# Patient Record
Sex: Female | Born: 1935 | Race: White | Hispanic: No | Marital: Married | Smoking: Former smoker
Health system: Southern US, Community
[De-identification: ages and names within clinical notes are randomized; demographics above are authoritative.]

## PROBLEM LIST (undated history)

## (undated) DIAGNOSIS — I639 Cerebral infarction, unspecified: Secondary | ICD-10-CM

## (undated) DIAGNOSIS — M25512 Pain in left shoulder: Secondary | ICD-10-CM

## (undated) DIAGNOSIS — I779 Disorder of arteries and arterioles, unspecified: Secondary | ICD-10-CM

## (undated) DIAGNOSIS — I10 Essential (primary) hypertension: Secondary | ICD-10-CM

## (undated) DIAGNOSIS — R7301 Impaired fasting glucose: Secondary | ICD-10-CM

## (undated) DIAGNOSIS — Z7901 Long term (current) use of anticoagulants: Secondary | ICD-10-CM

## (undated) DIAGNOSIS — R0600 Dyspnea, unspecified: Secondary | ICD-10-CM

## (undated) DIAGNOSIS — R296 Repeated falls: Secondary | ICD-10-CM

## (undated) DIAGNOSIS — K449 Diaphragmatic hernia without obstruction or gangrene: Secondary | ICD-10-CM

## (undated) DIAGNOSIS — M25511 Pain in right shoulder: Secondary | ICD-10-CM

## (undated) DIAGNOSIS — I38 Endocarditis, valve unspecified: Secondary | ICD-10-CM

## (undated) DIAGNOSIS — R55 Syncope and collapse: Secondary | ICD-10-CM

## (undated) DIAGNOSIS — I700 Atherosclerosis of aorta: Secondary | ICD-10-CM

## (undated) DIAGNOSIS — E785 Hyperlipidemia, unspecified: Secondary | ICD-10-CM

## (undated) DIAGNOSIS — R0609 Other forms of dyspnea: Secondary | ICD-10-CM

## (undated) DIAGNOSIS — K5900 Constipation, unspecified: Secondary | ICD-10-CM

## (undated) DIAGNOSIS — E042 Nontoxic multinodular goiter: Secondary | ICD-10-CM

## (undated) DIAGNOSIS — I8393 Asymptomatic varicose veins of bilateral lower extremities: Secondary | ICD-10-CM

## (undated) DIAGNOSIS — I2510 Atherosclerotic heart disease of native coronary artery without angina pectoris: Secondary | ICD-10-CM

## (undated) DIAGNOSIS — K219 Gastro-esophageal reflux disease without esophagitis: Secondary | ICD-10-CM

## (undated) DIAGNOSIS — Z8719 Personal history of other diseases of the digestive system: Secondary | ICD-10-CM

## (undated) DIAGNOSIS — I4891 Unspecified atrial fibrillation: Secondary | ICD-10-CM

## (undated) DIAGNOSIS — K635 Polyp of colon: Secondary | ICD-10-CM

## (undated) DIAGNOSIS — F32A Depression, unspecified: Secondary | ICD-10-CM

## (undated) DIAGNOSIS — M1990 Unspecified osteoarthritis, unspecified site: Secondary | ICD-10-CM

## (undated) DIAGNOSIS — W19XXXA Unspecified fall, initial encounter: Secondary | ICD-10-CM

## (undated) HISTORY — PX: TONSILLECTOMY: SUR1361

## (undated) HISTORY — PX: COLONOSCOPY: SHX174

## (undated) HISTORY — PX: TUMOR REMOVAL: SHX12

---

## 2004-11-13 ENCOUNTER — Ambulatory Visit: Payer: Self-pay | Admitting: Obstetrics and Gynecology

## 2004-11-19 ENCOUNTER — Ambulatory Visit: Payer: Self-pay | Admitting: Obstetrics and Gynecology

## 2004-12-25 ENCOUNTER — Ambulatory Visit: Payer: Self-pay | Admitting: Internal Medicine

## 2005-12-05 ENCOUNTER — Ambulatory Visit: Payer: Self-pay | Admitting: Internal Medicine

## 2007-09-03 ENCOUNTER — Ambulatory Visit: Payer: Self-pay | Admitting: Internal Medicine

## 2007-09-08 ENCOUNTER — Ambulatory Visit: Payer: Self-pay | Admitting: Internal Medicine

## 2007-10-01 ENCOUNTER — Ambulatory Visit: Payer: Self-pay | Admitting: Gastroenterology

## 2007-10-20 ENCOUNTER — Ambulatory Visit: Payer: Self-pay | Admitting: Gastroenterology

## 2010-10-18 ENCOUNTER — Inpatient Hospital Stay: Payer: Self-pay | Admitting: Internal Medicine

## 2014-02-15 DIAGNOSIS — K219 Gastro-esophageal reflux disease without esophagitis: Secondary | ICD-10-CM | POA: Insufficient documentation

## 2014-05-04 ENCOUNTER — Emergency Department: Payer: Self-pay | Admitting: Emergency Medicine

## 2014-05-04 LAB — COMPREHENSIVE METABOLIC PANEL
ALT: 28 U/L
Albumin: 3.2 g/dL — ABNORMAL LOW (ref 3.4–5.0)
Alkaline Phosphatase: 97 U/L
Anion Gap: 11 (ref 7–16)
BUN: 17 mg/dL (ref 7–18)
Bilirubin,Total: 0.2 mg/dL (ref 0.2–1.0)
CALCIUM: 8.2 mg/dL — AB (ref 8.5–10.1)
CHLORIDE: 99 mmol/L (ref 98–107)
Co2: 27 mmol/L (ref 21–32)
Creatinine: 0.86 mg/dL (ref 0.60–1.30)
GLUCOSE: 118 mg/dL — AB (ref 65–99)
Osmolality: 276 (ref 275–301)
Potassium: 3.5 mmol/L (ref 3.5–5.1)
SGOT(AST): 24 U/L (ref 15–37)
Sodium: 137 mmol/L (ref 136–145)
TOTAL PROTEIN: 5.9 g/dL — AB (ref 6.4–8.2)

## 2014-05-04 LAB — CBC WITH DIFFERENTIAL/PLATELET
Basophil #: 0 10*3/uL (ref 0.0–0.1)
Basophil %: 0.7 %
Eosinophil #: 0.1 10*3/uL (ref 0.0–0.7)
Eosinophil %: 1.8 %
HCT: 31.4 % — ABNORMAL LOW (ref 35.0–47.0)
HGB: 10.7 g/dL — ABNORMAL LOW (ref 12.0–16.0)
LYMPHS PCT: 38.1 %
Lymphocyte #: 1.8 10*3/uL (ref 1.0–3.6)
MCH: 29.5 pg (ref 26.0–34.0)
MCHC: 34 g/dL (ref 32.0–36.0)
MCV: 87 fL (ref 80–100)
MONO ABS: 0.5 x10 3/mm (ref 0.2–0.9)
Monocyte %: 11.1 %
Neutrophil #: 2.2 10*3/uL (ref 1.4–6.5)
Neutrophil %: 48.3 %
Platelet: 206 10*3/uL (ref 150–440)
RBC: 3.61 10*6/uL — ABNORMAL LOW (ref 3.80–5.20)
RDW: 12.8 % (ref 11.5–14.5)
WBC: 4.7 10*3/uL (ref 3.6–11.0)

## 2014-05-04 LAB — PROTIME-INR
INR: 1
Prothrombin Time: 12.9 secs (ref 11.5–14.7)

## 2014-05-04 LAB — TROPONIN I
TROPONIN-I: 0.07 ng/mL — AB
Troponin-I: 0.07 ng/mL — ABNORMAL HIGH

## 2016-03-16 DIAGNOSIS — R55 Syncope and collapse: Secondary | ICD-10-CM

## 2016-03-16 HISTORY — DX: Syncope and collapse: R55

## 2016-03-31 ENCOUNTER — Emergency Department: Payer: Medicare Other

## 2016-03-31 ENCOUNTER — Observation Stay
Admission: EM | Admit: 2016-03-31 | Discharge: 2016-04-03 | Disposition: A | Discharge status: Home / Self Care | Payer: Medicare Other | Attending: Internal Medicine | Admitting: Internal Medicine

## 2016-03-31 ENCOUNTER — Encounter: Payer: Self-pay | Admitting: Intensive Care

## 2016-03-31 DIAGNOSIS — K449 Diaphragmatic hernia without obstruction or gangrene: Secondary | ICD-10-CM | POA: Insufficient documentation

## 2016-03-31 DIAGNOSIS — M50323 Other cervical disc degeneration at C6-C7 level: Secondary | ICD-10-CM | POA: Diagnosis not present

## 2016-03-31 DIAGNOSIS — S60221A Contusion of right hand, initial encounter: Secondary | ICD-10-CM | POA: Diagnosis not present

## 2016-03-31 DIAGNOSIS — E041 Nontoxic single thyroid nodule: Secondary | ICD-10-CM | POA: Insufficient documentation

## 2016-03-31 DIAGNOSIS — K219 Gastro-esophageal reflux disease without esophagitis: Secondary | ICD-10-CM | POA: Diagnosis not present

## 2016-03-31 DIAGNOSIS — Z880 Allergy status to penicillin: Secondary | ICD-10-CM | POA: Diagnosis not present

## 2016-03-31 DIAGNOSIS — S0990XA Unspecified injury of head, initial encounter: Secondary | ICD-10-CM

## 2016-03-31 DIAGNOSIS — R55 Syncope and collapse: Secondary | ICD-10-CM | POA: Diagnosis present

## 2016-03-31 DIAGNOSIS — R4701 Aphasia: Secondary | ICD-10-CM | POA: Diagnosis not present

## 2016-03-31 DIAGNOSIS — M8580 Other specified disorders of bone density and structure, unspecified site: Secondary | ICD-10-CM | POA: Diagnosis not present

## 2016-03-31 DIAGNOSIS — I160 Hypertensive urgency: Secondary | ICD-10-CM | POA: Diagnosis present

## 2016-03-31 DIAGNOSIS — M50321 Other cervical disc degeneration at C4-C5 level: Secondary | ICD-10-CM | POA: Insufficient documentation

## 2016-03-31 DIAGNOSIS — Z888 Allergy status to other drugs, medicaments and biological substances status: Secondary | ICD-10-CM | POA: Diagnosis not present

## 2016-03-31 DIAGNOSIS — S0181XA Laceration without foreign body of other part of head, initial encounter: Secondary | ICD-10-CM

## 2016-03-31 DIAGNOSIS — W19XXXA Unspecified fall, initial encounter: Secondary | ICD-10-CM | POA: Insufficient documentation

## 2016-03-31 DIAGNOSIS — Y939 Activity, unspecified: Secondary | ICD-10-CM | POA: Insufficient documentation

## 2016-03-31 DIAGNOSIS — I517 Cardiomegaly: Secondary | ICD-10-CM | POA: Insufficient documentation

## 2016-03-31 DIAGNOSIS — M50322 Other cervical disc degeneration at C5-C6 level: Secondary | ICD-10-CM | POA: Diagnosis not present

## 2016-03-31 DIAGNOSIS — I959 Hypotension, unspecified: Secondary | ICD-10-CM | POA: Diagnosis present

## 2016-03-31 DIAGNOSIS — J320 Chronic maxillary sinusitis: Secondary | ICD-10-CM | POA: Insufficient documentation

## 2016-03-31 DIAGNOSIS — Y9289 Other specified places as the place of occurrence of the external cause: Secondary | ICD-10-CM | POA: Insufficient documentation

## 2016-03-31 DIAGNOSIS — M19012 Primary osteoarthritis, left shoulder: Secondary | ICD-10-CM | POA: Insufficient documentation

## 2016-03-31 HISTORY — DX: Essential (primary) hypertension: I10

## 2016-03-31 HISTORY — DX: Gastro-esophageal reflux disease without esophagitis: K21.9

## 2016-03-31 LAB — BASIC METABOLIC PANEL
ANION GAP: 7 (ref 5–15)
BUN: 19 mg/dL (ref 6–20)
CALCIUM: 9.3 mg/dL (ref 8.9–10.3)
CO2: 25 mmol/L (ref 22–32)
CREATININE: 0.58 mg/dL (ref 0.44–1.00)
Chloride: 102 mmol/L (ref 101–111)
GLUCOSE: 100 mg/dL — AB (ref 65–99)
Potassium: 3.6 mmol/L (ref 3.5–5.1)
Sodium: 134 mmol/L — ABNORMAL LOW (ref 135–145)

## 2016-03-31 LAB — CBC
HCT: 35.1 % (ref 35.0–47.0)
Hemoglobin: 12.3 g/dL (ref 12.0–16.0)
MCH: 29.9 pg (ref 26.0–34.0)
MCHC: 35.1 g/dL (ref 32.0–36.0)
MCV: 85.2 fL (ref 80.0–100.0)
PLATELETS: 209 10*3/uL (ref 150–440)
RBC: 4.12 MIL/uL (ref 3.80–5.20)
RDW: 13 % (ref 11.5–14.5)
WBC: 7.1 10*3/uL (ref 3.6–11.0)

## 2016-03-31 LAB — TROPONIN I
TROPONIN I: 0.03 ng/mL — AB (ref <0.03)
Troponin I: <0.03 ng/mL (ref <0.03)

## 2016-03-31 MED ORDER — HYDRALAZINE HCL 10 MG PO TABS
10.0000 mg | ORAL_TABLET | Freq: Three times a day (TID) | ORAL | Status: DC
  Administered 2016-03-31 (×2): 10 mg via ORAL
  Filled 2016-03-31 (×3): qty 1

## 2016-03-31 MED ORDER — SODIUM CHLORIDE 0.9% FLUSH
3.0000 mL | Freq: Two times a day (BID) | INTRAVENOUS | Status: DC
  Administered 2016-03-31 – 2016-04-03 (×6): 3 mL via INTRAVENOUS

## 2016-03-31 MED ORDER — ENOXAPARIN SODIUM 40 MG/0.4ML ~~LOC~~ SOLN
40.0000 mg | SUBCUTANEOUS | Status: DC
  Administered 2016-03-31 – 2016-04-02 (×3): 40 mg via SUBCUTANEOUS
  Filled 2016-03-31 (×3): qty 0.4

## 2016-03-31 MED ORDER — LISINOPRIL 20 MG PO TABS
40.0000 mg | ORAL_TABLET | Freq: Every day | ORAL | Status: DC
  Administered 2016-04-01 – 2016-04-03 (×3): 40 mg via ORAL
  Filled 2016-03-31 (×3): qty 2

## 2016-03-31 MED ORDER — TETANUS-DIPHTH-ACELL PERTUSSIS 5-2.5-18.5 LF-MCG/0.5 IM SUSP
0.5000 mL | Freq: Once | INTRAMUSCULAR | Status: AC
  Administered 2016-03-31: 0.5 mL via INTRAMUSCULAR
  Filled 2016-03-31: qty 0.5

## 2016-03-31 MED ORDER — LIDOCAINE-EPINEPHRINE 2 %-1:100000 IJ SOLN
INTRAMUSCULAR | Status: AC
  Administered 2016-03-31: 15:00:00
  Filled 2016-03-31: qty 1.7

## 2016-03-31 MED ORDER — ACETAMINOPHEN 325 MG PO TABS
650.0000 mg | ORAL_TABLET | Freq: Four times a day (QID) | ORAL | Status: DC | PRN
  Administered 2016-03-31 – 2016-04-02 (×7): 650 mg via ORAL
  Filled 2016-03-31 (×7): qty 2

## 2016-03-31 MED ORDER — ASPIRIN 81 MG PO CHEW
324.0000 mg | CHEWABLE_TABLET | Freq: Once | ORAL | Status: AC
  Administered 2016-03-31: 324 mg via ORAL
  Filled 2016-03-31: qty 4

## 2016-03-31 MED ORDER — OXYCODONE HCL 5 MG PO TABS: 5.0000 mg | ORAL_TABLET | ORAL | Status: DC | PRN

## 2016-03-31 MED ORDER — HYDRALAZINE HCL 20 MG/ML IJ SOLN
10.0000 mg | Freq: Once | INTRAMUSCULAR | Status: AC
  Administered 2016-03-31: 10 mg via INTRAVENOUS
  Filled 2016-03-31: qty 1

## 2016-03-31 MED ORDER — ATENOLOL 25 MG PO TABS
50.0000 mg | ORAL_TABLET | Freq: Every day | ORAL | Status: DC
  Administered 2016-04-01: 50 mg via ORAL
  Filled 2016-03-31: qty 2

## 2016-03-31 MED ORDER — ONDANSETRON HCL 4 MG PO TABS: 4.0000 mg | ORAL_TABLET | Freq: Four times a day (QID) | ORAL | Status: DC | PRN

## 2016-03-31 MED ORDER — LABETALOL HCL 5 MG/ML IV SOLN: 5.0000 mg | INTRAVENOUS | Status: DC | PRN

## 2016-03-31 MED ORDER — SODIUM CHLORIDE 0.9 % IV BOLUS (SEPSIS)
1000.0000 mL | Freq: Once | INTRAVENOUS | Status: AC
  Administered 2016-03-31: 1000 mL via INTRAVENOUS

## 2016-03-31 MED ORDER — LISINOPRIL 10 MG PO TABS
20.0000 mg | ORAL_TABLET | Freq: Once | ORAL | Status: AC
  Administered 2016-03-31: 20 mg via ORAL
  Filled 2016-03-31: qty 2

## 2016-03-31 MED ORDER — ONDANSETRON HCL 4 MG/2ML IJ SOLN: 4.0000 mg | Freq: Four times a day (QID) | INTRAMUSCULAR | Status: DC | PRN

## 2016-03-31 MED ORDER — LIDOCAINE-EPINEPHRINE (PF) 1 %-1:200000 IJ SOLN
INTRAMUSCULAR | Status: AC
  Administered 2016-03-31: 15:00:00
  Filled 2016-03-31: qty 30

## 2016-03-31 MED ORDER — SODIUM CHLORIDE 0.9 % IV SOLN
INTRAVENOUS | Status: DC
  Administered 2016-03-31 – 2016-04-01 (×2): via INTRAVENOUS

## 2016-03-31 NOTE — Care Management Obs Status (Signed)
MEDICARE OBSERVATION STATUS NOTIFICATION   Patient Details  Name: Debbie BalesCarol Dahmer MRN: [id number] Date of Birth: Apr 19, 1932   Medicare Observation Status Notification Given:  Yes    Caren MacadamMichelle Ilee Randleman, RN 03/31/2016, 4:52 PM

## 2016-03-31 NOTE — ED Notes (Addendum)
Patient [place] by EMS from church. Patient was at church, slipped and fell, landing on her L arm and chin. Patient c/o L arm pain, chin pain, and R thumb pain. Patient is A& O x4. EMS vitals 243/105 b/p, 125 CBG

## 2016-03-31 NOTE — H&P (Signed)
[facility] [place] [facility]   PATIENT NAME: Debbie Hampton    MR#:  [id number]  DATE OF BIRTH:  Feb 01, 1932   DATE OF ADMISSION:  03/31/2016  PRIMARY CARE PHYSICIAN: BABAOFF, Lavada Mesi, MD   REQUESTING/REFERRING PHYSICIAN: Quale  CHIEF COMPLAINT:   Chief Complaint  Patient presents with  . Fall  . Hypertension    HISTORY OF PRESENT ILLNESS:  Debbie Hampton  is a 82 y.o. female with a known history of Essential hypertension stating Baseline blood pressure SBP 140 who is presenting after fall. She states that she was at church on suffered a mechanical fall hitting the left side of her chin causing a laceration thus presented to Hospital further workup and evaluation. On EMS arrival noted marked hypertension with systolic blood pressure in the 260 range. Her blood pressure remained elevated in the emergency department where she received 10 mg IV hydralazine in addition to 20 mg oral lisinopril (in addition to all of her home medications taken this morning at 7 AM) her blood pressure plummeted to a systolic pressure of 80. With wild blood pressure swings the patient developed transient aphasia. Now improved.   PAST MEDICAL HISTORY:   Past Medical History  Diagnosis Date  . Hypertension   . GERD (gastroesophageal reflux disease)     PAST SURGICAL HISTORY:   Past Surgical History  Procedure Laterality Date  . Tonsillectomy    . Tumor removal      ovaries    SOCIAL HISTORY:   Social History  Substance Use Topics  . Smoking status: Never Smoker   . Smokeless tobacco: Never Used  . Alcohol Use: No    FAMILY HISTORY:   Family History  Problem Relation Age of Onset  . Hypertension Other     DRUG ALLERGIES:   Allergies  Allergen Reactions  . Aloe   . Penicillins     REVIEW OF SYSTEMS:  REVIEW OF SYSTEMS:  CONSTITUTIONAL: Denies fevers, chills, fatigue, weakness.  EYES: Denies blurred vision, double vision, or eye pain.  EARS, NOSE, THROAT:  Denies tinnitus, ear pain, hearing loss.  RESPIRATORY: denies cough, shortness of breath, wheezing  CARDIOVASCULAR: Denies chest pain, palpitations, edema.  GASTROINTESTINAL: Denies nausea, vomiting, diarrhea, abdominal pain.  GENITOURINARY: Denies dysuria, hematuria.  ENDOCRINE: Denies nocturia or thyroid problems. HEMATOLOGIC AND LYMPHATIC: Denies easy bruising or bleeding.  SKIN: Denies rash or lesions.  MUSCULOSKELETAL: Denies pain in neck, back, shoulder, knees, hips, or further arthritic symptoms.  NEUROLOGIC: Denies paralysis, paresthesias.  PSYCHIATRIC: Denies anxiety or depressive symptoms. Otherwise full review of systems performed by me is negative.   MEDICATIONS AT HOME:   Prior to Admission medications   Medication Sig Start Date End Date Taking? Authorizing Provider  atenolol (TENORMIN) 50 MG tablet Take 50 mg by mouth daily. 02/22/16  Yes Historical Provider, MD  hydrALAZINE (APRESOLINE) 10 MG tablet Take 10 mg by mouth 3 (three) times daily. 03/23/16  Yes Historical Provider, MD  lisinopril (PRINIVIL,ZESTRIL) 40 MG tablet Take 40 mg by mouth daily. 02/22/16  Yes Historical Provider, MD      VITAL SIGNS:  Blood pressure 207/78, pulse 64, temperature 97.9 F (36.6 C), temperature source Oral, resp. rate 27, height  (1.575 m), weight 134 lb (60.782 kg), SpO2 100 %.  PHYSICAL EXAMINATION:  VITAL SIGNS: Filed Vitals:   03/31/16 1515 03/31/16 1530  BP: 202/72 207/78  Pulse:    Temp:    Resp: 22 27   GENERAL:83 y.o.female currently in  no acute distress.  HEAD: Normocephalic, atraumatic.  EYES: Pupils equal, round, reactive to light. Extraocular muscles intact. No scleral icterus.  MOUTH: Moist mucosal membrane. Dentition intact. No abscess noted.  EAR, NOSE, THROAT: Clear without exudates. No external lesions.  NECK: Supple. No thyromegaly. No nodules. No JVD.  PULMONARY: Clear to ascultation, without wheeze rails or rhonci. No use of accessory muscles, Good  respiratory effort. good air entry bilaterally CHEST: Nontender to palpation.  CARDIOVASCULAR: S1 and S2. Regular rate and rhythm. No murmurs, rubs, or gallops. No edema. Pedal pulses 2+ bilaterally.  GASTROINTESTINAL: Soft, nontender, nondistended. No masses. Positive bowel sounds. No hepatosplenomegaly.  MUSCULOSKELETAL: No swelling, clubbing, or edema. Range of motion full in all extremities.  NEUROLOGIC: Cranial nerves II through XII are intact. No gross focal neurological deficits. Sensation intact. Reflexes intact.  SKIN: small left chin laceration post repair, otherwise no ulceration, lesions, rashes, or cyanosis. Skin warm and dry. Turgor intact.  PSYCHIATRIC: Mood, affect within normal limits. The patient is awake, alert and oriented x 3. Insight, judgment intact.    LABORATORY PANEL:   CBC  Recent Labs Lab 03/31/16 1228  WBC 7.1  HGB 12.3  HCT 35.1  PLT 209   ------------------------------------------------------------------------------------------------------------------  Chemistries   Recent Labs Lab 03/31/16 1228  NA 134*  K 3.6  CL 102  CO2 25  GLUCOSE 100*  BUN 19  CREATININE 0.58  CALCIUM 9.3   ------------------------------------------------------------------------------------------------------------------  Cardiac Enzymes  Recent Labs Lab 03/31/16 1228  TROPONINI <0.03   ------------------------------------------------------------------------------------------------------------------  RADIOLOGY:  Dg Chest 2 View  03/31/2016  CLINICAL DATA:  Pt fell this am; now with right hand pain around 1st metacarpal and 1st digit; pt also with c/o left shoulder pain; pt unable to abduct left arm; hx/ HTN and is a nonsmoker EXAM: CHEST  2 VIEW COMPARISON:  10/17/2010 FINDINGS: The heart is mildly enlarged. The lungs are free of focal consolidations and pleural effusions. No pulmonary edema. There is a hiatal hernia. No acute vertebral injury. Chronic changes  noted in both shoulders. IMPRESSION: Cardiomegaly.  No edema. Hiatal hernia. Electronically Signed   By: Norva PavlovElizabeth  Brown M.D.   On: 03/31/2016 14:05   Ct Head Wo Contrast  03/31/2016  CLINICAL DATA:  82 year old female with acute head, face and cervical spine pain following fall today. Initial encounter. EXAM: CT HEAD WITHOUT CONTRAST CT MAXILLOFACIAL WITHOUT CONTRAST CT CERVICAL SPINE WITHOUT CONTRAST TECHNIQUE: Multidetector CT imaging of the head, cervical spine, and maxillofacial structures were performed using the standard protocol without intravenous contrast. Multiplanar CT image reconstructions of the cervical spine and maxillofacial structures were also generated. COMPARISON:  12/25/2004 chest CT FINDINGS: CT HEAD FINDINGS Chronic small-vessel white matter ischemic changes and remote right caudate infarct identified. No acute intracranial abnormalities are identified, including mass lesion or mass effect, hydrocephalus, extra-axial fluid collection, midline shift, hemorrhage, or acute infarction. The visualized bony calvarium is unremarkable. CT MAXILLOFACIAL FINDINGS There is no evidence of acute fracture, subluxation or dislocation. Soft tissue laceration/injury at the chin noted. The paranasal sinuses, mastoid air cells and middle/inner ears are clear except for left maxillary sinus mucosal thickening. Orbits and globes are unremarkable. CT CERVICAL SPINE FINDINGS There is no evidence of acute fracture, subluxation or prevertebral soft tissue swelling. Multilevel degenerative changes throughout the cervical spine noted with moderate degenerative disc disease, spondylosis and facet arthropathy at C3-4, C4-5, and C6-7. No suspicious focal bony lesions are identified. A 2 cm posterior left thyroid nodule is unchanged from 12/25/2004. IMPRESSION: No evidence  of acute intracranial abnormality. Chronic small-vessel white matter ischemic changes and remote right caudate infarct. Chin soft tissue injury  without evidence of facial fracture. No static evidence of acute injury to the cervical spine. Degenerative changes as described. Chronic left maxillary sinus disease/sinusitis. Electronically Signed   By: Harmon Pier M.D.   On: 03/31/2016 13:59   Ct Cervical Spine Wo Contrast  03/31/2016  CLINICAL DATA:  82 year old female with acute head, face and cervical spine pain following fall today. Initial encounter. EXAM: CT HEAD WITHOUT CONTRAST CT MAXILLOFACIAL WITHOUT CONTRAST CT CERVICAL SPINE WITHOUT CONTRAST TECHNIQUE: Multidetector CT imaging of the head, cervical spine, and maxillofacial structures were performed using the standard protocol without intravenous contrast. Multiplanar CT image reconstructions of the cervical spine and maxillofacial structures were also generated. COMPARISON:  12/25/2004 chest CT FINDINGS: CT HEAD FINDINGS Chronic small-vessel white matter ischemic changes and remote right caudate infarct identified. No acute intracranial abnormalities are identified, including mass lesion or mass effect, hydrocephalus, extra-axial fluid collection, midline shift, hemorrhage, or acute infarction. The visualized bony calvarium is unremarkable. CT MAXILLOFACIAL FINDINGS There is no evidence of acute fracture, subluxation or dislocation. Soft tissue laceration/injury at the chin noted. The paranasal sinuses, mastoid air cells and middle/inner ears are clear except for left maxillary sinus mucosal thickening. Orbits and globes are unremarkable. CT CERVICAL SPINE FINDINGS There is no evidence of acute fracture, subluxation or prevertebral soft tissue swelling. Multilevel degenerative changes throughout the cervical spine noted with moderate degenerative disc disease, spondylosis and facet arthropathy at C3-4, C4-5, and C6-7. No suspicious focal bony lesions are identified. A 2 cm posterior left thyroid nodule is unchanged from 12/25/2004. IMPRESSION: No evidence of acute intracranial abnormality. Chronic  small-vessel white matter ischemic changes and remote right caudate infarct. Chin soft tissue injury without evidence of facial fracture. No static evidence of acute injury to the cervical spine. Degenerative changes as described. Chronic left maxillary sinus disease/sinusitis. Electronically Signed   By: Harmon Pier M.D.   On: 03/31/2016 13:59   Dg Shoulder Left  03/31/2016  CLINICAL DATA:  Status post fall.  Left shoulder pain. EXAM: LEFT SHOULDER - 2+ VIEW COMPARISON:  None. FINDINGS: No acute fracture or dislocation. Severe osteoarthritis of the left glenohumeral joint. Loss of the normal acromiohumeral distance as can be seen with a chronic rotator cuff tear. Generalized osteopenia. Mild degenerative changes of the acromioclavicular joint. Slight irregularity of the distal clavicle which is likely projectional. IMPRESSION: 1. Slight irregularity of the distal clavicle which is likely projectional. Correlate with point tenderness. If there is tenderness over this region, recommend a dedicated x-ray of the left clavicle. 2. Otherwise no acute osseous injury of the left shoulder. 3. Severe osteoarthritis of the left glenohumeral joint. Electronically Signed   By: Elige Ko   On: 03/31/2016 14:09   Dg Hand Complete Right  03/31/2016  CLINICAL DATA:  Fall. Right hand pain around first metacarpal bone and first digit. EXAM: RIGHT HAND - COMPLETE 3+ VIEW COMPARISON:  None. FINDINGS: The bones appear diffusely osteopenic. There is no evidence of fracture or dislocation. Moderate osteoarthritis is noted involving the basilar joint. Soft tissues are unremarkable. IMPRESSION: 1. No fractures. 2. Osteopenia. Electronically Signed   By: Signa Kell M.D.   On: 03/31/2016 14:09   Ct Maxillofacial Wo Cm  03/31/2016  CLINICAL DATA:  82 year old female with acute head, face and cervical spine pain following fall today. Initial encounter. EXAM: CT HEAD WITHOUT CONTRAST CT MAXILLOFACIAL WITHOUT CONTRAST CT CERVICAL  SPINE WITHOUT CONTRAST TECHNIQUE: Multidetector CT imaging of the head, cervical spine, and maxillofacial structures were performed using the standard protocol without intravenous contrast. Multiplanar CT image reconstructions of the cervical spine and maxillofacial structures were also generated. COMPARISON:  12/25/2004 chest CT FINDINGS: CT HEAD FINDINGS Chronic small-vessel white matter ischemic changes and remote right caudate infarct identified. No acute intracranial abnormalities are identified, including mass lesion or mass effect, hydrocephalus, extra-axial fluid collection, midline shift, hemorrhage, or acute infarction. The visualized bony calvarium is unremarkable. CT MAXILLOFACIAL FINDINGS There is no evidence of acute fracture, subluxation or dislocation. Soft tissue laceration/injury at the chin noted. The paranasal sinuses, mastoid air cells and middle/inner ears are clear except for left maxillary sinus mucosal thickening. Orbits and globes are unremarkable. CT CERVICAL SPINE FINDINGS There is no evidence of acute fracture, subluxation or prevertebral soft tissue swelling. Multilevel degenerative changes throughout the cervical spine noted with moderate degenerative disc disease, spondylosis and facet arthropathy at C3-4, C4-5, and C6-7. No suspicious focal bony lesions are identified. A 2 cm posterior left thyroid nodule is unchanged from 12/25/2004. IMPRESSION: No evidence of acute intracranial abnormality. Chronic small-vessel white matter ischemic changes and remote right caudate infarct. Chin soft tissue injury without evidence of facial fracture. No static evidence of acute injury to the cervical spine. Degenerative changes as described. Chronic left maxillary sinus disease/sinusitis. Electronically Signed   By: Harmon Pier M.D.   On: 03/31/2016 13:59    EKG:   Orders placed or performed during the hospital encounter of 03/31/16  . ED EKG  . ED EKG  . EKG 12-Lead  . EKG 12-Lead     IMPRESSION AND PLAN:   82 year old Caucasian female history of essential hypertension presenting after mechanical fall and had blood pressure swing with aphasia symptoms  1. Hypertensive urgency: continue home medications, would avoid IV hydralazine , since questionable baseline blood pressure would aim for systolic around 200 2. Near syncope: Secondary to blood pressure changes Place on telemetry, follow blood pressure parameters as above 3. Venous thromboembolism prophylactic: lovenox    All the records are reviewed and case discussed with ED provider. Management plans discussed with the patient, family and they are in agreement.  CODE STATUS: full  TOTAL TIME TAKING CARE OF THIS PATIENT: 33 minutes.    Sharmila Wrobleski,  Mardi Mainland.D on 03/31/2016 at 3:54 PM  Between 7am to 6pm - Pager - [phone]  After 6pm: House Pager: - [phone]  [facility] [place] [facility]  Office  [phone]  CC: Primary care physician; BABAOFF, Lavada Mesi, MD

## 2016-03-31 NOTE — ED Notes (Signed)
Patient back from CT and DG. Patient is experiencing aphasia. MD made aware/ B/p checked once returned and it read 80/45 at 1419. Patient placed in trendelenburg and given a normal saline bolus. Blood pressure at 1424 114/50 and patient is speaking normal again. MD still at bedside

## 2016-03-31 NOTE — ED Notes (Signed)
Patient transported to X-ray 

## 2016-03-31 NOTE — ED Notes (Signed)
MD Hower at bedside. 

## 2016-03-31 NOTE — ED Provider Notes (Signed)
[facility] Emergency Department Provider Note  ____________________________________________  Time seen: Approximately 12:19 PM  I have reviewed the triage vital signs and the nursing notes.   HISTORY  Chief Complaint Fall and Hypertension    HPI Debbie Hampton is a 82 y.o. female presents for evaluation after falling at church. She reports she was walking into church, and then the next thing she knew the "floor came up and hit me". She doesn't recall exactly why, but she thinks she may have tripped up on her shoes. She fell forward and struck her chin.  Denies any chest pain or headache, she does report having some mild tenderness along left side of the neck. She also reports that her right thumb is bruised but able to use it well without any numbness or weakness. No numbness weakness or tingling in her arms or legs.  Race moderate discomfort across the front of the chin with a laceration that bled some but is stopped now.  Does not think her tetanus is up-to-date  Past Medical History  Diagnosis Date  . Hypertension   . GERD (gastroesophageal reflux disease)     Patient Active Problem List   Diagnosis Date Noted  . Syncope, near 03/31/2016    Past Surgical History  Procedure Laterality Date  . Tonsillectomy    . Tumor removal      ovaries    Current Outpatient Rx  Name  Route  Sig  Dispense  Refill  . atenolol (TENORMIN) 50 MG tablet   Oral   Take 50 mg by mouth daily.      3   . hydrALAZINE (APRESOLINE) 10 MG tablet   Oral   Take 10 mg by mouth 3 (three) times daily.      5   . lisinopril (PRINIVIL,ZESTRIL) 40 MG tablet   Oral   Take 40 mg by mouth daily.      3     Allergies Aloe and Penicillins  Family History  Problem Relation Age of Onset  . Hypertension Other     Social History Social History  Substance Use Topics  . Smoking status: Never Smoker   . Smokeless tobacco: Never Used  . Alcohol Use: No    Review  of Systems Constitutional: No fever/chills Eyes: No visual changes. ENT: No sore throat.Does report her chin is sore. No loose teeth. Cardiovascular: Denies chest pain. Respiratory: Denies shortness of breath. Gastrointestinal: No abdominal pain.  No nausea, no vomiting.  No diarrhea.  No constipation. Genitourinary: Negative for dysuria. Musculoskeletal: Negative for back pain. She reports that her right thumb is slightly swollen, but otherwise working well. The outside her left shoulder feels a little bit sore, but she is able to use it well. Skin: Negative for rash. Neurological: Negative for headaches, focal weakness or numbness.  10-point ROS otherwise negative.  ____________________________________________   PHYSICAL EXAM:  VITAL SIGNS: ED Triage Vitals  Enc Vitals Group     BP 03/31/16 1059 263/104 mmHg     Pulse Rate 03/31/16 1059 64     Resp 03/31/16 1059 20     Temp 03/31/16 1059 97.9 F (36.6 C)     Temp Source 03/31/16 1059 Oral     SpO2 03/31/16 1059 98 %     Weight 03/31/16 1059 134 lb (60.782 kg)     Height 03/31/16 1059  (1.575 m)     Head Cir --      Peak Flow --  Pain Score 03/31/16 1103 4     Pain Loc --      Pain Edu? --      Excl. in GC? --    Constitutional: Alert and oriented. Well appearing and in no acute distress.Pleasant. Eyes: Conjunctivae are normal. PERRL. EOMI. Head: Atraumatic except for a proximally 2 cm laceration involving the anterior aspect of the chin, well controlled and clean. No dental injury. Nose: No congestion/rhinnorhea. Mouth/Throat: Mucous membranes are moist.  Oropharynx non-erythematous. Neck: No stridor.  Mild tenderness along the lateral aspect of the left cervical spine, but no midline tenderness noted. No thoracic or lumbar tenderness. No injury noted to the back. Cardiovascular: Normal rate, regular rhythm. Grossly normal heart sounds.  Good peripheral circulation. Respiratory: Normal respiratory effort.  No  retractions. Lungs CTAB. Gastrointestinal: Soft and nontender. No distention. No abdominal bruits. No CVA tenderness.  Lower Extremities  No edema. Normal DP/PT pulses bilateral with good cap refill.  Normal neuro-motor function lower extremities bilateral.  RIGHT Right lower extremity demonstrates normal strength, good use of all muscles. No edema bruising or contusions of the right hip, right knee, right ankle. Full range of motion of the right lower extremity without pain. No pain on axial loading. No evidence of trauma.  LEFT Left lower extremity demonstrates normal strength, good use of all muscles. No edema bruising or contusions of the hip,  knee, ankle. Full range of motion of the left lower extremity without pain. No pain on axial loading. No evidence of trauma.  RIGHT Right upper extremity demonstrates normal strength, good use of all muscles. No edema bruising or contusions of the right shoulder/upper arm, right elbow, right forearm / hand although the first digit on the right hand has mild ecchymoses at the distal tip with no open laceration or wound noted. Full range of motion of the right right upper extremity without pain. No evidence of trauma. Strong radial pulse. Intact median/ulnar/radial neuro-muscular exam.  LEFT Left upper extremity demonstrates normal strength, good use of all muscles. No edema bruising or contusions of the left shoulder/upper arm, left elbow, left forearm / hand. Full range of motion of the left  upper extremity without pain. No evidence of trauma. Strong radial pulse. Intact median/ulnar/radial neuro-muscular exam.   Musculoskeletal: No lower extremity tenderness nor edema.  No joint effusions. Neurologic:  Normal speech and language. No gross focal neurologic deficits are appreciated. No gait instability. Skin:  Skin is warm, dry and intact. No rash noted. Psychiatric: Mood and affect are normal. Speech and behavior are  normal.  ____________________________________________   LABS (all labs ordered are listed, but only abnormal results are displayed)  Labs Reviewed  BASIC METABOLIC PANEL - Abnormal; Notable for the following:    Sodium 134 (*)    Glucose, Bld 100 (*)    All other components within normal limits  TROPONIN I  CBC   ____________________________________________  EKG  Reviewed and interpreted by me at 1500 Normal sinus rhythm, heart rate 65 Appears likely consistent with LVH, there is T-wave abnormality, but likely that of a LVH type pattern. There is no clear evidence ischemic abnormality. PR 60 QTc 450  EKG reviewed and interpreted 11:10 AM Normal sinus rhythm QRS 100 QTc 450 Normal sinus rhythm, probable left ventricular hypertrophy. No clear evidence ischemic abnormality. Of note the patient not have any chest pain or pulmonary symptoms at this time. ____________________________________________  RADIOLOGY   DG Chest 2 View (Final result) Result time: 03/31/16 14:05:56   Final  result by Rad Results In Interface (03/31/16 14:05:56)   Narrative:   CLINICAL DATA: Pt fell this am; now with right hand pain around 1st metacarpal and 1st digit; pt also with c/o left shoulder pain; pt unable to abduct left arm; hx/ HTN and is a nonsmoker  EXAM: CHEST 2 VIEW  COMPARISON: 10/17/2010  FINDINGS: The heart is mildly enlarged. The lungs are free of focal consolidations and pleural effusions. No pulmonary edema. There is a hiatal hernia. No acute vertebral injury. Chronic changes noted in both shoulders.  IMPRESSION: Cardiomegaly. No edema.  Hiatal hernia.   Electronically Signed By: Norva Pavlov M.D. On: 03/31/2016 14:05          DG Shoulder Left (Final result) Result time: 03/31/16 14:09:04   Final result by Rad Results In Interface (03/31/16 14:09:04)   Narrative:   CLINICAL DATA: Status post fall. Left shoulder pain.  EXAM: LEFT SHOULDER  - 2+ VIEW  COMPARISON: None.  FINDINGS: No acute fracture or dislocation. Severe osteoarthritis of the left glenohumeral joint. Loss of the normal acromiohumeral distance as can be seen with a chronic rotator cuff tear.  Generalized osteopenia.  Mild degenerative changes of the acromioclavicular joint. Slight irregularity of the distal clavicle which is likely projectional.  IMPRESSION: 1. Slight irregularity of the distal clavicle which is likely projectional. Correlate with point tenderness. If there is tenderness over this region, recommend a dedicated x-ray of the left clavicle. 2. Otherwise no acute osseous injury of the left shoulder. 3. Severe osteoarthritis of the left glenohumeral joint.   Electronically Signed By: Elige Ko On: 03/31/2016 14:09          DG Hand Complete Right (Final result) Result time: 03/31/16 14:09:12   Final result by Rad Results In Interface (03/31/16 14:09:12)   Narrative:   CLINICAL DATA: Fall. Right hand pain around first metacarpal bone and first digit.  EXAM: RIGHT HAND - COMPLETE 3+ VIEW  COMPARISON: None.  FINDINGS: The bones appear diffusely osteopenic. There is no evidence of fracture or dislocation. Moderate osteoarthritis is noted involving the basilar joint. Soft tissues are unremarkable.  IMPRESSION: 1. No fractures. 2. Osteopenia.   Electronically Signed By: Signa Kell M.D. On: 03/31/2016 14:09          CT Head Wo Contrast (Final result) Result time: 03/31/16 13:59:55   Final result by Rad Results In Interface (03/31/16 13:59:55)   Narrative:   CLINICAL DATA: 82 year old female with acute head, face and cervical spine pain following fall today. Initial encounter.  EXAM: CT HEAD WITHOUT CONTRAST  CT MAXILLOFACIAL WITHOUT CONTRAST  CT CERVICAL SPINE WITHOUT CONTRAST  TECHNIQUE: Multidetector CT imaging of the head, cervical spine, and maxillofacial structures were  performed using the standard protocol without intravenous contrast. Multiplanar CT image reconstructions of the cervical spine and maxillofacial structures were also generated.  COMPARISON: 12/25/2004 chest CT  FINDINGS: CT HEAD FINDINGS  Chronic small-vessel white matter ischemic changes and remote right caudate infarct identified.  No acute intracranial abnormalities are identified, including mass lesion or mass effect, hydrocephalus, extra-axial fluid collection, midline shift, hemorrhage, or acute infarction. The visualized bony calvarium is unremarkable.  CT MAXILLOFACIAL FINDINGS  There is no evidence of acute fracture, subluxation or dislocation.  Soft tissue laceration/injury at the chin noted.  The paranasal sinuses, mastoid air cells and middle/inner ears are clear except for left maxillary sinus mucosal thickening.  Orbits and globes are unremarkable.  CT CERVICAL SPINE FINDINGS  There is no evidence of acute fracture, subluxation or  prevertebral soft tissue swelling.  Multilevel degenerative changes throughout the cervical spine noted with moderate degenerative disc disease, spondylosis and facet arthropathy at C3-4, C4-5, and C6-7.  No suspicious focal bony lesions are identified.  A 2 cm posterior left thyroid nodule is unchanged from 12/25/2004.  IMPRESSION: No evidence of acute intracranial abnormality. Chronic small-vessel white matter ischemic changes and remote right caudate infarct.  Chin soft tissue injury without evidence of facial fracture.  No static evidence of acute injury to the cervical spine. Degenerative changes as described.  Chronic left maxillary sinus disease/sinusitis.   Electronically Signed By: Harmon PierJeffrey Hu M.D. On: 03/31/2016 13:59          CT Maxillofacial WO CM (Final result) Result time: 03/31/16 13:59:55   Final result by Rad Results In Interface (03/31/16 13:59:55)   Narrative:   CLINICAL DATA:  82 year old female with acute head, face and cervical spine pain following fall today. Initial encounter.  EXAM: CT HEAD WITHOUT CONTRAST  CT MAXILLOFACIAL WITHOUT CONTRAST  CT CERVICAL SPINE WITHOUT CONTRAST  TECHNIQUE: Multidetector CT imaging of the head, cervical spine, and maxillofacial structures were performed using the standard protocol without intravenous contrast. Multiplanar CT image reconstructions of the cervical spine and maxillofacial structures were also generated.  COMPARISON: 12/25/2004 chest CT  FINDINGS: CT HEAD FINDINGS  Chronic small-vessel white matter ischemic changes and remote right caudate infarct identified.  No acute intracranial abnormalities are identified, including mass lesion or mass effect, hydrocephalus, extra-axial fluid collection, midline shift, hemorrhage, or acute infarction. The visualized bony calvarium is unremarkable.  CT MAXILLOFACIAL FINDINGS  There is no evidence of acute fracture, subluxation or dislocation.  Soft tissue laceration/injury at the chin noted.  The paranasal sinuses, mastoid air cells and middle/inner ears are clear except for left maxillary sinus mucosal thickening.  Orbits and globes are unremarkable.  CT CERVICAL SPINE FINDINGS  There is no evidence of acute fracture, subluxation or prevertebral soft tissue swelling.  Multilevel degenerative changes throughout the cervical spine noted with moderate degenerative disc disease, spondylosis and facet arthropathy at C3-4, C4-5, and C6-7.  No suspicious focal bony lesions are identified.  A 2 cm posterior left thyroid nodule is unchanged from 12/25/2004.  IMPRESSION: No evidence of acute intracranial abnormality. Chronic small-vessel white matter ischemic changes and remote right caudate infarct.  Chin soft tissue injury without evidence of facial fracture.  No static evidence of acute injury to the cervical spine. Degenerative changes as  described.  Chronic left maxillary sinus disease/sinusitis.   Electronically Signed By: Harmon PierJeffrey Hu M.D. On: 03/31/2016 13:59          CT Cervical Spine Wo Contrast (Final result) Result time: 03/31/16 13:59:55   Final result by Rad Results In Interface (03/31/16 13:59:55)   Narrative:   CLINICAL DATA: 82 year old female with acute head, face and cervical spine pain following fall today. Initial encounter.  EXAM: CT HEAD WITHOUT CONTRAST  CT MAXILLOFACIAL WITHOUT CONTRAST  CT CERVICAL SPINE WITHOUT CONTRAST  TECHNIQUE: Multidetector CT imaging of the head, cervical spine, and maxillofacial structures were performed using the standard protocol without intravenous contrast. Multiplanar CT image reconstructions of the cervical spine and maxillofacial structures were also generated.  COMPARISON: 12/25/2004 chest CT  FINDINGS: CT HEAD FINDINGS  Chronic small-vessel white matter ischemic changes and remote right caudate infarct identified.  No acute intracranial abnormalities are identified, including mass lesion or mass effect, hydrocephalus, extra-axial fluid collection, midline shift, hemorrhage, or acute infarction. The visualized bony calvarium is unremarkable.  CT MAXILLOFACIAL FINDINGS  There is no evidence of acute fracture, subluxation or dislocation.  Soft tissue laceration/injury at the chin noted.  The paranasal sinuses, mastoid air cells and middle/inner ears are clear except for left maxillary sinus mucosal thickening.  Orbits and globes are unremarkable.  CT CERVICAL SPINE FINDINGS  There is no evidence of acute fracture, subluxation or prevertebral soft tissue swelling.  Multilevel degenerative changes throughout the cervical spine noted with moderate degenerative disc disease, spondylosis and facet arthropathy at C3-4, C4-5, and C6-7.  No suspicious focal bony lesions are identified.  A 2 cm posterior left thyroid nodule is  unchanged from 12/25/2004.  IMPRESSION: No evidence of acute intracranial abnormality. Chronic small-vessel white matter ischemic changes and remote right caudate infarct.  Chin soft tissue injury without evidence of facial fracture.  No static evidence of acute injury to the cervical spine. Degenerative changes as described.  Chronic left maxillary sinus disease/sinusitis.   Electronically Signed By: Harmon Pier M.D. On: 03/31/2016 13:59       ____________________________________________   PROCEDURES  Procedure(s) performed: None  Critical Care performed: No  ____________________________________________   INITIAL IMPRESSION / ASSESSMENT AND PLAN / ED COURSE  Pertinent labs & imaging results that were available during my care of the patient were reviewed by me and considered in my medical decision making (see chart for details).  Patient presents for evaluation after a potential near syncopal episode versus episodes where she tripped falling for striking her face at church. She does not lose consciousness but is having some mild left-sided neck pain but no midline cervical tenderness. She has mild bruising over the right.  Patient's blood pressure noted to be significantly elevated, and given her near syncopal episode versus fall and a blood pressure in the 260s I'm concerned about hypertensive urgency. EKG does not show acute ischemic abnormality, she is not playing any acute cardiopulmonary neurologic complaint. I will give her lisinopril, 20 milligrams which is half of her normal home dose as well as 10 mg of hydralazine IV and monitor for improvement.  ----------------------------------------- 3:00 PM on 03/31/2016 ----------------------------------------- After returning from CT scan the patient reports that she had trouble speaking. Indeed she has sudden onset of new dysarthria, she is moving all extremities well and denies any weakness and has no evidence of  pronator drift, but does feel fatigued. No facial deficits. Cranial nerves normal. However, she is having trouble speaking and does appear to be somewhat somnolent. Her blood pressure is noted to be 80 systolic, significant enlarged drop from her initial presentation. The patient was placed in Trendelenburg and given 1 L of normal saline and within about 10 minutes all of her symptoms resolved. Her CT scan demonstrates no acute hemorrhage, and her blood pressure is much improved after fluids. She reports all symptoms are better.   NIH score equals 0, performed by me at bedside at about 3pm. The patient has no pronator drift. The patient has normal cranial nerve exam. Extraocular movements are normal. Visual fields are normal. Patient has 5 out of 5 strength in all extremities. There is no numbness or gross, acute sensory abnormality in the extremities bilaterally. No speech disturbance. No dysarthria. No aphasia. No ataxia. Normal finger nose finger bilat. Patient speaking in full and clear sentences.  All deficits resolve, blood pressure is now improved to 114/50.  ----------------------------------------- 3:34 PM on 03/31/2016 -----------------------------------------  Patient's blood pressures improved to 180 systolic now. She is awake alert with no distress. No ongoing symptoms.  Because the patient's acute  hypotension, was initially seen to be hypertensive urgency treated with was thought to be relatively low dose of hydralazine and lisinopril, both of which are home medications and her near syncopal episode today that somewhat hard for her to recall as to the cause on arrival, he recommended observation. The patient and her family are agreeable with this plan.  She continues to feel well, but we will watch her closely for blood pressure management, and ongoing neurologic duration.  LACERATION REPAIR Performed by: Sharyn Creamer Authorized by: Sharyn Creamer Consent: Verbal consent  obtained. Risks and benefits: risks, benefits and alternatives were discussed Consent given by: patient Patient identity confirmed: provided demographic data Prepped and Draped in normal sterile fashion Wound explored  Laceration Location: Anterior chin  Laceration Length: 2cm  No Foreign Bodies seen or palpated  Anesthesia: local infiltration  Local anesthetic: lidocaine 1% with epinephrine  Anesthetic total: 2 ml  Irrigation method: syringe Amount of cleaning: standard  Skin closure: 6-0 prolene  Number of sutures: 4  Technique: Simple interrupted   Patient tolerance: Patient tolerated the procedure well with no immediate complications.  ____________________________________________   FINAL CLINICAL IMPRESSION(S) / ED DIAGNOSES  Final diagnoses:  Near syncope  Hypotensive episode  Hypertensive urgency  Chin laceration, initial encounter  Closed head injury, initial encounter      Sharyn Creamer, MD 03/31/16 1643

## 2016-03-31 NOTE — ED Notes (Signed)
Informed RN bed ready [phone]

## 2016-03-31 NOTE — ED Notes (Signed)
Attempted to call report. RN in bathroom and will call me back per [employer] operational officersecretary

## 2016-04-01 LAB — TROPONIN I
TROPONIN I: 0.04 ng/mL — AB (ref <0.03)
Troponin I: 0.03 ng/mL (ref <0.03)

## 2016-04-01 MED ORDER — HYDRALAZINE HCL 25 MG PO TABS
25.0000 mg | ORAL_TABLET | Freq: Three times a day (TID) | ORAL | Status: DC
  Administered 2016-04-01 (×2): 25 mg via ORAL
  Filled 2016-04-01 (×2): qty 1

## 2016-04-01 MED ORDER — LABETALOL HCL 5 MG/ML IV SOLN
5.0000 mg | INTRAVENOUS | Status: DC | PRN
  Administered 2016-04-01 – 2016-04-02 (×4): 5 mg via INTRAVENOUS
  Filled 2016-04-01 (×4): qty 4

## 2016-04-01 MED ORDER — ATENOLOL 25 MG PO TABS
100.0000 mg | ORAL_TABLET | Freq: Every day | ORAL | Status: DC
  Administered 2016-04-02 – 2016-04-03 (×2): 100 mg via ORAL
  Filled 2016-04-01 (×2): qty 4

## 2016-04-01 MED ORDER — HYDRALAZINE HCL 10 MG PO TABS
10.0000 mg | ORAL_TABLET | Freq: Three times a day (TID) | ORAL | Status: DC
  Administered 2016-04-01: 10 mg via ORAL
  Filled 2016-04-01: qty 1

## 2016-04-01 NOTE — Progress Notes (Signed)
Pt alert and oriented x4, no complaints of pain or discomfort.  Bed in low position, call bell within reach.  Bed alarms on and functioning.  Assessment done and charted.  Will continue to monitor and do hourly rounding throughout the shift 

## 2016-04-01 NOTE — Progress Notes (Signed)
[facility] [place] at [facility]   PATIENT NAME: Debbie BalesCarol Hampton    MR#:  [id number]  DATE OF BIRTH:  1932-02-26  SUBJECTIVE:  CHIEF COMPLAINT:   Chief Complaint  Patient presents with  . Fall  . Hypertension   No complaint and wanted to go home. But BP was up to 217/57 this am, last BP is 161/59 this pm. REVIEW OF SYSTEMS:  Review of Systems  Constitutional: Negative.   HENT: Negative.   Eyes: Negative.   Respiratory: Negative for cough, hemoptysis, sputum production and shortness of breath.   Cardiovascular: Negative for chest pain, palpitations and leg swelling.  Gastrointestinal: Negative for nausea, vomiting, abdominal pain and diarrhea.  Genitourinary: Negative.   Musculoskeletal: Negative for joint pain.  Skin: Negative.   Neurological: Negative.  Negative for headaches.  Psychiatric/Behavioral: Negative.     DRUG ALLERGIES:   Allergies  Allergen Reactions  . Aloe   . Penicillins    VITALS:  Blood pressure 161/59, pulse 59, temperature 98 F (36.7 C), temperature source Oral, resp. rate 18, height 5\' 2"  (1.575 m), weight 134 lb (60.782 kg), SpO2 97 %. PHYSICAL EXAMINATION:  Physical Exam  Constitutional: She is oriented to person, place, and time and well-developed, well-nourished, and in no distress.  HENT:  Head: Normocephalic.  Mouth/Throat: Oropharynx is clear and moist.  Eyes: Conjunctivae are normal. Pupils are equal, round, and reactive to light.  Neck: Neck supple. No JVD present. No thyromegaly present.  Cardiovascular: Normal rate and regular rhythm.  Exam reveals no friction rub.   No murmur heard. Pulmonary/Chest: No respiratory distress. She has no wheezes. She has no rales.  Abdominal: Soft. Bowel sounds are normal. She exhibits no distension. There is no tenderness.  Musculoskeletal: Normal range of motion. She exhibits no edema or tenderness.  Neurological: She is alert and oriented to person, place, and time. She has  normal reflexes.  Skin: Skin is dry. No rash noted. No erythema.  Psychiatric: Mood, affect and judgment normal.   LABORATORY PANEL:   CBC  Recent Labs Lab 03/31/16 1228  WBC 7.1  HGB 12.3  HCT 35.1  PLT 209   ------------------------------------------------------------------------------------------------------------------ Chemistries   Recent Labs Lab 03/31/16 1228  NA 134*  K 3.6  CL 102  CO2 25  GLUCOSE 100*  BUN 19  CREATININE 0.58  CALCIUM 9.3   RADIOLOGY:  No results found. ASSESSMENT AND PLAN:   82 year old Caucasian female history of essential hypertension presenting after mechanical fall and had blood pressure swing with aphasia symptoms  1. Hypertensive urgency: uncontrolled and labile BP. Continue lisinopril and hydralazine po, increase atenolol to 100 mg daily, avoid IV hydralazine. Labetalol iv prn.  2. Near syncope: Secondary to blood pressure changes,  on telemetry.  3. Venous thromboembolism prophylactic: lovenox  All the records are reviewed and case discussed with Care Management/Social Worker. Management plans discussed with the patient, her husband and they are in agreement.  CODE STATUS: full code.  TOTAL TIME TAKING CARE OF THIS PATIENT: 45 minutes.   More than 50% of the time was spent in counseling/coordination of care: YES  POSSIBLE D/C IN 1 DAYS, DEPENDING ON CLINICAL CONDITION.   Shaune Pollackhen, Maronda Caison M.D on 04/01/2016 at 5:25 PM  Between 7am to 6pm - Pager - [phone]  After 6pm go to [email or web address] - Social research officer, governmentpassword EPAS [email or web address]  [facility] [place] [facility]  Office  [phone]  CC: Primary care physician; BABAOFF, Lavada MesiMARC E, MD  Note: This dictation was prepared with  Dragon dictation along with smaller Company secretary. Any transcriptional errors that result from this process are unintentional.

## 2016-04-01 NOTE — Discharge Instructions (Signed)
Heart healthy diet. °Activity as tolerated. °Fall precaution. °

## 2016-04-02 MED ORDER — HYDROCHLOROTHIAZIDE 25 MG PO TABS
25.0000 mg | ORAL_TABLET | Freq: Every day | ORAL | Status: DC
  Administered 2016-04-02 – 2016-04-03 (×2): 25 mg via ORAL
  Filled 2016-04-02 (×2): qty 1

## 2016-04-02 MED ORDER — PANTOPRAZOLE SODIUM 40 MG PO TBEC
40.0000 mg | DELAYED_RELEASE_TABLET | Freq: Every day | ORAL | Status: DC
  Administered 2016-04-02 – 2016-04-03 (×2): 40 mg via ORAL
  Filled 2016-04-02 (×2): qty 1

## 2016-04-02 MED ORDER — HYDRALAZINE HCL 25 MG PO TABS
25.0000 mg | ORAL_TABLET | Freq: Three times a day (TID) | ORAL | Status: DC
  Administered 2016-04-02 – 2016-04-03 (×4): 25 mg via ORAL
  Filled 2016-04-02 (×4): qty 1

## 2016-04-02 NOTE — Plan of Care (Signed)
Problem: Safety: Goal: Ability to remain free from injury will improve Outcome: Progressing Fall precautions in place  Problem: Tissue Perfusion: Goal: Risk factors for ineffective tissue perfusion will decrease Outcome: Progressing SQ Lovenox     

## 2016-04-02 NOTE — Progress Notes (Signed)
BP 180/90, got pt to consent to trying the IV Labetalol for her increased BP. Labetalol 5mg  iv given, will recheck her BP in 30 minutes

## 2016-04-02 NOTE — Progress Notes (Signed)
[facility] - [place] at [facility]   PATIENT NAME: Debbie Hampton    MR#:  [id number]  DATE OF BIRTH:  [birth year]  SUBJECTIVE:  CHIEF COMPLAINT:   Chief Complaint  Patient presents with  . Fall  . Hypertension   No complaint.. But BP is still uncontrolled. BP was down to 167/64 but up to 229/93 just now. REVIEW OF SYSTEMS:  Review of Systems  Constitutional: Negative.   HENT: Negative.   Eyes: Negative.   Respiratory: Negative for cough, hemoptysis, sputum production and shortness of breath.   Cardiovascular: Negative for chest pain, palpitations and leg swelling.  Gastrointestinal: Negative for nausea, vomiting, abdominal pain and diarrhea.  Genitourinary: Negative.   Musculoskeletal: Negative for joint pain.  Skin: Negative.   Neurological: Negative.  Negative for headaches.  Psychiatric/Behavioral: Negative.     DRUG ALLERGIES:   Allergies  Allergen Reactions  . Penicillins     Has patient had a PCN reaction causing immediate rash, facial/tongue/throat swelling, SOB or lightheadedness with hypotension: Yes Has patient had a PCN reaction causing severe rash involving mucus membranes or skin necrosis: No Has patient had a PCN reaction that required hospitalization No Has patient had a PCN reaction occurring within the last 10 years: Yes If all of the above answers are "NO", then may proceed with Cephalosporin use.   . Aloe Hives and Rash  . Amlodipine Hives, Rash and Hypertension  . Cephalexin Rash  . Metoprolol Hives, Palpitations and Hypertension  . Miconazole Swelling and Rash  . Neosporin [Neomycin-Bacitracin Zn-Polymyx] Rash  . Trandolapril-Verapamil Hcl Er Rash   VITALS:  Blood pressure 229/93, pulse 68, temperature 98.3 F (36.8 C), temperature source Oral, resp. rate 18, height  (1.575 m), weight 134 lb (60.782 kg), SpO2 100 %. PHYSICAL EXAMINATION:  Physical Exam  Constitutional: She is oriented to person, place, and time and  well-developed, well-nourished, and in no distress.  HENT:  Head: Normocephalic.  Mouth/Throat: Oropharynx is clear and moist.  Eyes: Conjunctivae are normal. Pupils are equal, round, and reactive to light.  Neck: Neck supple. No JVD present. No thyromegaly present.  Cardiovascular: Normal rate and regular rhythm.  Exam reveals no friction rub.   No murmur heard. Pulmonary/Chest: No respiratory distress. She has no wheezes. She has no rales.  Abdominal: Soft. Bowel sounds are normal. She exhibits no distension. There is no tenderness.  Musculoskeletal: Normal range of motion. She exhibits no edema or tenderness.  Neurological: She is alert and oriented to person, place, and time. She has normal reflexes.  Skin: Skin is dry. No rash noted. No erythema.  Psychiatric: Mood, affect and judgment normal.   LABORATORY PANEL:   CBC  Recent Labs Lab 03/31/16 1228  WBC 7.1  HGB 12.3  HCT 35.1  PLT 209   ------------------------------------------------------------------------------------------------------------------ Chemistries   Recent Labs Lab 03/31/16 1228  NA 134*  K 3.6  CL 102  CO2 25  GLUCOSE 100*  BUN 19  CREATININE 0.58  CALCIUM 9.3   RADIOLOGY:  No results found. ASSESSMENT AND PLAN:   82 year old Caucasian female history of essential hypertension presenting after mechanical fall and had blood pressure swing with aphasia symptoms  1. Hypertensive urgency: uncontrolled and labile BP. BP is still high, last BP is 229/93. She refused IV labetalol or hydralazine due to previous low BP.  Continue lisinopril and hydralazine po, increased atenolol to 100 mg daily and hydralazine to 25 mg po tid, avoid IV hydralazine. Labetalol iv prn. Add HCTZ.  2. Near syncope: Secondary to blood pressure changes,  on telemetry.  3. Venous thromboembolism prophylactic: lovenox  All the records are reviewed and case discussed with Care Management/Social Worker. Management plans  discussed with the patient, her husband and they are in agreement.  CODE STATUS: full code.  TOTAL TIME TAKING CARE OF THIS PATIENT: 39 minutes.   More than 50% of the time was spent in counseling/coordination of care: YES  POSSIBLE D/C IN 1-2 DAYS, DEPENDING ON CLINICAL CONDITION.   Shaune Pollackhen, Cardin Nitschke M.D on 04/02/2016 at 3:18 PM  Between 7am to 6pm - Pager - [phone]  After 6pm go to [email or web address] - Social research officer, governmentpassword EPAS [email or web address]  [facility] [place] [facility]  Office  [phone]  CC: Primary care physician; BABAOFF, Lavada MesiMARC E, MD  Note: This dictation was prepared with Dragon dictation along with smaller phrase technology. Any transcriptional errors that result from this process are unintentional.

## 2016-04-02 NOTE — Progress Notes (Signed)
Patient continues to have elevated BP.  PRN labetalol was administered for SBP>220. Dr. Sheryle Hailiamond was contacted for SBP>190 post labetalol. Dr. Sheryle Hailiamond attended to patient at the bedside. Patient refuse any intervention for her elevated BP.  No new order received.

## 2016-04-02 NOTE — Progress Notes (Signed)
BP elevated, pt does not want to take IV hydralazine at this time, requesting her am medications.

## 2016-04-03 MED ORDER — ATENOLOL 100 MG PO TABS: 100.0000 mg | ORAL_TABLET | Freq: Every day | ORAL | Status: DC

## 2016-04-03 MED ORDER — HYDRALAZINE HCL 25 MG PO TABS: 25.0000 mg | ORAL_TABLET | Freq: Three times a day (TID) | ORAL | Status: DC

## 2016-04-03 NOTE — Discharge Summary (Signed)
[facility] - [place] at [facility]   PATIENT NAME: Debbie Hampton    MR#:  [id number]  DATE OF BIRTH:  [birth year]  DATE OF ADMISSION:  03/31/2016 ADMITTING PHYSICIAN: Wyatt Haste, MD  DATE OF DISCHARGE: 04/03/2016 PRIMARY CARE PHYSICIAN: BABAOFF, Lavada Mesi, MD    ADMISSION DIAGNOSIS:  Hypertensive urgency [I16.0] Near syncope [R55] Hypotensive episode [I95.9] Closed head injury, initial encounter [S09.90XA] Chin laceration, initial encounter [S01.81XA]  DISCHARGE DIAGNOSIS:  Principal Problem:   Syncope, near Hypertensive urgency  SECONDARY DIAGNOSIS:   Past Medical History  Diagnosis Date  . Hypertension   . GERD (gastroesophageal reflux disease)     HOSPITAL COURSE:   82 year old Caucasian female history of essential hypertension presenting after mechanical fall and had blood pressure swing with aphasia symptoms  1. Hypertensive urgency:  BP is better controlled with lisinopril and hydralazine po, increased atenolol to 100 mg daily and hydralazine to 25 mg po tid,  Labetalol iv prn. Added HCTZ, .avoid IV hydralazine due to causing low BP.  2. Near syncope: Secondary to blood pressure changes, on telemetry.  3. Venous thromboembolism prophylactic: lovenox  DISCHARGE CONDITIONS:   Stable, discharged to home today.  CONSULTS OBTAINED:     DRUG ALLERGIES:   Allergies  Allergen Reactions  . Penicillins     Has patient had a PCN reaction causing immediate rash, facial/tongue/throat swelling, SOB or lightheadedness with hypotension: Yes Has patient had a PCN reaction causing severe rash involving mucus membranes or skin necrosis: No Has patient had a PCN reaction that required hospitalization No Has patient had a PCN reaction occurring within the last 10 years: Yes If all of the above answers are "NO", then may proceed with Cephalosporin use.   . Aloe Hives and Rash  . Amlodipine Hives, Rash and Hypertension  . Cephalexin Rash  .  Metoprolol Hives, Palpitations and Hypertension  . Miconazole Swelling and Rash  . Neosporin [Neomycin-Bacitracin Zn-Polymyx] Rash  . Trandolapril-Verapamil Hcl Er Rash    DISCHARGE MEDICATIONS:   Current Discharge Medication List    CONTINUE these medications which have CHANGED   Details  atenolol (TENORMIN) 100 MG tablet Take 1 tablet (100 mg total) by mouth daily. Qty: 30 tablet, Refills: 1    hydrALAZINE (APRESOLINE) 25 MG tablet Take 1 tablet (25 mg total) by mouth 3 (three) times daily. Qty: 90 tablet, Refills: 1      CONTINUE these medications which have NOT CHANGED   Details  esomeprazole (NEXIUM) 40 MG capsule Take 40 mg by mouth daily. Over the counter extended release    hydrochlorothiazide (HYDRODIURIL) 25 MG tablet Take 12.5 mg by mouth daily.     lisinopril (PRINIVIL,ZESTRIL) 40 MG tablet Take 40 mg by mouth daily. Refills: 3         DISCHARGE INSTRUCTIONS:    DIET:  Heart healthy diet.  DISCHARGE CONDITION:  Stable.  ACTIVITY:  As tolerated.  DISCHARGE LOCATION:    If you experience worsening of your admission symptoms, develop shortness of breath, life threatening emergency, suicidal or homicidal thoughts you must seek medical attention immediately by calling 911 or calling your MD immediately  if symptoms less severe.  You Must read complete instructions/literature along with all the possible adverse reactions/side effects for all the Medicines you take and that have been prescribed to you. Take any new Medicines after you have completely understood and accpet all the possible adverse reactions/side effects.   Please note  You were cared for by a  hospitalist during your hospital stay. If you have any questions about your discharge medications or the care you received while you were in the hospital after you are discharged, you can call the unit and asked to speak with the hospitalist on call if the hospitalist that took care of you is not  available. Once you are discharged, your primary care physician will handle any further medical issues. Please note that NO REFILLS for any discharge medications will be authorized once you are discharged, as it is imperative that you return to your primary care physician (or establish a relationship with a primary care physician if you do not have one) for your aftercare needs so that they can reassess your need for medications and monitor your lab values.    On the day of Discharge:  VITAL SIGNS:  Blood pressure 160/60, pulse 60, temperature 98.2 F (36.8 C), temperature source Oral, resp. rate 18, height 5\' 2"  (1.575 m), weight 134 lb (60.782 kg), SpO2 96 %.  PHYSICAL EXAMINATION:  GENERAL:  82 y.o.-year-old patient lying in the bed with no acute distress.  EYES: Pupils equal, round, reactive to light and accommodation. No scleral icterus. Extraocular muscles intact.  HEENT: Head atraumatic, normocephalic. Oropharynx and nasopharynx clear.  NECK:  Supple, no jugular venous distention. No thyroid enlargement, no tenderness.  LUNGS: Normal breath sounds bilaterally, no wheezing, rales,rhonchi or crepitation. No use of accessory muscles of respiration.  CARDIOVASCULAR: S1, S2 normal. No murmurs, rubs, or gallops.  ABDOMEN: Soft, non-tender, non-distended. Bowel sounds present. No organomegaly or mass.  EXTREMITIES: No pedal edema, cyanosis, or clubbing.  NEUROLOGIC: Cranial nerves II through XII are intact. Muscle strength 5/5 in all extremities. Sensation intact. Gait not checked.  PSYCHIATRIC: The patient is alert and oriented x 3.  SKIN: No obvious rash, lesion, or ulcer.  DATA REVIEW:   CBC  Recent Labs Lab 03/31/16 1228  WBC 7.1  HGB 12.3  HCT 35.1  PLT 209    Chemistries   Recent Labs Lab 03/31/16 1228  NA 134*  K 3.6  CL 102  CO2 25  GLUCOSE 100*  BUN 19  CREATININE 0.58  CALCIUM 9.3    Cardiac Enzymes  Recent Labs Lab 04/01/16 0532  TROPONINI 0.03*     Microbiology Results  No results found for this or any previous visit.  RADIOLOGY:  No results found.   Management plans discussed with the patient, her husband and they are in agreement.  CODE STATUS:     Code Status Orders        Start     Ordered   03/31/16 1523  Full code   Continuous     03/31/16 1522    Code Status History    Date Active Date Inactive Code Status Order ID Comments User Context   This patient has a current code status but no historical code status.    Advance Directive Documentation        Most Recent Value   Type of Advance Directive  Living will   Pre-existing out of facility DNR order (yellow form or pink MOST form)     "MOST" Form in Place?        TOTAL TIME TAKING CARE OF THIS PATIENT: 33 minutes.    Shaune Pollack M.D on 04/03/2016 at 1:58 PM  Between 7am to 6pm - Pager - [phone]  After 6pm go to [email or web address] - password [email or web address] [facility]  [place] [facility]  Office  [phone]  CC: Primary care  physician; Rozanna BoxBABAOFF, MARC E, MD   Note: This dictation was prepared with Dragon dictation along with smaller phrase technology. Any transcriptional errors that result from this process are unintentional.

## 2016-04-03 NOTE — Progress Notes (Signed)
Discharge instructions given to patient and husband. Prescriptions given along with education for hypertension and meds. Questions answered. IV and tele removed. Her son is on the way to pick her up.

## 2016-04-03 NOTE — Progress Notes (Signed)
Patient rested for most of the night. One time 5mg  Labetalol was administered for SBP>190. Patient has an improved BP in AM.

## 2017-07-29 ENCOUNTER — Emergency Department: Payer: Medicare Other

## 2017-07-29 ENCOUNTER — Inpatient Hospital Stay
Admission: EM | Admit: 2017-07-29 | Discharge: 2017-08-01 | DRG: 065 | Disposition: A | Discharge status: Rehabilitation Facility | Payer: Medicare Other | Attending: Internal Medicine | Admitting: Internal Medicine

## 2017-07-29 ENCOUNTER — Encounter: Payer: Self-pay | Admitting: *Deleted

## 2017-07-29 DIAGNOSIS — E876 Hypokalemia: Secondary | ICD-10-CM | POA: Diagnosis present

## 2017-07-29 DIAGNOSIS — I161 Hypertensive emergency: Secondary | ICD-10-CM | POA: Diagnosis present

## 2017-07-29 DIAGNOSIS — I6789 Other cerebrovascular disease: Secondary | ICD-10-CM | POA: Diagnosis present

## 2017-07-29 DIAGNOSIS — I671 Cerebral aneurysm, nonruptured: Secondary | ICD-10-CM | POA: Diagnosis present

## 2017-07-29 DIAGNOSIS — Z66 Do not resuscitate: Secondary | ICD-10-CM | POA: Diagnosis present

## 2017-07-29 DIAGNOSIS — Z8249 Family history of ischemic heart disease and other diseases of the circulatory system: Secondary | ICD-10-CM | POA: Diagnosis not present

## 2017-07-29 DIAGNOSIS — R531 Weakness: Secondary | ICD-10-CM | POA: Diagnosis not present

## 2017-07-29 DIAGNOSIS — I10 Essential (primary) hypertension: Secondary | ICD-10-CM | POA: Diagnosis present

## 2017-07-29 DIAGNOSIS — Z87891 Personal history of nicotine dependence: Secondary | ICD-10-CM | POA: Diagnosis not present

## 2017-07-29 DIAGNOSIS — Z91048 Other nonmedicinal substance allergy status: Secondary | ICD-10-CM

## 2017-07-29 DIAGNOSIS — Z888 Allergy status to other drugs, medicaments and biological substances status: Secondary | ICD-10-CM | POA: Diagnosis not present

## 2017-07-29 DIAGNOSIS — G8194 Hemiplegia, unspecified affecting left nondominant side: Secondary | ICD-10-CM | POA: Diagnosis present

## 2017-07-29 DIAGNOSIS — F419 Anxiety disorder, unspecified: Secondary | ICD-10-CM | POA: Diagnosis not present

## 2017-07-29 DIAGNOSIS — K219 Gastro-esophageal reflux disease without esophagitis: Secondary | ICD-10-CM | POA: Diagnosis present

## 2017-07-29 DIAGNOSIS — R29701 NIHSS score 1: Secondary | ICD-10-CM | POA: Diagnosis present

## 2017-07-29 DIAGNOSIS — G8929 Other chronic pain: Secondary | ICD-10-CM | POA: Diagnosis present

## 2017-07-29 DIAGNOSIS — E871 Hypo-osmolality and hyponatremia: Secondary | ICD-10-CM | POA: Diagnosis present

## 2017-07-29 DIAGNOSIS — Z881 Allergy status to other antibiotic agents status: Secondary | ICD-10-CM | POA: Diagnosis not present

## 2017-07-29 DIAGNOSIS — I6501 Occlusion and stenosis of right vertebral artery: Secondary | ICD-10-CM | POA: Diagnosis present

## 2017-07-29 DIAGNOSIS — I6523 Occlusion and stenosis of bilateral carotid arteries: Secondary | ICD-10-CM | POA: Diagnosis present

## 2017-07-29 DIAGNOSIS — R7303 Prediabetes: Secondary | ICD-10-CM | POA: Diagnosis present

## 2017-07-29 DIAGNOSIS — R270 Ataxia, unspecified: Secondary | ICD-10-CM | POA: Diagnosis present

## 2017-07-29 DIAGNOSIS — I639 Cerebral infarction, unspecified: Secondary | ICD-10-CM

## 2017-07-29 DIAGNOSIS — R296 Repeated falls: Secondary | ICD-10-CM | POA: Diagnosis present

## 2017-07-29 DIAGNOSIS — R001 Bradycardia, unspecified: Secondary | ICD-10-CM | POA: Diagnosis present

## 2017-07-29 DIAGNOSIS — I340 Nonrheumatic mitral (valve) insufficiency: Secondary | ICD-10-CM | POA: Diagnosis not present

## 2017-07-29 DIAGNOSIS — Z880 Allergy status to penicillin: Secondary | ICD-10-CM | POA: Diagnosis not present

## 2017-07-29 HISTORY — DX: Polyp of colon: K63.5

## 2017-07-29 HISTORY — DX: Impaired fasting glucose: R73.01

## 2017-07-29 HISTORY — DX: Pain in left shoulder: M25.512

## 2017-07-29 HISTORY — DX: Constipation, unspecified: K59.00

## 2017-07-29 HISTORY — DX: Cerebral infarction, unspecified: I63.9

## 2017-07-29 HISTORY — DX: Asymptomatic varicose veins of bilateral lower extremities: I83.93

## 2017-07-29 HISTORY — DX: Pain in right shoulder: M25.511

## 2017-07-29 HISTORY — DX: Syncope and collapse: R55

## 2017-07-29 LAB — CBC
HEMATOCRIT: 37.2 % (ref 35.0–47.0)
Hemoglobin: 12.4 g/dL (ref 12.0–16.0)
MCH: 27.2 pg (ref 26.0–34.0)
MCHC: 33.3 g/dL (ref 32.0–36.0)
MCV: 81.7 fL (ref 80.0–100.0)
Platelets: 251 10*3/uL (ref 150–440)
RBC: 4.56 MIL/uL (ref 3.80–5.20)
RDW: 14.5 % (ref 11.5–14.5)
WBC: 7 10*3/uL (ref 3.6–11.0)

## 2017-07-29 LAB — URINALYSIS, COMPLETE (UACMP) WITH MICROSCOPIC
BACTERIA UA: NONE SEEN
Bilirubin Urine: NEGATIVE
Glucose, UA: NEGATIVE mg/dL
Hgb urine dipstick: NEGATIVE
KETONES UR: 5 mg/dL — AB
LEUKOCYTES UA: NEGATIVE
Nitrite: NEGATIVE
PH: 7 (ref 5.0–8.0)
PROTEIN: NEGATIVE mg/dL
SQUAMOUS EPITHELIAL / LPF: NONE SEEN
Specific Gravity, Urine: 1.006 (ref 1.005–1.030)

## 2017-07-29 LAB — BASIC METABOLIC PANEL
ANION GAP: 10 (ref 5–15)
BUN: 9 mg/dL (ref 6–20)
CO2: 27 mmol/L (ref 22–32)
CREATININE: 0.57 mg/dL (ref 0.44–1.00)
Calcium: 9.9 mg/dL (ref 8.9–10.3)
Chloride: 90 mmol/L — ABNORMAL LOW (ref 101–111)
GFR calc non Af Amer: >60 mL/min (ref >60)
GLUCOSE: 140 mg/dL — AB (ref 65–99)
Potassium: 3.4 mmol/L — ABNORMAL LOW (ref 3.5–5.1)
Sodium: 127 mmol/L — ABNORMAL LOW (ref 135–145)

## 2017-07-29 MED ORDER — ATENOLOL 100 MG PO TABS
100.0000 mg | ORAL_TABLET | Freq: Every day | ORAL | Status: DC
  Filled 2017-07-29: qty 1

## 2017-07-29 MED ORDER — HYDRALAZINE HCL 20 MG/ML IJ SOLN
10.0000 mg | Freq: Four times a day (QID) | INTRAMUSCULAR | Status: DC | PRN
  Administered 2017-07-29 – 2017-07-30 (×2): 10 mg via INTRAVENOUS
  Filled 2017-07-29 (×3): qty 1

## 2017-07-29 MED ORDER — ASPIRIN EC 325 MG PO TBEC
325.0000 mg | DELAYED_RELEASE_TABLET | Freq: Every day | ORAL | Status: DC
  Administered 2017-07-30: 11:00:00 325 mg via ORAL
  Filled 2017-07-29: qty 1

## 2017-07-29 MED ORDER — POTASSIUM CHLORIDE CRYS ER 20 MEQ PO TBCR
40.0000 meq | EXTENDED_RELEASE_TABLET | Freq: Once | ORAL | Status: AC
  Administered 2017-08-01: 06:00:00 40 meq via ORAL
  Filled 2017-07-29: qty 2

## 2017-07-29 MED ORDER — ALUM & MAG HYDROXIDE-SIMETH 200-200-20 MG/5ML PO SUSP: 30.0000 mL | Freq: Four times a day (QID) | ORAL | Status: DC | PRN

## 2017-07-29 MED ORDER — LISINOPRIL 20 MG PO TABS
40.0000 mg | ORAL_TABLET | Freq: Every day | ORAL | Status: DC
  Administered 2017-07-31 – 2017-08-01 (×2): 40 mg via ORAL
  Filled 2017-07-29 (×3): qty 2

## 2017-07-29 MED ORDER — SENNOSIDES-DOCUSATE SODIUM 8.6-50 MG PO TABS: 1.0000 | ORAL_TABLET | Freq: Every evening | ORAL | Status: DC | PRN

## 2017-07-29 MED ORDER — ASPIRIN 81 MG PO CHEW
324.0000 mg | CHEWABLE_TABLET | Freq: Once | ORAL | Status: AC
  Administered 2017-07-29: 324 mg via ORAL
  Filled 2017-07-29: qty 4

## 2017-07-29 MED ORDER — INFLUENZA VAC SPLIT HIGH-DOSE 0.5 ML IM SUSY
0.5000 mL | PREFILLED_SYRINGE | INTRAMUSCULAR | Status: DC
  Filled 2017-07-29: qty 0.5

## 2017-07-29 MED ORDER — ENOXAPARIN SODIUM 40 MG/0.4ML ~~LOC~~ SOLN
40.0000 mg | SUBCUTANEOUS | Status: DC
  Administered 2017-07-29 – 2017-07-31 (×3): 40 mg via SUBCUTANEOUS
  Filled 2017-07-29 (×3): qty 0.4

## 2017-07-29 MED ORDER — HYDROCHLOROTHIAZIDE 25 MG PO TABS: 12.5000 mg | ORAL_TABLET | Freq: Every day | ORAL | Status: DC

## 2017-07-29 MED ORDER — STROKE: EARLY STAGES OF RECOVERY BOOK
Freq: Once | Status: AC
  Administered 2017-07-30: 06:00:00

## 2017-07-29 MED ORDER — ACETAMINOPHEN 325 MG PO TABS
650.0000 mg | ORAL_TABLET | ORAL | Status: DC | PRN
  Filled 2017-07-29: qty 2

## 2017-07-29 MED ORDER — ACETAMINOPHEN 325 MG PO TABS
650.0000 mg | ORAL_TABLET | Freq: Four times a day (QID) | ORAL | Status: DC | PRN
  Administered 2017-08-01: 06:00:00 650 mg via ORAL

## 2017-07-29 MED ORDER — SODIUM CHLORIDE 0.9 % IV SOLN
INTRAVENOUS | Status: DC
  Administered 2017-07-30 – 2017-07-31 (×3): via INTRAVENOUS

## 2017-07-29 MED ORDER — ONDANSETRON HCL 4 MG/2ML IJ SOLN: 4.0000 mg | Freq: Four times a day (QID) | INTRAMUSCULAR | Status: DC | PRN

## 2017-07-29 MED ORDER — HYDRALAZINE HCL 50 MG PO TABS
25.0000 mg | ORAL_TABLET | Freq: Three times a day (TID) | ORAL | Status: DC
  Administered 2017-07-29 – 2017-08-01 (×5): 25 mg via ORAL
  Filled 2017-07-29 (×7): qty 1

## 2017-07-29 MED ORDER — PANTOPRAZOLE SODIUM 40 MG PO TBEC
40.0000 mg | DELAYED_RELEASE_TABLET | Freq: Every day | ORAL | Status: DC
  Administered 2017-07-30 – 2017-08-01 (×3): 40 mg via ORAL
  Filled 2017-07-29 (×3): qty 1

## 2017-07-29 NOTE — Care Management Note (Signed)
Patient requests no reference to observation be included in admission paperwork as insurance has limitations/requirements for observation that may exclude this admission.

## 2017-07-29 NOTE — ED Triage Notes (Signed)
Patient arrived by EMS from home. Patient ambulatory with EMS no problems. C/o weakness. EMs vitals 174/72, HR 84, RR 16, 97% RA. Grips strong. No facial droop. Speech clear

## 2017-07-29 NOTE — ED Notes (Signed)
Pt contact  Sons- Michaelle CopasWestley Dicenso [phone] (c) Work- [phone]  Deniece PortelaWayne [phone]

## 2017-07-29 NOTE — H&P (Addendum)
[facility] [place] [facility]   PATIENT NAME: Debbie Hampton    MR#:  [id number]  DATE OF BIRTH:  25-Oct-1931  DATE OF ADMISSION:  07/29/2017  PRIMARY CARE PHYSICIAN: Kandyce RudBabaoff, Marcus, MD   REQUESTING/REFERRING PHYSICIAN: Minna AntisPaduchowski, Kevin, MD  CHIEF COMPLAINT:   Chief Complaint  Patient presents with  . Weakness   Left-sided weakness today. HISTORY OF PRESENT ILLNESS:  Debbie Hampton  is a 83 y.o. female with a known history of hypertension and GERD.  The patient presented to ED with above chief complaint.  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  She has had a fall for the past 2-3 days.  She feels left arm and leg weakness at about 3 this morning.  Since his blood pressure is high, she took hydralazine at home.  Her son brought her to the hospital due to above.  The patient denies any slurred speech, dysphagia or incontinence.  MRI of the brain show acute CVA.  PAST MEDICAL HISTORY:   Past Medical History:  Diagnosis Date  . GERD (gastroesophageal reflux disease)   . Hypertension     PAST SURGICAL HISTORY:   Past Surgical History:  Procedure Laterality Date  . TONSILLECTOMY    . TUMOR REMOVAL     ovaries    SOCIAL HISTORY:   Social History   Tobacco Use  . Smoking status: Never Smoker  . Smokeless tobacco: Never Used  Substance Use Topics  . Alcohol use: No    FAMILY HISTORY:   Family History  Problem Relation Age of Onset  . Hypertension Other     DRUG ALLERGIES:   Allergies  Allergen Reactions  . Penicillins     Has patient had a PCN reaction causing immediate rash, facial/tongue/throat swelling, SOB or lightheadedness with hypotension: Yes Has patient had a PCN reaction causing severe rash involving mucus membranes or skin necrosis: No Has patient had a PCN reaction that required hospitalization No Has patient had a PCN reaction occurring within the last 10 years: Yes If all of the above answers are "NO", then may proceed with  Cephalosporin use.   . Aloe Hives and Rash  . Amlodipine Hives, Rash and Hypertension  . Cephalexin Rash  . Metoprolol Hives, Palpitations and Hypertension  . Miconazole Swelling and Rash  . Neosporin [Neomycin-Bacitracin Zn-Polymyx] Rash  . Trandolapril-Verapamil Hcl Er Rash    REVIEW OF SYSTEMS:   Review of Systems  Constitutional: Negative for chills, fever and malaise/fatigue.  HENT: Negative for sore throat.   Eyes: Negative for blurred vision and double vision.  Respiratory: Negative for cough, hemoptysis, shortness of breath, wheezing and stridor.   Cardiovascular: Negative for chest pain, palpitations, orthopnea and leg swelling.  Gastrointestinal: Negative for abdominal pain, blood in stool, diarrhea, melena, nausea and vomiting.  Genitourinary: Negative for dysuria, flank pain and hematuria.  Musculoskeletal: Negative for back pain and joint pain.  Neurological: Positive for sensory change and focal weakness. Negative for dizziness, seizures, loss of consciousness, weakness and headaches.  Endo/Heme/Allergies: Negative for polydipsia.  Psychiatric/Behavioral: Negative for depression. The patient is not nervous/anxious.     MEDICATIONS AT HOME:   Prior to Admission medications   Medication Sig Start Date End Date Taking? Authorizing Provider  atenolol (TENORMIN) 100 MG tablet Take 1 tablet (100 mg total) by mouth daily. 04/03/16  Yes Shaune Pollackhen, Daanish Copes, MD  esomeprazole (NEXIUM) 40 MG capsule Take 40 mg by mouth daily. Over the counter extended release   Yes [provider]  hydrALAZINE (APRESOLINE) 25 MG tablet Take 1 tablet (25 mg total) by mouth 3 (three) times daily. 04/03/16  Yes Shaune Pollack, MD  hydrochlorothiazide (HYDRODIURIL) 25 MG tablet Take 12.5 mg by mouth daily.    Yes [provider]  lisinopril (PRINIVIL,ZESTRIL) 40 MG tablet Take 40 mg by mouth daily. 02/22/16  Yes [provider]      VITAL SIGNS:  Blood pressure (!) 145/59, pulse 63,  temperature 98.2 F (36.8 C), temperature source Oral, resp. rate 16, height 5' (1.524 m), weight 126 lb (57.2 kg), SpO2 97 %.  PHYSICAL EXAMINATION:  Physical Exam  GENERAL:  83 y.o.-year-old patient lying in the bed with no acute distress.  EYES: Pupils equal, round, reactive to light and accommodation. No scleral icterus. Extraocular muscles intact.  HEENT: Head atraumatic, normocephalic. Oropharynx and nasopharynx clear.  NECK:  Supple, no jugular venous distention. No thyroid enlargement, no tenderness.  LUNGS: Normal breath sounds bilaterally, no wheezing, rales,rhonchi or crepitation. No use of accessory muscles of respiration.  CARDIOVASCULAR: S1, S2 normal. No murmurs, rubs, or gallops.  ABDOMEN: Soft, nontender, nondistended. Bowel sounds present. No organomegaly or mass.  EXTREMITIES: No pedal edema, cyanosis, or clubbing.  NEUROLOGIC: Cranial nerves II through XII are intact. Muscle strength 3/5 in left extremities, 4/5 in right extremities. Sensation intact. Gait not checked.  PSYCHIATRIC: The patient is alert and oriented x 3.  SKIN: No obvious rash, lesion, or ulcer.   LABORATORY PANEL:   CBC Recent Labs  Lab 07/29/17 1006  WBC 7.0  HGB 12.4  HCT 37.2  PLT 251   ------------------------------------------------------------------------------------------------------------------  Chemistries  Recent Labs  Lab 07/29/17 1006  NA 127*  K 3.4*  CL 90*  CO2 27  GLUCOSE 140*  BUN 9  CREATININE 0.57  CALCIUM 9.9   ------------------------------------------------------------------------------------------------------------------  Cardiac Enzymes No results for input(s): TROPONINI in the last 168 hours. ------------------------------------------------------------------------------------------------------------------  RADIOLOGY:  Ct Head Wo Contrast  Result Date: 07/29/2017 CLINICAL DATA:  Posttraumatic headache after multiple falls. Bilateral lower extremity  weakness. EXAM: CT HEAD WITHOUT CONTRAST TECHNIQUE: Contiguous axial images were obtained from the base of the skull through the vertex without intravenous contrast. COMPARISON:  CT scan of March 31, 2016. FINDINGS: Brain: Mild chronic ischemic white matter disease is noted. No mass effect or midline shift is noted. Ventricular size is within normal limits. There is no evidence of mass lesion, hemorrhage or acute infarction. Vascular: No hyperdense vessel or unexpected calcification. Skull: Normal. Negative for fracture or focal lesion. Sinuses/Orbits: Mild left maxillary sinusitis is noted. Other: None. IMPRESSION: Mild chronic ischemic white matter disease. No acute intracranial abnormality seen. Electronically Signed   By: Lupita Raider, M.D.   On: 07/29/2017 10:50   Mr Brain Wo Contrast  Result Date: 07/29/2017 CLINICAL DATA:  Multiple falls over the last several days. Headache. Lower extremity weakness. EXAM: MRI HEAD WITHOUT CONTRAST TECHNIQUE: Multiplanar, multiecho pulse sequences of the brain and surrounding structures were obtained without intravenous contrast. COMPARISON:  Head CT same day FINDINGS: Brain: Diffusion imaging shows a 1.5 cm acute infarction in the right periventricular deep white matter. No other acute infarction. Elsewhere, there chronic small-vessel ischemic changes of the pons. No focal cerebellar insult. Cerebral hemispheres show chronic small-vessel ischemic changes throughout the thalami, basal ganglia and hemispheric white matter. No large vessel territory infarction. No mass lesion, hemorrhage or hydrocephalus. No extra-axial collection. Vascular: Major vessels at the base of the brain show flow. Skull and upper cervical spine: Negative other than degenerative  changes at the C1-2 articulation. Sinuses/Orbits: Mucosal inflammatory changes of the left maxillary sinus. Orbits negative. Other: None significant IMPRESSION: Acute 1.5 cm infarction within the periventricular deep  white matter on the right adjacent to the posterior body of the lateral ventricle. No hemorrhage or swelling. Chronic small-vessel ischemic changes elsewhere throughout the brain as outlined above. Electronically Signed   By: Paulina FusiMark  Shogry M.D.   On: 07/29/2017 15:12      IMPRESSION AND PLAN:   Acute CVA. The patient will be admitted to medical floor. Continue aspirin and Lipitor.  Neuro check and neurology consult. Check lipid panel, echocardiogram and carotid duplex.  PT and OT evaluation.  Hypertension emergency.  Continue home hypertension medication, IV hydralazine as needed.  Hyponatremia.  Start normal saline IV in the follow-up BMP.  Hold HCTZ.  Hypokalemia, give potassium supplement in the follow-up level.  GERD.  Start Protonix and Maalox as needed.  All the records are reviewed and case discussed with ED provider. Management plans discussed with the patient, her son and they are in agreement.  CODE STATUS: DNR  TOTAL TIME TAKING CARE OF THIS PATIENT: 62 minutes.    Shaune Pollackhen, Oluwatobiloba Martin M.D on 07/29/2017 at 5:13 PM  Between 7am to 6pm - Pager - [phone]  After 6pm go to [email or web address] - Social research officer, [email or web address] [email or web address]  [facility] [place] [facility]  Office  [phone]  CC: Primary care physician; Kandyce RudBabaoff, Marcus, MD   Note: This dictation was prepared with Dragon dictation along with smaller phrase technology. Any transcriptional errors that result from this process are unin

## 2017-07-29 NOTE — ED Triage Notes (Signed)
Pt arrives via EMS from home, states multiple falls since Sunday night and a headache, states her legs feel weak, left leg more then right, pt awake and alert in no acute distress, son at bedside

## 2017-07-29 NOTE — ED Provider Notes (Signed)
[facility] Emergency Department Provider Note  Time seen: 3:24 PM  I have reviewed the triage vital signs and the nursing notes.   HISTORY  Chief Complaint Weakness    HPI Debbie Hampton is a 83 y.o. female with a past medical history of hypertension, gastric reflux, presents to the emergency department for left-sided weakness.  According to the patient over the past 3 days the patient has been feeling more weak on her left side and has had 3 falls, son states the patient does not fall asleep.  Patient states on Saturday she was feeling normal they went to a family reunion however on Sunday she awoke feeling like the left side of her body was somewhat weak, twice on Sunday she fell and then once today she fell to the son brought her to the emergency department for evaluation.  Patient is only past medical history is gastric reflux and hypertension.  Patient states her blood pressure runs around 200 systolic at times.  Patient denies any pain.  Denies chest pain, abdominal pain, recent nausea vomiting or diarrhea.  Denies dysuria.  Past Medical History:  Diagnosis Date  . GERD (gastroesophageal reflux disease)   . Hypertension     Patient Active Problem List   Diagnosis Date Noted  . Syncope, near 03/31/2016    Past Surgical History:  Procedure Laterality Date  . TONSILLECTOMY    . TUMOR REMOVAL     ovaries    Prior to Admission medications   Medication Sig Start Date End Date Taking? Authorizing Provider  atenolol (TENORMIN) 100 MG tablet Take 1 tablet (100 mg total) by mouth daily. 04/03/16   Shaune Pollackhen, Qing, MD  esomeprazole (NEXIUM) 40 MG capsule Take 40 mg by mouth daily. Over the counter extended release    [provider]  hydrALAZINE (APRESOLINE) 25 MG tablet Take 1 tablet (25 mg total) by mouth 3 (three) times daily. 04/03/16   Shaune Pollackhen, Qing, MD  hydrochlorothiazide (HYDRODIURIL) 25 MG tablet Take 12.5 mg by mouth daily.     [provider]   lisinopril (PRINIVIL,ZESTRIL) 40 MG tablet Take 40 mg by mouth daily. 02/22/16   [provider]    Allergies  Allergen Reactions  . Penicillins     Has patient had a PCN reaction causing immediate rash, facial/tongue/throat swelling, SOB or lightheadedness with hypotension: Yes Has patient had a PCN reaction causing severe rash involving mucus membranes or skin necrosis: No Has patient had a PCN reaction that required hospitalization No Has patient had a PCN reaction occurring within the last 10 years: Yes If all of the above answers are "NO", then may proceed with Cephalosporin use.   . Aloe Hives and Rash  . Amlodipine Hives, Rash and Hypertension  . Cephalexin Rash  . Metoprolol Hives, Palpitations and Hypertension  . Miconazole Swelling and Rash  . Neosporin [Neomycin-Bacitracin Zn-Polymyx] Rash  . Trandolapril-Verapamil Hcl Er Rash    Family History  Problem Relation Age of Onset  . Hypertension Other     Social History Social History   Tobacco Use  . Smoking status: Never Smoker  . Smokeless tobacco: Never Used  Substance Use Topics  . Alcohol use: No  . Drug use: Not on file    Review of Systems Constitutional: Negative for fever. Cardiovascular: Negative for chest pain. Respiratory: Negative for shortness of breath. Gastrointestinal: Negative for abdominal pain, vomiting and diarrhea. Genitourinary: Negative for dysuria. Neurological: Negative for headache.  Negative for numbness, states subjective weakness especially  in the left leg. All other ROS negative  ____________________________________________   PHYSICAL EXAM:  VITAL SIGNS: ED Triage Vitals  Enc Vitals Group     BP 07/29/17 0955 (!) 230/60     Pulse Rate 07/29/17 0955 66     Resp 07/29/17 0955 18     Temp 07/29/17 0955 98.2 F (36.8 C)     Temp Source 07/29/17 0955 Oral     SpO2 07/29/17 0955 99 %     Weight 07/29/17 0957 126 lb (57.2 kg)     Height 07/29/17 0957 5' (1.524 m)      Head Circumference --      Peak Flow --      Pain Score 07/29/17 1008 8     Pain Loc --      Pain Edu? --      Excl. in GC? --    Constitutional: Alert and oriented. Well appearing and in no distress. Eyes: Normal exam ENT   Head: Normocephalic and atraumatic.   Mouth/Throat: Mucous membranes are moist. Cardiovascular: Normal rate, regular rhythm.  Respiratory: Normal respiratory effort without tachypnea nor retractions. Breath sounds are clear Gastrointestinal: Soft and nontender. No distention.   Musculoskeletal: Nontender with normal range of motion in all extremities.  Neurologic:  Normal speech and language.  On exam the patient has a slight left upper extremity drift after 2 seconds.  Patient also has a left lower extremity drift after 2 seconds.  Cranial nerves intact.  Patient does have equal grip strength bilaterally.  Sensation intact and equal bilaterally. Skin:  Skin is warm, dry and intact.  Psychiatric: Mood and affect are normal.   ____________________________________________    EKG  EKG reviewed and interpreted by myself shows sinus bradycardia 58 bpm, narrow QRS, normal axis, normal intervals, nonspecific ST changes without ST elevation.  ____________________________________________    RADIOLOGY  CT head is negative for acute abnormality  MRI shows acute 1.5 cm infarct. ____________________________________________   INITIAL IMPRESSION / ASSESSMENT AND PLAN / ED COURSE  Pertinent labs & imaging results that were available during my care of the patient were reviewed by me and considered in my medical decision making (see chart for details).  Patient presents to the emergency department for left-sided weakness times 3 days.  On exam patient does have left upper and lower extremity weakness, however slight.  The patient's initial workup including labs and CT scan are largely normal.  However given the patient's symptoms and MRI has been ordered.  MRI  shows acute infarct.  We will dose aspirin.  Patient will be admitted for further treatment.  NIH Stroke Scale   Interval: Baseline Time: 3:31 PM Person Administering Scale: Daivon Rayos  Administer stroke scale items in the order listed. Record performance in each category after each subscale exam. Do not go back and change scores. Follow directions provided for each exam technique. Scores should reflect what the patient does, not what the clinician thinks the patient can do. The clinician should record answers while administering the exam and work quickly. Except where indicated, the patient should not be coached (i.e., repeated requests to patient to make a special effort).   1a  Level of consciousness: 0=alert; keenly responsive  1b. LOC questions:  0=Performs both tasks correctly  1c. LOC commands: 0=Performs both tasks correctly  2.  Best Gaze: 0=normal  3.  Visual: 0=No visual loss  4. Facial Palsy: 0=Normal symmetric movement  5a.  Motor left arm: 1=Drift, limb holds 90 (or  45) degrees but drifts down before full 10 seconds: does not hit bed  5b.  Motor right arm: 0=No drift, limb holds 90 (or 45) degrees for full 10 seconds  6a. motor left leg: 1=Drift, limb holds 90 (or 45) degrees but drifts down before full 10 seconds: does not hit bed  6b  Motor right leg:  0=No drift, limb holds 90 (or 45) degrees for full 10 seconds  7. Limb Ataxia: 1=Present in one limb  8.  Sensory: 0=Normal; no sensory loss  9. Best Language:  0=No aphasia, normal  10. Dysarthria: 0=Normal  11. Extinction and Inattention: 0=No abnormality  12. Distal motor function: 0=Normal   Total:   3    ____________________________________________   FINAL CLINICAL IMPRESSION(S) / ED DIAGNOSES  Acute CVA    Minna AntisPaduchowski, Addilyne Backs, MD 07/29/17 1533

## 2017-07-29 NOTE — ED Notes (Signed)
Pt states hands are currently tingling

## 2017-07-30 ENCOUNTER — Inpatient Hospital Stay: Payer: Medicare Other

## 2017-07-30 ENCOUNTER — Other Ambulatory Visit: Payer: Self-pay

## 2017-07-30 ENCOUNTER — Inpatient Hospital Stay (HOSPITAL_COMMUNITY)
Admit: 2017-07-30 | Discharge: 2017-07-30 | Disposition: A | Discharge status: Home / Self Care | Payer: Medicare Other | Attending: Internal Medicine | Admitting: Internal Medicine

## 2017-07-30 DIAGNOSIS — I639 Cerebral infarction, unspecified: Principal | ICD-10-CM

## 2017-07-30 DIAGNOSIS — I340 Nonrheumatic mitral (valve) insufficiency: Secondary | ICD-10-CM

## 2017-07-30 LAB — LIPID PANEL
CHOL/HDL RATIO: 2.7 ratio
CHOLESTEROL: 186 mg/dL (ref 0–200)
HDL: 70 mg/dL (ref >40)
LDL Cholesterol: 95 mg/dL (ref 0–99)
TRIGLYCERIDES: 103 mg/dL (ref <150)
VLDL: 21 mg/dL (ref 0–40)

## 2017-07-30 LAB — BASIC METABOLIC PANEL
ANION GAP: 9 (ref 5–15)
BUN: 15 mg/dL (ref 6–20)
CALCIUM: 8.8 mg/dL — AB (ref 8.9–10.3)
CO2: 25 mmol/L (ref 22–32)
Chloride: 94 mmol/L — ABNORMAL LOW (ref 101–111)
Creatinine, Ser: 0.72 mg/dL (ref 0.44–1.00)
GFR calc non Af Amer: >60 mL/min (ref >60)
GLUCOSE: 93 mg/dL (ref 65–99)
POTASSIUM: 3.3 mmol/L — AB (ref 3.5–5.1)
Sodium: 128 mmol/L — ABNORMAL LOW (ref 135–145)

## 2017-07-30 LAB — ECHOCARDIOGRAM COMPLETE
Height: 60 in
Weight: 1886.4 oz

## 2017-07-30 LAB — HEMOGLOBIN A1C
Hgb A1c MFr Bld: 5.9 % — ABNORMAL HIGH (ref 4.8–5.6)
MEAN PLASMA GLUCOSE: 122.63 mg/dL

## 2017-07-30 MED ORDER — CLOPIDOGREL BISULFATE 75 MG PO TABS
75.0000 mg | ORAL_TABLET | Freq: Every day | ORAL | Status: DC
  Administered 2017-07-31 – 2017-08-01 (×2): 75 mg via ORAL
  Filled 2017-07-30 (×2): qty 1

## 2017-07-30 MED ORDER — IOPAMIDOL (ISOVUE-370) INJECTION 76%
75.0000 mL | Freq: Once | INTRAVENOUS | Status: AC | PRN
  Administered 2017-07-30: 75 mL via INTRAVENOUS

## 2017-07-30 MED ORDER — METAXALONE 800 MG PO TABS
800.0000 mg | ORAL_TABLET | Freq: Once | ORAL | Status: DC | PRN
  Filled 2017-07-30: qty 1

## 2017-07-30 MED ORDER — ASPIRIN EC 81 MG PO TBEC
81.0000 mg | DELAYED_RELEASE_TABLET | Freq: Every day | ORAL | Status: DC
  Administered 2017-07-31 – 2017-08-01 (×2): 81 mg via ORAL
  Filled 2017-07-30 (×2): qty 1

## 2017-07-30 NOTE — Progress Notes (Signed)
*  PRELIMINARY RESULTS* Echocardiogram 2D Echocardiogram has been performed.  Cristela BlueHege, Charan Prieto 07/30/2017, 3:54 PM

## 2017-07-30 NOTE — Progress Notes (Signed)
[facility] - [place] [facility]   PATIENT NAME: Debbie Hampton    MR#:  [id number]  DATE OF BIRTH:  Aug 09, 1932  SUBJECTIVE:   Patient presents with left lower extremity weakness  REVIEW OF SYSTEMS:    Review of Systems  Constitutional: Negative for fever, chills weight loss HENT: Negative for ear pain, nosebleeds, congestion, facial swelling, rhinorrhea, neck pain, neck stiffness and ear discharge.   Respiratory: Negative for cough, shortness of breath, wheezing  Cardiovascular: Negative for chest pain, palpitations and leg swelling.  Gastrointestinal: Negative for heartburn, abdominal pain, vomiting, diarrhea or consitpation Genitourinary: Negative for dysuria, urgency, frequency, hematuria Musculoskeletal: Negative for back pain or joint pain Neurological: Negative for dizziness, seizures, syncope,  numbness and headaches. Positive left lower extremity weakness Hematological: Does not bruise/bleed easily.  Psychiatric/Behavioral: Negative for hallucinations, confusion, dysphoric mood    Tolerating Diet: yes      DRUG ALLERGIES:   Allergies  Allergen Reactions  . Penicillins     Has patient had a PCN reaction causing immediate rash, facial/tongue/throat swelling, SOB or lightheadedness with hypotension: Yes Has patient had a PCN reaction causing severe rash involving mucus membranes or skin necrosis: No Has patient had a PCN reaction that required hospitalization No Has patient had a PCN reaction occurring within the last 10 years: Yes If all of the above answers are "NO", then may proceed with Cephalosporin use.   . Aloe Hives and Rash  . Amlodipine Hives, Rash and Hypertension  . Cephalexin Rash  . Metoprolol Hives, Palpitations and Hypertension  . Miconazole Swelling and Rash  . Neosporin [Neomycin-Bacitracin Zn-Polymyx] Rash  . Trandolapril-Verapamil Hcl Er Rash    VITALS:  Blood pressure (!) 138/47, pulse 73, temperature (!) 97.5 F (36.4  C), resp. rate 18, height 5' (1.524 m), weight 53.5 kg (117 lb 14.4 oz), SpO2 99 %.  PHYSICAL EXAMINATION:  Constitutional: Appears well-developed and well-nourished. No distress. HENT: Normocephalic. Marland Kitchen Oropharynx is clear and moist.  Eyes: Conjunctivae and EOM are normal. PERRLA, no scleral icterus.  Neck: Normal ROM. Neck supple. No JVD. No tracheal deviation. CVS: RRR, S1/S2 +, no murmurs, no gallops, no carotid bruit.  Pulmonary: Effort and breath sounds normal, no stridor, rhonchi, wheezes, rales.  Abdominal: Soft. BS +,  no distension, tenderness, rebound or guarding.  Musculoskeletal: Normal range of motion. No edema and no tenderness.  Neuro: Alert. CN 2-12 grossly intact. No focal deficits. Skin: Skin is warm and dry. No rash noted. Psychiatric: Normal mood and affect.      LABORATORY PANEL:   CBC Recent Labs  Lab 07/29/17 1006  WBC 7.0  HGB 12.4  HCT 37.2  PLT 251   ------------------------------------------------------------------------------------------------------------------  Chemistries  Recent Labs  Lab 07/30/17 0513  NA 128*  K 3.3*  CL 94*  CO2 25  GLUCOSE 93  BUN 15  CREATININE 0.72  CALCIUM 8.8*   ------------------------------------------------------------------------------------------------------------------  Cardiac Enzymes No results for input(s): TROPONINI in the last 168 hours. ------------------------------------------------------------------------------------------------------------------  RADIOLOGY:  Ct Head Wo Contrast  Result Date: 07/29/2017 CLINICAL DATA:  Posttraumatic headache after multiple falls. Bilateral lower extremity weakness. EXAM: CT HEAD WITHOUT CONTRAST TECHNIQUE: Contiguous axial images were obtained from the base of the skull through the vertex without intravenous contrast. COMPARISON:  CT scan of March 31, 2016. FINDINGS: Brain: Mild chronic ischemic white matter disease is noted. No mass effect or midline shift is  noted. Ventricular size is within normal limits. There is no evidence of mass lesion, hemorrhage or  acute infarction. Vascular: No hyperdense vessel or unexpected calcification. Skull: Normal. Negative for fracture or focal lesion. Sinuses/Orbits: Mild left maxillary sinusitis is noted. Other: None. IMPRESSION: Mild chronic ischemic white matter disease. No acute intracranial abnormality seen. Electronically Signed   By: Lupita RaiderJames  Green Jr, M.D.   On: 07/29/2017 10:50   Mr Brain Wo Contrast  Result Date: 07/29/2017 CLINICAL DATA:  Multiple falls over the last several days. Headache. Lower extremity weakness. EXAM: MRI HEAD WITHOUT CONTRAST TECHNIQUE: Multiplanar, multiecho pulse sequences of the brain and surrounding structures were obtained without intravenous contrast. COMPARISON:  Head CT same day FINDINGS: Brain: Diffusion imaging shows a 1.5 cm acute infarction in the right periventricular deep white matter. No other acute infarction. Elsewhere, there chronic small-vessel ischemic changes of the pons. No focal cerebellar insult. Cerebral hemispheres show chronic small-vessel ischemic changes throughout the thalami, basal ganglia and hemispheric white matter. No large vessel territory infarction. No mass lesion, hemorrhage or hydrocephalus. No extra-axial collection. Vascular: Major vessels at the base of the brain show flow. Skull and upper cervical spine: Negative other than degenerative changes at the C1-2 articulation. Sinuses/Orbits: Mucosal inflammatory changes of the left maxillary sinus. Orbits negative. 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 No hemorrhage or swelling. Chronic small-vessel ischemic changes elsewhere throughout the brain as outlined above. Electronically Signed   By: Paulina FusiMark  Shogry M.D.   On: 07/29/2017 15:12   [place] Carotid Bilateral (at [facility]  Only)  Result Date: 07/30/2017 CLINICAL DATA:  Stroke.  History of hypertension. EXAM: BILATERAL CAROTID DUPLEX ULTRASOUND TECHNIQUE: Wallace CullensGray scale imaging, color Doppler and duplex ultrasound were performed of bilateral carotid and vertebral arteries in the neck. COMPARISON:  Brain MRI - 07/29/2017 FINDINGS: Criteria: Quantification of carotid stenosis is based on velocity parameters that correlate the residual internal carotid diameter with NASCET-based stenosis levels, using the diameter of the distal internal carotid lumen as the denominator for stenosis measurement. The following velocity measurements were obtained: RIGHT ICA:  2 or 9/49 cm/sec CCA:  100/8 cm/sec SYSTOLIC ICA/CCA RATIO:  21 DIASTOLIC ICA/CCA RATIO:  62 ECA:  129 cm/sec LEFT ICA:  131/27 cm/sec CCA:  137/9 cm/sec SYSTOLIC ICA/CCA RATIO:  1.0 DIASTOLIC ICA/CCA RATIO:  3.0 ECA:  111 cm/sec RIGHT CAROTID ARTERY: There is a minimal amount of atherosclerotic plaque involving the proximal aspect the right common carotid artery (image 5). The right common carotid artery is noted to be tortuous (image 7). There is a minimal amount of atherosclerotic plaque involving the distal aspect the right common carotid artery (image 16). There is a moderate to large amount of echogenic partially shadowing plaque within the right carotid bulb (image 21), extending to involve the origin and proximal aspects of the right internal carotid artery origin and proximal aspects of the right internal carotid artery (image 29), resulting in elevated peak systolic velocities throughout the right internal carotid artery. Greatest acquired peak systolic velocity within the proximal right internal carotid artery measures 209 cm/sec - image 32. RIGHT VERTEBRAL ARTERY:  Antegrade flow LEFT CAROTID ARTERY: There is a moderate amount of atherosclerotic plaque within the left carotid bulb (image 59), extending to involve the origin and proximal aspects of the left internal carotid  artery (image 67), resulting in elevated peak systolic velocities within the mid aspect the left internal carotid artery. Greatest acquired peak systolic velocity within the left mid ICA measures 131  cm/sec - image 72. LEFT VERTEBRAL ARTERY:  Antegrade flow There is a potential minimal amount of nonocclusive wall thickening involving the proximal aspect the left internal jugular vein. IMPRESSION: Moderate to large amount of bilateral atherosclerotic plaque results in elevated peak systolic velocities within bilateral internal carotid arteries compatible with the 50-69% luminal narrowing range bilaterally, right greater than left. Further evaluation with CTA could performed as clinically indicated. Electronically Signed   By: Simonne ComeJohn  Watts M.D.   On: 07/30/2017 11:41     ASSESSMENT AND PLAN:   83 year old female with essential hypertension who presents with left lower extremity weakness.  1. 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 Essential hypertension: Given acuity of CVA I would allow some brain perfusion. Continue hydralazine however will discontinue for now atenolol with plans of restarting this in the next 1-2 days.  3. GERD: Continue PPI  4. Hypokalemia: This was repleted Management plans discussed with the patient and she is in agreement.  CODE STATUS: dnr  TOTAL TIME TAKING CARE OF THIS PATIENT: 30 minutes.   D/w son  POSSIBLE D/C 1-2  days, DEPENDING ON CLINICAL CONDITION.   Jhalil Silvera M.D on 07/30/2017 at 12:37 PM  Between 7am to 6pm - Pager - [phone] After 6pm go to [email or web address] - password [email or web address]  Office  [phone]  CC: Primary care physician; Kandyce RudBabaoff, Marcus, MD  Note: This dictation was prepared with Dragon dictation along with smaller phrase technology. Any transcriptional errors that result from this process are unintentional.

## 2017-07-30 NOTE — Progress Notes (Signed)
PT Cancellation Note  Patient Details Name: Debbie BalesCarol Hampton MRN: [id number] DOB: 12-12-1931   Cancelled Treatment:    Reason Eval/Treat Not Completed: Patient at procedure or test/unavailable(Consult received and chart reviewed.  Patient currently off unit for diagnostic testing at this time.  Will continue efforts at later time/date as patient available/appropriate.)  Angalina Ante H. Manson PasseyBrown, PT, DPT, NCS 07/30/17, 9:58 AM [phone]

## 2017-07-30 NOTE — Consult Note (Signed)
Referring Physician: Mody    Chief Complaint: Left sided weakness  HPI: Debbie Hampton is an 83 y.o. female with a history of hypertension who reports that on Sunday she began to have difficulty using her LLE.  This continued throughout the day but the patient remained functional.  Went to bed Sunday with a pail beside her bed to use the bathroom in during the night.  When she awakened was unable to stand and used the bathroom in the pail.  When she attempted to empty the pail was unable to hold it.  Found it continually difficult to stand and therefore called EMS.  Initial NIHSS of 1.    Date last known well: Date: 07/27/2017 Time last known well: Time: 10:30 tPA Given: No: Outside time window  Past Medical History:  Diagnosis Date  . GERD (gastroesophageal reflux disease)   . Hypertension     Past Surgical History:  Procedure Laterality Date  . TONSILLECTOMY    . TUMOR REMOVAL     ovaries    Family History  Problem Relation Age of Onset  . Hypertension Other    Social History:  reports that  has never smoked. she has never used smokeless tobacco. She reports that she does not drink alcohol or use drugs.  Allergies:  Allergies  Allergen Reactions  . Penicillins     Has patient had a PCN reaction causing immediate rash, facial/tongue/throat swelling, SOB or lightheadedness with hypotension: Yes Has patient had a PCN reaction causing severe rash involving mucus membranes or skin necrosis: No Has patient had a PCN reaction that required hospitalization No Has patient had a PCN reaction occurring within the last 10 years: Yes If all of the above answers are "NO", then may proceed with Cephalosporin use.   . Aloe Hives and Rash  . Amlodipine Hives, Rash and Hypertension  . Cephalexin Rash  . Metoprolol Hives, Palpitations and Hypertension  . Miconazole Swelling and Rash  . Neosporin [Neomycin-Bacitracin Zn-Polymyx] Rash  . Trandolapril-Verapamil Hcl Er Rash    Medications:   I have reviewed the patient's current medications. Prior to Admission:  Medications Prior to Admission  Medication Sig Dispense Refill Last Dose  . atenolol (TENORMIN) 100 MG tablet Take 1 tablet (100 mg total) by mouth daily. 30 tablet 1 07/29/2017 at 0800  . esomeprazole (NEXIUM) 40 MG capsule Take 40 mg by mouth daily. Over the counter extended release   07/29/2017 at 0800  . hydrALAZINE (APRESOLINE) 25 MG tablet Take 1 tablet (25 mg total) by mouth 3 (three) times daily. 90 tablet 1 07/29/2017 at 0800  . hydrochlorothiazide (HYDRODIURIL) 25 MG tablet Take 12.5 mg by mouth daily.    07/29/2017 at 0800  . lisinopril (PRINIVIL,ZESTRIL) 40 MG tablet Take 40 mg by mouth daily.  3 07/29/2017 at 0800   Scheduled: . aspirin EC  325 mg Oral Daily  . atenolol  100 mg Oral Daily  . enoxaparin (LOVENOX) injection  40 mg Subcutaneous Q24H  . hydrALAZINE  25 mg Oral TID  . Influenza vac split quadrivalent PF  0.5 mL Intramuscular Tomorrow-1000  . lisinopril  40 mg Oral Daily  . pantoprazole  40 mg Oral Daily  . potassium chloride  40 mEq Oral Once    ROS: History obtained from the patient  General ROS: negative for - chills, fatigue, fever, night sweats, weight gain or weight loss Psychological ROS: negative for - behavioral disorder, hallucinations, memory difficulties, mood swings or suicidal ideation Ophthalmic ROS: negative for -  blurry vision, double vision, eye pain or loss of vision ENT ROS: decreased hearing bilaterally Allergy and Immunology ROS: negative for - hives or itchy/watery eyes Hematological and Lymphatic ROS: negative for - bleeding problems, bruising or swollen lymph nodes Endocrine ROS: negative for - galactorrhea, hair pattern changes, polydipsia/polyuria or temperature intolerance Respiratory ROS: negative for - cough, hemoptysis, shortness of breath or wheezing Cardiovascular ROS: negative for - chest pain, dyspnea on exertion, edema or irregular  heartbeat Gastrointestinal ROS: negative for - abdominal pain, diarrhea, hematemesis, nausea/vomiting or stool incontinence Genito-Urinary ROS: negative for - dysuria, hematuria, incontinence or urinary frequency/urgency Musculoskeletal ROS: bilateral shoulder pain and decreased ROM Neurological ROS: as noted in HPI Dermatological ROS: negative for rash and skin lesion changes  Physical Examination: Blood pressure (!) 138/47, pulse 73, temperature (!) 97.5 F (36.4 C), resp. rate 18, height 5' (1.524 m), weight 53.5 kg (117 lb 14.4 oz), SpO2 99 %.  HEENT-  Normocephalic, no lesions, without obvious abnormality.  Normal external eye and conjunctiva.  Normal TM's bilaterally.  Normal auditory canals and external ears. Normal external nose, mucus membranes and septum.  Normal pharynx. Cardiovascular- S1, S2 normal, pulses palpable throughout   Lungs- chest clear, no wheezing, rales, normal symmetric air entry Abdomen- soft, non-tender; bowel sounds normal; no masses,  no organomegaly Extremities- no edema Lymph-no adenopathy palpable Musculoskeletal-decreased ROM at the shoulders bilaterally Skin-warm and dry, no hyperpigmentation, vitiligo, or suspicious lesions  Neurological Examination   Mental Status: Alert, oriented, but some issues with memory.  Speech fluent but purposeful.  Able to follow 3 step commands with some reinforcement Cranial Nerves: II: Discs flat bilaterally; Visual fields grossly normal, pupils equal, round, reactive to light and accommodation III,IV, VI: ptosis not present, extra-ocular motions intact bilaterally with decreased upward excursion with the right eye V,VII: smile symmetric, facial light touch sensation normal bilaterally VIII: hearing decreased bilaterally IX,X: gag reflex present XI: bilateral shoulder shrug XII: midline tongue extension Motor: Right : Upper extremity   5-/5    Left:     Upper extremity   5-/5  Lower extremity   5/5     Lower  extremity   5/5 Tone and bulk:normal tone throughout; no atrophy noted Sensory: Pinprick and light touch intact throughout, bilaterally Deep Tendon Reflexes: 2+ throughout with absent right AJ Plantars: Right: mute   Left: upgoing Cerebellar: Dysmetria with finger-to-nose and heel-to-shin testing on the left Gait: not tested due to safety concerns    Laboratory Studies:  Basic Metabolic Panel: Recent Labs  Lab 07/29/17 1006 07/30/17 0513  NA 127* 128*  K 3.4* 3.3*  CL 90* 94*  CO2 27 25  GLUCOSE 140* 93  BUN 9 15  CREATININE 0.57 0.72  CALCIUM 9.9 8.8*    Liver Function Tests: No results for input(s): AST, ALT, ALKPHOS, BILITOT, PROT, ALBUMIN in the last 168 hours. No results for input(s): LIPASE, AMYLASE in the last 168 hours. No results for input(s): AMMONIA in the last 168 hours.  CBC: Recent Labs  Lab 07/29/17 1006  WBC 7.0  HGB 12.4  HCT 37.2  MCV 81.7  PLT 251    Cardiac Enzymes: No results for input(s): CKTOTAL, CKMB, CKMBINDEX, TROPONINI in the last 168 hours.  BNP: Invalid input(s): POCBNP  CBG: No results for input(s): GLUCAP in the last 168 hours.  Microbiology: No results found for this or any previous visit.  Coagulation Studies: No results for input(s): LABPROT, INR in the last 72 hours.  Urinalysis:  Recent Labs  Lab 07/29/17 1342  COLORURINE STRAW*  LABSPEC 1.006  PHURINE 7.0  GLUCOSEU NEGATIVE  HGBUR NEGATIVE  BILIRUBINUR NEGATIVE  KETONESUR 5*  PROTEINUR NEGATIVE  NITRITE NEGATIVE  LEUKOCYTESUR NEGATIVE    Lipid Panel:    Component Value Date/Time   CHOL 186 07/30/2017 0513   TRIG 103 07/30/2017 0513   HDL 70 07/30/2017 0513   CHOLHDL 2.7 07/30/2017 0513   VLDL 21 07/30/2017 0513   LDLCALC 95 07/30/2017 0513    HgbA1C:  Lab Results  Component Value Date   HGBA1C 5.9 (H) 07/30/2017    Urine Drug Screen:  No results found for: LABOPIA, COCAINSCRNUR, LABBENZ, AMPHETMU, THCU, LABBARB  Alcohol Level: No results  for input(s): ETH in the last 168 hours.  Other results: EKG: sinus bradycardia at 58 bpm.  Imaging: Ct Head Wo Contrast  Result Date: 07/29/2017 CLINICAL DATA:  Posttraumatic headache after multiple falls. Bilateral lower extremity weakness. EXAM: CT HEAD WITHOUT CONTRAST TECHNIQUE: Contiguous axial images were obtained from the base of the skull through the vertex without intravenous contrast. COMPARISON:  CT scan of March 31, 2016. FINDINGS: Brain: Mild chronic ischemic white matter disease is noted. No mass effect or midline shift is noted. Ventricular size is within normal limits. There is no evidence of mass lesion, hemorrhage or acute infarction. Vascular: No hyperdense vessel or unexpected calcification. Skull: Normal. Negative for fracture or focal lesion. Sinuses/Orbits: Mild left maxillary sinusitis is noted. Other: None. IMPRESSION: Mild chronic ischemic white matter disease. No acute intracranial abnormality seen. Electronically Signed   By: Lupita Raider, M.D.   On: 07/29/2017 10:50   Mr Brain Wo Contrast  Result Date: 07/29/2017 CLINICAL DATA:  Multiple falls over the last several days. Headache. Lower extremity weakness. EXAM: MRI HEAD WITHOUT CONTRAST TECHNIQUE: Multiplanar, multiecho pulse sequences of the brain and surrounding structures were obtained without intravenous contrast. COMPARISON:  Head CT same day FINDINGS: Brain: Diffusion imaging shows a 1.5 cm acute infarction in the right periventricular deep white matter. No other acute infarction. Elsewhere, there chronic small-vessel ischemic changes of the pons. No focal cerebellar insult. Cerebral hemispheres show chronic small-vessel ischemic changes throughout the thalami, basal ganglia and hemispheric white matter. No large vessel territory infarction. No mass lesion, hemorrhage or hydrocephalus. No extra-axial collection. Vascular: Major vessels at the base of the brain show flow. Skull and upper cervical spine: Negative  other than degenerative changes at the C1-2 articulation. Sinuses/Orbits: Mucosal inflammatory changes of the left maxillary sinus. Orbits negative. 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 No hemorrhage or swelling. Chronic small-vessel ischemic changes elsewhere throughout the brain as outlined above. Electronically Signed   By: Paulina Fusi M.D.   On: 07/29/2017 15:12   US Carotid Bilateral (at [facility] And [facility] Only)  Result Date: 07/30/2017 CLINICAL DATA:  Stroke.  History of hypertension. EXAM: BILATERAL CAROTID DUPLEX ULTRASOUND TECHNIQUE: Wallace Cullens scale imaging, color Doppler and duplex ultrasound were performed of bilateral carotid and vertebral arteries in the neck. COMPARISON:  Brain MRI - 07/29/2017 FINDINGS: Criteria: Quantification of carotid stenosis is based on velocity parameters that correlate the residual internal carotid diameter with NASCET-based stenosis levels, using the diameter of the distal internal carotid lumen as the denominator for stenosis measurement. The following velocity measurements were obtained: RIGHT ICA:  2 or 9/49 cm/sec CCA:  100/8 cm/sec SYSTOLIC ICA/CCA RATIO:  21 DIASTOLIC ICA/CCA RATIO:  62  ECA:  129 cm/sec LEFT ICA:  131/27 cm/sec CCA:  137/9 cm/sec SYSTOLIC ICA/CCA RATIO:  1.0 DIASTOLIC ICA/CCA RATIO:  3.0 ECA:  111 cm/sec RIGHT CAROTID ARTERY: There is a minimal amount of atherosclerotic plaque involving the proximal aspect the right common carotid artery (image 5). The right common carotid artery is noted to be tortuous (image 7). There is a minimal amount of atherosclerotic plaque involving the distal aspect the right common carotid artery (image 16). There is a moderate to large amount of echogenic partially shadowing plaque within the right carotid bulb (image 21), extending to involve the origin and proximal aspects of the right  internal carotid artery origin and proximal aspects of the right internal carotid artery (image 29), resulting in elevated peak systolic velocities throughout the right internal carotid artery. Greatest acquired peak systolic velocity within the proximal right internal carotid artery measures 209 cm/sec - image 32. RIGHT VERTEBRAL ARTERY:  Antegrade flow LEFT CAROTID ARTERY: There is a moderate amount of atherosclerotic plaque within the left carotid bulb (image 59), extending to involve the origin and proximal aspects of the left internal carotid artery (image 67), resulting in elevated peak systolic velocities within the mid aspect the left internal carotid artery. Greatest acquired peak systolic velocity within the left mid ICA measures 131 cm/sec - image 72. LEFT VERTEBRAL ARTERY:  Antegrade flow There is a potential minimal amount of nonocclusive wall thickening involving the proximal aspect the left internal jugular vein. IMPRESSION: Moderate to large amount of bilateral atherosclerotic plaque results in elevated peak systolic velocities within bilateral internal carotid arteries compatible with the 50-69% luminal narrowing range bilaterally, right greater than left. Further evaluation with CTA could performed as clinically indicated. Electronically Signed   By: Simonne ComeJohn  Watts M.D.   On: 07/30/2017 11:41    Assessment: 83 y.o. female presenting with complaints of left sided weakness.  On no antiplatelet therapy at home.  MRI of the brain reviewed and shows an acute infarct adjacent to the right lateral ventricle.  Likely due to small vessel disease.  Carotid dopplers show bilateral ICA stenosis at 50-69% stenosis.  Echocardiogram is pending.  A1c 5.9.  LDL 95.    Stroke Risk Factors - hypertension  Plan: 1. Statin for lipid management with target LDL<70. 2. CTA of the head and neck 3. PT consult, OT consult, Speech consult 4. Echocardiogram pending 5. Prophylactic therapy-ASA 81mg  and Plavix 75mg   daily 6. Telemetry monitoring 7. Frequent neuro checks   Thana FarrLeslie Kaylor Simenson, MD Neurology [phone] 07/30/2017, 12:04 PM

## 2017-07-30 NOTE — Progress Notes (Signed)
OT Cancellation Note  Patient Details Name: Debbie Hampton MRN: [id number] DOB: May 01, 1932   Cancelled Treatment:    Reason Eval/Treat Not Completed: Patient at procedure or test/ unavailable. On 2nd attempt, pt out of room for testing. Will re-attempt next date as appropriate.  Richrd PrimeJamie Stiller, MPH, MS, OTR/L ascom [phone] 07/30/17, 2:09 PM

## 2017-07-30 NOTE — Care Management Note (Signed)
Case Management Note  Patient Details  Name: Debbie Hampton MRN: [id number] Date of Birth: December 23, 1931  Subjective/Objective:   Admitted to [facility] with stroke. Lives with husband, Toss [phone]). 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 Sons are Gara KronerWestley [phone]) and Deniece PortelaWayne [phone]). No home health. No skilled facility. No home oxygen. No equipment for Debbie Hampton in the home.   Takes care of all basic and instrumental activities of daily living herself, drives. Larey SeatFell prior to this admission.            Action/Plan: Physical therapy evaluation pending, Information about personal care services, assisted living and skilled nursing facilities given to son, Gara KronerWestley   Expected Discharge Date:                  Expected Discharge Plan:     In-House Referral:     Discharge planning Services     Post Acute Care Choice:    Choice offered to:     DME Arranged:    DME Agency:     HH Arranged:    HH Agency:     Status of Service:     If discussed at [employer] Meetings, dates discussed:    Additional Comments:  Gwenette GreetBrenda S Caron Tardif, RN MSN CCM Care Management [phone] 07/30/2017, 3:28 PM

## 2017-07-30 NOTE — Progress Notes (Signed)
OT Cancellation Note  Patient Details Name: Debbie BalesCarol Hampton MRN: [id number] DOB: Aug 27, 1932   Cancelled Treatment:    Reason Eval/Treat Not Completed: Patient at procedure or test/ unavailable. Order received, chart reviewed. Pt out of room for testing. Will re-attempt OT evaluation at later date/time as pt is medically appropriate and as schedule permits.  Richrd PrimeJamie Stiller, MPH, MS, OTR/L ascom [phone] 07/30/17, 9:59 AM

## 2017-07-30 NOTE — Progress Notes (Signed)
PT Cancellation Note  Patient Details Name: Jones BalesCarol Matty MRN: [id number] DOB: 27-Feb-1932   Cancelled Treatment:    Reason Eval/Treat Not Completed: Patient at procedure or test/unavailable(Evaluation re-attempted.  Patient currently leaving unit for CTA.  Will re-attempt at later time/date.)   Extended discussion with son about his concerns, hopes for patient upon discharge.  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  He is very supportive of patient and would like mother to have opportunity to rehab/recover "away from my dad" so she can really focus on herself.  Briefly reviewed criteria for rehab.  Referred son to RNCM/CSW for further discussion of resources and options.   Nataliya Graig H. Manson PasseyBrown, PT, DPT, NCS 07/30/17, 2:04 PM [phone]

## 2017-07-30 NOTE — Progress Notes (Signed)
SLP Cancellation Note  Patient Details Name: Debbie Hampton MRN: [id number] DOB: [birth year]   Cancelled treatment:       Reason Eval/Treat Not Completed: SLP screened, no needs identified, will sign off(chart reviewed; consulted w/ NSG then met w/ pt/family). Pt denied any difficulty swallowing and is currently on a regular diet; tolerates swallowing pills w/ water per NSG. Pt conversed at conversational level w/out significant deficits noted; pt and family denied any new speech-language deficits - both pt and Son described that pt does have a somewhat exaggerated speech pattern but "no changes in speech since this thing happened to her", per Son.  No further skilled ST services indicated as pt appears at her baseline. Pt agreed. NSG to reconsult if any change in status.    Orinda Kenner, MS, CCC-SLP Trude Cansler 07/30/2017, 4:13 PM

## 2017-07-30 NOTE — Progress Notes (Signed)
PT Cancellation Note  Patient Details Name: Jones BalesCarol Matkins MRN: [id number] DOB: [birth year]   Cancelled Treatment:    Reason Eval/Treat Not Completed: Patient at procedure or test/unavailable(Third attempt to see patient this date per RN request.  Upon arrival to room, patient undergoing ECHO and unavailable to participate with treatment session.  Will continue efforts next date.)   Irineo Gaulin H. Manson PasseyBrown, PT, DPT, NCS 07/30/17, 3:59 PM [phone]

## 2017-07-30 NOTE — Progress Notes (Addendum)
Pt's son in room upset that pt did not get a warm dinner.  Requesting to speak to charge nurse. Charge nurse Aram BeechamCynthia to room.  Son requesting kpad for pt's shoulder, pt to take home medication nexium, Skelaxin for leg cramps, and for physician to call son while rounding.  Number is on board. Provided pt with sandwich, salad, fruit, soda and warm tomato soup. Spoke with Dr Casper HarrisonKonedina with order for k-pad, and one time dose of Skelaxin. K-pad set up. Per pharmacy the hospital policy is that if there is a comparable medication on formulary, the home medication is not allowed.  Informed pt of this and provided written note on board for son. Nexium bottle placed in patient's bin. Henriette CombsSarah Deward Sebek RN

## 2017-07-31 MED ORDER — ALPRAZOLAM 0.5 MG PO TABS
0.2500 mg | ORAL_TABLET | Freq: Three times a day (TID) | ORAL | Status: DC | PRN
  Administered 2017-07-31: 0.25 mg via ORAL
  Filled 2017-07-31 (×2): qty 1

## 2017-07-31 MED ORDER — NITROGLYCERIN 2 % TD OINT
0.5000 [in_us] | TOPICAL_OINTMENT | Freq: Four times a day (QID) | TRANSDERMAL | Status: DC
  Administered 2017-07-31: 0.5 [in_us] via TOPICAL
  Filled 2017-07-31 (×3): qty 1

## 2017-07-31 MED ORDER — HYDRALAZINE HCL 50 MG PO TABS
50.0000 mg | ORAL_TABLET | Freq: Once | ORAL | Status: AC
  Administered 2017-07-31: 05:00:00 50 mg via ORAL
  Filled 2017-07-31: qty 1

## 2017-07-31 MED ORDER — ATENOLOL 100 MG PO TABS
100.0000 mg | ORAL_TABLET | Freq: Every day | ORAL | Status: DC
  Administered 2017-07-31 – 2017-08-01 (×2): 100 mg via ORAL
  Filled 2017-07-31 (×2): qty 1

## 2017-07-31 MED ORDER — POLYETHYLENE GLYCOL 3350 17 G PO PACK
17.0000 g | PACK | Freq: Every day | ORAL | Status: DC
  Administered 2017-07-31 – 2017-08-01 (×2): 17 g via ORAL
  Filled 2017-07-31 (×2): qty 1

## 2017-07-31 NOTE — NC FL2 (Signed)
[place] MEDICAID FL2 LEVEL OF CARE SCREENING TOOL     IDENTIFICATION  Patient Name: Debbie Hampton Birthdate: Jan 02, 1932 Sex: female Admission Date (Current Location): 07/29/2017  [place]   Facility and Address:  [facility], [address], [place], [place] [address]      Provider Number: [id number]  Attending Physician Name and Address:  Adrian SaranMody, Sital, MD  Relative Name and Phone Number:       Current Level of Care: Hospital Recommended Level of Care: Skilled Nursing Facility Prior Approval Number:    Date Approved/Denied:   PASRR Number: [id number])  Discharge Plan: SNF    Current Diagnoses: Patient Active Problem List   Diagnosis Date Noted  . Acute CVA (cerebrovascular accident) (HCC) 07/29/2017  . Syncope, near 03/31/2016    Orientation RESPIRATION BLADDER Height & Weight     Self, Time, Situation, Place  Normal Continent Weight: 117 lb 14.4 oz (53.5 kg) Height:  5' (152.4 cm)  BEHAVIORAL SYMPTOMS/MOOD NEUROLOGICAL BOWEL NUTRITION STATUS      Continent Diet(2 Gram Sodium)  AMBULATORY STATUS COMMUNICATION OF NEEDS Skin   Extensive Assist Verbally Skin abrasions(Right Shoulder)                       Personal Care Assistance Level of Assistance  Bathing, Feeding, Dressing Bathing Assistance: Limited assistance Feeding assistance: Independent Dressing Assistance: Limited assistance     Functional Limitations Info  Sight, Hearing, Speech Sight Info: Adequate Hearing Info: Impaired Speech Info: Adequate    SPECIAL CARE FACTORS FREQUENCY  PT (By licensed PT), OT (By licensed OT)     PT Frequency: (5) OT Frequency: (5)            Contractures      Additional Factors Info  Code Status, Allergies Code Status Info: (DNR) Allergies Info: (PENICILLINS, ALOE, AMLODIPINE, CEPHALEXIN, METOPROLOL, MICONAZOLE, NEOSPORIN NEOMYCIN-BACITRACIN ZN-POLYMYX, TRANDOLAPRIL-VERAPAMIL HCL ER )           Current Medications (07/31/2017):  This is the current hospital active medication list Current Facility-Administered Medications  Medication Dose Route Frequency Provider Last Rate Last Dose  . 0.9 %  sodium chloride infusion   Intravenous Continuous Shaune Pollackhen, Qing, MD 75 mL/hr at 07/31/17 0459    . acetaminophen (TYLENOL) tablet 650 mg  650 mg Oral Q4H PRN Shaune Pollackhen, Qing, MD      . acetaminophen (TYLENOL) tablet 650 mg  650 mg Oral Q6H PRN Shaune Pollackhen, Qing, MD      . ALPRAZolam Prudy Feeler(XANAX) tablet 0.25 mg  0.25 mg Oral TID PRN Adrian SaranMody, Sital, MD   0.25 mg at 07/31/17 1217  . alum & mag hydroxide-simeth (MAALOX/MYLANTA) 200-200-20 MG/5ML suspension 30 mL  30 mL Oral Q6H PRN Shaune Pollackhen, Qing, MD      . aspirin EC tablet 81 mg  81 mg Oral Daily Thana Farreynolds, Leslie, MD   81 mg at 07/31/17 0916  . atenolol (TENORMIN) tablet 100 mg  100 mg Oral Daily Adrian SaranMody, Sital, MD   100 mg at 07/31/17 1227  . clopidogrel (PLAVIX) tablet 75 mg  75 mg Oral Daily Thana Farreynolds, Leslie, MD   75 mg at 07/31/17 0915  . enoxaparin (LOVENOX) injection 40 mg  40 mg Subcutaneous Q24H Shaune Pollackhen, Qing, MD   40 mg at 07/30/17 2114  . hydrALAZINE (APRESOLINE) injection 10 mg  10 mg Intravenous Q6H PRN Shaune Pollackhen, Qing, MD   10 mg at 07/30/17 1635  . hydrALAZINE (APRESOLINE) tablet 25 mg  25 mg Oral  TID Shaune Pollackhen, Qing, MD   25 mg at 07/31/17 0915  . Influenza vac split quadrivalent PF (FLUZONE HIGH-DOSE) injection 0.5 mL  0.5 mL Intramuscular Tomorrow-1000 Shaune Pollackhen, Qing, MD   Stopped at 07/30/17 1050  . lisinopril (PRINIVIL,ZESTRIL) tablet 40 mg  40 mg Oral Daily Shaune Pollackhen, Qing, MD   40 mg at 07/31/17 0915  . metaxalone (SKELAXIN) tablet 800 mg  800 mg Oral Once PRN Katha HammingKonidena, Snehalatha, MD      . ondansetron [facility](ZOFRAN) injection 4 mg  4 mg Intravenous Q6H PRN Shaune Pollackhen, Qing, MD      . pantoprazole (PROTONIX) EC tablet 40 mg  40 mg Oral Daily Shaune Pollackhen, Qing, MD   40 mg at 07/31/17 0914  . polyethylene glycol (MIRALAX / GLYCOLAX) packet 17 g  17 g Oral Daily Adrian SaranMody, Sital, MD   17 g at  07/31/17 1219  . potassium chloride SA (K-DUR,KLOR-CON) CR tablet 40 mEq  40 mEq Oral Once Shaune Pollackhen, Qing, MD      . senna-docusate (Senokot-S) tablet 1 tablet  1 tablet Oral QHS PRN Shaune Pollackhen, Qing, MD         Discharge Medications: Please see discharge summary for a list of discharge medications.  Relevant Imaging Results:  Relevant Lab Results:   Additional Information (SSN: [id number])  Payton SparkAnanda A Verdelle Valtierra, Student-Social Work

## 2017-07-31 NOTE — Clinical Social Work Placement (Signed)
   CLINICAL SOCIAL WORK PLACEMENT  NOTE  Date:  07/31/2017  Patient Details  Name: Debbie Hampton MRN: [id number] Date of Birth: October 18, 1931  Clinical Social Work is seeking post-discharge placement for this patient at the Skilled  Nursing Facility level of care (*CSW will initial, date and re-position this form in  chart as items are completed):  Yes   Patient/family provided with [facility] Work Department's list of facilities offering this level of care within the geographic area requested by the patient (or if unable, by the patient's family).  Yes   Patient/family informed of their freedom to choose among providers that offer the needed level of care, that participate in Medicare, Medicaid or managed care program needed by the patient, have an available bed and are willing to accept the patient.  Yes   Patient/family informed of [facility]'s ownership interest [facility] and [facility], as well as of the fact that they are under no obligation to receive care at these facilities.  PASRR submitted to EDS on 07/31/17     PASRR number received on 07/31/17     Existing PASRR number confirmed on       FL2 transmitted to all facilities in geographic area requested by pt/family on 07/31/17     FL2 transmitted to all facilities within larger geographic area on       Patient informed that his/her managed care company has contracts with or will negotiate with certain facilities, including the following:            Patient/family informed of bed offers received.  Patient chooses bed at       Physician recommends and patient chooses bed at      Patient to be transferred to   on  .  Patient to be transferred to facility by       Patient family notified on   of transfer.  Name of family member notified:        PHYSICIAN       Additional Comment:    _______________________________________________ Kourtni Stineman, Darleen CrockerBailey M, LCSW 07/31/2017, 4:12 PM

## 2017-07-31 NOTE — Progress Notes (Addendum)
Subjective: Patient reports being anxious overnight and feeling that this may have caused elevated BP.  She feels better today.  Continued left sided deficits.  Objective: Current vital signs: BP (!) 157/56   Pulse 90   Temp 97.6 F (36.4 C) (Oral)   Resp 18   Ht 5' (1.524 m)   Wt 53.5 kg (117 lb 14.4 oz)   SpO2 99%   BMI 23.03 kg/m  Vital signs in last 24 hours: Temp:  [97.6 F (36.4 C)-98 F (36.7 C)] 97.6 F (36.4 C) (11/15 0915) Pulse Rate:  [66-90] 90 (11/15 1218) Resp:  [18-19] 18 (11/15 0424) BP: (145-237)/(55-82) 157/56 (11/15 1218) SpO2:  [97 %-100 %] 99 % (11/15 0915)  Intake/Output from previous day: 11/14 0701 - 11/15 0700 In: 2311.3 [P.O.:360; I.V.:1951.3] Out: 400 [Urine:400] Intake/Output this shift: Total I/O In: 620 [P.O.:620] Out: 500 [Urine:500] Nutritional status: Diet 2 gram sodium Room service appropriate? Yes; Fluid consistency: Thin  Neurologic Exam: Mental Status: Alert, oriented, but some issues with memory.  Speech fluent but purposeful.  Able to follow 3 step commands with some reinforcement Cranial Nerves: II: Discs flat bilaterally; Visual fields grossly normal, pupils equal, round, reactive to light and accommodation III,IV, VI: ptosis not present, extra-ocular motions intact bilaterally with decreased upward excursion with the right eye V,VII: smile symmetric, facial light touch sensation normal bilaterally VIII: hearing decreased bilaterally IX,X: gag reflex present XI: bilateral shoulder shrug XII: midline tongue extension Motor: Right :  Upper extremity   5-/5                                     Left:     Upper extremity   5-/5             Lower extremity   5/5                                                  Lower extremity   5/5 Cerebellar: Dysmetria with finger-to-nose and heel-to-shin testing on the left   Lab Results: Basic Metabolic Panel: Recent Labs  Lab 07/29/17 1006 07/30/17 0513  NA 127* 128*  K 3.4* 3.3*  CL  90* 94*  CO2 27 25  GLUCOSE 140* 93  BUN 9 15  CREATININE 0.57 0.72  CALCIUM 9.9 8.8*    Liver Function Tests: No results for input(s): AST, ALT, ALKPHOS, BILITOT, PROT, ALBUMIN in the last 168 hours. No results for input(s): LIPASE, AMYLASE in the last 168 hours. No results for input(s): AMMONIA in the last 168 hours.  CBC: Recent Labs  Lab 07/29/17 1006  WBC 7.0  HGB 12.4  HCT 37.2  MCV 81.7  PLT 251    Cardiac Enzymes: No results for input(s): CKTOTAL, CKMB, CKMBINDEX, TROPONINI in the last 168 hours.  Lipid Panel: Recent Labs  Lab 07/30/17 0513  CHOL 186  TRIG 103  HDL 70  CHOLHDL 2.7  VLDL 21  LDLCALC 95    CBG: No results for input(s): GLUCAP in the last 168 hours.  Microbiology: No results found for this or any previous visit.  Coagulation Studies: No results for input(s): LABPROT, INR in the last 72 hours.  Imaging: Ct Angio Head W Or Wo Contrast  Result Date: 07/30/2017 CLINICAL DATA:  Left  lower extremity weakness. Right corona radiata infarct on MRI. EXAM: CT ANGIOGRAPHY HEAD AND NECK TECHNIQUE: Multidetector CT imaging of the head and neck was performed using the standard protocol during bolus administration of intravenous contrast. Multiplanar CT image reconstructions and MIPs were obtained to evaluate the vascular anatomy. Carotid stenosis measurements (when applicable) are obtained utilizing NASCET criteria, using the distal internal carotid diameter as the denominator. CONTRAST:  75mL ISOVUE-370 IOPAMIDOL (ISOVUE-370) INJECTION 76% COMPARISON:  Head CT and MRI 07/29/2017. Carotid Doppler ultrasound 07/30/2017. Cervical spine CT 03/31/2016. FINDINGS: CT HEAD FINDINGS Brain: Focal hypodensity in the posterior right corona radiata corresponds to the acute infarct on MRI. There is no evidence of acute intracranial hemorrhage, mass, midline shift, or extra-axial fluid collection. The ventricles and sulci are normal for age. Chronic right basal ganglia  region infarcts are noted. Patchy hypodensities throughout the cerebral white matter bilaterally are compatible with moderate chronic small vessel ischemic disease. Vascular: Calcified atherosclerosis at the skullbase. Skull: No fracture or focal osseous lesion. Sinuses: Evidence of chronic left maxillary sinusitis with small volume complex polypoid soft tissue present. Clear mastoid air cells. Orbits: Bilateral cataract extraction. Review of the MIP images confirms the above findings CTA NECK FINDINGS Aortic arch: Standard 3 vessel aortic arch with mild atherosclerotic plaque. Widely patent arch vessel origins. Calcified plaque in the proximal right subclavian artery results in mild stenosis. Right carotid system: Tortuous proximal common carotid artery. Predominantly noncalcified plaque in the proximal ICA results in 35% stenosis. Left carotid system: Widely patent common carotid artery. Mixed calcified and soft plaque in the proximal ICA results in 20% stenosis. Tortuous distal cervical ICA. Vertebral arteries: Patent with the left being strongly dominant. Moderate to severe right vertebral artery origin stenosis. No significant left vertebral stenosis. Skeleton: Moderately advanced cervical disc and facet degeneration. Other neck: 1.9 cm low-density left thyroid nodule, unchanged from the prior cervical spine CT. Upper chest: Clear lung apices. Review of the MIP images confirms the above findings CTA HEAD FINDINGS Anterior circulation: The internal carotid arteries are patent from skullbase to carotid termini with mild siphon atherosclerosis bilaterally. There is moderate right and mild left supraclinoid ICA stenosis. There is a 2 x 3 mm right supraclinoid ICA aneurysm in the posterior communicating region. ACAs and MCAs are patent with mild branch vessel irregularity but no evidence of proximal branch occlusion or significant proximal stenosis. Posterior circulation: The distal right vertebral artery is small  and terminates in PICA. Focal atherosclerotic plaque in the proximal left V4 segment results in mild stenosis. The PICAs and SCAs are grossly patent bilaterally. The basilar artery is patent and tortuous with atherosclerotic irregularity resulting mild mid basilar stenosis. The PCAs are patent with a fetal origin noted on the left. There is mild right P1 stenosis an up to mild left posterior communicating artery narrowing. Mild-to-moderate PCA irregularity is present more distally bilaterally. No aneurysm. Venous sinuses: Patent. Anatomic variants: Fetal origin of the left PCA. Delayed phase: No abnormal enhancement. Review of the MIP images confirms the above findings IMPRESSION: 1. Cervical carotid artery atherosclerosis resulting in less than 50% proximal ICA stenosis bilaterally. 2. Moderate to severe stenosis of the non-dominant proximal right vertebral artery. 3. Intracranial atherosclerosis without major branch occlusion. Moderate right and mild left stepped supraclinoid ICA stenosis. 4. Mild stenoses of the distal left vertebral artery, basilar artery, and proximal PCAs. 5. 3 mm right supraclinoid ICA aneurysm. Electronically Signed   By: Sebastian Ache M.D.   On: 07/30/2017 14:38   Ct  Angio Neck W Or Wo Contrast  Result Date: 07/30/2017 CLINICAL DATA:  Left lower extremity weakness. Right corona radiata infarct on MRI. EXAM: CT ANGIOGRAPHY HEAD AND NECK TECHNIQUE: Multidetector CT imaging of the head and neck was performed using the standard protocol during bolus administration of intravenous contrast. Multiplanar CT image reconstructions and MIPs were obtained to evaluate the vascular anatomy. Carotid stenosis measurements (when applicable) are obtained utilizing NASCET criteria, using the distal internal carotid diameter as the denominator. CONTRAST:  75mL ISOVUE-370 IOPAMIDOL (ISOVUE-370) INJECTION 76% COMPARISON:  Head CT and MRI 07/29/2017. Carotid Doppler ultrasound 07/30/2017. Cervical spine CT  03/31/2016. FINDINGS: CT HEAD FINDINGS Brain: Focal hypodensity in the posterior right corona radiata corresponds to the acute infarct on MRI. There is no evidence of acute intracranial hemorrhage, mass, midline shift, or extra-axial fluid collection. The ventricles and sulci are normal for age. Chronic right basal ganglia region infarcts are noted. Patchy hypodensities throughout the cerebral white matter bilaterally are compatible with moderate chronic small vessel ischemic disease. Vascular: Calcified atherosclerosis at the skullbase. Skull: No fracture or focal osseous lesion. Sinuses: Evidence of chronic left maxillary sinusitis with small volume complex polypoid soft tissue present. Clear mastoid air cells. Orbits: Bilateral cataract extraction. Review of the MIP images confirms the above findings CTA NECK FINDINGS Aortic arch: Standard 3 vessel aortic arch with mild atherosclerotic plaque. Widely patent arch vessel origins. Calcified plaque in the proximal right subclavian artery results in mild stenosis. Right carotid system: Tortuous proximal common carotid artery. Predominantly noncalcified plaque in the proximal ICA results in 35% stenosis. Left carotid system: Widely patent common carotid artery. Mixed calcified and soft plaque in the proximal ICA results in 20% stenosis. Tortuous distal cervical ICA. Vertebral arteries: Patent with the left being strongly dominant. Moderate to severe right vertebral artery origin stenosis. No significant left vertebral stenosis. Skeleton: Moderately advanced cervical disc and facet degeneration. Other neck: 1.9 cm low-density left thyroid nodule, unchanged from the prior cervical spine CT. Upper chest: Clear lung apices. Review of the MIP images confirms the above findings CTA HEAD FINDINGS Anterior circulation: The internal carotid arteries are patent from skullbase to carotid termini with mild siphon atherosclerosis bilaterally. There is moderate right and mild left  supraclinoid ICA stenosis. There is a 2 x 3 mm right supraclinoid ICA aneurysm in the posterior communicating region. ACAs and MCAs are patent with mild branch vessel irregularity but no evidence of proximal branch occlusion or significant proximal stenosis. Posterior circulation: The distal right vertebral artery is small and terminates in PICA. Focal atherosclerotic plaque in the proximal left V4 segment results in mild stenosis. The PICAs and SCAs are grossly patent bilaterally. The basilar artery is patent and tortuous with atherosclerotic irregularity resulting mild mid basilar stenosis. The PCAs are patent with a fetal origin noted on the left. There is mild right P1 stenosis an up to mild left posterior communicating artery narrowing. Mild-to-moderate PCA irregularity is present more distally bilaterally. No aneurysm. Venous sinuses: Patent. Anatomic variants: Fetal origin of the left PCA. Delayed phase: No abnormal enhancement. Review of the MIP images confirms the above findings IMPRESSION: 1. Cervical carotid artery atherosclerosis resulting in less than 50% proximal ICA stenosis bilaterally. 2. Moderate to severe stenosis of the non-dominant proximal right vertebral artery. 3. Intracranial atherosclerosis without major branch occlusion. Moderate right and mild left stepped supraclinoid ICA stenosis. 4. Mild stenoses of the distal left vertebral artery, basilar artery, and proximal PCAs. 5. 3 mm right supraclinoid ICA aneurysm. Electronically Signed  By: Sebastian Ache M.D.   On: 07/30/2017 14:38   Mr Brain Wo Contrast  Result Date: 07/29/2017 CLINICAL DATA:  Multiple falls over the last several days. Headache. Lower extremity weakness. EXAM: MRI HEAD WITHOUT CONTRAST TECHNIQUE: Multiplanar, multiecho pulse sequences of the brain and surrounding structures were obtained without intravenous contrast. COMPARISON:  Head CT same day FINDINGS: Brain: Diffusion imaging shows a 1.5 cm acute infarction in the  right periventricular deep white matter. No other acute infarction. Elsewhere, there chronic small-vessel ischemic changes of the pons. No focal cerebellar insult. Cerebral hemispheres show chronic small-vessel ischemic changes throughout the thalami, basal ganglia and hemispheric white matter. No large vessel territory infarction. No mass lesion, hemorrhage or hydrocephalus. No extra-axial collection. Vascular: Major vessels at the base of the brain show flow. Skull and upper cervical spine: Negative other than degenerative changes at the C1-2 articulation. Sinuses/Orbits: Mucosal inflammatory changes of the left maxillary sinus. Orbits negative. 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 No hemorrhage or swelling. Chronic small-vessel ischemic changes elsewhere throughout the brain as outlined above. Electronically Signed   By: Paulina Fusi M.D.   On: 07/29/2017 15:12   US Carotid Bilateral (at [facility] And [facility] Only)  Result Date: 07/30/2017 CLINICAL DATA:  Stroke.  History of hypertension. EXAM: BILATERAL CAROTID DUPLEX ULTRASOUND TECHNIQUE: Wallace Cullens scale imaging, color Doppler and duplex ultrasound were performed of bilateral carotid and vertebral arteries in the neck. COMPARISON:  Brain MRI - 07/29/2017 FINDINGS: Criteria: Quantification of carotid stenosis is based on velocity parameters that correlate the residual internal carotid diameter with NASCET-based stenosis levels, using the diameter of the distal internal carotid lumen as the denominator for stenosis measurement. The following velocity measurements were obtained: RIGHT ICA:  2 or 9/49 cm/sec CCA:  100/8 cm/sec SYSTOLIC ICA/CCA RATIO:  21 DIASTOLIC ICA/CCA RATIO:  62 ECA:  129 cm/sec LEFT ICA:  131/27 cm/sec CCA:  137/9 cm/sec SYSTOLIC ICA/CCA RATIO:  1.0 DIASTOLIC ICA/CCA RATIO:  3.0 ECA:  111 cm/sec RIGHT CAROTID ARTERY:  There is a minimal amount of atherosclerotic plaque involving the proximal aspect the right common carotid artery (image 5). The right common carotid artery is noted to be tortuous (image 7). There is a minimal amount of atherosclerotic plaque involving the distal aspect the right common carotid artery (image 16). There is a moderate to large amount of echogenic partially shadowing plaque within the right carotid bulb (image 21), extending to involve the origin and proximal aspects of the right internal carotid artery origin and proximal aspects of the right internal carotid artery (image 29), resulting in elevated peak systolic velocities throughout the right internal carotid artery. Greatest acquired peak systolic velocity within the proximal right internal carotid artery measures 209 cm/sec - image 32. RIGHT VERTEBRAL ARTERY:  Antegrade flow LEFT CAROTID ARTERY: There is a moderate amount of atherosclerotic plaque within the left carotid bulb (image 59), extending to involve the origin and proximal aspects of the left internal carotid artery (image 67), resulting in elevated peak systolic velocities within the mid aspect the left internal carotid artery. Greatest acquired peak systolic velocity within the left mid ICA measures 131 cm/sec - image 72. LEFT VERTEBRAL ARTERY:  Antegrade flow There is a potential minimal amount of nonocclusive wall thickening involving the proximal aspect the left internal jugular vein. IMPRESSION: Moderate to large amount of bilateral atherosclerotic plaque results in elevated peak systolic  velocities within bilateral internal carotid arteries compatible with the 50-69% luminal narrowing range bilaterally, right greater than left. Further evaluation with CTA could performed as clinically indicated. Electronically Signed   By: Simonne Come M.D.   On: 07/30/2017 11:41    Medications:  I have reviewed the patient's current medications. Scheduled: . aspirin EC  81 mg Oral Daily  .  atenolol  100 mg Oral Daily  . clopidogrel  75 mg Oral Daily  . enoxaparin (LOVENOX) injection  40 mg Subcutaneous Q24H  . hydrALAZINE  25 mg Oral TID  . Influenza vac split quadrivalent PF  0.5 mL Intramuscular Tomorrow-1000  . lisinopril  40 mg Oral Daily  . pantoprazole  40 mg Oral Daily  . polyethylene glycol  17 g Oral Daily  . potassium chloride  40 mEq Oral Once    Assessment/Plan: No new neurological complaints.  BP improved.  CTA of the head and neck shows no hemodynamically significant carotid stenosis with moderate to severe right vertebral artery stenosis.  3mm right supraclinoid ICA aneurysm noted as well.  Patient on ASA and Plavix.    Recommendations: 1.  Statin for lipid management with target LDL<70. 2.  Continue therapy 3.  Follow up of aneurysm on an outpatient basis wit repeat imaging in 6-12 months.     LOS: 2 days   Thana Farr, MD Neurology [phone] 07/31/2017  1:17 PM

## 2017-07-31 NOTE — PMR Pre-admission (Signed)
[facility] PMR Admission Coordinator Pre-Admission Assessment  Patient: Debbie Hampton is an 83 y.o., female MRN: [id number] DOB: 12-11-1931 Height: 5' (152.4 cm) Weight: 53.5 kg (117 lb 14.4 oz)  Insurance Information HMO:    PPO: yes     PCP:      IPA:      80/20:      OTHER: medicare advantage plan PRIMARY: [employer] Medicare     Policy#: [id number]      Subscriber: pt CM Name: Gweneth Dimitri      Phone#: [phone]     Fax#: [phone] Pre-Cert#: [id number]     Approved for 7 days with f/u Rebeca Alert phone [phone] fax [phone] Employer: retired Benefits:  Phone #: [phone]     Name:  Eff. Date: 09/16/16     Deduct: none      Out of Pocket Max: $4500      Life Max: none CIR: $345 co pay per day days 1-5      SNF:  No co pay days 1-20; $160 co pay per day days 21-49; no co pay days 50-100 Outpatient: $40 co pay per visit     Co-Pay:  Visits per medical neccesity Home Health: 100%      Co-Pay: visits per medical neccesity DME: 80%     Co-Pay: 20% Providers: in network  SECONDARY: none       Medicaid Application Date:       Case Manager:  Disability Application Date:       Case Worker:   Emergency Conservator, museum/gallery Information    Name Relation Home Work Mobile   [place] Spouse [phone]     No name specified          Current Medical History  Patient Admitting Diagnosis:  Right CVA  History of Present Illness:   Debbie Hampton is an 83 years old female with history of HTN, GERD who was admitted on 07/29/17 with reports of multiple falls for 2-3 days PTA, left sided weakness and hypertensive emergency-. MRI brain done revealing 1.5 cm infarct in deep right periventricular white matter. 2D echo showed EF 60-65% with mild MVR, mild to moderate left atrial dilatation, and moderate pulmonary HTN. Carotid dopplers revealed moderate to large amount of atherosclerotic plaque with R > L 50-69% ICA stenosis. 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  Neurology recommended continuing ASA/Plavix for secondary stroke prevention and repeat follow up imaging to monitor aneurysm in 6- 12 months.   Therapy ongoing and patient with limitations due to left sided weakness with balance deficits, LLE ataxia, tachycardia with activity and poor attention affecting ability to carry out ADLs and mobility.   [facility] has been reviewed by the rehabilitation admission coordinator and physician.  NIH Stroke scale: 0 Glascow Coma Scale:  Past Medical History  Past Medical History:  Diagnosis Date  . GERD (gastroesophageal reflux disease)   . Hypertension     Family History   family history includes Hypertension in her other.  Prior Rehab/Hospitalizations Has the patient had major surgery during 100 days prior to admission? No   Current Medications See MAR  Patients Current Diet:  Regular consistency diet with thin liquids  Precautions / Restrictions Precautions Precautions: Fall Restrictions Weight Bearing Restrictions: No   Has the patient had 2 or more falls or a fall  with injury in the past year?Yes. 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  Independent  Indoor Mobility: Did the patient need assistance with walking from room to room (with or without device)? Independent  Stairs: Did the patient need assistance with internal or external stairs (with or without device)? Independent  Functional Cognition: Did the patient need help planning regular tasks such as shopping or remembering to take medications? Independent  Home Assistive Devices /  Equipment Home Assistive Devices/Equipment: Eyeglasses Home Equipment: Hand held shower head  Prior Device Use: Indicate devices/aids used by the patient prior to current illness, exacerbation or injury? None of the above   Prior Functional Level Current Functional Level  Bed Mobility  Independent  Min assist   Transfers  Independent  Mod assist   Mobility - Walk/Wheelchair  Independent  Mod assist; limited spontaneous righting reactions. Mod assist 25 feet. LLE with very inconsistent, ataxic foot placement, poor stability and control. Very high risk to fall with out hands on assist.. On 11/15 with HR elevation to 154 during gait efforts with pt asymptomatic. Required seated rest break for recovery to HR 90- 100.  requiring min guard tactile cues at times to maximize safety.    Upper Body Dressing  Independent  Min assist; LUE >RUE hemiparesis noted, moderate deficits in coordination and fine motor control with finger to nose and thumb opposition testing. Significant deficits in sitting and standing balance. Chronic RTC injuries L>R and baseline paresthesia digits 1-3 ( history of carpal tunnel). LUE with mild dysmetria, mod coordination deficits.   Lower Body Dressing  Independent  Min assist   Grooming  Independent  Min assist   Eating/Drinking  Independent  Min assist   Toilet Transfer  Independent  Mod assist   Bladder Continence   continent  continent   Bowel Management  continent  continent   Stair Climbing     Other(not attempted)   Communication  intact; exagerated speech at baseline  following all commands with increased time to process and attend(pt hyperverbal and minimally distractalbe. at baseline per s)   Memory  intact  intact . Mild intermittent confusion noted; required increased time for processing, task initiation /sequencing. May be some degree of apraxia.  Cooking/Meal Prep  independent      Housework  independent    Money  Management  independent    Driving   yes      Special needs/care consideration BiPAP/CPAP  N/a CPM  N/a Continuous Drip IV  N/a Dialysis  N/a Life Vest  N/a Oxygen  N/a Special Bed  N/a Trach Size  N/a Wound Vac n/a Skin ecchymosis to right shoulder arm and knee, BUE ecchymosis Bowel mgmt: continent Bladder mgmt: continent Diabetic mgmt n/a  Previous Home Environment Living Arrangements: Spouse/significant other  Lives With: Spouse Available Help at Discharge: Available 24 hours/day(cousin and hired caregivers to provide 24/7 supervision) Type of Home: ([facility] independdent [facility] [place] Home Layout: One level Home Access: Stairs to enter Entrance Stairs-Rails: Right, Left Entrance Stairs-Number of Steps: 4 Bathroom Shower/Tub: Psychologist, counsellingWalk-in shower, Door Foot LockerBathroom Toilet: Handicapped height Bathroom Accessibility: Yes How Accessible: Accessible via walker Home Care Services: No  Discharge Living Setting Plans for Discharge Living Setting: Patient's home(sp[facility])  Type of Home at Discharge: House Discharge Home Layout: One level Discharge Home Access: Stairs to enter Entrance Stairs-Rails: Right, Left, Can reach both Entrance Stairs-Number of Steps: 4 Discharge Bathroom Shower/Tub: Walk-in shower Discharge Bathroom Toilet: Handicapped height Discharge Bathroom Accessibility: Yes How Accessible: Accessible via walker Does the patient have any problems obtaining your medications?: No  Social/Family/Support Systems Patient Roles: Spouse, Parent, Caregiver(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 Cousin and hired caregivers will provide 24/7 supervision for pt in her home. I discussed with Allie DimmerWesley Nunziata on 07/31/2017 by in home that insurance will not cover both inpt rehab admission as well as SNF rehab. Home caregivers will be an out of pocket expense.   Goals/Additional Needs Patient/Family Goal for Rehab: supervision with PT and OT Expected length of stay: ELOS 10- 14 days Pt/Family Agrees to Admission and willing to participate: Yes Program Orientation Provided & Reviewed with Pt/Caregiver Including Roles  & Responsibilities: Yes  Patient Condition: I have reviewed all [facility], spoke with patient and her son, Gerri SporeWesley, by phone. patient will benefit from ongoing PT, OT, and SLP which will be provided three hours per day in an Inpatient Acute Rehabilitative Admission. She will also received the coordinated Team approach for therapies, Rehabilitation MD as well as Nursing care. Pt is overall min to mod assist with mobility and all adls. We will admit to Acute Inpatient Rehabilitation today.  Preadmission Screen Completed By:  Clois DupesBoyette, Barbara Godwin, 07/31/2017 3:19 PM ______________________________________________________________________   Discussed status with Dr. Riley KillSwartz on  08/01/2017  at  [phone] and received telephone approval for admission today.  Admission Coordinator:  Clois DupesBoyette, Barbara Godwin, time  [id number] Date 08/01/2017   Assessment/Plan: Diagnosis: Right periventricular white matter infarct left hemiparesis 1. Does the need for close, 24 hr/day  Medical supervision in concert with the patient's rehab needs make it unreasonable for this patient to be served in a less intensive setting? Yes 2. Co-Morbidities requiring supervision/potential complications: Hypertension and post stroke sequela 3. Due to  bladder management, bowel management, safety, skin/wound care, disease management, medication administration, pain management and patient education, does the patient require 24 hr/day rehab nursing? Yes 4. Does the patient require coordinated care of a physician, rehab nurse, PT (1-2 hrs/day, 5 days/week) and OT (1-2 hrs/day, 5 days/week) to address physical and functional deficits in the context of the above medical diagnosis(es)? Yes Addressing deficits in the following areas: balance, endurance, locomotion, strength, transferring, bowel/bladder control, bathing, dressing, feeding, grooming, toileting and psychosocial support 5. Can the patient actively participate in an intensive therapy program of at least 3 hrs of therapy 5 days a week? Yes 6. The potential for patient to make measurable gains while on inpatient rehab is excellent 7. Anticipated functional outcomes upon discharge from inpatients are: supervision PT, supervision OT, n/a SLP 8. Estimated rehab length of stay to reach the above functional goals is: 10-14 days 9. Does the patient have adequate social supports to accommodate these discharge functional goals? Yes 10. Anticipated D/C setting: Home 11. Anticipated post D/C treatments: HH therapy 12. Overall Rehab/Functional Prognosis: excellent  RECOMMENDATIONS: This patient's condition is appropriate for continued rehabilitative care in the following setting: CIR Patient has agreed to participate in recommended program. Yes Note that insurance prior authorization may be required for reimbursement for recommended care.  Comment: Admit to inpatient rehab today  Ranelle Oyster, MD, [facility] Physical Medicine & Rehabilitation 08/01/2017   Clois Dupes 07/31/2017

## 2017-07-31 NOTE — Progress Notes (Signed)
Rehab Admissions Coordinator Note:  Patient was screened by Clois DupesBoyette, Johnnae Impastato Godwin for appropriateness for an Inpatient Acute Rehab Admission per PT and OT recommendations. I spoke with pt and her son, Gerri SporeWesley , by phone with pt's permission. Pt and son prefer an inpt rehab admission rather than SNF if insurance will approve. They plan for placement of pt's spouse in a memory care unit and for pt to receive rehab [facility] [place]. They will arrange 24/7 assist for pt in her home at d/c. I will begin insurance authorization with [employer] for a possible inpt rehab admission tomorrow pending insurance approval. I have updated RN CM, Steward DroneBrenda. I will follow up in the morning. [phone].  Clois DupesBoyette, Antonio Woodhams Godwin 07/31/2017, 2:46 PM  I can be reached at [phone].

## 2017-07-31 NOTE — Care Management Important Message (Signed)
Important Message  Patient Details  Name: Debbie BalesCarol Hampton MRN: [id number] Date of Birth: 1932-07-07   Medicare Important Message Given:  Yes    Gwenette GreetBrenda S Jerlean Peralta, RN 07/31/2017, 7:57 AM

## 2017-07-31 NOTE — Progress Notes (Addendum)
[facility] [place] [facility]   PATIENT NAME: Debbie Hampton    MR#:  [id number]  DATE OF BIRTH:  September 21, 1931  SUBJECTIVE:   Patient worked with PT and recommendations are for rehabilitation upon discharge.  REVIEW OF SYSTEMS:    Review of Systems  Constitutional: Negative for fever, chills weight loss HENT: Negative for ear pain, nosebleeds, congestion, facial swelling, rhinorrhea, neck pain, neck stiffness and ear discharge.   Respiratory: Negative for cough, shortness of breath, wheezing  Cardiovascular: Negative for chest pain, palpitations and leg swelling.  Gastrointestinal: Negative for heartburn, abdominal pain, vomiting, diarrhea or consitpation Genitourinary: Negative for dysuria, urgency, frequency, hematuria Musculoskeletal: Negative for back pain or joint pain Neurological: Negative for dizziness, seizures, syncope,  numbness and headaches. Positive left lower extremity weakness/chronic shoulder pain Hematological: Does not bruise/bleed easily.  Psychiatric/Behavioral: Negative for hallucinations, confusion, dysphoric mood    Tolerating Diet: yes      DRUG ALLERGIES:   Allergies  Allergen Reactions  . Penicillins     Has patient had a PCN reaction causing immediate rash, facial/tongue/throat swelling, SOB or lightheadedness with hypotension: Yes Has patient had a PCN reaction causing severe rash involving mucus membranes or skin necrosis: No Has patient had a PCN reaction that required hospitalization No Has patient had a PCN reaction occurring within the last 10 years: Yes If all of the above answers are "NO", then may proceed with Cephalosporin use.   . Aloe Hives and Rash  . Amlodipine Hives, Rash and Hypertension  . Cephalexin Rash  . Metoprolol Hives, Palpitations and Hypertension  . Miconazole Swelling and Rash  . Neosporin [Neomycin-Bacitracin Zn-Polymyx] Rash  . Trandolapril-Verapamil Hcl Er Rash    VITALS:  Blood pressure  (!) 170/57, pulse 85, temperature 97.6 F (36.4 C), temperature source Oral, resp. rate 18, height 5' (1.524 m), weight 53.5 kg (117 lb 14.4 oz), SpO2 99 %.  PHYSICAL EXAMINATION:  Constitutional: Appears well-developed and well-nourished. No distress. HENT: Normocephalic. Marland Kitchen. Oropharynx is clear and moist.  Eyes: Conjunctivae and EOM are normal. PERRLA, no scleral icterus.  Neck: Normal ROM. Neck supple. No JVD. No tracheal deviation. CVS: RRR, S1/S2 +, no murmurs, no gallops, no carotid bruit.  Pulmonary: Effort and breath sounds normal, no stridor, rhonchi, wheezes, rales.  Abdominal: Soft. BS +,  no distension, tenderness, rebound or guarding.  Musculoskeletal: Decreased range of motion shoulder due to tendinitis  Left lower extremity improved  Neuro: Alert. CN 2-12 grossly intact. No focal deficits. Skin: Skin is warm and dry. No rash noted. Psychiatric: Normal mood and affect.      LABORATORY PANEL:   CBC Recent Labs  Lab 07/29/17 1006  WBC 7.0  HGB 12.4  HCT 37.2  PLT 251   ------------------------------------------------------------------------------------------------------------------  Chemistries  Recent Labs  Lab 07/30/17 0513  NA 128*  K 3.3*  CL 94*  CO2 25  GLUCOSE 93  BUN 15  CREATININE 0.72  CALCIUM 8.8*   ------------------------------------------------------------------------------------------------------------------  Cardiac Enzymes No results for input(s): TROPONINI in the last 168 hours. ------------------------------------------------------------------------------------------------------------------  RADIOLOGY:  Ct Angio Head W Or Wo Contrast  Result Date: 07/30/2017 CLINICAL DATA:  Left lower extremity weakness. Right corona radiata infarct on MRI. EXAM: CT ANGIOGRAPHY HEAD AND NECK TECHNIQUE: Multidetector CT imaging of the head and neck was performed using the standard protocol during bolus administration of intravenous contrast.  Multiplanar CT image reconstructions and MIPs were obtained to evaluate the vascular anatomy. Carotid stenosis measurements (when applicable) are obtained utilizing  NASCET criteria, using the distal internal carotid diameter as the denominator. CONTRAST:  75mL ISOVUE-370 IOPAMIDOL (ISOVUE-370) INJECTION 76% COMPARISON:  Head CT and MRI 07/29/2017. Carotid Doppler ultrasound 07/30/2017. Cervical spine CT 03/31/2016. FINDINGS: CT HEAD FINDINGS Brain: Focal hypodensity in the posterior right corona radiata corresponds to the acute infarct on MRI. There is no evidence of acute intracranial hemorrhage, mass, midline shift, or extra-axial fluid collection. The ventricles and sulci are normal for age. Chronic right basal ganglia region infarcts are noted. Patchy hypodensities throughout the cerebral white matter bilaterally are compatible with moderate chronic small vessel ischemic disease. Vascular: Calcified atherosclerosis at the skullbase. Skull: No fracture or focal osseous lesion. Sinuses: Evidence of chronic left maxillary sinusitis with small volume complex polypoid soft tissue present. Clear mastoid air cells. Orbits: Bilateral cataract extraction. Review of the MIP images confirms the above findings CTA NECK FINDINGS Aortic arch: Standard 3 vessel aortic arch with mild atherosclerotic plaque. Widely patent arch vessel origins. Calcified plaque in the proximal right subclavian artery results in mild stenosis. Right carotid system: Tortuous proximal common carotid artery. Predominantly noncalcified plaque in the proximal ICA results in 35% stenosis. Left carotid system: Widely patent common carotid artery. Mixed calcified and soft plaque in the proximal ICA results in 20% stenosis. Tortuous distal cervical ICA. Vertebral arteries: Patent with the left being strongly dominant. Moderate to severe right vertebral artery origin stenosis. No significant left vertebral stenosis. Skeleton: Moderately advanced cervical  disc and facet degeneration. Other neck: 1.9 cm low-density left thyroid nodule, unchanged from the prior cervical spine CT. Upper chest: Clear lung apices. Review of the MIP images confirms the above findings CTA HEAD FINDINGS Anterior circulation: The internal carotid arteries are patent from skullbase to carotid termini with mild siphon atherosclerosis bilaterally. There is moderate right and mild left supraclinoid ICA stenosis. There is a 2 x 3 mm right supraclinoid ICA aneurysm in the posterior communicating region. ACAs and MCAs are patent with mild branch vessel irregularity but no evidence of proximal branch occlusion or significant proximal stenosis. Posterior circulation: The distal right vertebral artery is small and terminates in PICA. Focal atherosclerotic plaque in the proximal left V4 segment results in mild stenosis. The PICAs and SCAs are grossly patent bilaterally. The basilar artery is patent and tortuous with atherosclerotic irregularity resulting mild mid basilar stenosis. The PCAs are patent with a fetal origin noted on the left. There is mild right P1 stenosis an up to mild left posterior communicating artery narrowing. Mild-to-moderate PCA irregularity is present more distally bilaterally. No aneurysm. Venous sinuses: Patent. Anatomic variants: Fetal origin of the left PCA. Delayed phase: No abnormal enhancement. Review of the MIP images confirms the above findings IMPRESSION: 1. Cervical carotid artery atherosclerosis resulting in less than 50% proximal ICA stenosis bilaterally. 2. Moderate to severe stenosis of the non-dominant proximal right vertebral artery. 3. Intracranial atherosclerosis without major branch occlusion. Moderate right and mild left stepped supraclinoid ICA stenosis. 4. Mild stenoses of the distal left vertebral artery, basilar artery, and proximal PCAs. 5. 3 mm right supraclinoid ICA aneurysm. Electronically Signed   By: Sebastian Ache M.D.   On: 07/30/2017 14:38   Ct  Angio Neck W Or Wo Contrast  Result Date: 07/30/2017 CLINICAL DATA:  Left lower extremity weakness. Right corona radiata infarct on MRI. EXAM: CT ANGIOGRAPHY HEAD AND NECK TECHNIQUE: Multidetector CT imaging of the head and neck was performed using the standard protocol during bolus administration of intravenous contrast. Multiplanar CT image reconstructions and MIPs were  obtained to evaluate the vascular anatomy. Carotid stenosis measurements (when applicable) are obtained utilizing NASCET criteria, using the distal internal carotid diameter as the denominator. CONTRAST:  75mL ISOVUE-370 IOPAMIDOL (ISOVUE-370) INJECTION 76% COMPARISON:  Head CT and MRI 07/29/2017. Carotid Doppler ultrasound 07/30/2017. Cervical spine CT 03/31/2016. FINDINGS: CT HEAD FINDINGS Brain: Focal hypodensity in the posterior right corona radiata corresponds to the acute infarct on MRI. There is no evidence of acute intracranial hemorrhage, mass, midline shift, or extra-axial fluid collection. The ventricles and sulci are normal for age. Chronic right basal ganglia region infarcts are noted. Patchy hypodensities throughout the cerebral white matter bilaterally are compatible with moderate chronic small vessel ischemic disease. Vascular: Calcified atherosclerosis at the skullbase. Skull: No fracture or focal osseous lesion. Sinuses: Evidence of chronic left maxillary sinusitis with small volume complex polypoid soft tissue present. Clear mastoid air cells. Orbits: Bilateral cataract extraction. Review of the MIP images confirms the above findings CTA NECK FINDINGS Aortic arch: Standard 3 vessel aortic arch with mild atherosclerotic plaque. Widely patent arch vessel origins. Calcified plaque in the proximal right subclavian artery results in mild stenosis. Right carotid system: Tortuous proximal common carotid artery. Predominantly noncalcified plaque in the proximal ICA results in 35% stenosis. Left carotid system: Widely patent common  carotid artery. Mixed calcified and soft plaque in the proximal ICA results in 20% stenosis. Tortuous distal cervical ICA. Vertebral arteries: Patent with the left being strongly dominant. Moderate to severe right vertebral artery origin stenosis. No significant left vertebral stenosis. Skeleton: Moderately advanced cervical disc and facet degeneration. Other neck: 1.9 cm low-density left thyroid nodule, unchanged from the prior cervical spine CT. Upper chest: Clear lung apices. Review of the MIP images confirms the above findings CTA HEAD FINDINGS Anterior circulation: The internal carotid arteries are patent from skullbase to carotid termini with mild siphon atherosclerosis bilaterally. There is moderate right and mild left supraclinoid ICA stenosis. There is a 2 x 3 mm right supraclinoid ICA aneurysm in the posterior communicating region. ACAs and MCAs are patent with mild branch vessel irregularity but no evidence of proximal branch occlusion or significant proximal stenosis. Posterior circulation: The distal right vertebral artery is small and terminates in PICA. Focal atherosclerotic plaque in the proximal left V4 segment results in mild stenosis. The PICAs and SCAs are grossly patent bilaterally. The basilar artery is patent and tortuous with atherosclerotic irregularity resulting mild mid basilar stenosis. The PCAs are patent with a fetal origin noted on the left. There is mild right P1 stenosis an up to mild left posterior communicating artery narrowing. Mild-to-moderate PCA irregularity is present more distally bilaterally. No aneurysm. Venous sinuses: Patent. Anatomic variants: Fetal origin of the left PCA. Delayed phase: No abnormal enhancement. Review of the MIP images confirms the above findings IMPRESSION: 1. Cervical carotid artery atherosclerosis resulting in less than 50% proximal ICA stenosis bilaterally. 2. Moderate to severe stenosis of the non-dominant proximal right vertebral artery. 3.  Intracranial atherosclerosis without major branch occlusion. Moderate right and mild left stepped supraclinoid ICA stenosis. 4. Mild stenoses of the distal left vertebral artery, basilar artery, and proximal PCAs. 5. 3 mm right supraclinoid ICA aneurysm. Electronically Signed   By: Sebastian Ache M.D.   On: 07/30/2017 14:38   Mr Brain Wo Contrast  Result Date: 07/29/2017 CLINICAL DATA:  Multiple falls over the last several days. Headache. Lower extremity weakness. EXAM: MRI HEAD WITHOUT CONTRAST TECHNIQUE: Multiplanar, multiecho pulse sequences of the brain and surrounding structures were obtained without intravenous contrast. COMPARISON:  Head CT same day FINDINGS: Brain: Diffusion imaging shows a 1.5 cm acute infarction in the right periventricular deep white matter. No other acute infarction. Elsewhere, there chronic small-vessel ischemic changes of the pons. No focal cerebellar insult. Cerebral hemispheres show chronic small-vessel ischemic changes throughout the thalami, basal ganglia and hemispheric white matter. No large vessel territory infarction. No mass lesion, hemorrhage or hydrocephalus. No extra-axial collection. Vascular: Major vessels at the base of the brain show flow. Skull and upper cervical spine: Negative other than degenerative changes at the C1-2 articulation. Sinuses/Orbits: Mucosal inflammatory changes of the left maxillary sinus. Orbits negative. 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 No hemorrhage or swelling. Chronic small-vessel ischemic changes elsewhere throughout the brain as outlined above. Electronically Signed   By: Paulina FusiMark  Shogry M.D.   On: 07/29/2017 15:12   [place] Carotid Bilateral (at [facility] And Ap Only)  Result Date: 07/30/2017 CLINICAL DATA:  Stroke.  History of hypertension. EXAM: BILATERAL CAROTID DUPLEX ULTRASOUND TECHNIQUE: Wallace CullensGray scale  imaging, color Doppler and duplex ultrasound were performed of bilateral carotid and vertebral arteries in the neck. COMPARISON:  Brain MRI - 07/29/2017 FINDINGS: Criteria: Quantification of carotid stenosis is based on velocity parameters that correlate the residual internal carotid diameter with NASCET-based stenosis levels, using the diameter of the distal internal carotid lumen as the denominator for stenosis measurement. The following velocity measurements were obtained: RIGHT ICA:  2 or 9/49 cm/sec CCA:  100/8 cm/sec SYSTOLIC ICA/CCA RATIO:  21 DIASTOLIC ICA/CCA RATIO:  62 ECA:  129 cm/sec LEFT ICA:  131/27 cm/sec CCA:  137/9 cm/sec SYSTOLIC ICA/CCA RATIO:  1.0 DIASTOLIC ICA/CCA RATIO:  3.0 ECA:  111 cm/sec RIGHT CAROTID ARTERY: There is a minimal amount of atherosclerotic plaque involving the proximal aspect the right common carotid artery (image 5). The right common carotid artery is noted to be tortuous (image 7). There is a minimal amount of atherosclerotic plaque involving the distal aspect the right common carotid artery (image 16). There is a moderate to large amount of echogenic partially shadowing plaque within the right carotid bulb (image 21), extending to involve the origin and proximal aspects of the right internal carotid artery origin and proximal aspects of the right internal carotid artery (image 29), resulting in elevated peak systolic velocities throughout the right internal carotid artery. Greatest acquired peak systolic velocity within the proximal right internal carotid artery measures 209 cm/sec - image 32. RIGHT VERTEBRAL ARTERY:  Antegrade flow LEFT CAROTID ARTERY: There is a moderate amount of atherosclerotic plaque within the left carotid bulb (image 59), extending to involve the origin and proximal aspects of the left internal carotid artery (image 67), resulting in elevated peak systolic velocities within the mid aspect the left internal carotid artery. Greatest acquired peak  systolic velocity within the left mid ICA measures 131 cm/sec - image 72. LEFT VERTEBRAL ARTERY:  Antegrade flow There is a potential minimal amount of nonocclusive wall thickening involving the proximal aspect the left internal jugular vein. IMPRESSION: Moderate to large amount of bilateral atherosclerotic plaque results in elevated peak systolic velocities within bilateral internal carotid arteries compatible with the 50-69% luminal narrowing range bilaterally, right greater than left. Further evaluation with CTA could performed as clinically indicated. Electronically Signed   By: Simonne ComeJohn  Watts M.D.   On: 07/30/2017 11:41     ASSESSMENT AND PLAN:   83 year old female with essential hypertension who presents  with left lower extremity weakness.  1. Acute 1.5 cm infarction within the periventricular deep white matter on the right adjacent to the posterior body of the lateral ventricle Continue aspirin and Plavix as per recommendation by neurology Continue statin She will need skilled nursing facility upon discharge.  CTA of head and neck showed no hemodynamically significant stenosis however do show  Moderate to severe stenosis of the non-dominant proximal right vertebral artery Echocardiogram shows no evidence of thrombosis or major valvular abnormalities   2. Accelerated Essential hypertension: Blood pressure was elevated yesterday. Continue atenolol, hydralazine, lisinopril Monitor blood pressure Ad when necessary Xanax for anxiety which is also causing some of the elevated blood pressure  3. GERD: Continue PPI  4. Hypokalemia: This was repleted  5. 3 mm right supraclinoid ICA aneurysm.: Outpatient NEUROLOGY follow up  Management plans discussed with the patient and son she is in agreement.  CODE STATUS: dnr  TOTAL TIME TAKING CARE OF THIS PATIENT: .   D/w son  POSSIBLE D/C tomorrow to skilled nursing facility depending on clinical condition.  Debbie Hampton M.D on  07/31/2017 at 12:11 PM  Between 7am to 6pm - Pager - [phone] After 6pm go to [email or web address] - password [email or web address]  [facility]  Office  [phone]  CC: Primary care physician; Kandyce Rud, MD  Note: This dictation was prepared with Dragon dictation along with smaller phrase technology. Any transcriptional errors that result from this process are unintentional.

## 2017-07-31 NOTE — Clinical Social Work Note (Addendum)
Clinical Social Work Assessment  Patient Details  Name: Debbie Hampton MRN: [id number] Date of Birth: 1932-03-28  Date of referral:  07/31/17               Reason for consult:  Facility Placement, Discharge Planning                Permission sought to share information with:  Chartered certified accountant granted to share information::  Yes, Verbal Permission Granted  Name::      [place]::   [place]  Relationship::     Contact Information:     Housing/Transportation Living arrangements for the past 2 months:  [place] of Information:  Adult Children Patient Interpreter Needed:  None Criminal Activity/Legal Involvement Pertinent to Current Situation/Hospitalization:  No - Comment as needed Significant Relationships:  Adult Children Lives with:  Spouse Do you feel safe going back to the place where you live?  Yes Need for family participation in patient care:  Yes (Comment)  Care giving concerns: Patient lives with her husband Harrington Challenger at [facility] independent living in [place].   Social Worker assessment / plan: Holiday representative (CSW) received new SNF consult. PT is recommending CIR. Social work Theatre manager met with patient's son Lake Bells [phone]) at the doorway of patient's room and stepped outside to talk. Social work Theatre manager introduced self and explained the role of [place]. Patient's son stated that patient lives with her husband who suffers with dementia at [facility] independent living and shared that he is her primary contact. Social work Theatre manager explained that PT is recommending inpatient rehab at [facility] and that if placement doesn't work out then SNF would also be an option. Social work Theatre manager explained the SNF process. Patient's son verbalized his understanding. Patient's son prefers that patient complete rehab at inpatient rehab at [facility] but is agreeable to a SNF search in  [facility], somewhere close to [employer]. FL2 completed and faxed out. CSW and social work Theatre manager will continue to follow up and assist.  Employment status:  Retired Nurse, adult PT Recommendations:  Inpatient [place], [place] / Referral to community resources:  [place]  Patient/Family's Response to care: Patient's son prefers patient complete in patient rehab at [employer] but is agreeable to a SNF search in [place].  Patient/Family's Understanding of and Emotional Response to Diagnosis, Current Treatment, and Prognosis: Patient's son was pleasant and thanked social work Theatre manager for her assistance.  Emotional Assessment Appearance:    Attitude/Demeanor/Rapport:  Unable to Assess Affect (typically observed):  Unable to Assess Orientation:  Oriented to Self, Oriented to Place, Oriented to  Time, Oriented to Situation Alcohol / Substance use:  Not Applicable Psych involvement (Current and /or in the community):  No (Comment)  Discharge Needs  Concerns to be addressed:  Care Coordination, Discharge Planning Concerns Readmission within the last 30 days:  No Current discharge risk:  Dependent with Mobility Barriers to Discharge:  Continued Medical Work up   Smith Mince, Student-Social Work 07/31/2017, 1:31 PM

## 2017-07-31 NOTE — Evaluation (Signed)
Physical Therapy Evaluation Patient Details Name: Debbie BalesCarol Hampton MRN: [id number] DOB: Nov 28, 1931 Today's Date: 07/31/2017   History of Present Illness  presented to ER secondary to L UE/LE weakness, 2 falls prior to admission; admitted with acute CVA (1.5cm acute infarct to R periventricular area).  CTA significant for 3mm ICA aneurysm; to follow up outpatient per neurology.  Clinical Impression  Upon evaluation, patient alert and oriented; follows all commands and demonstrates excellent motivation/participation with session.  Mild, intermittent confusion noted; requiring increased time for procession, task initiation/sequencing.  Suspect some degree of apraxia.  L UE > LE hemiparesis noted; moderate deficits in coordination and fine motor control.  Significant deficits in sitting and standing balance, requiring mod assist from therapist for balance correction to midline in all planes.  Very limited spontaneous righting reactions noted.  Demonstrates ability to complete sit/stand, basic transfers and gait (25') with L HHA, mod assist.  L LE with very inconsistent, ataxic foot placement; poor stability and control.  Very high risk for falls without hands-on, skilled assistance at this time. Of note, patient with HR elevation to 154bpm during gait efforts (Asymptomatic), requiring seated rest and 3-4 min rest period for recovery to 90-100bpm.  RN aware. Will continue to monitor response to activity and grade appropriately in subsequent sessions. Sons present throughout and very invested, supportive of mother's recovery. Would benefit from skilled PT to address above deficits and promote optimal return to PLOF; recommend transition to acute inpatient rehab upon discharge for high-intensity, post-acute rehab services.      Follow Up Recommendations CIR    Equipment Recommendations  Rolling walker with 5" wheels;3in1 (PT)    Recommendations for Other Services       Precautions / Restrictions  Precautions Precautions: Fall Restrictions Weight Bearing Restrictions: No      Mobility  Bed Mobility Overal bed mobility: Needs Assistance Bed Mobility: Supine to Sit     Supine to sit: Supervision     General bed mobility comments: heavy use of bilat UEs to complete  Transfers Overall transfer level: Needs assistance   Transfers: Sit to/from Stand Sit to Stand: Mod assist         General transfer comment: cuing for foot placement (tends to maintain anterior to COG), assist for lumbar extension, forward trunk lean; excessive posterior weight shift throughout  Ambulation/Gait Ambulation/Gait assistance: Mod assist Ambulation Distance (Feet): 25 Feet Assistive device: Rolling walker (2 wheeled)       General Gait Details: step to gait pattern with ataxic L LE foot placement, poor L LE hip activation/stability (mod manual cuing to faciltiate); L lateral lean (mild pushing at times).  Poor balance, poor stability.  Stairs            Wheelchair Mobility    Modified Rankin (Stroke Patients Only)       Balance Overall balance assessment: Needs assistance Sitting-balance support: No upper extremity supported;Feet supported Sitting balance-Leahy Scale: Fair Sitting balance - Comments: posterior trunk lean, limited righting reactions   Standing balance support: No upper extremity supported Standing balance-Leahy Scale: Poor                               Pertinent Vitals/Pain Pain Assessment: No/denies pain    Home Living Family/patient expects to be discharged to:: Private residence Living Arrangements: Spouse/significant other Available Help at Discharge: Family Type of Home: House Home Access: Stairs to enter Entrance Stairs-Rails: Doctor, general practiceight;Left Entrance Stairs-Number of Steps: 4  Home Layout: One level Home Equipment: None      Prior Function Level of Independence: Independent         Comments: Indep with ADLs, household and  community mobility without assist device; + driving; caregiver for husband (does all household activities, transport and physical assist as needed)     Hand Dominance   Dominant Hand: Left    Extremity/Trunk Assessment   Upper Extremity Assessment Upper Extremity Assessment: (bilat UEs with chronic RTC injuries (L > R) and baseline paresthesia digits 1-3 (history of carpal tunnel).  L UE with mild dysmetria, mod coordination and fine motor deficits, strength grossly 4-/5 at elbow, wrist and hand)    Lower Extremity Assessment Lower Extremity Assessment: (L LE grossly 4-/5 throughout, mild deficits in coordination and motor control)       Communication   Communication: No difficulties(min cuing for redirection to topic of coversation; hyperverbal )  Cognition Arousal/Alertness: Awake/alert Behavior During Therapy: WFL for tasks assessed/performed Overall Cognitive Status: Within Functional Limits for tasks assessed                                 General Comments: oriented x4; follows simple commands, but requires increased time for processing and task initiation-suspect some degree of ataxia; immediate and delayed recall 3/3      General Comments      Exercises Other Exercises Other Exercises: Unsupported sitting, worked to promote lumbar extension, forward trunk lean and overall mechanics required for safe/efficient sit/stand transfer.  Requiring consistent mod assist from therapist to complete.  Did progress to static standing balance with emphasis on midline orientation in A/P and M/L planes; mod assist throughout for excessive posterior weight shift and poor balance/righting reactions.  Limited attempts noted for self-correction by patient. Other Exercises: 3925' with RW, mod assist-cuing for L LE foot placement (narrowed, near scissoring stepping pattern at times) and stance width; mod assist for R anterior/lateral weight shift throughout.  Of note, patient with HR  elevation to 154 with gait trial, requiring seated rest (cuing for pursed lip breathing) and 3-4 min seated rest for recovery to 90-100bpm.   Assessment/Plan    PT Assessment Patient needs continued PT services  PT Problem List Decreased strength;Decreased range of motion;Decreased activity tolerance;Decreased balance;Decreased mobility;Decreased coordination;Decreased cognition;Decreased knowledge of use of DME;Decreased safety awareness;Decreased knowledge of precautions;Cardiopulmonary status limiting activity       PT Treatment Interventions DME instruction;Gait training;Stair training;Functional mobility training;Therapeutic activities;Therapeutic exercise;Balance training;Neuromuscular re-education;Cognitive remediation;Patient/family education    PT Goals (Current goals can be found in the Care Plan section)  Acute Rehab PT Goals Patient Stated Goal: to be as independent as possible PT Goal Formulation: With patient/family Time For Goal Achievement: 08/14/17 Potential to Achieve Goals: Good    Frequency 7X/week   Barriers to discharge Decreased caregiver support      Co-evaluation               AM-PAC PT "6 Clicks" Daily Activity  Outcome Measure Difficulty turning over in bed (including adjusting bedclothes, sheets and blankets)?: Unable Difficulty moving from lying on back to sitting on the side of the bed? : Unable Difficulty sitting down on and standing up from a chair with arms (e.g., wheelchair, bedside commode, etc,.)?: Unable Help needed moving to and from a bed to chair (including a wheelchair)?: A Lot Help needed walking in hospital room?: A Lot Help needed climbing 3-5 steps with a railing? :  A Lot 6 Click Score: 9    End of Session Equipment Utilized During Treatment: Gait belt Activity Tolerance: Patient tolerated treatment well Patient left: in chair;with call bell/phone within reach;with chair alarm set Nurse Communication: Mobility status PT Visit  Diagnosis: Muscle weakness (generalized) (M62.81);Difficulty in walking, not elsewhere classified (R26.2);Hemiplegia and hemiparesis Hemiplegia - Right/Left: Left Hemiplegia - dominant/non-dominant: Dominant Hemiplegia - caused by: Cerebral infarction    Time: 1191-4782 PT Time Calculation (min) (ACUTE ONLY): 48 min   Charges:   PT Evaluation $PT Eval Moderate Complexity: 1 Mod PT Treatments $Gait Training: 8-22 mins $Neuromuscular Re-education: 8-22 mins   PT G Codes:   PT G-Codes **NOT FOR INPATIENT CLASS** Functional Assessment Tool Used: AM-PAC 6 Clicks Basic Mobility Functional Limitation: Mobility: Walking and moving around Mobility: Walking and Moving Around Current Status (N5621): At least 60 percent but less than 80 percent impaired, limited or restricted Mobility: Walking and Moving Around Goal Status [phone]): At least 1 percent but less than 20 percent impaired, limited or restricted    Etha Stambaugh H. Manson Passey, PT, DPT, NCS 07/31/17, 1:57 PM [phone]

## 2017-07-31 NOTE — Evaluation (Signed)
Occupational Therapy Evaluation Patient Details Name: Debbie BalesCarol Hampton MRN: [id number] DOB: 1932/05/02 Today's Date: 07/31/2017    History of Present Illness presented to ER secondary to L UE/LE weakness, 2 falls prior to admission; admitted with acute CVA (1.5cm acute infarct to R periventricular area).  CTA significant for 3mm ICA aneurysm; to follow up outpatient per neurology.   Clinical Impression   Pt seen for OT Evaluation this date. PTA, pt was independent and primary caregiver for spouse with dementia, providing physical assist for bathing, dressing tasks while also performing all IADL tasks, driving, cooking, cleaning, etc. Pt alert and oriented, very pleasant and motivated to work with therapy. Son present for session and also very supportive of pt. Pt generally followed all commands with occasional increased time to process request/VC to attend as pt hyperverbal and minimally distractible, sharing additional extraneous details regarding PLOF. Mild difficulty at times with word finding (but pt and son reporting pt back to baseline with speech/language). L UE > LE hemiparesis noted; moderate deficits in coordination and fine motor control with finger to nose and thumb opposition testing. No visual deficits noted with testing.  Significant deficits in sitting and standing balance, limited spontaneous righting reactions requiring min guard/tactile cues at times to maximize safety. Son noted difficulty cutting meat at meal due to decreased coordination. Would benefit from skilled OT to address above functional impairments and performance deficits and promote optimal return to PLOF; recommend transition to acute inpatient rehab upon discharge for high-intensity, post-acute rehab services.    Follow Up Recommendations  CIR    Equipment Recommendations  Other (comment)(defer to next venue of care based on pt progression)    Recommendations for Other Services Rehab consult     Precautions /  Restrictions Precautions Precautions: Fall Restrictions Weight Bearing Restrictions: No      Mobility Bed Mobility      General bed mobility comments: deferred, up in recliner  Transfers Overall transfer level: Needs assistance   Transfers: Sit to/from Stand Sit to Stand: Mod assist         General transfer comment: VC for hands/feet placement, posterior weight shift    Balance Overall balance assessment: Needs assistance Sitting-balance support: No upper extremity supported;Feet supported Sitting balance-Leahy Scale: Fair Sitting balance - Comments: posterior trunk lean, limited righting reactions requiring min guard at times    Standing balance support: No upper extremity supported Standing balance-Leahy Scale: Poor                             ADL either performed or assessed with clinical judgement   ADL Overall ADL's : Needs assistance/impaired Eating/Feeding: Sitting;Set up   Grooming: Sitting;Set up   Upper Body Bathing: Sitting;Set up;Minimal assistance   Lower Body Bathing: Sitting/lateral leans;Minimal assistance   Upper Body Dressing : Sitting;Minimal assistance   Lower Body Dressing: Sitting/lateral leans;Min guard   Toilet Transfer: Stand-pivot;BSC;Moderate assistance                   Vision Baseline Vision/History: Wears glasses Wears Glasses: Reading only Patient Visual Report: No change from baseline Vision Assessment?: No apparent visual deficits     Perception     Praxis      Pertinent Vitals/Pain Pain Assessment: No/denies pain     Hand Dominance Left   Extremity/Trunk Assessment Upper Extremity Assessment Upper Extremity Assessment: (bilat UEs with chronic RTC injuries (L > R) and baseline paresthesia digits 1-3 (history of carpal tunnel).  L UE with mild dysmetria, mod coordination and fine motor deficits, strength grossly 4-/5 at elbow, wrist and hand)   Lower Extremity Assessment Lower Extremity  Assessment: (L LE grossly 4-/5 throughout, mild deficits in coordination and motor control)       Communication Communication Communication: No difficulties(hyperverbal, min VC to redirect)   Cognition Arousal/Alertness: Awake/alert Behavior During Therapy: WFL for tasks assessed/performed Overall Cognitive Status: Within Functional Limits for tasks assessed                                    General Comments       Exercises   Shoulder Instructions      Home Living Family/patient expects to be discharged to:: Private residence Living Arrangements: Spouse/significant other Available Help at Discharge: Family Type of Home: House Home Access: Stairs to enter Secretary/administratorntrance Stairs-Number of Steps: 4 Entrance Stairs-Rails: Right;Left Home Layout: One level     Bathroom Shower/Tub: Walk-in shower;Door   Bathroom Toilet: Handicapped height     Home Equipment: Hand held shower head          Prior Functioning/Environment Level of Independence: Independent        Comments: Indep with ADLs, household and community mobility without assist device; + driving; caregiver 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 No falls other than 2 just prior to admission in past 12 months.        OT Problem List: Decreased strength;Decreased knowledge of use of DME or AE;Decreased range of motion;Decreased coordination;Decreased activity tolerance;Impaired UE functional use;Decreased safety awareness;Impaired balance (sitting and/or standing)      OT Treatment/Interventions: Self-care/ADL training;Therapeutic exercise;Therapeutic activities;Neuromuscular education;Patient/family education;DME and/or AE instruction;Balance training    OT Goals(Current goals can be found in the care plan section) Acute Rehab OT Goals Patient Stated Goal: to be as independent as possible OT Goal Formulation: With patient/family Time For Goal  Achievement: 08/14/17 Potential to Achieve Goals: Good ADL Goals Pt Will Perform Grooming: with modified independence;sitting(using dominant L hand to complete teeth brushing.) Pt Will Perform Lower Body Dressing: with supervision;sit to/from stand(AE as needed, no LOB during transitional movements) Additional ADL Goal #1: Pt will perform functional ADL tasks requiring fine motor coordination without dropping objects using L hand and utilizing compensatory strategies to support independence.  OT Frequency: Min 3X/week   Barriers to D/C:            Co-evaluation              AM-PAC PT "6 Clicks" Daily Activity     Outcome Measure Help from another person eating meals?: A Little Help from another person taking care of personal grooming?: A Little Help from another person toileting, which includes using toliet, bedpan, or urinal?: A Lot Help from another person bathing (including washing, rinsing, drying)?: A Lot Help from another person to put on and taking off regular upper body clothing?: A Little Help from another person to put on and taking off regular lower body clothing?: A Lot 6 Click Score: 15   End of Session    Activity Tolerance: Patient tolerated treatment well Patient left: in chair;with call bell/phone within reach;with chair alarm set;with family/visitor present  OT Visit Diagnosis: Other abnormalities of gait and mobility (R26.89);Other symptoms and signs involving cognitive function;Hemiplegia and hemiparesis Hemiplegia - Right/Left: Left Hemiplegia - dominant/non-dominant: Dominant Hemiplegia - caused by: Cerebral infarction  Time: 1610-9604 OT Time Calculation (min): 34 min Charges:  OT General Charges $OT Visit: 1 Visit OT Evaluation $OT Eval Low Complexity: 1 Low OT Treatments $Self Care/Home Management : 8-22 mins G-Codes: OT G-codes **NOT FOR INPATIENT CLASS** Functional Assessment Tool Used: AM-PAC 6 Clicks Daily Activity;Clinical  judgement Functional Limitation: Self care Self Care Current Status (V4098): At least 40 percent but less than 60 percent impaired, limited or restricted Self Care Goal Status (J1914): At least 20 percent but less than 40 percent impaired, limited or restricted   Richrd Prime, MPH, MS, OTR/L ascom [phone] 07/31/17, 2:33 PM

## 2017-07-31 NOTE — Progress Notes (Signed)
Medications administered by student RN [id number] with supervision of Clinical Instructor Kellis Topete MSN, RN-BC or patient's assigned RN.   

## 2017-08-01 ENCOUNTER — Encounter: Payer: Self-pay | Admitting: Physical Medicine and Rehabilitation

## 2017-08-01 ENCOUNTER — Inpatient Hospital Stay (HOSPITAL_COMMUNITY)
Admission: RE | Admit: 2017-08-01 | Discharge: 2017-08-13 | DRG: 057 | Disposition: A | Discharge status: Home Health | Payer: Medicare Other | Source: Intra-hospital | Attending: Physical Medicine & Rehabilitation | Admitting: Physical Medicine & Rehabilitation

## 2017-08-01 ENCOUNTER — Other Ambulatory Visit: Payer: Self-pay

## 2017-08-01 DIAGNOSIS — I959 Hypotension, unspecified: Secondary | ICD-10-CM | POA: Diagnosis not present

## 2017-08-01 DIAGNOSIS — G8929 Other chronic pain: Secondary | ICD-10-CM | POA: Diagnosis present

## 2017-08-01 DIAGNOSIS — F419 Anxiety disorder, unspecified: Secondary | ICD-10-CM | POA: Diagnosis present

## 2017-08-01 DIAGNOSIS — Z8249 Family history of ischemic heart disease and other diseases of the circulatory system: Secondary | ICD-10-CM | POA: Diagnosis not present

## 2017-08-01 DIAGNOSIS — R269 Unspecified abnormalities of gait and mobility: Secondary | ICD-10-CM | POA: Diagnosis not present

## 2017-08-01 DIAGNOSIS — M19012 Primary osteoarthritis, left shoulder: Secondary | ICD-10-CM | POA: Diagnosis present

## 2017-08-01 DIAGNOSIS — R7303 Prediabetes: Secondary | ICD-10-CM | POA: Diagnosis present

## 2017-08-01 DIAGNOSIS — R0981 Nasal congestion: Secondary | ICD-10-CM | POA: Diagnosis present

## 2017-08-01 DIAGNOSIS — Z888 Allergy status to other drugs, medicaments and biological substances status: Secondary | ICD-10-CM

## 2017-08-01 DIAGNOSIS — E46 Unspecified protein-calorie malnutrition: Secondary | ICD-10-CM | POA: Diagnosis present

## 2017-08-01 DIAGNOSIS — R0989 Other specified symptoms and signs involving the circulatory and respiratory systems: Secondary | ICD-10-CM | POA: Diagnosis not present

## 2017-08-01 DIAGNOSIS — I10 Essential (primary) hypertension: Secondary | ICD-10-CM | POA: Diagnosis present

## 2017-08-01 DIAGNOSIS — R296 Repeated falls: Secondary | ICD-10-CM | POA: Diagnosis present

## 2017-08-01 DIAGNOSIS — I639 Cerebral infarction, unspecified: Secondary | ICD-10-CM

## 2017-08-01 DIAGNOSIS — E871 Hypo-osmolality and hyponatremia: Secondary | ICD-10-CM | POA: Diagnosis present

## 2017-08-01 DIAGNOSIS — I169 Hypertensive crisis, unspecified: Secondary | ICD-10-CM | POA: Diagnosis not present

## 2017-08-01 DIAGNOSIS — I8393 Asymptomatic varicose veins of bilateral lower extremities: Secondary | ICD-10-CM | POA: Diagnosis present

## 2017-08-01 DIAGNOSIS — G8194 Hemiplegia, unspecified affecting left nondominant side: Secondary | ICD-10-CM | POA: Diagnosis not present

## 2017-08-01 DIAGNOSIS — M24512 Contracture, left shoulder: Secondary | ICD-10-CM | POA: Diagnosis not present

## 2017-08-01 DIAGNOSIS — I69392 Facial weakness following cerebral infarction: Secondary | ICD-10-CM | POA: Diagnosis not present

## 2017-08-01 DIAGNOSIS — I69393 Ataxia following cerebral infarction: Secondary | ICD-10-CM | POA: Diagnosis not present

## 2017-08-01 DIAGNOSIS — E8809 Other disorders of plasma-protein metabolism, not elsewhere classified: Secondary | ICD-10-CM

## 2017-08-01 DIAGNOSIS — K219 Gastro-esophageal reflux disease without esophagitis: Secondary | ICD-10-CM | POA: Diagnosis present

## 2017-08-01 DIAGNOSIS — M19011 Primary osteoarthritis, right shoulder: Secondary | ICD-10-CM | POA: Diagnosis present

## 2017-08-01 DIAGNOSIS — D62 Acute posthemorrhagic anemia: Secondary | ICD-10-CM | POA: Diagnosis present

## 2017-08-01 DIAGNOSIS — I272 Pulmonary hypertension, unspecified: Secondary | ICD-10-CM | POA: Diagnosis present

## 2017-08-01 DIAGNOSIS — I69398 Other sequelae of cerebral infarction: Secondary | ICD-10-CM | POA: Diagnosis not present

## 2017-08-01 DIAGNOSIS — I69354 Hemiplegia and hemiparesis following cerebral infarction affecting left non-dominant side: Secondary | ICD-10-CM | POA: Diagnosis present

## 2017-08-01 DIAGNOSIS — Z880 Allergy status to penicillin: Secondary | ICD-10-CM

## 2017-08-01 DIAGNOSIS — Z87891 Personal history of nicotine dependence: Secondary | ICD-10-CM | POA: Diagnosis not present

## 2017-08-01 DIAGNOSIS — Z79899 Other long term (current) drug therapy: Secondary | ICD-10-CM

## 2017-08-01 DIAGNOSIS — Z881 Allergy status to other antibiotic agents status: Secondary | ICD-10-CM | POA: Diagnosis not present

## 2017-08-01 LAB — CREATININE, SERUM: Creatinine, Ser: 0.77 mg/dL (ref 0.44–1.00)

## 2017-08-01 LAB — CBC
HCT: 34 % — ABNORMAL LOW (ref 35.0–47.0)
Hemoglobin: 11.1 g/dL — ABNORMAL LOW (ref 12.0–16.0)
MCH: 27 pg (ref 26.0–34.0)
MCHC: 32.7 g/dL (ref 32.0–36.0)
MCV: 82.7 fL (ref 80.0–100.0)
PLATELETS: 216 10*3/uL (ref 150–440)
RBC: 4.11 MIL/uL (ref 3.80–5.20)
RDW: 14.9 % — AB (ref 11.5–14.5)
WBC: 5.7 10*3/uL (ref 3.6–11.0)

## 2017-08-01 MED ORDER — POLYETHYLENE GLYCOL 3350 17 G PO PACK
17.0000 g | PACK | Freq: Every day | ORAL | Status: DC
  Administered 2017-08-03 – 2017-08-08 (×3): 17 g via ORAL
  Filled 2017-08-01 (×12): qty 1

## 2017-08-01 MED ORDER — CLOPIDOGREL BISULFATE 75 MG PO TABS
75.0000 mg | ORAL_TABLET | Freq: Every day | ORAL | Status: DC
  Administered 2017-08-02 – 2017-08-13 (×12): 75 mg via ORAL
  Filled 2017-08-01 (×12): qty 1

## 2017-08-01 MED ORDER — PANTOPRAZOLE SODIUM 40 MG PO TBEC
40.0000 mg | DELAYED_RELEASE_TABLET | Freq: Every day | ORAL | Status: DC
  Administered 2017-08-02 – 2017-08-13 (×12): 40 mg via ORAL
  Filled 2017-08-01 (×12): qty 1

## 2017-08-01 MED ORDER — FLEET ENEMA 7-19 GM/118ML RE ENEM: 1.0000 | ENEMA | Freq: Once | RECTAL | Status: DC | PRN

## 2017-08-01 MED ORDER — HYDRALAZINE HCL 50 MG PO TABS: 50.0000 mg | ORAL_TABLET | Freq: Three times a day (TID) | ORAL | Status: DC

## 2017-08-01 MED ORDER — ENOXAPARIN SODIUM 40 MG/0.4ML ~~LOC~~ SOLN
40.0000 mg | SUBCUTANEOUS | Status: DC
  Administered 2017-08-01 – 2017-08-12 (×12): 40 mg via SUBCUTANEOUS
  Filled 2017-08-01 (×12): qty 0.4

## 2017-08-01 MED ORDER — ALUM & MAG HYDROXIDE-SIMETH 200-200-20 MG/5ML PO SUSP
30.0000 mL | ORAL | Status: DC | PRN
  Administered 2017-08-08 – 2017-08-13 (×8): 30 mL via ORAL
  Filled 2017-08-01 (×10): qty 30

## 2017-08-01 MED ORDER — ATENOLOL 100 MG PO TABS
100.0000 mg | ORAL_TABLET | Freq: Every day | ORAL | Status: DC
  Administered 2017-08-02 – 2017-08-13 (×12): 100 mg via ORAL
  Filled 2017-08-01 (×12): qty 1

## 2017-08-01 MED ORDER — POTASSIUM CHLORIDE CRYS ER 10 MEQ PO TBCR
10.0000 meq | EXTENDED_RELEASE_TABLET | Freq: Two times a day (BID) | ORAL | Status: DC
  Administered 2017-08-01 – 2017-08-13 (×24): 10 meq via ORAL
  Filled 2017-08-01 (×24): qty 1

## 2017-08-01 MED ORDER — BISACODYL 10 MG RE SUPP: 10.0000 mg | Freq: Every day | RECTAL | Status: DC | PRN

## 2017-08-01 MED ORDER — DIPHENHYDRAMINE HCL 12.5 MG/5ML PO ELIX: 12.5000 mg | ORAL_SOLUTION | Freq: Four times a day (QID) | ORAL | Status: DC | PRN

## 2017-08-01 MED ORDER — ALPRAZOLAM 0.25 MG PO TABS: 0.2500 mg | ORAL_TABLET | Freq: Two times a day (BID) | ORAL | Status: DC | PRN

## 2017-08-01 MED ORDER — ACETAMINOPHEN 325 MG PO TABS
325.0000 mg | ORAL_TABLET | ORAL | Status: DC | PRN
  Administered 2017-08-02 – 2017-08-08 (×6): 650 mg via ORAL
  Filled 2017-08-01 (×6): qty 2

## 2017-08-01 MED ORDER — TRAZODONE HCL 50 MG PO TABS
25.0000 mg | ORAL_TABLET | Freq: Every evening | ORAL | Status: DC | PRN
  Filled 2017-08-01 (×2): qty 1

## 2017-08-01 MED ORDER — ALPRAZOLAM 0.25 MG PO TABS: 0.2500 mg | ORAL_TABLET | Freq: Three times a day (TID) | ORAL | Status: DC | PRN

## 2017-08-01 MED ORDER — HYDRALAZINE HCL 10 MG PO TABS
10.0000 mg | ORAL_TABLET | Freq: Three times a day (TID) | ORAL | Status: DC | PRN
Start: 2017-08-01 — End: 2017-08-13
  Administered 2017-08-02: 10 mg via ORAL
  Filled 2017-08-01: qty 1

## 2017-08-01 MED ORDER — HYDRALAZINE HCL 50 MG PO TABS
50.0000 mg | ORAL_TABLET | Freq: Three times a day (TID) | ORAL | Status: DC
  Administered 2017-08-01 – 2017-08-13 (×35): 50 mg via ORAL
  Filled 2017-08-01 (×36): qty 1

## 2017-08-01 MED ORDER — POLYETHYLENE GLYCOL 3350 17 G PO PACK: 17.0000 g | PACK | Freq: Every day | ORAL | Status: DC | PRN

## 2017-08-01 MED ORDER — ROSUVASTATIN CALCIUM 20 MG PO TABS: 20.0000 mg | ORAL_TABLET | Freq: Every day | ORAL | 11 refills | Status: DC

## 2017-08-01 MED ORDER — HYDRALAZINE HCL 50 MG PO TABS
50.0000 mg | ORAL_TABLET | Freq: Three times a day (TID) | ORAL | Status: DC
  Administered 2017-08-01: 16:00:00 50 mg via ORAL
  Filled 2017-08-01: qty 1

## 2017-08-01 MED ORDER — PROCHLORPERAZINE MALEATE 5 MG PO TABS: 5.0000 mg | ORAL_TABLET | Freq: Four times a day (QID) | ORAL | Status: DC | PRN

## 2017-08-01 MED ORDER — LISINOPRIL 40 MG PO TABS
40.0000 mg | ORAL_TABLET | Freq: Every day | ORAL | Status: DC
  Administered 2017-08-02 – 2017-08-13 (×12): 40 mg via ORAL
  Filled 2017-08-01 (×12): qty 1

## 2017-08-01 MED ORDER — PROCHLORPERAZINE 25 MG RE SUPP
12.5000 mg | Freq: Four times a day (QID) | RECTAL | Status: DC | PRN
  Filled 2017-08-01: qty 1

## 2017-08-01 MED ORDER — ASPIRIN EC 81 MG PO TBEC
81.0000 mg | DELAYED_RELEASE_TABLET | Freq: Every day | ORAL | Status: DC
  Administered 2017-08-02 – 2017-08-13 (×12): 81 mg via ORAL
  Filled 2017-08-01 (×12): qty 1

## 2017-08-01 MED ORDER — ASPIRIN 81 MG PO TBEC: 81.0000 mg | DELAYED_RELEASE_TABLET | Freq: Every day | ORAL | Status: DC

## 2017-08-01 MED ORDER — CLOPIDOGREL BISULFATE 75 MG PO TABS: 75.0000 mg | ORAL_TABLET | Freq: Every day | ORAL | Status: DC

## 2017-08-01 MED ORDER — METHOCARBAMOL 500 MG PO TABS: 250.0000 mg | ORAL_TABLET | Freq: Four times a day (QID) | ORAL | Status: DC | PRN

## 2017-08-01 MED ORDER — GUAIFENESIN-DM 100-10 MG/5ML PO SYRP: 5.0000 mL | ORAL_SOLUTION | Freq: Four times a day (QID) | ORAL | Status: DC | PRN

## 2017-08-01 MED ORDER — PROCHLORPERAZINE EDISYLATE 5 MG/ML IJ SOLN: 5.0000 mg | Freq: Four times a day (QID) | INTRAMUSCULAR | Status: DC | PRN

## 2017-08-01 NOTE — Progress Notes (Signed)
I have insurance approval and a bed available to admit pt to today. I have notified son, Gerri [place], by phone and RN CM. We will make the arrangements to admit today. [phone]

## 2017-08-01 NOTE — H&P (Signed)
Physical Medicine and Rehabilitation Admission H&P       Chief Complaint  Patient presents with  . Stroke with functional deficits due to left sided weakness    HPI:  Debbie Hampton is an 83 years old female with history of HTN, GERD who was admitted on 07/29/17 with reports of multiple falls for 2-3 days PTA, left sided weakness and hypertensive emergency--SBP 260 pr reports.  MRI brain done revealing 1.5 cm infarct in deep right periventricular white matter. 2D echo showed EF 60-65% with mild MVR, mild to moderate left atrial dilatation, and moderate pulmonary HTN. Carotid dopplers revealed moderate to large amount of atherosclerotic plaque with R > L 50-69% ICA stenosis. 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  Neurology recommended continuing ASA/Plavix for secondary stroke prevention and repeat follow up imaging to monitor aneurysm in 6- 12 months.   Therapy ongoing and patient with limitations due to left sided weakness with balance deficits, LLE ataxia, tachycardia with activity and poor attention affecting ability to carry out ADLs and mobility.    Review of Systems  Constitutional: Negative for chills and fever.  HENT: Negative for hearing loss and tinnitus.   Eyes: Negative for blurred vision and double vision.  Respiratory: Negative for cough and hemoptysis.   Cardiovascular: Negative for chest pain and palpitations.  Gastrointestinal: Positive for heartburn. Negative for constipation, nausea and vomiting.  Genitourinary: Negative for dysuria and urgency.  Musculoskeletal: Positive for joint pain (bilateral shoulder and right hip pain).  Skin: Negative for itching and rash.  Neurological: Positive for focal weakness. Negative for dizziness and headaches.  Psychiatric/Behavioral: The patient has insomnia (chronic).           Past Medical History:   Diagnosis Date  . Bilateral shoulder pain   . Colon polyp   . Constipation   . GERD (gastroesophageal reflux disease)   . Hypertension   . Impaired fasting glucose   . Syncope 03/2016   with [facility]I  . Varicose veins of both lower extremities          Past Surgical History:  Procedure Laterality Date  . TONSILLECTOMY    . TUMOR REMOVAL     ovaries         Family History  Problem Relation Age of Onset  . Hypertension Other   . Breast cancer Sister     Social History:  Married. 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 She reports that she "quit smoking cigarettes years ago".   She has never used smokeless tobacco. She reports that she does not drink alcohol or use drugs.         Allergies  Allergen Reactions  . Penicillins     Has patient had a PCN reaction causing immediate rash, facial/tongue/throat swelling, SOB or lightheadedness with hypotension: Yes Has patient had a PCN reaction causing severe rash involving mucus membranes or skin necrosis: No Has patient had a PCN reaction that required hospitalization No Has patient had a PCN reaction occurring within the last 10 years: Yes If all of the above answers are "NO", then may proceed with Cephalosporin use.   . Aloe Hives and Rash  . Amlodipine Hives, Rash and Hypertension  . Cephalexin Rash  . Eggs Or Egg-Derived Products Hives and Rash  . Metoprolol Hives, Palpitations and Hypertension  .  Miconazole Swelling and Rash  . Neosporin [Neomycin-Bacitracin Zn-Polymyx] Rash  . Trandolapril-Verapamil Hcl Er Rash          Medications Prior to Admission  Medication Sig Dispense Refill  . atenolol (TENORMIN) 100 MG tablet Take 1 tablet (100 mg total) by mouth daily. 30 tablet 1  . esomeprazole (NEXIUM) 40 MG capsule Take 40 mg by mouth daily. Over the counter extended release    . hydrALAZINE (APRESOLINE) 25 MG tablet Take  1 tablet (25 mg total) by mouth 3 (three) times daily. 90 tablet 1  . hydrochlorothiazide (HYDRODIURIL) 25 MG tablet Take 12.5 mg by mouth daily.     Marland Kitchen lisinopril (PRINIVIL,ZESTRIL) 40 MG tablet Take 40 mg by mouth daily.  3    Drug Regimen Review  Drug regimen was reviewed and remains appropriate with no significant issues identified  Home: Home Living Family/patient expects to be discharged to:: Private residence Living Arrangements: Spouse/significant other Available Help at Discharge: Available 24 hours/day(cousin and hired caregivers to provide 24/7 supervision) Type of Home: (lives independdent home at [facility] in [place]) Home Access: Stairs to enter [employer] of Steps: 4 Entrance Stairs-Rails: Right, Left Home Layout: One level Bathroom Shower/Tub: Gaffer, Door [employer] Toilet: Handicapped height Bathroom Accessibility: Yes Home Equipment: Hand held shower head  Lives With: Spouse   Functional History: Prior Function Level of Independence: Independent Comments: Indep with ADLs, household and community mobility without assist device; + driving; caregiver 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 No falls other than 2 just prior to admission in past 12 months.  Functional Status:  Mobility: Bed Mobility Overal bed mobility: Needs Assistance Bed Mobility: Supine to Sit Supine to sit: Supervision General bed mobility comments: deferred, up in recliner Transfers Overall transfer level: Needs assistance Transfers: Sit to/from Stand Sit to Stand: Mod assist General transfer comment: VC for hands/feet placement, posterior weight shift Ambulation/Gait Ambulation/Gait assistance: Mod assist Ambulation Distance (Feet): 25 Feet Assistive device: Rolling walker (2 wheeled) General Gait Details: step to gait pattern with ataxic L LE foot placement, poor L LE hip activation/stability (mod manual  cuing to faciltiate); L lateral lean (mild pushing at times).  Poor balance, poor stability.  ADL: ADL Overall ADL's : Needs assistance/impaired Eating/Feeding: Sitting, Set up Grooming: Sitting, Set up Upper Body Bathing: Sitting, Set up, Minimal assistance Lower Body Bathing: Sitting/lateral leans, Minimal assistance Upper Body Dressing : Sitting, Minimal assistance Lower Body Dressing: Sitting/lateral leans, Min guard Toilet Transfer: Stand-pivot, BSC, Moderate assistance  Cognition: Cognition Overall Cognitive Status: Within Functional Limits for tasks assessed Orientation Level: Oriented X4 Cognition Arousal/Alertness: Awake/alert Behavior During Therapy: WFL for tasks assessed/performed Overall Cognitive Status: Within Functional Limits for tasks assessed General Comments: oriented x4; follows simple commands, additional time for processing and task initiation-suspect some degree of ataxia; immediate and delayed recall 3/3   Blood pressure (!) 163/62, pulse 69, temperature 97.6 F (36.4 C), temperature source Oral, resp. rate 16, height 5' (1.524 m), weight 53.5 kg (117 lb 14.4 oz), SpO2 98 %. Physical Exam  Constitutional: She is oriented to person, place, and time. She appears well-developed and well-nourished. No distress.  HENT:  Head: Normocephalic and atraumatic.  Mouth/Throat: Oropharynx is clear and moist.  Neck: Normal range of motion. Neck supple.  Respiratory: Effort normal and breath sounds normal. No stridor. No respiratory distress.  GI: Soft. Bowel sounds are normal. She exhibits no distension. There is no tenderness.  Musculoskeletal: She exhibits no  edema or tenderness.  Right shoulder with arthritic changes and discomfort with ROM. Frozen left shoulder due to prior injury.   Neurological: She is alert and oriented to person, place, and time.  Mild left facial weakness. Speech clear but tangential needing redirection. Able to follow simple motor  commands. Mild LUE > LLE weakness with decreased motor control.   Skin: Skin is warm and dry. She is not diaphoretic.  Psychiatric: Her behavior is normal. Thought content normal. Her mood appears anxious. Her speech is tangential.    LabResultsLast48Hours        Results for orders placed or performed during the hospital encounter of 07/29/17 (from the past 48 hour(s))  CBC     Status: Abnormal   Collection Time: 08/01/17  5:04 AM  Result Value Ref Range   WBC 5.7 3.6 - 11.0 K/uL   RBC 4.11 3.80 - 5.20 MIL/uL   Hemoglobin 11.1 (L) 12.0 - 16.0 g/dL   HCT 34.0 (L) 35.0 - 47.0 %   MCV 82.7 80.0 - 100.0 fL   MCH 27.0 26.0 - 34.0 pg   MCHC 32.7 32.0 - 36.0 g/dL   RDW 14.9 (H) 11.5 - 14.5 %   Platelets 216 150 - 440 K/uL  Creatinine, serum     Status: None   Collection Time: 08/01/17  5:04 AM  Result Value Ref Range   Creatinine, Ser 0.77 0.44 - 1.00 mg/dL   GFR calc non Af Amer >60 >60 mL/min   GFR calc Af Amer >60 >60 mL/min    Comment: (NOTE) The eGFR has been calculated using the CKD EPI equation. This calculation has not been validated in all clinical situations. eGFR's persistently <60 mL/min signify possible Chronic Kidney Disease.      ImagingResults(Last48hours)  No results found.       Medical Problem List and Plan: 1.  Left hemiparesis and functional deficits secondary to right periventricular white matter infarct             -admit to inpatient rehab 2.  DVT Prophylaxis/Anticoagulation: Pharmaceutical: Lovenox 3. Pain Management: tylenol prn with local Mesures for chronic pain.   4. Mood: LCSW to follow for evaluation and support.  5. Neuropsych: This patient is not fully capable of making decisions on her own behalf. 6. Skin/Wound Care: Routine pressure relief measures. Maintain adequate nutritional and hydration status.  7. Fluids/Electrolytes/Nutrition: Monitor I/O. Check lytes in am.  8. Labile HTN: Poorly controlled per  records--question component of white coat HTN. Has been seen by nephrology --negative renal work up. Will monitor qid for now--continue lisinopril, hydralazine and tenormin. Off HCTZ at this time. Avoid hypotension. 9. GERD: Continue Protonix.  10. Hyponatremia: Chronic due to HCTZ? Will recheck labs in am 11. Prediabetic: Hgb A1c- 5.9.      Post Admission Physician Evaluation: 1. Functional deficits secondary  to right CVA. 2. Patient is admitted to receive collaborative, interdisciplinary care between the physiatrist, rehab nursing staff, and therapy team. 3. Patient's level of medical complexity and substantial therapy needs in context of that medical necessity cannot be provided at a lesser intensity of care such as a SNF. 4. Patient has experienced substantial functional loss from his/her baseline which was documented above under the "Functional History" and "Functional Status" headings.  Judging by the patient's diagnosis, physical exam, and functional history, the patient has potential for functional progress which will result in measurable gains while on inpatient rehab.  These gains will be of substantial and practical use upon discharge  in facilitating mobility and self-care at the household level. 5. Physiatrist will provide 24 hour management of medical needs as well as oversight of the therapy plan/treatment and provide guidance as appropriate regarding the interaction of the two. 6. The Preadmission Screening has been reviewed and patient status is unchanged unless otherwise stated above. 7. 24 hour rehab nursing will assist with bladder management, bowel management, safety, skin/wound care, disease management, medication administration and patient education  and help integrate therapy concepts, techniques,education, etc. 8. PT will assess and treat for/with: Lower extremity strength, range of motion, stamina, balance, functional mobility, safety, adaptive techniques and equipment,  NMR.   Goals are: supervision. 9. OT will assess and treat for/with: ADL's, functional mobility, safety, upper extremity strength, adaptive techniques and equipment, NMR.   Goals are: supervision. Therapy may proceed with showering this patient. 10. SLP will assess and treat for/with: n/a.  Goals are: n/a. 11. Case Management and Social Worker will assess and treat for psychological issues and discharge planning. 12. Team conference will be held weekly to assess progress toward goals and to determine barriers to discharge. 13. Patient will receive at least 3 hours of therapy per day at least 5 days per week. 14. ELOS: 10-14 days       15. Prognosis:  excellent     Meredith Staggers, MD, [place] [facility] 08/01/2017  Bary Leriche, Hershal Coria 08/01/2017

## 2017-08-01 NOTE — Progress Notes (Signed)
Ranelle Oyster, MD  Physician  Physical Medicine and Rehabilitation  PMR Pre-admission  Signed  Date of Service:  07/31/2017 3:18 PM       Related encounter: ED to Hosp-Admission (Current) from 07/29/2017 in [facility] (1C)      Signed           [] Hide copied text  [] Hover for details     Secondary Market PMR Admission Coordinator Pre-Admission Assessment  Patient: Debbie Hampton is an 83 y.o., female MRN: [id number] DOB: 11-23-1931 Height: 5' (152.4 cm) Weight: 53.5 kg (117 lb 14.4 oz)  Insurance Information HMO:    PPO: yes     PCP:      IPA:      80/20:      OTHER: medicare advantage plan PRIMARY: [employer] Medicare     Policy#: [id number]      Subscriber: pt CM Name: Debbie Hampton      Phone#: [phone]     Fax#: [phone] Pre-Cert#: [id number]     Approved for 7 days with f/u Rebeca Alert phone [phone] fax [phone] Employer: retired Benefits:  Phone #: [phone]     Name:  Eff. Date: 09/16/16     Deduct: none      Out of Pocket Max: $4500      Life Max: none CIR: $345 co pay per day days 1-5      SNF:  No co pay days 1-20; $160 co pay per day days 21-49; no co pay days 50-100 Outpatient: $40 co pay per visit     Co-Pay:  Visits per medical neccesity Home Health: 100%      Co-Pay: visits per medical neccesity DME: 80%     Co-Pay: 20% Providers: in network  SECONDARY: none       Medicaid Application Date:       Case Manager:  Disability Application Date:       Case Worker:   Emergency Actuary Information    Name Relation Home Work Mobile   [place] Spouse [phone]     No name specified          Current Medical History  Patient Admitting Diagnosis:  Right CVA  History of Present Illness: Debbie Hampton is an 83 years old female with history of HTN, GERD who was admitted on 07/29/17 with reports of multiple falls for 2-3 days PTA, left sided  weakness and hypertensive emergency-. MRI brain done revealing 1.5 cm infarct in deep right periventricular white matter. 2D echo showed EF 60-65% with mild MVR, mild to moderate left atrial dilatation, and moderate pulmonary HTN. Carotid dopplers revealed moderate to large amount of atherosclerotic plaque with R >L 50-69% ICA stenosis. 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 Neurology recommended continuing ASA/Plavix for secondary stroke prevention and repeat follow up imaging to monitor aneurysm in 6- 12 months.   Therapy ongoing and patient with limitations due to left sided weakness with balance deficits, LLE ataxia, tachycardia with activity and poor attention affecting ability to carry out ADLs and mobility.  Patient's medical record from [facility] has been reviewed by the rehabilitation admission coordinator and physician.  NIH Stroke scale: 0 Glascow Coma Scale:  Past Medical History  Past Medical History:  Diagnosis Date  . GERD (gastroesophageal reflux disease)   . Hypertension  Family History   family history includes Hypertension in her other.  Prior Rehab/Hospitalizations Has the patient had major surgery during 100 days prior to admission? No              Current Medications See MAR  Patients Current Diet:  Regular consistency diet with thin liquids  Precautions / Restrictions Precautions Precautions: Fall Restrictions Weight Bearing Restrictions: No   Has the patient had 2 or more falls or a fall with injury in the past year?Yes. 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   Independent  Indoor Mobility: Did the patient need assistance with walking from room to room (with or without device)? Independent  Stairs: Did the patient need assistance with internal or external stairs (with or without device)? Independent  Functional Cognition: Did the patient need help planning regular tasks such as shopping or remembering to take medications? Independent  Home Assistive Devices / Equipment Home Assistive Devices/Equipment: Eyeglasses Home Equipment: Hand held shower head  Prior Device Use: Indicate devices/aids used by the patient prior to current illness, exacerbation or injury? None of the above   Prior Functional Level Current Functional Level  Bed Mobility  Independent  Min assist   Transfers  Independent  Mod assist   Mobility - Walk/Wheelchair  Independent  Mod assist; limited spontaneous righting reactions. Mod assist 25 feet. LLE with very inconsistent, ataxic foot placement, poor stability and control. Very high risk to fall with out hands on assist.. On 11/15 with HR elevation to 154 during gait efforts with pt asymptomatic. Required seated rest break for recovery to HR 90- 100.  requiring min guard tactile cues at times to maximize safety.    Upper Body Dressing  Independent  Min assist; LUE >RUE hemiparesis noted, moderate deficits in coordination and fine motor control with finger to nose and thumb opposition testing. Significant deficits in sitting and standing balance. Chronic RTC injuries L>R and baseline paresthesia digits 1-3 ( history of carpal tunnel). LUE with mild dysmetria, mod coordination deficits.   Lower Body Dressing  Independent  Min assist   Grooming  Independent  Min assist   Eating/Drinking  Independent  Min assist   Toilet Transfer  Independent  Mod assist   Bladder Continence   continent  continent   Bowel Management  continent  continent   Stair Climbing   Other(not  attempted)   Communication  intact; exagerated speech at baseline  following all commands with increased time to process and attend(pt hyperverbal and minimally distractalbe. at baseline per s)   Memory  intact  intact . Mild intermittent confusion noted; required increased time for processing, task initiation /sequencing. May be some degree of apraxia.  Cooking/Meal Prep  independent      Housework  independent    Money Management  independent    Driving   yes      Special needs/care consideration BiPAP/CPAP  N/a CPM  N/a Continuous Drip IV  N/a Dialysis  N/a Life Vest  N/a Oxygen  N/a Special Bed  N/a Trach Size  N/a Wound Vac n/a Skin ecchymosis to right shoulder arm and knee, BUE ecchymosis Bowel mgmt: continent Bladder mgmt: continent Diabetic mgmt n/a  Previous Home Environment Living Arrangements: Spouse/significant other  Lives With: Spouse Available Help at Discharge: Available 24 hours/day(cousin  and hired caregivers to provide 24/7 supervision) Type of Home: (lives independdent home at [facility] [place] Home Layout: One level Home Access: Stairs to enter Entrance Stairs-Rails: Right, Left Entrance Stairs-Number of Steps: 4 Bathroom Shower/Tub: Psychologist, counsellingWalk-in shower, Sport and exercise psychologistDoor Bathroom Toilet: Handicapped height Bathroom Accessibility: Yes How Accessible: Accessible via walker Home Care Services: No  Discharge Living Setting Plans for Discharge Living Setting: Patient's home([facility] Type of Home at Discharge: House Discharge Home Layout: One level Discharge Home Access: Stairs to enter Entrance Stairs-Rails: Right, Left, Can reach both Entrance Stairs-Number of Steps: 4 Discharge Bathroom Shower/Tub: Walk-in shower Discharge Bathroom Toilet: Handicapped height Discharge Bathroom Accessibility: Yes How Accessible: Accessible via walker Does the patient have any problems obtaining  your medications?: No  Social/Family/Support Systems Patient Roles: Spouse, Parent, Caregiver(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 Cousin and hired caregivers will provide 24/7 supervision for pt in her home. I discussed with Allie DimmerWesley Dehaan on 07/31/2017 by in home that insurance will not cover both inpt rehab admission as well as SNF rehab. Home caregivers will be an out of pocket expense.   Goals/Additional Needs Patient/Family Goal for Rehab: supervision with PT and OT Expected length of stay: ELOS 10- 14 days Pt/Family Agrees to Admission and willing to participate: Yes Program Orientation Provided & Reviewed with Pt/Caregiver Including Roles  & Responsibilities: Yes  Patient Condition: [facility], spoke with patient and her son, Gerri SporeWesley, by phone. patient will benefit from ongoing PT, OT, and SLP which will be provided three hours per day in an Inpatient Acute Rehabilitative Admission. She will also received the coordinated Team approach for therapies, Rehabilitation MD as well as Nursing care. Pt is overall min to mod assist with mobility and all adls. We will admit to Acute Inpatient Rehabilitation today.  Preadmission Screen Completed By:  Clois DupesBoyette, Leah Skora Godwin, 07/31/2017 3:19 PM ______________________________________________________________________   Discussed status with Dr. Riley KillSwartz on  08/01/2017  at  [phone] and received telephone approval for admission  today.  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 Does the need for close, 24 hr/day  Medical supervision in concert with the patient's rehab needs make it unreasonable for this patient to be served in a less intensive setting? Yes 2. Co-Morbidities requiring supervision/potential complications: Hypertension and post stroke sequela 3. Due to bladder management, bowel management, safety, skin/wound care, disease management, medication administration, pain management and patient education, does the patient require 24 hr/day rehab nursing? Yes 4. Does the patient require coordinated care of a physician, rehab nurse, PT (1-2 hrs/day, 5 days/week) and OT (1-2 hrs/day, 5 days/week) to address physical and functional deficits in the context of the above medical diagnosis(es)? Yes Addressing deficits in the following areas: balance, endurance, locomotion, strength, transferring, bowel/bladder control, bathing, dressing, feeding, grooming, toileting and psychosocial support 5. Can the patient actively participate in an intensive therapy program of at least 3 hrs of therapy 5 days a week? Yes 6. The  potential for patient to make measurable gains while on inpatient rehab is excellent 7. Anticipated functional outcomes upon discharge from inpatients are: supervision PT, supervision OT, n/a SLP 8. Estimated rehab length of stay to reach the above functional goals is: 10-14 days 9. Does the patient have adequate social supports to accommodate these discharge functional goals? Yes 10. Anticipated D/C setting: Home 11. Anticipated post D/C treatments: HH therapy 12. Overall Rehab/Functional Prognosis: excellent    RECOMMENDATIONS: This patient's condition is appropriate for continued rehabilitative care in the following setting: CIR Patient has agreed to participate in  recommended program. Yes Note that insurance prior authorization may be required for reimbursement for recommended care.  Comment: Admit to inpatient rehab today  Ranelle OysterZachary T. Swartz, MD, [facility] LLCFAAPMR  Physical Medicine & Rehabilitation 08/01/2017   Clois DupesBoyette, Dillyn Menna Godwin 07/31/2017          Revision History    Date/Time User Provider Type Action  08/01/2017 10:53 AM Ranelle OysterSwartz, Zachary T, MD Physician Sign  08/01/2017 9:38 AM Standley BrookingBoyette, Vashti Bolanos G, RN Rehab Admission Coordinator Share  07/31/2017 4:06 PM Standley BrookingBoyette, Tavaras Goody G, RN Rehab Admission Coordinator Share  07/31/2017 3:46 PM Standley BrookingBoyette, Terrell Shimko G, RN Rehab Admission Coordinator Share  View Details Report

## 2017-08-01 NOTE — H&P (Signed)
Physical Medicine and Rehabilitation Admission H&P    Chief Complaint  Patient presents with  . Stroke with functional deficits due to left sided weakness    HPI:  Debbie Hampton is an 83 years old female with history of HTN, GERD who was admitted on 07/29/17 with reports of multiple falls for 2-3 days PTA, left sided weakness and hypertensive emergency--SBP 260 pr reports.  MRI brain done revealing 1.5 cm infarct in deep right periventricular white matter. 2D echo showed EF 60-65% with mild MVR, mild to moderate left atrial dilatation, and moderate pulmonary HTN. Carotid dopplers revealed moderate to large amount of atherosclerotic plaque with R > L 50-69% ICA stenosis. 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  Neurology recommended continuing ASA/Plavix for secondary stroke prevention and repeat follow up imaging to monitor aneurysm in 6- 12 months.   Therapy ongoing and patient with limitations due to left sided weakness with balance deficits, LLE ataxia, tachycardia with activity and poor attention affecting ability to carry out ADLs and mobility.    Review of Systems  Constitutional: Negative for chills and fever.  HENT: Negative for hearing loss and tinnitus.   Eyes: Negative for blurred vision and double vision.  Respiratory: Negative for cough and hemoptysis.   Cardiovascular: Negative for chest pain and palpitations.  Gastrointestinal: Positive for heartburn. Negative for constipation, nausea and vomiting.  Genitourinary: Negative for dysuria and urgency.  Musculoskeletal: Positive for joint pain (bilateral shoulder and right hip pain).  Skin: Negative for itching and rash.  Neurological: Positive for focal weakness. Negative for dizziness and headaches.  Psychiatric/Behavioral: The patient has insomnia (chronic).       Past Medical History:  Diagnosis Date    . Bilateral shoulder pain   . Colon polyp   . Constipation   . GERD (gastroesophageal reflux disease)   . Hypertension   . Impaired fasting glucose   . Syncope 03/2016   with [facility]I  . Varicose veins of both lower extremities     Past Surgical History:  Procedure Laterality Date  . TONSILLECTOMY    . TUMOR REMOVAL     ovaries    Family History  Problem Relation Age of Onset  . Hypertension Other   . Breast cancer Sister     Social History:  Married. 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 She reports that she "quit smoking cigarettes years ago".   She has never used smokeless tobacco. She reports that she does not drink alcohol or use drugs.    Allergies  Allergen Reactions  . Penicillins     Has patient had a PCN reaction causing immediate rash, facial/tongue/throat swelling, SOB or lightheadedness with hypotension: Yes Has patient had a PCN reaction causing severe rash involving mucus membranes or skin necrosis: No Has patient had a PCN reaction that required hospitalization No Has patient had a PCN reaction occurring within the last 10 years: Yes If all of the above answers are "NO", then may proceed with Cephalosporin use.   . Aloe Hives and Rash  . Amlodipine Hives, Rash and Hypertension  . Cephalexin Rash  . Eggs Or Egg-Derived Products Hives and Rash  . Metoprolol Hives, Palpitations and Hypertension  . Miconazole Swelling and Rash  . Neosporin [Neomycin-Bacitracin Zn-Polymyx] Rash  . Trandolapril-Verapamil Hcl Er Rash  Medications Prior to Admission  Medication Sig Dispense Refill  . atenolol (TENORMIN) 100 MG tablet Take 1 tablet (100 mg total) by mouth daily. 30 tablet 1  . esomeprazole (NEXIUM) 40 MG capsule Take 40 mg by mouth daily. Over the counter extended release    . hydrALAZINE (APRESOLINE) 25 MG tablet Take 1 tablet (25 mg total) by mouth 3 (three) times daily. 90 tablet 1  .  hydrochlorothiazide (HYDRODIURIL) 25 MG tablet Take 12.5 mg by mouth daily.     Marland Kitchen lisinopril (PRINIVIL,ZESTRIL) 40 MG tablet Take 40 mg by mouth daily.  3    Drug Regimen Review  Drug regimen was reviewed and remains appropriate with no significant issues identified  Home: Home Living Family/patient expects to be discharged to:: Private residence Living Arrangements: Spouse/significant other Available Help at Discharge: Available 24 hours/day(cousin and hired caregivers to provide 24/7 supervision) Type of Home: (lives independdent home at [facility] in [place]) Home Access: Stairs to enter [employer] of Steps: 4 Entrance Stairs-Rails: Right, Left Home Layout: One level Bathroom Shower/Tub: Gaffer, Door [employer] Toilet: Handicapped height Bathroom Accessibility: Yes Home Equipment: Hand held shower head  Lives With: Spouse   Functional History: Prior Function Level of Independence: Independent Comments: Indep with ADLs, household and community mobility without assist device; + driving; caregiver 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 No falls other than 2 just prior to admission in past 12 months.  Functional Status:  Mobility: Bed Mobility Overal bed mobility: Needs Assistance Bed Mobility: Supine to Sit Supine to sit: Supervision General bed mobility comments: deferred, up in recliner Transfers Overall transfer level: Needs assistance Transfers: Sit to/from Stand Sit to Stand: Mod assist General transfer comment: VC for hands/feet placement, posterior weight shift Ambulation/Gait Ambulation/Gait assistance: Mod assist Ambulation Distance (Feet): 25 Feet Assistive device: Rolling walker (2 wheeled) General Gait Details: step to gait pattern with ataxic L LE foot placement, poor L LE hip activation/stability (mod manual cuing to faciltiate); L lateral lean (mild pushing at times).  Poor balance,  poor stability.    ADL: ADL Overall ADL's : Needs assistance/impaired Eating/Feeding: Sitting, Set up Grooming: Sitting, Set up Upper Body Bathing: Sitting, Set up, Minimal assistance Lower Body Bathing: Sitting/lateral leans, Minimal assistance Upper Body Dressing : Sitting, Minimal assistance Lower Body Dressing: Sitting/lateral leans, Min guard Toilet Transfer: Stand-pivot, BSC, Moderate assistance  Cognition: Cognition Overall Cognitive Status: Within Functional Limits for tasks assessed Orientation Level: Oriented X4 Cognition Arousal/Alertness: Awake/alert Behavior During Therapy: WFL for tasks assessed/performed Overall Cognitive Status: Within Functional Limits for tasks assessed General Comments: oriented x4; follows simple commands, additional time for processing and task initiation-suspect some degree of ataxia; immediate and delayed recall 3/3   Blood pressure (!) 163/62, pulse 69, temperature 97.6 F (36.4 C), temperature source Oral, resp. rate 16, height 5' (1.524 m), weight 53.5 kg (117 lb 14.4 oz), SpO2 98 %. Physical Exam  Constitutional: She is oriented to person, place, and time. She appears well-developed and well-nourished. No distress.  HENT:  Head: Normocephalic and atraumatic.  Mouth/Throat: Oropharynx is clear and moist.  Neck: Normal range of motion. Neck supple.  Respiratory: Effort normal and breath sounds normal. No stridor. No respiratory distress.  GI: Soft. Bowel sounds are normal. She exhibits no distension. There is no tenderness.  Musculoskeletal: She exhibits no edema or tenderness.  Right shoulder with arthritic changes and discomfort with ROM. Frozen left shoulder due to prior injury.   Neurological:  She is alert and oriented to person, place, and time.  Mild left facial weakness. Speech clear but tangential needing redirection. Able to follow simple motor commands. Mild LUE > LLE weakness with decreased motor control.   Skin: Skin is warm  and dry. She is not diaphoretic.  Psychiatric: Her behavior is normal. Thought content normal. Her mood appears anxious. Her speech is tangential.    Results for orders placed or performed during the hospital encounter of 07/29/17 (from the past 48 hour(s))  CBC     Status: Abnormal   Collection Time: 08/01/17  5:04 AM  Result Value Ref Range   WBC 5.7 3.6 - 11.0 K/uL   RBC 4.11 3.80 - 5.20 MIL/uL   Hemoglobin 11.1 (L) 12.0 - 16.0 g/dL   HCT 34.0 (L) 35.0 - 47.0 %   MCV 82.7 80.0 - 100.0 fL   MCH 27.0 26.0 - 34.0 pg   MCHC 32.7 32.0 - 36.0 g/dL   RDW 14.9 (H) 11.5 - 14.5 %   Platelets 216 150 - 440 K/uL  Creatinine, serum     Status: None   Collection Time: 08/01/17  5:04 AM  Result Value Ref Range   Creatinine, Ser 0.77 0.44 - 1.00 mg/dL   GFR calc non Af Amer >60 >60 mL/min   GFR calc Af Amer >60 >60 mL/min    Comment: (NOTE) The eGFR has been calculated using the CKD EPI equation. This calculation has not been validated in all clinical situations. eGFR's persistently <60 mL/min signify possible Chronic Kidney Disease.    No results found.     Medical Problem List and Plan: 1.  Left hemiparesis and functional deficits secondary to right periventricular white matter infarct  -admit to inpatient rehab 2.  DVT Prophylaxis/Anticoagulation: Pharmaceutical: Lovenox 3. Pain Management: tylenol prn with local Mesures for chronic pain.   4. Mood: LCSW to follow for evaluation and support.  5. Neuropsych: This patient is not fully capable of making decisions on her own behalf. 6. Skin/Wound Care: Routine pressure relief measures. Maintain adequate nutritional and hydration status.  7. Fluids/Electrolytes/Nutrition: Monitor I/O. Check lytes in am.  8. Labile HTN: Poorly controlled per records--question component of white coat HTN. Has been seen by nephrology --negative renal work up. Will monitor qid for now--continue lisinopril, hydralazine and tenormin. Off HCTZ at this time.  Avoid hypotension. 9. GERD: Continue Protonix.  10. Hyponatremia: Chronic due to HCTZ? Will recheck labs in am 11. Prediabetic: Hgb A1c- 5.9.      Post Admission Physician Evaluation: 1. Functional deficits secondary  to right CVA. 2. Patient is admitted to receive collaborative, interdisciplinary care between the physiatrist, rehab nursing staff, and therapy team. 3. Patient's level of medical complexity and substantial therapy needs in context of that medical necessity cannot be provided at a lesser intensity of care such as a SNF. 4. Patient has experienced substantial functional loss from his/her baseline which was documented above under the "Functional History" and "Functional Status" headings.  Judging by the patient's diagnosis, physical exam, and functional history, the patient has potential for functional progress which will result in measurable gains while on inpatient rehab.  These gains will be of substantial and practical use upon discharge  in facilitating mobility and self-care at the household level. 5. Physiatrist will provide 24 hour management of medical needs as well as oversight of the therapy plan/treatment and provide guidance as appropriate regarding the interaction of the two. 6. The Preadmission Screening has been reviewed and patient  status is unchanged unless otherwise stated above. 7. 24 hour rehab nursing will assist with bladder management, bowel management, safety, skin/wound care, disease management, medication administration and patient education  and help integrate therapy concepts, techniques,education, etc. 8. PT will assess and treat for/with: Lower extremity strength, range of motion, stamina, balance, functional mobility, safety, adaptive techniques and equipment, NMR.   Goals are: supervision. 9. OT will assess and treat for/with: ADL's, functional mobility, safety, upper extremity strength, adaptive techniques and equipment, NMR.   Goals are: supervision.  Therapy may proceed with showering this patient. 10. SLP will assess and treat for/with: n/a.  Goals are: n/a. 11. Case Management and Social Worker will assess and treat for psychological issues and discharge planning. 12. Team conference will be held weekly to assess progress toward goals and to determine barriers to discharge. 13. Patient will receive at least 3 hours of therapy per day at least 5 days per week. 14. ELOS: 10-14 days       15. Prognosis:  excellent     Meredith Staggers, MD, [facility] 08/01/2017  Bary Leriche, Hershal Coria 08/01/2017

## 2017-08-01 NOTE — Discharge Summary (Signed)
[facility] [place] [facility]   PATIENT NAME: Debbie Hampton    MR#:  [id number]  DATE OF BIRTH:  07-16-1932  DATE OF ADMISSION:  07/29/2017 ADMITTING PHYSICIAN: Shaune Pollack, MD  DATE OF DISCHARGE: 08/01/2017  PRIMARY CARE PHYSICIAN: Kandyce Rud, MD    ADMISSION DIAGNOSIS:  weakness  DISCHARGE DIAGNOSIS:  Active Problems:   Acute CVA (cerebrovascular accident) (HCC)   SECONDARY DIAGNOSIS:   Past Medical History:  Diagnosis Date  . GERD (gastroesophageal reflux disease)   . Hypertension     HOSPITAL COURSE:   83 year old female with essential hypertension who presents with left lower extremity weakness.  1. Acute 1.5 cm infarction within the periventricular deep white matter on the right adjacent to the posterior body of the lateral ventricle Continue aspirin and Plavix as per recommendation by neurology Continue statin CTA of head and neck showed no hemodynamically significant stenosis however do show  Moderate to severe stenosis of the non-dominant proximal right vertebral artery Echocardiogram shows no evidence of thrombosis or major valvular abnormalities physical therapy is recommending acute inpatient rehabilitation.  2. Accelerated Essential hypertension: Continue atenolol, hydralazine, lisinopril Monitor blood pressureclosely well at rehabilitation.   3. GERD: Continue PPI  4. Hypokalemia: This was repleted  5. 3 mm right supraclinoid ICA aneurysm.: She will need outpatient NEUROLOGY follow up     DISCHARGE CONDITIONS AND DIET:   Stable for discharge at heart healthy cardiac diet  CONSULTS OBTAINED:  Treatment Team:  Thana Farr, MD  DRUG ALLERGIES:   Allergies  Allergen Reactions  . Penicillins     Has patient had a PCN reaction causing immediate rash, facial/tongue/throat swelling, SOB or lightheadedness with hypotension: Yes Has patient had a PCN reaction causing severe rash involving mucus membranes or  skin necrosis: No Has patient had a PCN reaction that required hospitalization No Has patient had a PCN reaction occurring within the last 10 years: Yes If all of the above answers are "NO", then may proceed with Cephalosporin use.   . Aloe Hives and Rash  . Amlodipine Hives, Rash and Hypertension  . Cephalexin Rash  . Metoprolol Hives, Palpitations and Hypertension  . Miconazole Swelling and Rash  . Neosporin [Neomycin-Bacitracin Zn-Polymyx] Rash  . Trandolapril-Verapamil Hcl Er Rash    DISCHARGE MEDICATIONS:   Current Discharge Medication List    START taking these medications   Details  aspirin EC 81 MG EC tablet Take 1 tablet (81 mg total) daily by mouth.    clopidogrel (PLAVIX) 75 MG tablet Take 1 tablet (75 mg total) daily by mouth.    rosuvastatin (CRESTOR) 20 MG tablet Take 1 tablet (20 mg total) at bedtime by mouth. Qty: 30 tablet, Refills: 11      CONTINUE these medications which have CHANGED   Details  hydrALAZINE (APRESOLINE) 50 MG tablet Take 1 tablet (50 mg total) 3 (three) times daily by mouth.      CONTINUE these medications which have NOT CHANGED   Details  atenolol (TENORMIN) 100 MG tablet Take 1 tablet (100 mg total) by mouth daily. Qty: 30 tablet, Refills: 1    esomeprazole (NEXIUM) 40 MG capsule Take 40 mg by mouth daily. Over the counter extended release    hydrochlorothiazide (HYDRODIURIL) 25 MG tablet Take 12.5 mg by mouth daily.     lisinopril (PRINIVIL,ZESTRIL) 40 MG tablet Take 40 mg by mouth daily. Refills: 3          Today   CHIEF COMPLAINT:   Still with  weakness on the left side   VITAL SIGNS:  Blood pressure (!) 196/80, pulse 70, temperature 97.8 F (36.6 C), temperature source Oral, resp. rate 15, height 5' (1.524 m), weight 53.5 kg (117 lb 14.4 oz), SpO2 99 %.   REVIEW OF SYSTEMS:  Review of Systems  Constitutional: Negative.  Negative for chills, fever and malaise/fatigue.  HENT: Negative.  Negative for ear discharge,  ear pain, hearing loss, nosebleeds and sore throat.   Eyes: Negative.  Negative for blurred vision and pain.  Respiratory: Negative.  Negative for cough, hemoptysis, shortness of breath and wheezing.   Cardiovascular: Negative.  Negative for chest pain, palpitations and leg swelling.  Gastrointestinal: Negative.  Negative for abdominal pain, blood in stool, diarrhea, nausea and vomiting.  Genitourinary: Negative.  Negative for dysuria.  Musculoskeletal: Negative.  Negative for back pain.  Skin: Negative.   Neurological: Positive for sensory change and focal weakness. Negative for dizziness, tremors, speech change, seizures and headaches.  Endo/Heme/Allergies: Negative.  Does not bruise/bleed easily.  Psychiatric/Behavioral: Negative.  Negative for depression, hallucinations and suicidal ideas.     PHYSICAL EXAMINATION:  GENERAL:  83 y.o.-year-old patient lying in the bed with no acute distress.  NECK:  Supple, no jugular venous distention. No thyroid enlargement, no tenderness.  LUNGS: Normal breath sounds bilaterally, no wheezing, rales,rhonchi  No use of accessory muscles of respiration.  CARDIOVASCULAR: S1, S2 normal. No murmurs, rubs, or gallops.  ABDOMEN: Soft, non-tender, non-distended. Bowel sounds present. No organomegaly or mass.  EXTREMITIES: No pedal edema, cyanosis, or clubbing.  Decreased range of motion left shoulder PSYCHIATRIC: The patient is alert and oriented x 3.  SKIN: No obvious rash, lesion, or ulcer.  Neuro: Left facial droop, mild, left upper extremity 4-5 strength left lower extremity 4-5 strength. DATA REVIEW:   CBC Recent Labs  Lab 08/01/17 0504  WBC 5.7  HGB 11.1*  HCT 34.0*  PLT 216    Chemistries  Recent Labs  Lab 07/30/17 0513 08/01/17 0504  NA 128*  --   K 3.3*  --   CL 94*  --   CO2 25  --   GLUCOSE 93  --   BUN 15  --   CREATININE 0.72 0.77  CALCIUM 8.8*  --     Cardiac Enzymes No results for input(s): TROPONINI in the last 168  hours.  Microbiology Results  @[email or web address] @  RADIOLOGY:  Ct Angio Head W Or Wo Contrast  Result Date: 07/30/2017 CLINICAL DATA:  Left lower extremity weakness. Right corona radiata infarct on MRI. EXAM: CT ANGIOGRAPHY HEAD AND NECK TECHNIQUE: Multidetector CT imaging of the head and neck was performed using the standard protocol during bolus administration of intravenous contrast. Multiplanar CT image reconstructions and MIPs were obtained to evaluate the vascular anatomy. Carotid stenosis measurements (when applicable) are obtained utilizing NASCET criteria, using the distal internal carotid diameter as the denominator. CONTRAST:  75mL ISOVUE-370 IOPAMIDOL (ISOVUE-370) INJECTION 76% COMPARISON:  Head CT and MRI 07/29/2017. Carotid Doppler ultrasound 07/30/2017. Cervical spine CT 03/31/2016. FINDINGS: CT HEAD FINDINGS Brain: Focal hypodensity in the posterior right corona radiata corresponds to the acute infarct on MRI. There is no evidence of acute intracranial hemorrhage, mass, midline shift, or extra-axial fluid collection. The ventricles and sulci are normal for age. Chronic right basal ganglia region infarcts are noted. Patchy hypodensities throughout the cerebral white matter bilaterally are compatible with moderate chronic small vessel ischemic disease. Vascular: Calcified atherosclerosis at the skullbase. Skull: No fracture or focal osseous lesion. Sinuses: Evidence  of chronic left maxillary sinusitis with small volume complex polypoid soft tissue present. Clear mastoid air cells. Orbits: Bilateral cataract extraction. Review of the MIP images confirms the above findings CTA NECK FINDINGS Aortic arch: Standard 3 vessel aortic arch with mild atherosclerotic plaque. Widely patent arch vessel origins. Calcified plaque in the proximal right subclavian artery results in mild stenosis. Right carotid system: Tortuous proximal common carotid artery. Predominantly noncalcified plaque in the proximal ICA  results in 35% stenosis. Left carotid system: Widely patent common carotid artery. Mixed calcified and soft plaque in the proximal ICA results in 20% stenosis. Tortuous distal cervical ICA. Vertebral arteries: Patent with the left being strongly dominant. Moderate to severe right vertebral artery origin stenosis. No significant left vertebral stenosis. Skeleton: Moderately advanced cervical disc and facet degeneration. Other neck: 1.9 cm low-density left thyroid nodule, unchanged from the prior cervical spine CT. Upper chest: Clear lung apices. Review of the MIP images confirms the above findings CTA HEAD FINDINGS Anterior circulation: The internal carotid arteries are patent from skullbase to carotid termini with mild siphon atherosclerosis bilaterally. There is moderate right and mild left supraclinoid ICA stenosis. There is a 2 x 3 mm right supraclinoid ICA aneurysm in the posterior communicating region. ACAs and MCAs are patent with mild branch vessel irregularity but no evidence of proximal branch occlusion or significant proximal stenosis. Posterior circulation: The distal right vertebral artery is small and terminates in PICA. Focal atherosclerotic plaque in the proximal left V4 segment results in mild stenosis. The PICAs and SCAs are grossly patent bilaterally. The basilar artery is patent and tortuous with atherosclerotic irregularity resulting mild mid basilar stenosis. The PCAs are patent with a fetal origin noted on the left. There is mild right P1 stenosis an up to mild left posterior communicating artery narrowing. Mild-to-moderate PCA irregularity is present more distally bilaterally. No aneurysm. Venous sinuses: Patent. Anatomic variants: Fetal origin of the left PCA. Delayed phase: No abnormal enhancement. Review of the MIP images confirms the above findings IMPRESSION: 1. Cervical carotid artery atherosclerosis resulting in less than 50% proximal ICA stenosis bilaterally. 2. Moderate to severe  stenosis of the non-dominant proximal right vertebral artery. 3. Intracranial atherosclerosis without major branch occlusion. Moderate right and mild left stepped supraclinoid ICA stenosis. 4. Mild stenoses of the distal left vertebral artery, basilar artery, and proximal PCAs. 5. 3 mm right supraclinoid ICA aneurysm. Electronically Signed   By: Sebastian AcheAllen  Grady M.D.   On: 07/30/2017 14:38   Ct Angio Neck W Or Wo Contrast  Result Date: 07/30/2017 CLINICAL DATA:  Left lower extremity weakness. Right corona radiata infarct on MRI. EXAM: CT ANGIOGRAPHY HEAD AND NECK TECHNIQUE: Multidetector CT imaging of the head and neck was performed using the standard protocol during bolus administration of intravenous contrast. Multiplanar CT image reconstructions and MIPs were obtained to evaluate the vascular anatomy. Carotid stenosis measurements (when applicable) are obtained utilizing NASCET criteria, using the distal internal carotid diameter as the denominator. CONTRAST:  75mL ISOVUE-370 IOPAMIDOL (ISOVUE-370) INJECTION 76% COMPARISON:  Head CT and MRI 07/29/2017. Carotid Doppler ultrasound 07/30/2017. Cervical spine CT 03/31/2016. FINDINGS: CT HEAD FINDINGS Brain: Focal hypodensity in the posterior right corona radiata corresponds to the acute infarct on MRI. There is no evidence of acute intracranial hemorrhage, mass, midline shift, or extra-axial fluid collection. The ventricles and sulci are normal for age. Chronic right basal ganglia region infarcts are noted. Patchy hypodensities throughout the cerebral white matter bilaterally are compatible with moderate chronic small vessel ischemic disease. Vascular:  Calcified atherosclerosis at the skullbase. Skull: No fracture or focal osseous lesion. Sinuses: Evidence of chronic left maxillary sinusitis with small volume complex polypoid soft tissue present. Clear mastoid air cells. Orbits: Bilateral cataract extraction. Review of the MIP images confirms the above findings CTA  NECK FINDINGS Aortic arch: Standard 3 vessel aortic arch with mild atherosclerotic plaque. Widely patent arch vessel origins. Calcified plaque in the proximal right subclavian artery results in mild stenosis. Right carotid system: Tortuous proximal common carotid artery. Predominantly noncalcified plaque in the proximal ICA results in 35% stenosis. Left carotid system: Widely patent common carotid artery. Mixed calcified and soft plaque in the proximal ICA results in 20% stenosis. Tortuous distal cervical ICA. Vertebral arteries: Patent with the left being strongly dominant. Moderate to severe right vertebral artery origin stenosis. No significant left vertebral stenosis. Skeleton: Moderately advanced cervical disc and facet degeneration. Other neck: 1.9 cm low-density left thyroid nodule, unchanged from the prior cervical spine CT. Upper chest: Clear lung apices. Review of the MIP images confirms the above findings CTA HEAD FINDINGS Anterior circulation: The internal carotid arteries are patent from skullbase to carotid termini with mild siphon atherosclerosis bilaterally. There is moderate right and mild left supraclinoid ICA stenosis. There is a 2 x 3 mm right supraclinoid ICA aneurysm in the posterior communicating region. ACAs and MCAs are patent with mild branch vessel irregularity but no evidence of proximal branch occlusion or significant proximal stenosis. Posterior circulation: The distal right vertebral artery is small and terminates in PICA. Focal atherosclerotic plaque in the proximal left V4 segment results in mild stenosis. The PICAs and SCAs are grossly patent bilaterally. The basilar artery is patent and tortuous with atherosclerotic irregularity resulting mild mid basilar stenosis. The PCAs are patent with a fetal origin noted on the left. There is mild right P1 stenosis an up to mild left posterior communicating artery narrowing. Mild-to-moderate PCA irregularity is present more distally  bilaterally. No aneurysm. Venous sinuses: Patent. Anatomic variants: Fetal origin of the left PCA. Delayed phase: No abnormal enhancement. Review of the MIP images confirms the above findings IMPRESSION: 1. Cervical carotid artery atherosclerosis resulting in less than 50% proximal ICA stenosis bilaterally. 2. Moderate to severe stenosis of the non-dominant proximal right vertebral artery. 3. Intracranial atherosclerosis without major branch occlusion. Moderate right and mild left stepped supraclinoid ICA stenosis. 4. Mild stenoses of the distal left vertebral artery, basilar artery, and proximal PCAs. 5. 3 mm right supraclinoid ICA aneurysm. Electronically Signed   By: Sebastian AcheAllen  Grady M.D.   On: 07/30/2017 14:38   [place] Carotid Bilateral (at [facility] And [facility] Only)  Result Date: 07/30/2017 CLINICAL DATA:  Stroke.  History of hypertension. EXAM: BILATERAL CAROTID DUPLEX ULTRASOUND TECHNIQUE: Wallace CullensGray scale imaging, color Doppler and duplex ultrasound were performed of bilateral carotid and vertebral arteries in the neck. COMPARISON:  Brain MRI - 07/29/2017 FINDINGS: Criteria: Quantification of carotid stenosis is based on velocity parameters that correlate the residual internal carotid diameter with NASCET-based stenosis levels, using the diameter of the distal internal carotid lumen as the denominator for stenosis measurement. The following velocity measurements were obtained: RIGHT ICA:  2 or 9/49 cm/sec CCA:  100/8 cm/sec SYSTOLIC ICA/CCA RATIO:  21 DIASTOLIC ICA/CCA RATIO:  62 ECA:  129 cm/sec LEFT ICA:  131/27 cm/sec CCA:  137/9 cm/sec SYSTOLIC ICA/CCA RATIO:  1.0 DIASTOLIC ICA/CCA RATIO:  3.0 ECA:  111 cm/sec RIGHT CAROTID ARTERY: There is a minimal amount of atherosclerotic plaque involving the proximal aspect the right common carotid  artery (image 5). The right common carotid artery is noted to be tortuous (image 7). There is a minimal amount of atherosclerotic plaque involving the distal aspect the right common  carotid artery (image 16). There is a moderate to large amount of echogenic partially shadowing plaque within the right carotid bulb (image 21), extending to involve the origin and proximal aspects of the right internal carotid artery origin and proximal aspects of the right internal carotid artery (image 29), resulting in elevated peak systolic velocities throughout the right internal carotid artery. Greatest acquired peak systolic velocity within the proximal right internal carotid artery measures 209 cm/sec - image 32. RIGHT VERTEBRAL ARTERY:  Antegrade flow LEFT CAROTID ARTERY: There is a moderate amount of atherosclerotic plaque within the left carotid bulb (image 59), extending to involve the origin and proximal aspects of the left internal carotid artery (image 67), resulting in elevated peak systolic velocities within the mid aspect the left internal carotid artery. Greatest acquired peak systolic velocity within the left mid ICA measures 131 cm/sec - image 72. LEFT VERTEBRAL ARTERY:  Antegrade flow There is a potential minimal amount of nonocclusive wall thickening involving the proximal aspect the left internal jugular vein. IMPRESSION: Moderate to large amount of bilateral atherosclerotic plaque results in elevated peak systolic velocities within bilateral internal carotid arteries compatible with the 50-69% luminal narrowing range bilaterally, right greater than left. Further evaluation with CTA could performed as clinically indicated. Electronically Signed   By: Simonne Come M.D.   On: 07/30/2017 11:41      Current Discharge Medication List    START taking these medications   Details  aspirin EC 81 MG EC tablet Take 1 tablet (81 mg total) daily by mouth.    clopidogrel (PLAVIX) 75 MG tablet Take 1 tablet (75 mg total) daily by mouth.    rosuvastatin (CRESTOR) 20 MG tablet Take 1 tablet (20 mg total) at bedtime by mouth. Qty: 30 tablet, Refills: 11      CONTINUE these medications which  have CHANGED   Details  hydrALAZINE (APRESOLINE) 50 MG tablet Take 1 tablet (50 mg total) 3 (three) times daily by mouth.      CONTINUE these medications which have NOT CHANGED   Details  atenolol (TENORMIN) 100 MG tablet Take 1 tablet (100 mg total) by mouth daily. Qty: 30 tablet, Refills: 1    esomeprazole (NEXIUM) 40 MG capsule Take 40 mg by mouth daily. Over the counter extended release    hydrochlorothiazide (HYDRODIURIL) 25 MG tablet Take 12.5 mg by mouth daily.     lisinopril (PRINIVIL,ZESTRIL) 40 MG tablet Take 40 mg by mouth daily. Refills: 3          Management plans discussed with the patient and she is in agreement. Stable for discharge to inpatient rehabilitation  Patient should follow up with PCP  CODE STATUS:     Code Status Orders  (From admission, onward)        Start     Ordered   07/29/17 1821  Do not attempt resuscitation (DNR)  Continuous    Question Answer Comment  In the event of cardiac or respiratory ARREST Do not call a "code blue"   In the event of cardiac or respiratory ARREST Do not perform Intubation, CPR, defibrillation or ACLS   In the event of cardiac or respiratory ARREST Use medication by any route, position, wound care, and other measures to relive pain and suffering. May use oxygen, suction and manual treatment of airway  obstruction as needed for comfort.      07/29/17 1820    Code Status History    Date Active Date Inactive Code Status Order ID Comments User Context   03/31/2016 15:23 04/03/2016 17:50 Full Code [id number]  Hower, Cletis [place], MD ED    Advance Directive Documentation     Most Recent Value  Type of Advance Directive  Out of facility DNR (pink MOST or yellow form), Living will, Healthcare Power of Attorney  Pre-existing out of facility DNR order (yellow form or pink MOST form)  Physician notified to receive inpatient order [facility] order in place.]  "MOST" Form in Place?  No data      TOTAL TIME TAKING CARE OF THIS  PATIENT: 38 minutes.    Note: This dictation was prepared with Dragon dictation along with smaller phrase technology. Any transcriptional errors that result from this process are unintentional.  Jetta Murray M.D on 08/01/2017 at 9:36 AM  Between 7am to 6pm - Pager - [phone] After 6pm go to [email or web address] - Social research officer, government  [facility]  Office  [phone]  CC: Primary care physician; Kandyce Rud, MD

## 2017-08-01 NOTE — Progress Notes (Signed)
Per chart patient is going to CIR today. Please reconsult if future social work needs arise. CSW signing off.   Baker Hughes IncorporatedBailey Daniqua Campoy, LCSW [phone]

## 2017-08-02 ENCOUNTER — Inpatient Hospital Stay (HOSPITAL_COMMUNITY): Payer: Medicare Other | Admitting: Speech Pathology

## 2017-08-02 ENCOUNTER — Inpatient Hospital Stay (HOSPITAL_COMMUNITY): Payer: Medicare Other

## 2017-08-02 ENCOUNTER — Inpatient Hospital Stay (HOSPITAL_COMMUNITY): Payer: Medicare Other | Admitting: Physical Therapy

## 2017-08-02 ENCOUNTER — Encounter (HOSPITAL_COMMUNITY): Payer: Self-pay | Admitting: *Deleted

## 2017-08-02 DIAGNOSIS — R0989 Other specified symptoms and signs involving the circulatory and respiratory systems: Secondary | ICD-10-CM

## 2017-08-02 DIAGNOSIS — I169 Hypertensive crisis, unspecified: Secondary | ICD-10-CM

## 2017-08-02 DIAGNOSIS — D62 Acute posthemorrhagic anemia: Secondary | ICD-10-CM

## 2017-08-02 DIAGNOSIS — E8809 Other disorders of plasma-protein metabolism, not elsewhere classified: Secondary | ICD-10-CM

## 2017-08-02 DIAGNOSIS — I10 Essential (primary) hypertension: Secondary | ICD-10-CM

## 2017-08-02 DIAGNOSIS — R7303 Prediabetes: Secondary | ICD-10-CM

## 2017-08-02 DIAGNOSIS — E871 Hypo-osmolality and hyponatremia: Secondary | ICD-10-CM

## 2017-08-02 DIAGNOSIS — E46 Unspecified protein-calorie malnutrition: Secondary | ICD-10-CM

## 2017-08-02 LAB — CBC WITH DIFFERENTIAL/PLATELET
BASOS ABS: 0 10*3/uL (ref 0.0–0.1)
BASOS PCT: 1 %
Eosinophils Absolute: 0.2 10*3/uL (ref 0.0–0.7)
Eosinophils Relative: 3 %
HEMATOCRIT: 32.2 % — AB (ref 36.0–46.0)
HEMOGLOBIN: 10.5 g/dL — AB (ref 12.0–15.0)
Lymphocytes Relative: 26 %
Lymphs Abs: 1.6 10*3/uL (ref 0.7–4.0)
MCH: 27.1 pg (ref 26.0–34.0)
MCHC: 32.6 g/dL (ref 30.0–36.0)
MCV: 83.2 fL (ref 78.0–100.0)
MONOS PCT: 13 %
Monocytes Absolute: 0.8 10*3/uL (ref 0.1–1.0)
NEUTROS ABS: 3.6 10*3/uL (ref 1.7–7.7)
NEUTROS PCT: 57 %
Platelets: 219 10*3/uL (ref 150–400)
RBC: 3.87 MIL/uL (ref 3.87–5.11)
RDW: 14.1 % (ref 11.5–15.5)
WBC: 6.3 10*3/uL (ref 4.0–10.5)

## 2017-08-02 LAB — COMPREHENSIVE METABOLIC PANEL
ALBUMIN: 3.1 g/dL — AB (ref 3.5–5.0)
ALK PHOS: 73 U/L (ref 38–126)
ALT: 25 U/L (ref 14–54)
AST: 33 U/L (ref 15–41)
Anion gap: 8 (ref 5–15)
BILIRUBIN TOTAL: 0.4 mg/dL (ref 0.3–1.2)
BUN: 17 mg/dL (ref 6–20)
CALCIUM: 9.1 mg/dL (ref 8.9–10.3)
CO2: 25 mmol/L (ref 22–32)
CREATININE: 0.71 mg/dL (ref 0.44–1.00)
Chloride: 98 mmol/L — ABNORMAL LOW (ref 101–111)
GFR calc non Af Amer: >60 mL/min (ref >60)
Glucose, Bld: 99 mg/dL (ref 65–99)
Potassium: 4.4 mmol/L (ref 3.5–5.1)
SODIUM: 131 mmol/L — AB (ref 135–145)
Total Protein: 5 g/dL — ABNORMAL LOW (ref 6.5–8.1)

## 2017-08-02 MED ORDER — PRO-STAT SUGAR FREE PO LIQD
30.0000 mL | Freq: Two times a day (BID) | ORAL | Status: DC
  Administered 2017-08-02 – 2017-08-11 (×18): 30 mL via ORAL
  Filled 2017-08-02 (×21): qty 30

## 2017-08-02 NOTE — Evaluation (Addendum)
Occupational Therapy Assessment and Plan  Patient Details  Name: Debbie Hampton MRN: [id number] Date of Birth: [birth year]  OT Diagnosis: cognitive deficits, hemiplegia affecting dominant side, muscle weakness (generalized) and coordination disorder Rehab Potential:   ELOS: 7-10   Today's Date: 08/02/2017 OT Individual Time: 3300-7622 OT Individual Time Calculation (min): 75 min     Problem List:  Patient Active Problem List   Diagnosis Date Noted  . 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 Acute CVA (cerebrovascular accident) ([place]) 07/29/2017  . Syncope, near 03/31/2016    Past Medical History:  Past Medical History:  Diagnosis Date  . Bilateral shoulder pain   . Colon polyp   . Constipation   . GERD (gastroesophageal reflux disease)   . Hypertension   . Impaired fasting glucose   . Syncope 03/2016   with CHI  . Varicose veins of both lower extremities    Past Surgical History:  Past Surgical History:  Procedure Laterality Date  . TONSILLECTOMY    . TUMOR REMOVAL     ovaries    Assessment & Plan Clinical Impression:   Debbie Hampton is an 83 years old female with history of HTN, GERD who was admitted on 07/29/17 with reports of multiple falls for 2-3 days PTA, left sided weakness and hypertensive emergency--SBP 260 pr reports.MRI brain done revealing 1.5 cm infarct in deep right periventricular white matter. 2D echo showed EF 60-65% with mild MVR, mild to moderate left atrial dilatation, and moderate pulmonary HTN. Carotid dopplers revealed moderate to large amount of atherosclerotic plaque with R >L 50-69% ICA stenosis. 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 Neurology recommended continuing ASA/Plavix for secondary stroke prevention and repeat follow up imaging to monitor aneurysm in 6- 12 months.   Therapy ongoing and patient with limitations due to  left sided weakness with balance deficits, LLE ataxia, tachycardia with activity and poor attention affecting ability to carry out ADLs and mobility  Patient currently requires min with IADL secondary to muscle weakness, decreased cardiorespiratoy endurance, decreased coordination, decreased attention, decreased safety awareness and decreased memory and decreased standing balance, hemiplegia and decreased balance strategies.  Prior to hospitalization, patient could complete BADL/IADL with modified independent .  Patient will benefit from skilled intervention to increase independence with basic self-care skills and increase level of independence with iADL prior to discharge home with care partner.  Anticipate patient will require 24 hour supervision and follow up home health.  OT - End of Session Activity Tolerance: Tolerates 10 - 20 min activity with multiple rests Endurance Deficit: Yes OT Assessment Rehab Potential (ACUTE ONLY): Excellent OT Patient demonstrates impairments in the following area(s): Balance;Cognition;Endurance;Motor;Safety;Sensory OT Basic ADL's Functional Problem(s): Grooming;Bathing;Dressing;Toileting OT Advanced ADL's Functional Problem(s): Simple Meal Preparation OT Transfers Functional Problem(s): Toilet;Tub/Shower OT Plan OT Intensity: Minimum of 1-2 x/day, 45 to 90 minutes OT Frequency: 5 out of 7 days OT Duration/Estimated Length of Stay: 7-10 OT Treatment/Interventions: Balance/vestibular training;Discharge planning;Pain management;Therapeutic Activities;UE/LE Coordination activities;Self Care/advanced ADL retraining;Cognitive remediation/compensation;Functional mobility training;Patient/family education;Therapeutic Exercise;Visual/perceptual remediation/compensation;Community reintegration;DME/adaptive equipment instruction;Neuromuscular re-education;Psychosocial support;UE/LE Strength taining/ROM OT Self Feeding Anticipated Outcome(s): MOD I OT Basic Self-Care  Anticipated Outcome(s): MOD I OT Toileting Anticipated Outcome(s):  MOD I toilet; S shower OT Bathroom Transfers Anticipated Outcome(s): MOD I toilet; S shower OT Recommendation Patient destination: Home Follow Up Recommendations: 24 hour supervision/assistance Equipment Recommended: Tub/shower seat;To be determined   Skilled Therapeutic Intervention 1:1. Pt educated on  OT role/purose, ELOS, CIR, and POC. Pt vitals assess BP 186/55, HR 70 and O2 100%. RN alerted to BP and administered medication. Pt with no c/o of pain. Throughout session pt often talks in tangents but eventually able to direct back to question with MIN cueing. Pt stand pivot transfer with lifting A with RW from recliner>w/c. All other transfers at min A level with Vc for safety awareness and sequencing. Pt able to complete toileting with MIN A standing balance while holding onto grab bar. Pt bathes at sit to stand level with min A for balance while washing buttocks and min VC for sequencing bathing body parts. Pt dons button up shirt with A to pull shirt around back and pt able to fasten buttons with increased time 2/2 decreased sensation/FMC in LUE. Pt able to notice she donnned underwear backwards and corrects error. Pt able to don underwear and pants with CGA for balance. Pt stands to brush teeth with forced use of LUE. Exited session with pt seated in recliner with call light in reach and all needs met.  OT Evaluation Precautions/Restrictions  Precautions Precautions: Fall Restrictions Weight Bearing Restrictions: No General Chart Reviewed: Yes Vital Signs Therapy Vitals Pulse Rate: 61 BP: (!) 186/55 Patient Position (if appropriate): Lying Pain Pain Assessment Pain Assessment: No/denies pain Home Living/Prior Functioning Home Living Family/patient expects to be discharged to:: Private residence Living Arrangements: Spouse/significant other Available Help at Discharge: Available 24 hours/day Home Access: Stairs  to enter [employer] of Steps: 4 Entrance Stairs-Rails: Right, Left Home Layout: One level Bathroom Shower/Tub: Gaffer, Door [employer] Toilet: Handicapped height Bathroom Accessibility: Yes  Lives With: Spouse IADL History Mode of Transportation: Car Prior Function Comments: Indep with ADLs, household and community mobility without assist device; + driving; caregiver 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 No falls other than 2 just prior to admission in past 12 months.(planning on moving husband to facility per pt report) ADL   Vision Baseline Vision/History: Wears glasses Wears Glasses: Reading only Patient Visual Report: No change from baseline Vision Assessment?: No apparent visual deficits Perception  Perception: Within Functional Limits Praxis Praxis: Intact Cognition Overall Cognitive Status: Impaired/Different from baseline Year: 2019 Month: November Day of Week: Correct Immediate Memory Recall: Blue;Sock;Bed Memory Recall: Bed;Blue Memory Recall Blue: Without Cue Memory Recall Bed: Without Cue Attention: Selective Selective Attention: Impaired(requires redirection during tangential conversation) Sensation Sensation Light Touch: Impaired by gross assessment(reports tingling in fingers from carpal tunnel) Motor  Motor Motor: Hemiplegia Mobility  Transfers Transfers: Sit to Stand;Stand to Sit Sit to Stand: 3: Mod assist Stand to Sit: 4: Min assist  Trunk/Postural Assessment  Cervical Assessment Cervical Assessment: Within Functional Limits Thoracic Assessment Thoracic Assessment: Exceptions to WFL(kyphotic) Lumbar Assessment Lumbar Assessment: Exceptions to WFL(post pelvic tilt) Postural Control Postural Control: Deficits on evaluation(delayed)  Balance Balance Balance Assessed: Yes Dynamic Sitting Balance Sitting balance - Comments: posterior trunk lean, limited righting  reactions requiring min guard at times  Extremity/Trunk Assessment RUE Assessment RUE Assessment: Exceptions to WFL(generlized weakness, reports rotator cuff tear and bruise) LUE Assessment LUE Assessment: Exceptions to WFL(generlized weakness)   See Function Navigator for Current Functional Status.   Refer to Care Plan for Long Term Goals  Recommendations for other services: Therapeutic Recreation  Pet therapy, Kitchen group and Outing/community reintegration   Discharge Criteria: Patient will be discharged from OT if patient refuses treatment 3 consecutive times without medical reason, if treatment goals not met, if there is  a change in medical status, if patient makes no progress towards goals or if patient is discharged from hospital.  The above assessment, treatment plan, treatment alternatives and goals were discussed and mutually agreed upon: by patient  Tonny Branch 08/02/2017, 9:18 AM

## 2017-08-02 NOTE — Evaluation (Signed)
Speech Language Pathology Assessment and Plan  Patient Details  Name: Debbie Hampton MRN: [id number] Date of Birth: 12/21/1931  SLP Diagnosis: Cognitive Impairments  Rehab Potential: Good ELOS: 7-10 days     Today's Date: 08/02/2017 SLP Individual Time: 1430-1530 SLP Individual Time Calculation (min): 60 min   Problem List:  Patient Active Problem List   Diagnosis Date Noted  . 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 Acute CVA (cerebrovascular accident) ([place]) 07/29/2017  . Syncope, near 03/31/2016   Past Medical History:  Past Medical History:  Diagnosis Date  . Bilateral shoulder pain   . Colon polyp   . Constipation   . GERD (gastroesophageal reflux disease)   . Hypertension   . Impaired fasting glucose   . Syncope 03/2016   with [facility]  . Varicose veins of both lower extremities    Past Surgical History:  Past Surgical History:  Procedure Laterality Date  . TONSILLECTOMY    . TUMOR REMOVAL     ovaries    Assessment / Plan / Recommendation Clinical Impression   Debbie Hampton is an 83 years old female with history of HTN who was admitted on 07/29/17 with reports of multiple falls for 2-3 days PTA, left sided weakness and hypertensive emergency--SBP 260 pr reports.  MRI brain done revealing 1.5 cm infarct in deep right periventricular white matter. Therapy ongoing and patient with limitations due to left sided weakness with balance deficits, LLE ataxia, tachycardia with activity and poor attention affecting ability to carry out ADLs and mobility. SLP evaluation completed on 08/02/2017 with the following results:   Pt presents with mild higher level cognitive deficits impacting anticipatory awareness, executive functioning, selective attention, and recall of new information.  Pt was independent and the primary caregiver for her husband with dementia prior to admission and she wishes to return home to live independently after CIR.  As a result, pt would benefit from skilled ST  while inpatient in order to maximize functional independence and reduce burden of care prior to discharge.    Skilled Therapeutic Interventions          Pt scored 23/30 on the [employer] standardized cognitive assessment (N >/26) with deficits most notable for delayed recall of information, naming fluency, and executive functioning subtests.  Pt was very verbose throughout session and appeared internally distracted by social stressors, needing mod assist verbal cues for redirection.  Furthermore, pt had an overall anxious affect.  Pt endorses having a personal and family history of anxiety which exacerbates if not leads to her hypertension.  Pt also endorses that her anxiety has been worse over the last year after her husband was diagnosed with dementia and she took over as primary caregiver.  SLP provided skilled education regarding neuropsych services for coping and adjustment and pt was in agreement.  All questions were answered to her satisfaction at this time.  Pt left in recliner wtih call bell within reach.    SLP Assessment  Patient will need skilled [facility] Services during CIR admission    Recommendations  Recommendations for Other Services: Neuropsych consult Patient destination: Home Follow up Recommendations: Other (comment)(TBD) Equipment Recommended: None recommended by SLP    SLP Frequency 3 to 5 out of 7 days   SLP Duration  SLP Intensity  SLP Treatment/Interventions 7-10 days   Minumum of 1-2 x/day, 30 to 90 minutes  Cognitive remediation/compensation;Cueing hierarchy;Functional tasks;Patient/family education    Pain Pain Assessment Pain Assessment: No/denies pain Pain Score: 0-No pain  Prior Functioning  Cognitive/Linguistic Baseline: Within functional limits Type of Home: House  Lives With: Spouse Available Help at Discharge: Available 24 hours/day Vocation: Retired  Function:  Eating Eating                Cognition Comprehension  Comprehension assist level: Follows complex conversation/direction with extra time/assistive device  Expression   Expression assist level: Expresses complex ideas: With extra time/assistive device  Social Interaction Social Interaction assist level: Interacts appropriately with others with medication or extra time (anti-anxiety, antidepressant).  Problem Solving Problem solving assist level: Solves basic 90% of the time/requires cueing < 10% of the time  Memory Memory assist level: Recognizes or recalls 90% of the time/requires cueing < 10% of the time   Short Term Goals: Week 1: SLP Short Term Goal 1 (Week 1): STG=LTG due to ELOS   Refer to Care Plan for Long Term Goals  Recommendations for other services: Neuropsych  Discharge Criteria: Patient will be discharged from SLP if patient refuses treatment 3 consecutive times without medical reason, if treatment goals not met, if there is a change in medical status, if patient makes no progress towards goals or if patient is discharged from hospital.  The above assessment, treatment plan, treatment alternatives and goals were discussed and mutually agreed upon: by patient  Emilio Math 08/02/2017, 4:33 PM

## 2017-08-02 NOTE — Progress Notes (Signed)
Pt BP 224/75 at 0251; admin PRN dose of hydralazine at 0259 ; will continue to monitor and will reassess BP in 1 hour  Netanel Yannuzzi Cottie Banda Amberia Bayless, RN

## 2017-08-02 NOTE — Evaluation (Signed)
Physical Therapy Assessment and Plan  Patient Details  Name: Debbie Hampton MRN: [id number] Date of Birth: [birth year]  PT Diagnosis: Ataxia, Ataxic gait, Hemiparesis dominant, Impaired sensation and Muscle weakness Rehab Potential: Good ELOS: 7 to 10 days   Today's Date: 08/02/2017 PT Individual Time: 1300-1400 PT Individual Time Calculation (min): 60 min    Problem List:  Patient Active Problem List   Diagnosis Date Noted  . 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 Acute CVA (cerebrovascular accident) ([place]) 07/29/2017  . Syncope, near 03/31/2016    Past Medical History:  Past Medical History:  Diagnosis Date  . Bilateral shoulder pain   . Colon polyp   . Constipation   . GERD (gastroesophageal reflux disease)   . Hypertension   . Impaired fasting glucose   . Syncope 03/2016   with [facility]I  . Varicose veins of both lower extremities    Past Surgical History:  Past Surgical History:  Procedure Laterality Date  . TONSILLECTOMY    . TUMOR REMOVAL     ovaries    Assessment & Plan Clinical Impression: Patient is an 83 years old female with history of HTN, GERD who was admitted on 07/29/17 with reports of multiple falls for 2-3 days PTA, left sided weakness and hypertensive emergency--SBP 260 pr reports.MRI brain done revealing 1.5 cm infarct in deep right periventricular white matter. 2D echo showed EF 60-65% with mild MVR, mild to moderate left atrial dilatation, and moderate pulmonary HTN. Carotid dopplers revealed moderate to large amount of atherosclerotic plaque with R >L 50-69% ICA stenosis. 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 Neurology recommended continuing ASA/Plavix for secondary stroke prevention and repeat follow up imaging to monitor aneurysm in 6- 12 months.   Therapy ongoing and patient with limitations due to left sided weakness with balance  deficits, LLE ataxia, tachycardia with activity and poor attention affecting ability to carry out ADLs and mobility.  Patient transferred to CIR on 08/01/2017 .   Patient currently requires mod with mobility secondary to muscle weakness, decreased cardiorespiratoy endurance, ataxia and decreased coordination and decreased attention.  Prior to hospitalization, patient was independent  with mobility and lived with Spouse in a House home.  Home access is 4Stairs to enter.  Patient will benefit from skilled PT intervention to maximize safe functional mobility, minimize fall risk and decrease caregiver burden for planned discharge home with 24 hour supervision.  Anticipate patient will benefit from follow up [place] at discharge.  PT - End of Session Activity Tolerance: Tolerates 30+ min activity with multiple rests Endurance Deficit: Yes PT Assessment Rehab Potential (ACUTE/IP ONLY): Good PT Barriers to Discharge: Inaccessible home environment PT Patient demonstrates impairments in the following area(s): Balance;Endurance;Motor;Sensory PT Transfers Functional Problem(s): Bed Mobility;Bed to Chair;Car PT Locomotion Functional Problem(s): Stairs;Wheelchair Mobility;Ambulation PT Plan PT Intensity: Minimum of 1-2 x/day ,45 to 90 minutes PT Frequency: 5 out of 7 days PT Duration Estimated Length of Stay: 7 to 10 days PT Treatment/Interventions: Ambulation/gait training;Balance/vestibular training;Functional mobility training;DME/adaptive equipment instruction;Neuromuscular re-education;Patient/family education;Therapeutic Exercise;Visual/perceptual remediation/compensation;UE/LE Coordination activities;Therapeutic Activities;UE/LE Strength taining/ROM;Wheelchair propulsion/positioning;Stair training PT Transfers Anticipated Outcome(s): mod I transfers PT Locomotion Anticipated Outcome(s): mod I gait, S staris, w/c mobility mod I PT Recommendation Follow Up Recommendations: Home health PT Patient  destination: Home Equipment Recommended: To be determined  Skilled Therapeutic Intervention PT evaluation completed and treatment plan initiated. Pt performed multiple transfers with rolling walker and min to  mod A depending on surface height and level of fatigue. Pt ambulated 25 feet x 2 and 10 feet x 1 with rolling walker and min A with verbal cues. Pt returned to room following treatment. Pt left sitting up in bedside recliner with call bell within reach.  PT Evaluation Precautions/Restrictions Precautions Precautions: Fall Restrictions Weight Bearing Restrictions: No General Chart Reviewed: Yes Family/Caregiver Present: No  Pain Pain Assessment Pain Score: 0-No pain Home Living/Prior Functioning Home Living Available Help at Discharge: Available 24 hours/day Type of Home: House Home Access: Stairs to enter [employer] of Steps: 4 Entrance Stairs-Rails: Right;Left;Can reach both Home Layout: One level  Lives With: Spouse Prior Function Level of Independence: Independent with transfers;Independent with gait  Able to Take Stairs?: Yes Driving: Yes Comments: Indep with ADLs, household and community mobility without assist device; + driving; caregiver 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 No falls other than 2 just prior to admission in past 12 months. Vision/Perception  Perception Perception: Within Functional Limits Praxis Praxis: Intact  Cognition Overall Cognitive Status: Impaired/Different from baseline Arousal/Alertness: Awake/alert Attention: Selective Selective Attention: Impaired Memory: Impaired Memory Impairment: Decreased long term memory Awareness: Appears intact Safety/Judgment: Appears intact Sensation Sensation Light Touch: Impaired by gross assessment(tingling B fingers secondary to carpel tunnel) Coordination Gross Motor Movements are Fluid and Coordinated: No Motor   Motor Motor: Ataxia(hemiparesis)  Mobility Bed Mobility Bed Mobility: Rolling Right;Supine to Sit;Sit to Supine Rolling Right: 5: Supervision Supine to Sit: 5: Supervision Sit to Supine: 5: Supervision Transfers Transfers: Yes Sit to Stand: 4: Min assist Stand to Sit: 4: Min assist Stand Pivot Transfers: 4: Min assist;3: Mod assist Locomotion  Ambulation Ambulation: Yes Ambulation/Gait Assistance: 3: Mod assist Ambulation Distance (Feet): 50 Feet Assistive device: 1 person hand held assist Stairs / Additional Locomotion Stairs: Yes Stairs Assistance: 3: Mod assist;4: Min assist Stair Management Technique: Two rails Number of Stairs: 4 Height of Stairs: 6 Wheelchair Mobility Wheelchair Mobility: Yes Wheelchair Assistance: 4: Min Lexicographer: Both upper extremities Distance: 10  Trunk/Postural Assessment  Cervical Assessment Cervical Assessment: Within Water engineer Thoracic Assessment: Exceptions to [facility] Lumbar Assessment Lumbar Assessment: Exceptions to [facility] Postural Control Postural Control: Deficits on evaluation  Balance Balance Balance Assessed: Yes Static Sitting Balance Static Sitting - Balance Support: Feet supported Static Sitting - Level of Assistance: 5: Stand by assistance Static Standing Balance Static Standing - Balance Support: During functional activity Static Standing - Level of Assistance: 4: Min assist Dynamic Standing Balance Dynamic Standing - Balance Support: During functional activity Dynamic Standing - Level of Assistance: 3: Mod assist Extremity Assessment B UEs as per OT evaluation.  RLE Assessment RLE Assessment: Within Functional Limits LLE Assessment LLE Assessment: Exceptions to WFL LLE AROM (degrees) Overall AROM Left Lower Extremity: Within functional limits for tasks assessed LLE Strength LLE Overall Strength: Deficits LLE Overall Strength Comments: grossly 3-/5    See Function  Navigator for Current Functional Status.   Refer to Care Plan for Long Term Goals  Recommendations for other services: None   Discharge Criteria: Patient will be discharged from PT if patient refuses treatment 3 consecutive times without medical reason, if treatment goals not met, if there is a change in medical status, if patient makes no progress towards goals or if patient is discharged from hospital.  The above assessment, treatment plan, treatment alternatives and goals were discussed and mutually agreed upon: by patient  Dub Amis 08/02/2017, 2:58  PM  

## 2017-08-02 NOTE — Progress Notes (Signed)
PHYSICAL MEDICINE & REHABILITATION     PROGRESS NOTE  Subjective/Complaints:  Pt seen laying in bed this AM.  She slept well overnight until early this AM.  She states she is tired of being in bed and is ready to begin therapies.  She also asks to remove eggs from her allergy list because she eats eggs as one of her staples and has never had a reaction.   ROS: Denies CP, SOB, N/V/D.  Objective: Vital Signs: Blood pressure (!) 175/60, pulse 60, temperature 97.9 F (36.6 C), temperature source Oral, resp. rate 18, SpO2 98 %. No results found. Recent Labs    08/01/17 0504 08/02/17 0542  WBC 5.7 6.3  HGB 11.1* 10.5*  HCT 34.0* 32.2*  PLT 216 219   Recent Labs    08/01/17 0504 08/02/17 0542  NA  --  131*  K  --  4.4  CL  --  98*  GLUCOSE  --  99  BUN  --  17  CREATININE 0.77 0.71  CALCIUM  --  9.1   CBG (last 3)  No results for input(s): GLUCAP in the last 72 hours.  Wt Readings from Last 3 Encounters:  07/29/17 53.5 kg (117 lb 14.4 oz)  03/31/16 60.8 kg (134 lb)    Physical Exam:  BP (!) 175/60 (BP Location: Left Arm)   Pulse 60   Temp 97.9 F (36.6 C) (Oral)   Resp 18   SpO2 98%  Constitutional: She appearswell-developedand well-nourished.No distress.  HENT: Normocephalicand atraumatic.  Respiratory:Effort normaland breath sounds normal.  GI: Bowel sounds are normal. She exhibitsno distension.  Musculoskeletal: She exhibits noedemaor tenderness. Frozen left shoulder due to prior injury. Neurological: She isalertand oriented. Mild left facial weakness.  Speech clear but tangential needing redirection.  Able to follow simple motor commands.  Mild LUE > LLE weakness. Skin: Skin iswarmand dry. She isnot diaphoretic.  Psychiatric: Herbehavior is normal.Thought contentnormal. Her mood appears anxious. Her speech istangential.   Assessment/Plan: 1. Functional deficits secondary to right periventricular white matter infarct  which require 3+ hours per day of interdisciplinary therapy in a comprehensive inpatient rehab setting. Physiatrist is providing close team supervision and 24 hour management of active medical problems listed below. Physiatrist and rehab team continue to assess barriers to discharge/monitor patient progress toward functional and medical goals.  Function:  Bathing Bathing position      Bathing parts Body parts bathed by patient: Right arm, Left arm, Chest, Abdomen, Front perineal area, Buttocks, Right upper leg, Left upper leg, Right lower leg, Left lower leg Body parts bathed by helper: Back  Bathing assist Assist Level: Touching or steadying assistance(Pt > 75%)      Upper Body Dressing/Undressing Upper body dressing   What is the patient wearing?: Button up shirt         Button up shirt - Perfomed by patient: Thread/unthread right sleeve, Thread/unthread left sleeve, Button/unbutton shirt Button up shirt - Perfomed by helper: Pull shirt around back    Upper body assist Assist Level: Touching or steadying assistance(Pt > 75%)      Lower Body Dressing/Undressing Lower body dressing   What is the patient wearing?: Underwear, Pants Underwear - Performed by patient: Thread/unthread right underwear leg, Thread/unthread left underwear leg, Pull underwear up/down   Pants- Performed by patient: Thread/unthread right pants leg, Thread/unthread left pants leg, Pull pants up/down  Lower body assist        Toileting Toileting   Toileting steps completed by patient: Adjust clothing prior to toileting, Performs perineal hygiene, Adjust clothing after toileting      Toileting assist Assist level: Touching or steadying assistance (Pt.75%)   Transfers Chair/bed transfer   Chair/bed transfer method: Stand pivot Chair/bed transfer assist level: Moderate assist (Pt 50 - 74%/lift or lower)       Locomotion Ambulation     Max distance: 50 Assist  level: Moderate assist (Pt 50 - 74%)   Wheelchair   Type: Manual Max wheelchair distance: 10 Assist Level: Touching or steadying assistance (Pt > 75%)  Cognition Comprehension Comprehension assist level: Follows complex conversation/direction with extra time/assistive device  Expression Expression assist level: Expresses complex ideas: With extra time/assistive device  Social Interaction Social Interaction assist level: Interacts appropriately with others with medication or extra time (anti-anxiety, antidepressant).  Problem Solving Problem solving assist level: Solves basic 90% of the time/requires cueing < 10% of the time  Memory Memory assist level: Recognizes or recalls 90% of the time/requires cueing < 10% of the time    Medical Problem List and Plan: 1.Left hemiparesis and functional deficitssecondary to right periventricular white matter infarct   Begin CIR   Notes reviewed, images reviewed.  2. DVT Prophylaxis/Anticoagulation: Pharmaceutical:Lovenox 3. Pain Management:tylenol prn with local Mesures for chronic pain. 4. Mood:LCSW to follow for evaluation and support. 5. Neuropsych: This patientis not fullycapable of making decisions on herown behalf. 6. Skin/Wound Care:Routine pressure relief measures. Maintain adequate nutritional and hydration status. 7. Fluids/Electrolytes/Nutrition:Monitor I/O.  8.LabileHTN:Poorly controlled per records, been seen by nephrology --negative renal work up.Monitorqid for now--continue lisinopril, hydralazine and tenormin. Off HCTZ at this time.Avoid hypotension.   Monitor with increased mobility, hypertensive crisis overnight 9. GERD:Continue Protonix. 10. Hyponatremia:Chronic due to HCTZ?    Na+ 131 on 11/17   Cont to monitor 11. Prediabetic: Hgb A1c- 5.9.   WNL on 11/17 12. Hypoalbuminemia   Supplement initiated 11/17 13. ABLA   Hb 10.5 on 11/17   Cont to monitor  LOS (Days) 1 A FACE TO FACE EVALUATION WAS  PERFORMED  Pasqualino Witherspoon Karis Jubanil Dakari Stabler 08/02/2017 5:26 PM

## 2017-08-03 ENCOUNTER — Inpatient Hospital Stay (HOSPITAL_COMMUNITY): Payer: Medicare Other

## 2017-08-03 ENCOUNTER — Encounter (HOSPITAL_COMMUNITY): Payer: Self-pay

## 2017-08-03 MED ORDER — CHLORTHALIDONE 25 MG PO TABS
25.0000 mg | ORAL_TABLET | Freq: Every day | ORAL | Status: DC
  Administered 2017-08-03 – 2017-08-06 (×4): 25 mg via ORAL
  Filled 2017-08-03 (×4): qty 1

## 2017-08-03 NOTE — Progress Notes (Signed)
[facility] PHYSICAL MEDICINE & REHABILITATION     PROGRESS NOTE  Subjective/Complaints:  Well overnight. She states she had a good first in therapies yesterday, but it was tiring. She is extremely verbose. She tells me about her son reminds me of our conversation regarding eggs yesterday, reviews the number and timing of her therapies are today and tomorrow, etc.  ROS: Denies CP, SOB, N/V/D.  Objective: Vital Signs: Blood pressure (!) 171/53, pulse 61, temperature 98.3 F (36.8 C), temperature source Oral, resp. rate 18, SpO2 97 %. No results found. Recent Labs    08/01/17 0504 08/02/17 0542  WBC 5.7 6.3  HGB 11.1* 10.5*  HCT 34.0* 32.2*  PLT 216 219   Recent Labs    08/01/17 0504 08/02/17 0542  NA  --  131*  K  --  4.4  CL  --  98*  GLUCOSE  --  99  BUN  --  17  CREATININE 0.77 0.71  CALCIUM  --  9.1   CBG (last 3)  No results for input(s): GLUCAP in the last 72 hours.  Wt Readings from Last 3 Encounters:  07/29/17 53.5 kg (117 lb 14.4 oz)  03/31/16 60.8 kg (134 lb)    Physical Exam:  BP (!) 171/53 (BP Location: Right Arm) Comment: reviewed  Pulse 61   Temp 98.3 F (36.8 C) (Oral)   Resp 18   SpO2 97%  Constitutional: She appearswell-developedand well-nourished.No distress.  HENT: Normocephalicand atraumatic.  Cardiac: RRR. No JVD. Respiratory:Effort normal breath sounds normal.  GI: Bowel sounds are normal. She exhibitsno distension.  Musculoskeletal: She exhibits noedemaor tenderness. Frozen left shoulder due to prior injury. Neurological: She isalertand oriented. Mild left facial weakness.  Speech clear but tangential needing redirection.  Able to follow simple motor commands.  Mild LUE > LLE weakness. Skin: Skin iswarmand dry. She isnot diaphoretic.  Psychiatric: Her mood appears anxious. Her speech istangential.Verbose.   Assessment/Plan: 1. Functional deficits secondary to right periventricular white matter infarct which  require 3+ hours per day of interdisciplinary therapy in a comprehensive inpatient rehab setting. Physiatrist is providing close team supervision and 24 hour management of active medical problems listed below. Physiatrist and rehab team continue to assess barriers to discharge/monitor patient progress toward functional and medical goals.  Function:  Bathing Bathing position      Bathing parts Body parts bathed by patient: Right arm, Left arm, Chest, Abdomen, Front perineal area, Buttocks, Right upper leg, Left upper leg, Right lower leg, Left lower leg Body parts bathed by helper: Back  Bathing assist Assist Level: Touching or steadying assistance(Pt > 75%)      Upper Body Dressing/Undressing Upper body dressing   What is the patient wearing?: Button up shirt         Button up shirt - Perfomed by patient: Thread/unthread right sleeve, Thread/unthread left sleeve, Button/unbutton shirt Button up shirt - Perfomed by helper: Pull shirt around back    Upper body assist Assist Level: Touching or steadying assistance(Pt > 75%)      Lower Body Dressing/Undressing Lower body dressing   What is the patient wearing?: Underwear, Pants Underwear - Performed by patient: Thread/unthread right underwear leg, Thread/unthread left underwear leg, Pull underwear up/down   Pants- Performed by patient: Thread/unthread right pants leg, Thread/unthread left pants leg, Pull pants up/down                        Lower body assist  Toileting Toileting   Toileting steps completed by patient: Adjust clothing prior to toileting, Performs perineal hygiene, Adjust clothing after toileting      Toileting assist Assist level: Touching or steadying assistance (Pt.75%)   Transfers Chair/bed transfer   Chair/bed transfer method: Stand pivot Chair/bed transfer assist level: Moderate assist (Pt 50 - 74%/lift or lower)       Locomotion Ambulation     Max distance: 50 Assist level:  Moderate assist (Pt 50 - 74%)   Wheelchair   Type: Manual Max wheelchair distance: 10 Assist Level: Touching or steadying assistance (Pt > 75%)  Cognition Comprehension Comprehension assist level: Follows complex conversation/direction with extra time/assistive device  Expression Expression assist level: Expresses complex ideas: With extra time/assistive device  Social Interaction Social Interaction assist level: Interacts appropriately with others with medication or extra time (anti-anxiety, antidepressant).  Problem Solving Problem solving assist level: Solves basic 90% of the time/requires cueing < 10% of the time  Memory Memory assist level: Recognizes or recalls 90% of the time/requires cueing < 10% of the time    Medical Problem List and Plan: 1.Left hemiparesis and functional deficitssecondary to right periventricular white matter infarct   Continue CIR 2. DVT Prophylaxis/Anticoagulation: Pharmaceutical:Lovenox 3. Pain Management:tylenol prn with local Mesures for chronic pain. 4. Mood:LCSW to follow for evaluation and support. 5. Neuropsych: This patientis not fullycapable of making decisions on herown behalf. 6. Skin/Wound Care:Routine pressure relief measures. Maintain adequate nutritional and hydration status. 7. Fluids/Electrolytes/Nutrition:Monitor I/O.  8.LabileHTN:Poorly controlled per records, been seen by nephrology --negative renal work up.Monitorqid for now--continue lisinopril, hydralazine and tenormin. Off HCTZ at this time.Avoid hypotension.   Monitor with increased mobility, hypertensive crisis yesterday   Chlorthalidone started on 11/18   ECG performed yesterday, unavailable for review 9. GERD:Continue Protonix. 10. Hyponatremia:Chronic due to HCTZ?    Na+ 131 on 11/17   Cont to monitor 11. Prediabetic: Hgb A1c- 5.9.   WNL on 11/17 12. Hypoalbuminemia   Supplement initiated 11/17 13. ABLA   Hb 10.5 on 11/17   Cont to  monitor  LOS (Days) 2 A FACE TO FACE EVALUATION WAS PERFORMED  Modena Bellemare Karis Jubanil Tayanna Talford 08/03/2017 9:15 AM

## 2017-08-03 NOTE — Progress Notes (Signed)
Occupational Therapy Session Note  Patient Details  Name: Debbie Hampton MRN: [id number] Date of Birth: [birth year]  Today's Date: 08/03/2017 OT Individual Time: 5927-6394 OT Individual Time Calculation (min): 57 min    Short Term Goals: Week 1:  OT Short Term Goal 1 (Week 1): STG=LTG 2/2 ELOS  Skilled Therapeutic Interventions/Progress Updates:    1:1. Pt completes bathing and dressing at sit to stand level at sink with CGA for peri care/buttock hygiene/ clothing management. With foot elevated on stool and cues to assume seated figure 4 pt able to don B socks. Pt able to pull button up shirt around back today, however still is limited by decreased shoulder ROM requiring HOH A to reach LUE to paper towels. Pt requires VC throughout session for safe reach back prior to sitting. Pt ambulates. Pt ambulates to bathroom completing all components of toileting with CGA. Pt requires lifting A off of low toilet for sit to stand. Exited session with pt seated in recliner with nephew in room, call light in reach and all needs met.  Therapy Documentation Precautions:  Precautions Precautions: Fall Restrictions Weight Bearing Restrictions: No  See Function Navigator for Current Functional Status.   Therapy/Group: Individual Therapy  Tonny Branch 08/03/2017, 10:54 AM

## 2017-08-03 NOTE — Progress Notes (Signed)
Physical Therapy Session Note  Patient Details  Name: Debbie Hampton MRN: [id number] Date of Birth: 01-09-1932  Today's Date: 08/03/2017 PT Individual Time: [id number] PT Individual Time Calculation (min): 73 min   Short Term Goals: Week 1:  PT Short Term Goal 1 (Week 1): Pt will increase bed mobility to mod I.  PT Short Term Goal 2 (Week 1): Pt will increase transfers to S.  PT Short Term Goal 3 (Week 1): Pt will increase ambulation with LRAD to c/s about 150 feet.  PT Short Term Goal 4 (Week 1): Pt will ascend/descend 4 stairs with B rails and min A.  PT Short Term Goal 5 (Week 1): Pt will propel w/c about 100 feet with B UEs and S.   Skilled Therapeutic Interventions/Progress Updates:    Pt seated in recliner upon PT arrival, agreeable to therapy tx and denies pain. Pt performed stand pivot transfer with RW and min assist from recliner<>w/c. Pt transported to gym in w/c. Pt ambulated 2 x 75 ft using RW and min assist, verbal cues for L toe clearance and for L knee extension during stance. Pt performed 1 x 10 sit<>stands without UE support focusing on symmetric WB through LEs and LE strengthening/NMR, min assist. Pt performed 1 x 10 sit<>stands with dynadisc under R foot for emphasis on L LE weightbearing or L LE NMR, min assist. Pt ascended/descended x 4 steps using B handrails and min assist, verbal cues for technique, step to pattern. Pt asking many questions about rehab and d/c planning, therapist discussed her goals, estimated length of stay and purpose of rehab. Pt ambulated x 50 ft with RW and min assist, x 50 ft without AD and mod assist for balance, verbal cues for step length and foot clearance. Pt left seated in recliner at end of session with needs in reach.   Therapy Documentation Precautions:  Precautions Precautions: Fall Restrictions Weight Bearing Restrictions: No   See Function Navigator for Current Functional Status.   Therapy/Group: Individual Therapy  Cresenciano GenreEmily van  Schagen, PT, DPT 08/03/2017, 1:27 PM

## 2017-08-03 NOTE — Progress Notes (Signed)
Occupational Therapy Session Note  Patient Details  Name: Debbie Hampton MRN: [id number] Date of Birth: 1932-05-03  Today's Date: 08/03/2017 OT Individual Time: 1500-1556 OT Individual Time Calculation (min): 56 min    Short Term Goals: Week 1:  OT Short Term Goal 1 (Week 1): STG=LTG 2/2 ELOS  Skilled Therapeutic Interventions/Progress Updates:    1;1. Pt reporting pain in L shoulder and OT educates on importance of pain management and communicating with therapist instead of just "dealing with it." Pt ambualtes part way to day room with min A for balance and VC for looking forward while walking. Pt completes [facility] activities such as coin manipulation, palm to finger translocation and rotation. OT selects 16x16 w/c for proper fit and instructs pt in steering, propulsion, and turning with supervision. OT adjusts caster wheels and foot rests for proper alighment/ improved sitting tolerance in w/c. Pt would benefit from different back on w/c to improve fit around  Side body. Exited session with pt seated in recliner with call light in reach and all needs met.   Therapy Documentation Precautions:  Precautions Precautions: Fall Restrictions Weight Bearing Restrictions: No General:   Vital Signs:  Pain: Pain Assessment Pain Assessment: 0-10 Pain Score: 6  Pain Type: Acute pain Pain Location: Shoulder Pain Orientation: Left Pain Descriptors / Indicators: Aching Pain Frequency: Occasional Pain Onset: On-going Pain Intervention(s): Medication (See eMAR)  See Function Navigator for Current Functional Status.   Therapy/Group: Individual Therapy  Tonny Branch 08/03/2017, 3:59 PM

## 2017-08-04 ENCOUNTER — Inpatient Hospital Stay (HOSPITAL_COMMUNITY): Payer: Medicare Other | Admitting: Physical Therapy

## 2017-08-04 ENCOUNTER — Inpatient Hospital Stay (HOSPITAL_COMMUNITY): Payer: Medicare Other

## 2017-08-04 ENCOUNTER — Inpatient Hospital Stay (HOSPITAL_COMMUNITY): Payer: Medicare Other | Admitting: Occupational Therapy

## 2017-08-04 ENCOUNTER — Inpatient Hospital Stay (HOSPITAL_COMMUNITY): Payer: Medicare Other | Admitting: Speech Pathology

## 2017-08-04 DIAGNOSIS — R269 Unspecified abnormalities of gait and mobility: Secondary | ICD-10-CM

## 2017-08-04 DIAGNOSIS — I69398 Other sequelae of cerebral infarction: Secondary | ICD-10-CM

## 2017-08-04 LAB — BASIC METABOLIC PANEL
ANION GAP: 9 (ref 5–15)
BUN: 22 mg/dL — ABNORMAL HIGH (ref 6–20)
CALCIUM: 9.3 mg/dL (ref 8.9–10.3)
CO2: 25 mmol/L (ref 22–32)
Chloride: 97 mmol/L — ABNORMAL LOW (ref 101–111)
Creatinine, Ser: 0.77 mg/dL (ref 0.44–1.00)
GLUCOSE: 226 mg/dL — AB (ref 65–99)
Potassium: 3.8 mmol/L (ref 3.5–5.1)
Sodium: 131 mmol/L — ABNORMAL LOW (ref 135–145)

## 2017-08-04 NOTE — Care Management Note (Signed)
[facility] Individual Statement of Services  Patient Name:  Debbie BalesCarol Becht  Date:  08/04/2017  Welcome [facility] the [facility].  Our goal is to provide you with an individualized program based on your diagnosis and situation, designed to meet your specific needs.  With this comprehensive rehabilitation program, you will be expected to participate in at least 3 hours of rehabilitation therapies Monday-Friday, with modified therapy programming on the weekends.  Your rehabilitation program will include the following services:  Physical Therapy (PT), Occupational Therapy (OT), Speech Therapy (ST), 24 hour per day rehabilitation nursing, Case Management (Social Worker), Rehabilitation Medicine, Nutrition Services and Pharmacy Services  Weekly team conferences will be held on Wednesday to discuss your progress.  Your Social Worker will talk with you frequently to get your input and to update you on team discussions.  Team conferences with you and your family in attendance may also be held.  Expected length of stay: 7-10 days Overall anticipated outcome: mod/i-supervision level  Depending on your progress and recovery, your program may change. Your Social Worker will coordinate services and will keep you informed of any changes. Your Social Worker's name and contact numbers are listed  below.  The following services may also be recommended but are not provided by the [facility]:    Home Health Rehabiltiation Services  Outpatient Rehabilitation Services    Arrangements will be made to provide these services after discharge if needed.  Arrangements include referral to agencies that provide these services.  Your insurance has been verified to be: [employer] Your primary doctor is:  Kandyce RudMarcus Babaoff  Pertinent information will be shared with your doctor and your insurance company.  Social Worker:  Dossie DerBecky Kataryna Mcquilkin, SW [phone] or [phone])  [phone]  Information discussed with and copy given to patient by: Lucy Chrisupree, Damyon Mullane G, 08/04/2017, 11:48 AM

## 2017-08-04 NOTE — Progress Notes (Signed)
Social Work Assessment and Plan Social Work Assessment and Plan  Patient Details  Name: Debbie BalesCarol Haefele MRN: [id number] Date of Birth: [birth year]  Today's Date: 08/04/2017  Problem List:  Patient Active Problem List   Diagnosis Date Noted  . Labile blood pressure   . Benign essential HTN   . Hypertensive crisis   . Hyponatremia   . Prediabetes   . Hypoalbuminemia due to protein-calorie malnutrition (HCC)   . Acute blood loss anemia   . 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 Acute CVA (cerebrovascular accident) (HCC) 07/29/2017  . Syncope, near 03/31/2016   Past Medical History:  Past Medical History:  Diagnosis Date  . Bilateral shoulder pain   . Colon polyp   . Constipation   . GERD (gastroesophageal reflux disease)   . Hypertension   . Impaired fasting glucose   . Syncope 03/2016   with [facility]  . Varicose veins of both lower extremities    Past Surgical History:  Past Surgical History:  Procedure Laterality Date  . TONSILLECTOMY    . TUMOR REMOVAL     ovaries   Social History:  reports that she has quit smoking. Her smoking use included cigarettes. she has never used smokeless tobacco. She reports that she does not drink alcohol or use drugs.  Family / Support Systems Marital Status: Married Patient Roles: Spouse, Parent, Caregiver Spouse/Significant Other: Ross [phone] Children: Deniece PortelaWayne and Alessandra GroutWesley-son [phone]  [phone] Other Supports: friends Anticipated Caregiver: cousin and hired caregivers Ability/Limitations of Caregiver: Son's both work Medical laboratory scientific officerCaregiver Availability: Other (Comment)(need to [facility] arrangements [facility]) Family Dynamics: Close knit with two son's who are local and involved. Pt has always been independent and now a caregiver for her husband who was diagnosed with dementia 2 months ago.   Social History Preferred language: English Religion: None Cultural Background: No issues Education: Some College Read: Yes Write:  Yes Employment Status: Retired Fish farm managerLegal Hisotry/Current Legal Issues: No issues Guardian/Conservator: none-according to MD pt is not fully capable of making her own decisions while here. Will look toward their son's if any decisions need to be made while here. Both are visiting and assisting with their father.   Abuse/Neglect Abuse/Neglect Assessment Can Be Completed: Yes Physical Abuse: Denies Verbal Abuse: Denies Sexual Abuse: Denies Exploitation of patient/patient's resources: Denies Self-Neglect: Denies  Emotional Status Pt's affect, behavior adn adjustment status: Pt is motivated to do well and recover from this stroke. Her plan is for husband to stay in the ALF or Memory care unit until she is fully recovered to provide care again to him. She is very realistic regarding what needs to happen for her and making sure her husband is taken care of also. Recent Psychosocial Issues: healthy prior to admission and was providing care to her husband recently diagnosed with dementia Pyschiatric History: No history deferred depression screen due to still adjusting to the new unit and is able to verbalize her concerns and feelings. She is doing quite well in her function and recovering daily, which is encouraging to her. Substance Abuse History: No issues  Patient / Family Perceptions, Expectations & Goals Pt/Family understanding of illness & functional limitations: Pt and son can explain her stroke and deficits. Both are pleased with her progress and now on the rehab unit feel it will be that much better. Both have spoken with the MD and feel their questions and concerns have been addressed. Premorbid pt/family roles/activities: wife, mother, retiree, church member, Chief Financial Officerhome owner, etc Anticipated changes in roles/activities/participation: resume Pt/family expectations/goals:  Pt states: " I hope to be able to take care of myself by the time I leave here and will stay as long as you all will have me."  Son  states: " We will arrange whatever care she needs for both of them."  [employer] IncCommunity Resources Community Agencies: Other (Comment)([facility] Premorbid Home Care/DME Agencies: None Transportation available at discharge: [place] transportation service and family Resource referrals recommended: Support group (specify)  Discharge Planning Living Arrangements: Spouse/significant other Support Systems: Spouse/significant other, Children, Manufacturing engineerriends/neighbors, Psychologist, clinicalChurch/faith community Type of Residence: Private residence Insurance Resources: Media plannerrivate Insurance (specify)(UHC-Medicare) Financial Resources: Restaurant manager, fast foodocial Security Financial Screen Referred: No Living Expenses: Own Money Management: Patient Does the patient have any problems obtaining your medications?: No Home Management: Self Patient/Family Preliminary Plans: [place]independent level and cared for her husband who was recently diagnosed with dementia. He is being evaluated for ALF or memory care unit while she is here and when she goes home. Will need to re-evaluate what level pt is and whether she can be a caregiver for her husband. Sw Barriers to Discharge: Decreased caregiver support Sw Barriers to Discharge Comments: Will need to evaluate if pt needs caregiver at discharge from rehab Social Work Anticipated Follow Up Needs: HH/OP, ALF/IL, Support Group  Clinical Impression Very pleasant female who is highly motivated to do well but also realistic regarding if she will be able to provide care to her husband when she first is discharged form here. Her son's are assisting with husband and [facility]. Will await team's evaluation and work on a safe plan for pt. She is recovering from her stroke and getting more movement. Pt also may require care upon discharge so will see if can have in her residence versus  going to ALF.   Lucy Chrisupree, Anaiza Behrens G 08/04/2017, 1:14 PM

## 2017-08-04 NOTE — Progress Notes (Signed)
Patient information reviewed and entered into eRehab system by Brystal Kildow, RN, CRRN, PPS Coordinator.  Information including medical coding and functional independence measure will be reviewed and updated through discharge.     Per nursing patient was given "Data Collection Information Summary for Patients in Inpatient Rehabilitation Facilities with attached "Privacy Act Statement-Health Care Records" upon admission.  

## 2017-08-04 NOTE — Progress Notes (Signed)
Physical Therapy Session Note  Patient Details  Name: Debbie Hampton MRN: [id number] Date of Birth: Aug 25, 1932  Today's Date: 08/04/2017 PT Individual Time: 0805-0900 PT Individual Time Calculation (min): 55 min   Short Term Goals: Week 1:  PT Short Term Goal 1 (Week 1): Pt will increase bed mobility to mod I.  PT Short Term Goal 2 (Week 1): Pt will increase transfers to S.  PT Short Term Goal 3 (Week 1): Pt will increase ambulation with LRAD to c/s about 150 feet.  PT Short Term Goal 4 (Week 1): Pt will ascend/descend 4 stairs with B rails and min A.  PT Short Term Goal 5 (Week 1): Pt will propel w/c about 100 feet with B UEs and S.   Skilled Therapeutic Interventions/Progress Updates: Pt presented in bed completing breakfast and agreeable to therapy. Pt indicating feels to have more control in LUE this am but fatigued as did not sleep well previous night. MD entered room to perform assessment and MD was able to answer several of pt's queries. Pt donned clothing with minA from PTA for time management. Pt was able to button shirt with supervision. Pt ambulated to rehab gym with RW and min guard with min cues for increasing step length and improving heel strike. Pt participated in standing balance activities to increase wt shifting to L and coordination. Pt also performed sit to/from stand no AD for increasing L wt shift and improving postural control without AD. Pt returned to w/c and transported back to room. Pt remained in w/c at end of session with call bell within reach and needs met.      Therapy Documentation Precautions:  Precautions Precautions: Fall Restrictions Weight Bearing Restrictions: No  See Function Navigator for Current Functional Status.   Therapy/Group: Individual Therapy  Belky Mundo  Maverick Dieudonne, PTA  08/04/2017, 2:34 PM

## 2017-08-04 NOTE — Progress Notes (Signed)
[facility] PHYSICAL MEDICINE & REHABILITATION     PROGRESS NOTE  Subjective/Complaints:  Pt relates long hx of labile HTN, has had hypotensive episodes from IV hydralazine.  Has had good results with clonidine patch  ROS: Denies CP, SOB, N/V/D.  Objective: Vital Signs: Blood pressure 138/65, pulse 79, temperature (!) 97.4 F (36.3 C), temperature source Oral, resp. rate 18, SpO2 99 %. No results found. Recent Labs    08/02/17 0542  WBC 6.3  HGB 10.5*  HCT 32.2*  PLT 219   Recent Labs    08/02/17 0542  NA 131*  K 4.4  CL 98*  GLUCOSE 99  BUN 17  CREATININE 0.71  CALCIUM 9.1   CBG (last 3)  No results for input(s): GLUCAP in the last 72 hours.  Wt Readings from Last 3 Encounters:  07/29/17 53.5 kg (117 lb 14.4 oz)  03/31/16 60.8 kg (134 lb)    Physical Exam:  BP 138/65   Pulse 79   Temp (!) 97.4 F (36.3 C) (Oral)   Resp 18   SpO2 99%  Constitutional: She appearswell-developedand well-nourished.No distress.  HENT: Normocephalicand atraumatic.  Cardiac: RRR. No JVD. Respiratory:Effort normal breath sounds normal.  GI: Bowel sounds are normal. She exhibitsno distension.  Musculoskeletal: She exhibits noedemaor tenderness. Frozen left shoulder due to prior injury. Neurological: She isalertand oriented. Mild left facial weakness.  Speech clear but tangential needing redirection.  Able to follow simple motor commands.  Minimal LUE > LLE weakness.R delt weak due to MSK injury Skin: Skin iswarmand dry. She isnot diaphoretic.  Psychiatric: Her mood appears anxious. Her speech istangential.Verbose.   Assessment/Plan: 1. Functional deficits secondary to right periventricular white matter infarct which require 3+ hours per day of interdisciplinary therapy in a comprehensive inpatient rehab setting. Physiatrist is providing close team supervision and 24 hour management of active medical problems listed below. Physiatrist and rehab team continue  to assess barriers to discharge/monitor patient progress toward functional and medical goals.  Function:  Bathing Bathing position      Bathing parts Body parts bathed by patient: Right arm, Left arm, Chest, Abdomen, Front perineal area, Buttocks, Right upper leg, Left upper leg, Right lower leg, Left lower leg Body parts bathed by helper: Back  Bathing assist Assist Level: Touching or steadying assistance(Pt > 75%)      Upper Body Dressing/Undressing Upper body dressing   What is the patient wearing?: Button up shirt         Button up shirt - Perfomed by patient: Thread/unthread right sleeve, Thread/unthread left sleeve, Button/unbutton shirt Button up shirt - Perfomed by helper: Pull shirt around back    Upper body assist Assist Level: Touching or steadying assistance(Pt > 75%)      Lower Body Dressing/Undressing Lower body dressing   What is the patient wearing?: Underwear, Pants Underwear - Performed by patient: Thread/unthread right underwear leg, Thread/unthread left underwear leg, Pull underwear up/down   Pants- Performed by patient: Thread/unthread right pants leg, Thread/unthread left pants leg, Pull pants up/down                        Lower body assist        Toileting Toileting   Toileting steps completed by patient: Adjust clothing prior to toileting, Performs perineal hygiene, Adjust clothing after toileting      Toileting assist Assist level: Touching or steadying assistance (Pt.75%)   Transfers Chair/bed transfer   Chair/bed transfer method: Stand pivot Chair/bed transfer  assist level: Touching or steadying assistance (Pt > 75%) Chair/bed transfer assistive device: Armrests, Patent attorneyWalker     Locomotion Ambulation     Max distance: 75 ft Assist level: Touching or steadying assistance (Pt > 75%)   Wheelchair   Type: Manual Max wheelchair distance: 10 Assist Level: Touching or steadying assistance (Pt > 75%)  Cognition Comprehension  Comprehension assist level: Follows complex conversation/direction with extra time/assistive device  Expression Expression assist level: Expresses complex ideas: With extra time/assistive device  Social Interaction Social Interaction assist level: Interacts appropriately with others with medication or extra time (anti-anxiety, antidepressant).  Problem Solving Problem solving assist level: Solves basic 90% of the time/requires cueing < 10% of the time  Memory Memory assist level: Recognizes or recalls 90% of the time/requires cueing < 10% of the time    Medical Problem List and Plan: 1.Left hemiparesis and functional deficitssecondary to right periventricular white matter infarct   Continue CIR-PT,OT 2. DVT Prophylaxis/Anticoagulation: Pharmaceutical:Lovenox 3. Pain Management:tylenol prn with local [place] for chronic pain. 4. Mood:LCSW to follow for evaluation and support. 5. Neuropsych: This patientis not fullycapable of making decisions on herown behalf. 6. Skin/Wound Care:Routine pressure relief measures. Maintain adequate nutritional and hydration status. 7. Fluids/Electrolytes/Nutrition:Monitor I/O.  8.LabileHTN:Poorly controlled per records, been seen by nephrology --negative renal work up.Monitorqid for now--continue lisinopril, hydralazine and tenormin. Off HCTZ at this time.Avoid hypotension. Vitals:   08/04/17 0341 08/04/17 0634  BP: (!) 203/72 138/65  Pulse: 65 79  Resp: 18   Temp: (!) 97.4 F (36.3 C)   SpO2: 99%       Chlorthalidone started on 11/18, cont to monitor, consider change back to low dose HCTZ and possibly add clonidine patch if needed   ECG performed yesterday, unavailable for review 9. GERD:Continue Protonix. 10. Hyponatremia:Chronic due to HCTZ?    Na+ 131 on 11/17   Cont to monitor 11. Prediabetic: Hgb A1c- 5.9.   WNL on 11/17 12. Hypoalbuminemia   Supplement initiated 11/17 13. ABLA   Hb 10.5 on 11/17   Cont to  monitor  LOS (Days) 3 A FACE TO FACE EVALUATION WAS PERFORMED  Erick Colacendrew E Kirsteins 08/04/2017 8:08 AM

## 2017-08-04 NOTE — IPOC Note (Signed)
Overall Plan of Care [facility](IPOC) Patient Details Name: Debbie BalesCarol Hampton MRN: [id number] DOB: 1932/01/18  Admitting Diagnosis: <principal problem not specified>  Hospital Problems: Active Problems:   Stroke (cerebrum) (HCC)   Labile blood pressure   Benign essential HTN   Hypertensive crisis   Hyponatremia   Prediabetes   Hypoalbuminemia due to protein-calorie malnutrition (HCC)   Acute blood loss anemia     Functional Problem List: Nursing Endurance, Medication Management, Perception, Safety  PT Balance, Endurance, Motor, Sensory  OT Balance, Cognition, Endurance, Motor, Safety, Sensory  SLP Cognition  TR         Basic ADL's: OT Grooming, Bathing, Dressing, Toileting     Advanced  ADL's: OT Simple Meal Preparation     Transfers: PT Bed Mobility, Bed to Chair, Customer service managerCar  OT Toilet, Tub/Shower     Locomotion: PT Stairs, Psychologist, prison and probation servicesWheelchair Mobility, Ambulation     Additional Impairments: OT Fuctional Use of Upper Extremity  SLP Social Cognition   Memory, Attention, Awareness  TR      Anticipated Outcomes Item Anticipated Outcome  Self Feeding MOD I  Swallowing      Basic self-care  MOD I  Toileting   MOD I toilet; S shower   Bathroom Transfers MOD I toilet; S shower  Bowel/Bladder  Supervision  Transfers  mod I transfers  Locomotion  mod I gait, S staris, w/c mobility mod I  Communication     Cognition  mod I   Pain  <3 on a 0-10 pain scale  Safety/Judgment  Supervision with no falls   Therapy Plan: PT Intensity: Minimum of 1-2 x/day ,45 to 90 minutes PT Frequency: 5 out of 7 days PT Duration Estimated Length of Stay: 7 to 10 days OT Intensity: Minimum of 1-2 x/day, 45 to 90 minutes OT Frequency: 5 out of 7 days OT Duration/Estimated Length of Stay: 7-10 SLP Intensity: Minumum of 1-2 x/day, 30 to 90 minutes SLP Frequency: 3 to 5 out of 7 days SLP Duration/Estimated Length of Stay: 7-10 days     Team Interventions: Nursing Interventions Patient/Family  Education, Disease Management/Prevention, Medication Management, Discharge Planning, Psychosocial Support  PT interventions Ambulation/gait training, Warden/rangerBalance/vestibular training, Functional mobility training, DME/adaptive equipment instruction, Neuromuscular re-education, Patient/family education, Therapeutic Exercise, Visual/perceptual remediation/compensation, UE/LE Coordination activities, Therapeutic Activities, UE/LE Strength taining/ROM, Wheelchair propulsion/positioning, Stair training  OT Interventions Balance/vestibular training, Discharge planning, Pain management, Therapeutic Activities, UE/LE Coordination activities, Self Care/advanced ADL retraining, Cognitive remediation/compensation, Functional mobility training, Patient/family education, Therapeutic Exercise, Visual/perceptual remediation/compensation, Community reintegration, DME/adaptive equipment instruction, Neuromuscular re-education, Psychosocial support, UE/LE Strength taining/ROM  SLP Interventions Cognitive remediation/compensation, Cueing hierarchy, Functional tasks, Patient/family education  TR Interventions    SW/CM Interventions Discharge Planning, Psychosocial Support, Patient/Family Education   Barriers to Discharge MD  Medical stability  Nursing Decreased caregiver support, Lack of/limited family support Husband has Dementia and going into SNF  PT Inaccessible home environment    OT      SLP Decreased caregiver support    SW Decreased caregiver support Will need to evaluate if pt needs caregiver at discharge from rehab   Team Discharge Planning: Destination: PT-Home ,OT- Home , SLP-Home Projected Follow-up: PT-Home health PT, OT-  24 hour supervision/assistance, SLP-Other (comment)(TBD) Projected Equipment Needs: PT-To be determined, OT- Tub/shower seat, To be determined, SLP-None recommended by SLP Equipment Details: PT- , OT-  Patient/family involved in discharge planning: PT- Patient,  OT-Patient,  SLP-Patient  MD ELOS: 10-14d Medical Rehab Prognosis:  Good Assessment:  83 years old female with history of  HTN, GERD who was admitted on 07/29/17 with reports of multiple falls for 2-3 days PTA, left sided weakness and hypertensive emergency--SBP 260 pr reports.MRI brain done revealing 1.5 cm infarct in deep right periventricular white matter. 2D echo showed EF 60-65% with mild MVR, mild to moderate left atrial dilatation, and moderate pulmonary HTN. Carotid dopplers revealed moderate to large amount of atherosclerotic plaque with R >L 50-69% ICA stenosis. 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 Neurology recommended continuing ASA/Plavix for secondary stroke prevention and repeat follow up imaging to monitor aneurysm in 6- 12 months.    Now requiring 24/7 Rehab RN,MD, as well as CIR level PT, OT and SLP.  Treatment team will focus on ADLs and mobility with goals set at Sup/Mod I    See Team Conference Notes for weekly updates to the plan of care

## 2017-08-04 NOTE — Progress Notes (Signed)
Speech Language Pathology Daily Session Note  Patient Details  Name: Debbie Hampton MRN: [id number] Date of Birth: 22-Oct-1931  Today's Date: 08/04/2017 SLP Individual Time: 1030-1100 SLP Individual Time Calculation (min): 30 min  Short Term Goals: Week 1: SLP Short Term Goal 1 (Week 1): STG=LTG due to ELOS   Skilled Therapeutic Interventions: Skilled treatment session focused on cognition goals. SLP facilitated session by providing supervision question targeting anticipatory awareness. Pt benefits from redirection to questions d/t hyperverbal (anxious) comments. Pt able to list activities that would be safe at home with Min A to supervision cues. Pt was left upright in recliner with all needs within reach. Continue per current plan.      Function:    Cognition Comprehension Comprehension assist level: Follows complex conversation/direction with extra time/assistive device  Expression   Expression assist level: Expresses complex ideas: With extra time/assistive device  Social Interaction Social Interaction assist level: Interacts appropriately with others with medication or extra time (anti-anxiety, antidepressant).  Problem Solving Problem solving assist level: Solves complex 90% of the time/cues < 10% of the time;Solves basic problems with no assist  Memory Memory assist level: Recognizes or recalls 90% of the time/requires cueing < 10% of the time    Pain    Therapy/Group: Individual Therapy  Debbie Hampton 08/04/2017, 12:04 PM

## 2017-08-04 NOTE — Progress Notes (Signed)
Occupational Therapy Session Note  Patient Details  Name: Debbie Hampton MRN: [id number] Date of Birth: Jan 19, 1932  Today's Date: 08/04/2017 OT Individual Time: 1300-1400 OT Individual Time Calculation (min): 60 min    Short Term Goals: Week 1:  OT Short Term Goal 1 (Week 1): STG=LTG 2/2 ELOS  Skilled Therapeutic Interventions/Progress Updates:    Pt seen for OT ADL bathing/dressing session. Pt sitting up in recliner upon arrival, agreeable to tx session. She ambulated throughout session with RW and CGA, mod cuing required for proper management of RW in functional context. She bathed seated on tub bench, standing with use of grab bars to complete pericare/buttock hygiene. VCs to encourage use of L UE into bathing task and pt able to do so at mod I level. She returned to toilet to dress and with increased time able to manipulate small buttons on button down shirt and on pants. She returned to recliner at end of session, left seated with all needs in reach awaiting hand off to PT.  Discussed throughout session POC, OT goals, and d/c planning.   Therapy Documentation Precautions:  Precautions Precautions: Fall Restrictions Weight Bearing Restrictions: No Pain:   No/ denies pain  See Function Navigator for Current Functional Status.   Therapy/Group: Individual Therapy  Lewis, Kynadie Yaun C 08/04/2017, 7:19 AM

## 2017-08-04 NOTE — Progress Notes (Signed)
Physical Therapy Session Note  Patient Details  Name: Debbie BalesCarol Hampton MRN: [id number] Date of Birth: 1932/06/30  Today's Date: 08/04/2017 PT Individual Time: 1400-1445 PT Individual Time Calculation (min): 45 min   Short Term Goals: Week 1:  PT Short Term Goal 1 (Week 1): Pt will increase bed mobility to mod I.  PT Short Term Goal 2 (Week 1): Pt will increase transfers to S.  PT Short Term Goal 3 (Week 1): Pt will increase ambulation with LRAD to c/s about 150 feet.  PT Short Term Goal 4 (Week 1): Pt will ascend/descend 4 stairs with B rails and min A.  PT Short Term Goal 5 (Week 1): Pt will propel w/c about 100 feet with B UEs and S.   Skilled Therapeutic Interventions/Progress Updates:    Pt seated in recliner upon PT arrival, agreeable to therapy tx and denies pain. Pt ambulated from room>gym x 150 ft with RW and min assist, verbal cues for foot clearance on L. Pt worked on standing dynamic balance without UE support in order to stand on foam and throw horseshoes x 2 trials, and to throw/catch beach ball x 2 trials. Pt performed sit<>stands x 5 without UE support, min assist for L LE NMR, worked on weightshifting over L LE in standing. Pt ascended/descended x 4 steps using single handrail and min assist. Pt left seated EOB at end of session with RN and NT present to provide medications.   Therapy Documentation Precautions:  Precautions Precautions: Fall Restrictions Weight Bearing Restrictions: No   See Function Navigator for Current Functional Status.   Therapy/Group: Individual Therapy  Cresenciano GenreEmily van Schagen, PT, DPT 08/04/2017, 12:49 PM

## 2017-08-05 ENCOUNTER — Inpatient Hospital Stay (HOSPITAL_COMMUNITY): Payer: Medicare Other | Admitting: Speech Pathology

## 2017-08-05 ENCOUNTER — Inpatient Hospital Stay (HOSPITAL_COMMUNITY): Payer: Medicare Other | Admitting: Physical Therapy

## 2017-08-05 ENCOUNTER — Inpatient Hospital Stay (HOSPITAL_COMMUNITY): Payer: Medicare Other | Admitting: Occupational Therapy

## 2017-08-05 ENCOUNTER — Inpatient Hospital Stay (HOSPITAL_COMMUNITY): Payer: Medicare Other

## 2017-08-05 NOTE — Progress Notes (Signed)
Speech Language Pathology Daily Session Note  Patient Details  Name: Debbie BalesCarol Hampton MRN: [id number] Date of Birth: 02/04/1932  Today's Date: 08/05/2017 SLP Individual Time: 1105-1130 SLP Individual Time Calculation (min): 25 min  Short Term Goals: Week 1: SLP Short Term Goal 1 (Week 1): STG=LTG due to ELOS   Skilled Therapeutic Interventions:  Pt was seen for skilled ST targeting cognitive goals.  SLP facilitated the session with a basic medication management task to address recall of new information.  Pt was able to recall function of medications when named when medications were known to her from prior to admission; however, she was unfamiliar with newly scheduled medications.  Therapist provided pt with a written list of medications to maximize carryover of information in between therapy sessions.  Will address organization with a pill box at next available appointment.  Pt left in recliner with call bell within reach.  Continue per current plan of care.    Function:  Eating Eating   Modified Consistency Diet: No Eating Assist Level: No help, No cues           Cognition Comprehension Comprehension assist level: Understands complex 90% of the time/cues 10% of the time  Expression   Expression assist level: Expresses basic needs/ideas: With extra time/assistive device  Social Interaction Social Interaction assist level: Interacts appropriately with others with medication or extra time (anti-anxiety, antidepressant).  Problem Solving Problem solving assist level: Solves complex 90% of the time/cues < 10% of the time  Memory Memory assist level: Recognizes or recalls 90% of the time/requires cueing < 10% of the time    Pain Pain Assessment Pain Assessment: No/denies pain  Therapy/Group: Individual Therapy  Calixto Pavel, Melanee SpryNicole L 08/05/2017, 1:18 PM

## 2017-08-05 NOTE — IPOC Note (Signed)
Overall Plan of Care [facility](IPOC) Patient Details Name: Debbie BalesCarol Hampton MRN: [id number] DOB: 1932/01/18  Admitting Diagnosis: <principal problem not specified>  Hospital Problems: Active Problems:   Stroke (cerebrum) (HCC)   Labile blood pressure   Benign essential HTN   Hypertensive crisis   Hyponatremia   Prediabetes   Hypoalbuminemia due to protein-calorie malnutrition (HCC)   Acute blood loss anemia     Functional Problem List: Nursing Endurance, Medication Management, Perception, Safety  PT Balance, Endurance, Motor, Sensory  OT Balance, Cognition, Endurance, Motor, Safety, Sensory  SLP Cognition  TR         Basic ADL's: OT Grooming, Bathing, Dressing, Toileting     Advanced  ADL's: OT Simple Meal Preparation     Transfers: PT Bed Mobility, Bed to Chair, Customer service managerCar  OT Toilet, Tub/Shower     Locomotion: PT Stairs, Psychologist, prison and probation servicesWheelchair Mobility, Ambulation     Additional Impairments: OT Fuctional Use of Upper Extremity  SLP Social Cognition   Memory, Attention, Awareness  TR      Anticipated Outcomes Item Anticipated Outcome  Self Feeding MOD I  Swallowing      Basic self-care  MOD I  Toileting   MOD I toilet; S shower   Bathroom Transfers MOD I toilet; S shower  Bowel/Bladder  Supervision  Transfers  mod I transfers  Locomotion  mod I gait, S staris, w/c mobility mod I  Communication     Cognition  mod I   Pain  <3 on a 0-10 pain scale  Safety/Judgment  Supervision with no falls   Therapy Plan: PT Intensity: Minimum of 1-2 x/day ,45 to 90 minutes PT Frequency: 5 out of 7 days PT Duration Estimated Length of Stay: 7 to 10 days OT Intensity: Minimum of 1-2 x/day, 45 to 90 minutes OT Frequency: 5 out of 7 days OT Duration/Estimated Length of Stay: 7-10 SLP Intensity: Minumum of 1-2 x/day, 30 to 90 minutes SLP Frequency: 3 to 5 out of 7 days SLP Duration/Estimated Length of Stay: 7-10 days     Team Interventions: Nursing Interventions Patient/Family  Education, Disease Management/Prevention, Medication Management, Discharge Planning, Psychosocial Support  PT interventions Ambulation/gait training, Warden/rangerBalance/vestibular training, Functional mobility training, DME/adaptive equipment instruction, Neuromuscular re-education, Patient/family education, Therapeutic Exercise, Visual/perceptual remediation/compensation, UE/LE Coordination activities, Therapeutic Activities, UE/LE Strength taining/ROM, Wheelchair propulsion/positioning, Stair training  OT Interventions Balance/vestibular training, Discharge planning, Pain management, Therapeutic Activities, UE/LE Coordination activities, Self Care/advanced ADL retraining, Cognitive remediation/compensation, Functional mobility training, Patient/family education, Therapeutic Exercise, Visual/perceptual remediation/compensation, Community reintegration, DME/adaptive equipment instruction, Neuromuscular re-education, Psychosocial support, UE/LE Strength taining/ROM  SLP Interventions Cognitive remediation/compensation, Cueing hierarchy, Functional tasks, Patient/family education  TR Interventions    SW/CM Interventions Discharge Planning, Psychosocial Support, Patient/Family Education   Barriers to Discharge MD  Medical stability  Nursing Decreased caregiver support, Lack of/limited family support Husband has Dementia and going into SNF  PT Inaccessible home environment    OT      SLP Decreased caregiver support    SW Decreased caregiver support Will need to evaluate if pt needs caregiver at discharge from rehab   Team Discharge Planning: Destination: PT-Home ,OT- Home , SLP-Home Projected Follow-up: PT-Home health PT, OT-  24 hour supervision/assistance, SLP-Other (comment)(TBD) Projected Equipment Needs: PT-To be determined, OT- Tub/shower seat, To be determined, SLP-None recommended by SLP Equipment Details: PT- , OT-  Patient/family involved in discharge planning: PT- Patient,  OT-Patient,  SLP-Patient  MD ELOS: 10-14d Medical Rehab Prognosis:  Good Assessment:  83 years old female with history of  HTN, GERD who was admitted on 07/29/17 with reports of multiple falls for 2-3 days PTA, left sided weakness and hypertensive emergency--SBP 260 pr reports.MRI brain done revealing 1.5 cm infarct in deep right periventricular white matter. 2D echo showed EF 60-65% with mild MVR, mild to moderate left atrial dilatation, and moderate pulmonary HTN. Carotid dopplers revealed moderate to large amount of atherosclerotic plaque with R >L 50-69% ICA stenosis. 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 Neurology recommended continuing ASA/Plavix for secondary stroke prevention and repeat follow up imaging to monitor aneurysm in 6- 12 months.    Now requiring 24/7 Rehab RN,MD, as well as CIR level PT, OT and SLP.  Treatment team will focus on ADLs and mobility with goals set at Sup/Mod I    See Team Conference Notes for weekly updates to the plan of care 

## 2017-08-05 NOTE — Progress Notes (Signed)
Physical Therapy Session Note  Patient Details  Name: Jones BalesCarol Veltri MRN: [id number] Date of Birth: [birth year]  Today's Date: 08/05/2017 PT Individual Time: 1000-1100 PT Individual Time Calculation (min): 60 min   Short Term Goals: Week 1:  PT Short Term Goal 1 (Week 1): Pt will increase bed mobility to mod I.  PT Short Term Goal 2 (Week 1): Pt will increase transfers to S.  PT Short Term Goal 3 (Week 1): Pt will increase ambulation with LRAD to c/s about 150 feet.  PT Short Term Goal 4 (Week 1): Pt will ascend/descend 4 stairs with B rails and min A.  PT Short Term Goal 5 (Week 1): Pt will propel w/c about 100 feet with B UEs and S.   Skilled Therapeutic Interventions/Progress Updates: Pt presented in recliner agreeable to therapy. Pt dressed with set up sitting in recliner. Pt able to stand and complete LB dressing. Pt indicating increased fatigue this session, PTA provided pt edu regarding healing process and fluctuating fatigue levels. Pt transported to rehab gym total assist for energy conservation. Performed gait training with shoes ambulating 14700ft x 1 with cues for increasing heel strike and decreasing use of BUE pressure on RW. Pt performed standing balance reaching and crossing for forced use of LUE. Pt transported to day room and performed NuStep L1 x 7 min for reciprocal activity and endurance. Pt returned to room and handed off to OT for following session.      Therapy Documentation Precautions:  Precautions Precautions: Fall Restrictions Weight Bearing Restrictions: No   See Function Navigator for Current Functional Status.   Therapy/Group: Individual Therapy  Phillip Maffei  Libby Goehring, PTA  08/05/2017, 12:28 PM

## 2017-08-05 NOTE — Progress Notes (Signed)
Occupational Therapy Session Note  Patient Details  Name: Debbie BalesCarol Hampton MRN: [id number] Date of Birth: [birth year]  Today's Date: 08/05/2017 OT Individual Time: 1000-1030 OT Individual Time Calculation (min): 30 min    Short Term Goals: Week 1:  OT Short Term Goal 1 (Week 1): STG=LTG 2/2 ELOS  Skilled Therapeutic Interventions/Progress Updates:    Pt seen for OT session addressing IADL goals focusing on functional standing balance/endurance and functional use of L UE. Pt received in w/c upon arrival with hand off from PT. Pt agreeable to tx session though declining bathing/dressing this morning. In ADL apartment, pt completed simple meal prep activity making jello at stove top level. She ambulated throughout kitchen with RW and supervision. VCs, education, and demonstration provided throughout for RW management in kitchen as well as safe method for transferring items in kitchen while managing RW. She required mod cuing for recalling and following of 3 step directions for making jello. Encouraged to use L UE to assist with lifting and stirring, some compensatory strategies noted for shoulder flexion above 90 degrees.  Pt left seated in w/c at end of session with hand off to next OT.   Therapy Documentation Precautions:  Precautions Precautions: Fall Restrictions Weight Bearing Restrictions: No Pain:   No/ denies pain  See Function Navigator for Current Functional Status.   Therapy/Group: Individual Therapy  Lewis, Quintez Maselli C 08/05/2017, 6:36 AM

## 2017-08-05 NOTE — Progress Notes (Signed)
Occupational Therapy Session Note  Patient Details  Name: Jones BalesCarol Zeller MRN: [id number] Date of Birth: Jan 31, 1932  Today's Date: 08/05/2017 OT Individual Time: 1030-1100 OT Individual Time Calculation (min): 30 min    Short Term Goals: Week 1:  OT Short Term Goal 1 (Week 1): STG=LTG 2/2 ELOS      Skilled Therapeutic Interventions/Progress Updates:    Pt seen this session to work on sit to stand skills from low surfaces (low leather couch, shower seat and toilet) with cues for forward lean and hand placement to push up with close S.  She also worked on side stepping in and out of shower stall 2x with simulated set up with bars and sliding doors. Pt able to step in and out with steadying A.  Recommended that her son take a cell phone picture of shower set up to ensure her grab bars are in a good location.  Pt taken back to room and used restroom at end of session. Hand off to speech therapist for next session.   Therapy Documentation Precautions:  Precautions Precautions: Fall Restrictions Weight Bearing Restrictions: No     Pain: no c/o pain   ADL:  See Function Navigator for Current Functional Status.   Therapy/Group: Individual Therapy  SAGUIER,JULIA 08/05/2017, 1:10 PM

## 2017-08-05 NOTE — Progress Notes (Signed)
[facility] PHYSICAL MEDICINE & REHABILITATION     PROGRESS NOTE  Subjective/Complaints:  Patient without new complaints today.  She is talking with nursing.  She has discussed her bilateral shoulder pain which is chronic.  Had a prior fall with no fractures. X-rays of left shoulder were performed in 2017 which showed some mild acromioclavicular arthritis. ROS: Denies CP, SOB, N/V/D.  Objective: Vital Signs: Blood pressure (!) 154/43, pulse 61, temperature 98 F (36.7 C), temperature source Oral, resp. rate 18, SpO2 100 %. No results found. No results for input(s): WBC, HGB, HCT, PLT in the last 72 hours. Recent Labs    08/04/17 0856  NA 131*  K 3.8  CL 97*  GLUCOSE 226*  BUN 22*  CREATININE 0.77  CALCIUM 9.3   CBG (last 3)  No results for input(s): GLUCAP in the last 72 hours.  Wt Readings from Last 3 Encounters:  07/29/17 53.5 kg (117 lb 14.4 oz)  03/31/16 60.8 kg (134 lb)    Physical Exam:  BP (!) 154/43 (BP Location: Left Arm)   Pulse 61   Temp 98 F (36.7 C) (Oral)   Resp 18   SpO2 100%  Constitutional: She appearswell-developedand well-nourished.No distress.  HENT: Normocephalicand atraumatic.  Cardiac: RRR. No JVD. Respiratory:Effort normal breath sounds normal.  GI: Bowel sounds are normal. She exhibitsno distension.  Musculoskeletal: She exhibits noedemaor tenderness. Frozen left shoulder due to prior injury. Neurological: She isalertand oriented. Mild left facial weakness.  Speech clear but tangential needing redirection.  Able to follow simple motor commands.  Minimal LUE > LLE weakness.R delt weak due to MSK injury Skin: Skin iswarmand dry. She isnot diaphoretic.  Psychiatric: Her mood appears anxious. Her speech istangential.Verbose.   Assessment/Plan: 1. Functional deficits secondary to right periventricular white matter infarct which require 3+ hours per day of interdisciplinary therapy in a comprehensive inpatient rehab  setting. Physiatrist is providing close team supervision and 24 hour management of active medical problems listed below. Physiatrist and rehab team continue to assess barriers to discharge/monitor patient progress toward functional and medical goals.  Function:  Bathing Bathing position   Position: Shower  Bathing parts Body parts bathed by patient: Right arm, Left arm, Chest, Abdomen, Front perineal area, Buttocks, Right upper leg, Left upper leg, Right lower leg, Left lower leg Body parts bathed by helper: Back  Bathing assist Assist Level: Touching or steadying assistance(Pt > 75%)      Upper Body Dressing/Undressing Upper body dressing   What is the patient wearing?: Button up shirt         Button up shirt - Perfomed by patient: Thread/unthread right sleeve, Thread/unthread left sleeve, Button/unbutton shirt, Pull shirt around back Button up shirt - Perfomed by helper: Pull shirt around back    Upper body assist Assist Level: Supervision or verbal cues      Lower Body Dressing/Undressing Lower body dressing   What is the patient wearing?: Underwear, Pants, Non-skid slipper socks Underwear - Performed by patient: Thread/unthread right underwear leg, Thread/unthread left underwear leg, Pull underwear up/down   Pants- Performed by patient: Thread/unthread right pants leg, Thread/unthread left pants leg, Pull pants up/down   Non-skid slipper socks- Performed by patient: Don/doff right sock, Don/doff left sock Non-skid slipper socks- Performed by helper: Don/doff right sock, Don/doff left sock(helper assisted putting on clean socks)                  Lower body assist Assist for lower body dressing: Touching or steadying assistance (  Pt > 75%)      Toileting Toileting   Toileting steps completed by patient: Adjust clothing prior to toileting, Performs perineal hygiene, Adjust clothing after toileting Toileting steps completed by helper: Adjust clothing prior to  toileting, Performs perineal hygiene, Adjust clothing after toileting(per Candice, NT report) Toileting Assistive Devices: Grab bar or rail  Toileting assist Assist level: Touching or steadying assistance (Pt.75%)   Transfers Chair/bed transfer   Chair/bed transfer method: Stand pivot Chair/bed transfer assist level: Touching or steadying assistance (Pt > 75%) Chair/bed transfer assistive device: Armrests     Locomotion Ambulation     Max distance: 150 ft Assist level: Touching or steadying assistance (Pt > 75%)   Wheelchair   Type: Manual Max wheelchair distance: 10 Assist Level: Touching or steadying assistance (Pt > 75%)  Cognition Comprehension Comprehension assist level: Understands complex 90% of the time/cues 10% of the time  Expression Expression assist level: Expresses basic needs/ideas: With extra time/assistive device  Social Interaction Social Interaction assist level: Interacts appropriately with others with medication or extra time (anti-anxiety, antidepressant).  Problem Solving Problem solving assist level: Solves complex 90% of the time/cues < 10% of the time  Memory Memory assist level: Recognizes or recalls 90% of the time/requires cueing < 10% of the time    Medical Problem List and Plan: 1.Left hemiparesis and functional deficitssecondary to right periventricular white matter infarct   Continue CIR-PT,OT, team conference in a.m. 2. DVT Prophylaxis/Anticoagulation: Pharmaceutical:Lovenox 3. Pain Management:tylenol prn with local Mesures for chronic pain. 4. Mood:LCSW to follow for evaluation and support. 5. Neuropsych: This patientis not fullycapable of making decisions on herown behalf. 6. Skin/Wound Care:Routine pressure relief measures. Maintain adequate nutritional and hydration status. 7. Fluids/Electrolytes/Nutrition:Monitor I/O.  8.LabileHTN:Poorly controlled per records, been seen by nephrology --negative renal work up.Monitorqid  for now--continue lisinopril, hydralazine and tenormin. Off HCTZ at this time.Avoid hypotension. Vitals:   08/05/17 0253 08/05/17 1327  BP: (!) 160/72 (!) 154/43  Pulse: 68 61  Resp: 19 18  Temp: 97.8 F (36.6 C) 98 F (36.7 C)  SpO2: 100% 100%      Chlorthalidone started on 11/18, cont to monitor, consider change back to low dose HCTZ and possibly add clonidine patch if needed    9. GERD:Continue Protonix. 10. Hyponatremia:Chronic due to HCTZ?    Na+ 131 on 11/19   Cont to monitor 11. Prediabetic: Hgb A1c- 5.9.  12. Hypoalbuminemia   Supplement initiated 11/17 13. ABLA   Hb 10.5 on 11/17   Cont to monitor  LOS (Days) 4 A FACE TO FACE EVALUATION WAS PERFORMED  Erick Colacendrew E Aeris Hersman 08/05/2017 5:36 PM

## 2017-08-05 NOTE — Progress Notes (Signed)
Physical Therapy Session Note  Patient Details  Name: Debbie Hampton MRN: [id number] Date of Birth: 06-19-1932  Today's Date: 08/05/2017 PT Individual Time: 1515-1600 PT Individual Time Calculation (min): 45 min   Short Term Goals: Week 1:  PT Short Term Goal 1 (Week 1): Pt will increase bed mobility to mod I.  PT Short Term Goal 2 (Week 1): Pt will increase transfers to S.  PT Short Term Goal 3 (Week 1): Pt will increase ambulation with LRAD to c/s about 150 feet.  PT Short Term Goal 4 (Week 1): Pt will ascend/descend 4 stairs with B rails and min A.  PT Short Term Goal 5 (Week 1): Pt will propel w/c about 100 feet with B UEs and S.   Skilled Therapeutic Interventions/Progress Updates:    Pt seated in recliner upon PT arrival, agreeable to therapy tx and denies pain. Pt ambulated from room>gym x 150 ft with min assist using RW, verbal cues for step length and decreased WB through UEs, verbal cues to stand tall and look straight ahead.  Berg balance assessment completed this session, results detailed below and discussed results with the patient. Pt performed 2 x 10 hip abduction in standing with RW to work on L hip abductor strengthening. Pt performed x 10 sit<>stands throughout session with emphasis on symmetric LE WB and minimal UE use for LE strengthening. Pt transported back to room in w/c secondary to fatigue. Pt transferred w/c>bed stand pivot with min assist and sitting>supine with supervision. Pt left supine in bed with needs in reach.   Therapy Documentation Precautions:  Precautions Precautions: Fall Restrictions Weight Bearing Restrictions: No Balance: Standardized Balance Assessment Standardized Balance Assessment: Berg Balance Test Berg Balance Test Sit to Stand: Able to stand  independently using hands Standing Unsupported: Able to stand 2 minutes with supervision Sitting with Back Unsupported but Feet Supported on Floor or Stool: Able to sit safely and securely 2  minutes Stand to Sit: Uses backs of legs against chair to control descent Transfers: Able to transfer with verbal cueing and /or supervision Standing Unsupported with Eyes Closed: Able to stand 10 seconds with supervision Standing Ubsupported with Feet Together: Able to place feet together independently and stand for 1 minute with supervision From Standing, Reach Forward with Outstretched Arm: Can reach forward >5 cm safely (2") From Standing Position, Pick up Object from Floor: Unable to try/needs assist to keep balance(min assist) From Standing Position, Turn to Look Behind Over each Shoulder: Looks behind one side only/other side shows less weight shift Turn 360 Degrees: Able to turn 360 degrees safely but slowly Standing Unsupported, Alternately Place Feet on Step/Stool: Able to complete >2 steps/needs minimal assist Standing Unsupported, One Foot in Front: Needs help to step but can hold 15 seconds Standing on One Leg: Tries to lift leg/unable to hold 3 seconds but remains standing independently Total Score: 30   See Function Navigator for Current Functional Status.   Therapy/Group: Individual Therapy  Cresenciano GenreEmily van Schagen, PT, DPT 08/05/2017, 3:48 PM

## 2017-08-06 ENCOUNTER — Inpatient Hospital Stay (HOSPITAL_COMMUNITY): Payer: Medicare Other

## 2017-08-06 ENCOUNTER — Inpatient Hospital Stay (HOSPITAL_COMMUNITY): Payer: Medicare Other | Admitting: Physical Therapy

## 2017-08-06 MED ORDER — HYDROCHLOROTHIAZIDE 25 MG PO TABS: 25.0000 mg | ORAL_TABLET | Freq: Every day | ORAL | Status: DC

## 2017-08-06 MED ORDER — ARTIFICIAL TEARS OPHTHALMIC OINT
TOPICAL_OINTMENT | OPHTHALMIC | Status: DC | PRN
  Administered 2017-08-06 – 2017-08-10 (×7): via OPHTHALMIC
  Administered 2017-08-12: 1 via OPHTHALMIC
  Administered 2017-08-12 – 2017-08-13 (×2): via OPHTHALMIC
  Filled 2017-08-06: qty 3.5

## 2017-08-06 MED ORDER — HYDROCHLOROTHIAZIDE 25 MG PO TABS
25.0000 mg | ORAL_TABLET | Freq: Every day | ORAL | Status: DC
  Administered 2017-08-06 – 2017-08-09 (×4): 25 mg via ORAL
  Filled 2017-08-06 (×4): qty 1

## 2017-08-06 NOTE — Progress Notes (Signed)
[facility] PHYSICAL MEDICINE & REHABILITATION     PROGRESS NOTE  Subjective/Complaints:  No new complaints. Complimentary of team. Happy with her progress  ROS: pt denies nausea, vomiting, diarrhea, cough, shortness of breath or chest pain   Objective: Vital Signs: Blood pressure (!) 174/61, pulse 65, temperature 98.9 F (37.2 C), temperature source Oral, resp. rate 16, weight 53.5 kg (117 lb 15.1 oz), SpO2 100 %. No results found. No results for input(s): WBC, HGB, HCT, PLT in the last 72 hours. Recent Labs    08/04/17 0856  NA 131*  K 3.8  CL 97*  GLUCOSE 226*  BUN 22*  CREATININE 0.77  CALCIUM 9.3   CBG (last 3)  No results for input(s): GLUCAP in the last 72 hours.  Wt Readings from Last 3 Encounters:  08/06/17 53.5 kg (117 lb 15.1 oz)  07/29/17 53.5 kg (117 lb 14.4 oz)  03/31/16 60.8 kg (134 lb)    Physical Exam:  BP (!) 174/61 (BP Location: Left Arm)   Pulse 65   Temp 98.9 F (37.2 C) (Oral)   Resp 16   Wt 53.5 kg (117 lb 15.1 oz)   SpO2 100%   BMI 23.03 kg/m  Constitutional: She appearswell-developedand well-nourished.No distress.  HENT: Normocephalicand atraumatic.  Cardiac: RRR without murmur. No JVD . Respiratory:CTA Bilaterally without wheezes or rales. Normal effort .  GI: Bowel sounds are normal. She exhibitsno distension.  Musculoskeletal: She exhibits noedemaor tenderness. Frozen left shoulder due to prior injury. Neurological: She isalertand oriented. Mild left facial weakness. Clear speech.  Able to follow simple motor commands.  Sensation near normal Minimal LUE > LLE weakness.R delt weak due to MSK injury (shoulder) Skin: Skin iswarmand dry. She isnot diaphoretic.  Psychiatric: Her mood appears anxious. Her speech istangential.Verbose.   Assessment/Plan: 1. Functional deficits secondary to right periventricular white matter infarct which require 3+ hours per day of interdisciplinary therapy in a comprehensive  inpatient rehab setting. Physiatrist is providing close team supervision and 24 hour management of active medical problems listed below. Physiatrist and rehab team continue to assess barriers to discharge/monitor patient progress toward functional and medical goals.  Function:  Bathing Bathing position   Position: Shower  Bathing parts Body parts bathed by patient: Right arm, Left arm, Chest, Abdomen, Front perineal area, Buttocks, Right upper leg, Left upper leg, Right lower leg, Left lower leg Body parts bathed by helper: Back  Bathing assist Assist Level: Touching or steadying assistance(Pt > 75%)      Upper Body Dressing/Undressing Upper body dressing   What is the patient wearing?: Button up shirt         Button up shirt - Perfomed by patient: Thread/unthread right sleeve, Thread/unthread left sleeve, Button/unbutton shirt, Pull shirt around back Button up shirt - Perfomed by helper: Pull shirt around back    Upper body assist Assist Level: Supervision or verbal cues      Lower Body Dressing/Undressing Lower body dressing   What is the patient wearing?: Underwear, Pants, Non-skid slipper socks Underwear - Performed by patient: Thread/unthread right underwear leg, Thread/unthread left underwear leg, Pull underwear up/down   Pants- Performed by patient: Thread/unthread right pants leg, Thread/unthread left pants leg, Pull pants up/down   Non-skid slipper socks- Performed by patient: Don/doff right sock, Don/doff left sock Non-skid slipper socks- Performed by helper: Don/doff right sock, Don/doff left sock(helper assisted putting on clean socks)                  Lower  body assist Assist for lower body dressing: Touching or steadying assistance (Pt > 75%)      Toileting Toileting   Toileting steps completed by patient: Adjust clothing prior to toileting, Performs perineal hygiene, Adjust clothing after toileting Toileting steps completed by helper: Adjust clothing  prior to toileting, Performs perineal hygiene, Adjust clothing after toileting(per Candice, NT report) Toileting Assistive Devices: Grab bar or rail  Toileting assist Assist level: Touching or steadying assistance (Pt.75%)   Transfers Chair/bed transfer   Chair/bed transfer method: Stand pivot, Ambulatory Chair/bed transfer assist level: Touching or steadying assistance (Pt > 75%) Chair/bed transfer assistive device: Armrests     Locomotion Ambulation     Max distance: 150 ft Assist level: Touching or steadying assistance (Pt > 75%)   Wheelchair   Type: Manual Max wheelchair distance: 10 Assist Level: Touching or steadying assistance (Pt > 75%)  Cognition Comprehension Comprehension assist level: Understands complex 90% of the time/cues 10% of the time  Expression Expression assist level: Expresses basic needs/ideas: With extra time/assistive device  Social Interaction Social Interaction assist level: Interacts appropriately with others with medication or extra time (anti-anxiety, antidepressant).  Problem Solving Problem solving assist level: Solves complex 90% of the time/cues < 10% of the time  Memory Memory assist level: Recognizes or recalls 90% of the time/requires cueing < 10% of the time    Medical Problem List and Plan: 1.Left hemiparesis and functional deficitssecondary to right periventricular white matter infarct   Continue CIR-PT,OT, team conference today. 2. DVT Prophylaxis/Anticoagulation: Pharmaceutical:Lovenox 3. Pain Management:tylenol prn with local Mesures for chronic pain. 4. Mood:LCSW to follow for evaluation and support. 5. Neuropsych: This patientis not fullycapable of making decisions on herown behalf. 6. Skin/Wound Care:Routine pressure relief measures. Maintain adequate nutritional and hydration status. 7. Fluids/Electrolytes/Nutrition:Monitor I/O.  8.LabileHTN:Poorly controlled per records, been seen by nephrology --negative renal  work up.Monitorqid for now--continue lisinopril, hydralazine and tenormin.    Vitals:   08/05/17 1327 08/06/17 0220  BP: (!) 154/43 (!) 174/61  Pulse: 61 65  Resp: 18 16  Temp: 98 F (36.7 C) 98.9 F (37.2 C)  SpO2: 100% 100%      Chlorthalidone started on 11/18, change back to HCTZ    =consider adding clonidine patch if needed    9. GERD:Continue Protonix. 10. Hyponatremia:Chronic due to HCTZ?    Na+ 131 on 11/19   Cont to monitor with resumption of HCTZ 11. Prediabetic: Hgb A1c- 5.9.  12. Hypoalbuminemia   Supplement initiated 11/17 13. ABLA   Hb 10.5 on 11/17   Cont to monitor  LOS (Days) 5 A FACE TO FACE EVALUATION WAS PERFORMED  SWARTZ,ZACHARY T 08/06/2017 9:11 AM

## 2017-08-06 NOTE — Progress Notes (Signed)
Occupational Therapy Note  Patient Details  Name: Debbie BalesCarol Hampton MRN: [id number] Date of Birth: 01/04/1932  Today's Date: 08/06/2017 OT Individual Time: 1345-1430 OT Individual Time Calculation (min): 45 min   Pt denies pain Individual Therapy  Focus on sit<>stand and standing balance,  Pt performed sit>stand with RW X 5, sit<>stand with BUE on knees X 5, and sit<>stand with BUE crossed over chest X 5.  Pt also performed half squats-3 X 8.  Pt returned to room and amb with RW from doorway to recliner.  Pt remained in recliner with all needs within reach.    Lavone NeriLanier, Danali Marinos [facility] 08/06/2017, 3:01 PM

## 2017-08-06 NOTE — Progress Notes (Signed)
Physical Therapy Session Note  Patient Details  Name: Debbie Hampton MRN: [id number] Date of Birth: [birth year]  Today's Date: 08/06/2017 PT Individual Time: 0935-1100 PT Individual Time Calculation (min): 85 min   Short Term Goals: Week 1:  PT Short Term Goal 1 (Week 1): Pt will increase bed mobility to mod I.  PT Short Term Goal 2 (Week 1): Pt will increase transfers to S.  PT Short Term Goal 3 (Week 1): Pt will increase ambulation with LRAD to c/s about 150 feet.  PT Short Term Goal 4 (Week 1): Pt will ascend/descend 4 stairs with B rails and min A.  PT Short Term Goal 5 (Week 1): Pt will propel w/c about 100 feet with B UEs and S.   Skilled Therapeutic Interventions/Progress Updates: Pt presented in bed agreeable to therapy Pt performed supine to sit bed flat with use of bed rail and mod I. Pt ambulated to shower and performed bathing activities with supervision except minA for back Pt dressed with supervision and increased time. Pt ambulated to rehab gym min guard fade to supervision. Performed balance activities including peg board with single LE on 4in step and coordination target touches no AD. Pt continues to demonstrate decreased ankle strategies with moderate challenges. Performed sit to/from stand with no AD with pt demonstrating improved wt shifting. Pt ascend/descend x 8 steps with 1 rail with step to pattern, pt noted to have better control ascending with LLE vs RLE. Pt returned to room close supervision with left in recliner with call bell within reach and needs met.      Therapy Documentation Precautions:  Precautions Precautions: Fall Restrictions Weight Bearing Restrictions: No General:   Vital Signs: Therapy Vitals Temp: 98.7 F (37.1 C) Temp Source: Oral Pulse Rate: (!) 57 Resp: 18 BP: (!) 149/55 Patient Position (if appropriate): Sitting Oxygen Therapy SpO2: 99 % O2 Device: Not Delivered Pain: Pain Assessment Pain Assessment: No/denies pain   See  Function Navigator for Current Functional Status.   Therapy/Group: Individual Therapy  Debbie Hampton  Delane Stalling, PTA  08/06/2017, 3:56 PM

## 2017-08-06 NOTE — Progress Notes (Signed)
Speech Language Pathology Daily Session Note  Patient Details  Name: Debbie BalesCarol Osso MRN: [id number] Date of Birth: [birth year]  Today's Date: 08/06/2017 SLP Individual Time: 1300-1345 SLP Individual Time Calculation (min): 45 min  Short Term Goals: Week 1: SLP Short Term Goal 1 (Week 1): STG=LTG due to ELOS   Skilled Therapeutic Interventions: Skilled ST services focused on cognitive skills. SLP facilitated medication management utilizing medication list and pill organizer.Pt demonstrated ability to recall current medication utilizing visual aid,list, and fill pill organizer with min- supervision verbal cues.Pt demonstrated reduced attention with medication prescribed for 3 times a day, however provided logical reasoning. Pt was unable to complete medication management due to time restraint and mod-min A verbal cues required for redirection to task. Pt was left in room with call bell within reach. Recommend to continue skilled ST services.      Function:  Eating Eating                 Cognition Comprehension Comprehension assist level: Understands complex 90% of the time/cues 10% of the time  Expression   Expression assist level: Expresses basic needs/ideas: With extra time/assistive device  Social Interaction Social Interaction assist level: Interacts appropriately with others with medication or extra time (anti-anxiety, antidepressant).  Problem Solving Problem solving assist level: Solves basic problems with no assist  Memory Memory assist level: Recognizes or recalls 90% of the time/requires cueing < 10% of the time    Pain Pain Assessment Pain Assessment: No/denies pain  Therapy/Group: Individual Therapy  Douglass Dunshee  [facility]CRATCH 08/06/2017, 2:45 PM

## 2017-08-06 NOTE — Patient Care Conference (Signed)
Inpatient RehabilitationTeam Conference and Plan of Care Update Date: 08/06/2017   Time: 9:25 AM    Patient Name: Debbie BalesCarol Penna      Medical Record Number: [id number]  Date of Birth: [birth year] Sex: Female         Room/Bed: 4W21C/4W21C-01 Payor Info: Payor: [employer]vertising copywriter[employer] HEALTHCARE MEDICARE / Plan: [employer] MEDICARE / Product Type: *No Product type* /    Admitting Diagnosis: R CVA  Admit Date/Time:  08/01/2017  4:37 PM Admission Comments: No comment available   Primary Diagnosis:  <principal problem not specified> Principal Problem: <principal problem not specified>  Patient Active Problem List   Diagnosis Date Noted  . Labile blood pressure   . Benign essential HTN   . Hypertensive crisis   . Hyponatremia   . Prediabetes   . Hypoalbuminemia due to protein-calorie malnutrition (HCC)   . Acute blood loss anemia   . 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 Acute CVA (cerebrovascular accident) (HCC) 07/29/2017  . Syncope, near 03/31/2016    Expected Discharge Date: Expected Discharge Date: 08/13/17  Team Members Present: Physician leading conference: Dr. Maryla MorrowAnkit Patel Social Worker Present: Dossie DerBecky Boluwatife Flight, LCSW Nurse Present: Kennyth ArnoldStacey Jennings, RN PT Present: Harless Littenosita Dechalus, PTA OT Present: Callie FieldingKatie Pittman, OT SLP Present: Colin BentonMadison Cratch, SLP PPS Coordinator present : Tora DuckMarie Noel, RN, CRRN     Current Status/Progress Goal Weekly Team Focus  Medical   right PVWM infarct with mild left hemiparesis. labile htn, hyponatremia  stabilize bp  bp regimen adjustment   Bowel/Bladder   continent of bowel & bladder, LBM 08/06/17  remain continent  moniter & assist as needed   Swallow/Nutrition/ Hydration             ADL's   Min A overall  Mod I overall, supervision for shower transfers and IADLs  L Neuro re-ed, ADL re-training, functional balance, d/c planning   Mobility   supervision transfers, gait up to 11050ft with min guard  mod I transfers, gait, w/c mobility, S stairs  L NMR, balance, gait,  endurance   Communication             Safety/Cognition/ Behavioral Observations  Supervision for semi-complex problem solving, selective attention and anticipatory awareness  Mod I  medication and money management, anticipatory awareness and selective attention, education   Pain   occasionally c/o headache or soreness, has tylenol prn  pain scale <4  continue to assess & treat as needed   Skin   abrasion to left knee & abdominal bruising/ no other skin issues  no new areas of skin breakdown         *See Care Plan and progress notes for long and short-term goals.     Barriers to Discharge  Current Status/Progress Possible Resolutions Date Resolved   Physician    Medical stability        adjusting bp regimen,normalize bp      Nursing  Decreased caregiver support;Lack of/limited family support  Husband has Dementia and going into SNF            PT                    OT                  SLP Decreased caregiver support              SW Decreased caregiver support Will need to evaluate if pt needs caregiver at discharge from rehab  Discharge Planning/Teaching Needs:    Home with intermittent assist but can have 24 hr if needed. Husband place din memory care and being cared for. Pt very determined to reach mod/i level     Team Discussion:  Goals mod/i-supervision level. Making good progress in therapies. MD watching sodium . Speech working on higher level medication and money Insurance account managermanagement.   Revisions to Treatment Plan:  DC 11/28    Continued Need for Acute Rehabilitation Level of Care: The patient requires daily medical management by a physician with specialized training in physical medicine and rehabilitation for the following conditions: Daily direction of a multidisciplinary physical rehabilitation program to ensure safe treatment while eliciting the highest outcome that is of practical value to the patient.: Yes Daily medical management of patient stability for  increased activity during participation in an intensive rehabilitation regime.: Yes Daily analysis of laboratory values and/or radiology reports with any subsequent need for medication adjustment of medical intervention for : Neurological problems;Blood pressure problems  Wilkin Lippy, Lemar LivingsRebecca G 08/06/2017, 2:03 PM

## 2017-08-06 NOTE — Progress Notes (Signed)
Social Work [name], female   DOB: 1932/08/29, 83 y.o.   MRN: [id number]  Met with pt and spoke with son-[name] via telephone to discuss team conference goals of mod/i-supervision level and target discharge 11/28. Both are very pleased with how well she is doing and the progress she is making. Pt is very determined to get to the level she can be home alone. He will be in on Friday will touch base with him then.

## 2017-08-07 MED ORDER — DICLOFENAC SODIUM 1 % TD GEL
2.0000 g | Freq: Three times a day (TID) | TRANSDERMAL | Status: DC
  Administered 2017-08-07 – 2017-08-13 (×20): 2 g via TOPICAL
  Filled 2017-08-07: qty 100

## 2017-08-07 NOTE — Progress Notes (Signed)
[facility] PHYSICAL MEDICINE & REHABILITATION     PROGRESS NOTE  Subjective/Complaints:  Reports some pain in both of her shoulders as well as her lower neck.  Also having some discomfort around the left lateral foot.  Heating pad helps  ROS: pt denies nausea, vomiting, diarrhea, cough, shortness of breath or chest pain   Objective: Vital Signs: Blood pressure (!) 161/51, pulse 62, temperature 98.6 F (37 C), temperature source Oral, resp. rate 17, weight 53.5 kg (117 lb 15.1 oz), SpO2 98 %. No results found. No results for input(s): WBC, HGB, HCT, PLT in the last 72 hours. No results for input(s): NA, K, CL, GLUCOSE, BUN, CREATININE, CALCIUM in the last 72 hours.  Invalid input(s): CO CBG (last 3)  No results for input(s): GLUCAP in the last 72 hours.  Wt Readings from Last 3 Encounters:  08/06/17 53.5 kg (117 lb 15.1 oz)  07/29/17 53.5 kg (117 lb 14.4 oz)  03/31/16 60.8 kg (134 lb)    Physical Exam:  BP (!) 161/51 (BP Location: Right Arm)   Pulse 62   Temp 98.6 F (37 C) (Oral)   Resp 17   Wt 53.5 kg (117 lb 15.1 oz)   SpO2 98%   BMI 23.03 kg/m  Constitutional: She appearswell-developedand well-nourished.No distress.  HENT: Normocephalicand atraumatic.  Cardiac: Regular rate and rhythm without JVD Respiratory:Clear to auscultation bilaterally GI: Bowel sounds are normal. She exhibitsno distension.  Musculoskeletal: Both shoulders, left more than right are tender with active and passive range of motion.  I examined her left foot and there are no signs of bruising edema warmth or discoloration neurological: She isalertand oriented. Mild left facial weakness. Clear speech.  Able to follow simple motor commands.  Sensation near normal Minimal LUE > LLE weakness.R delt weak due to MSK injury (shoulder) Skin: Skin iswarmand dry. She isnot diaphoretic.  Psychiatric: Her mood appears anxious. Her speech istangential.Verbose.   Assessment/Plan: 1.  Functional deficits secondary to right periventricular white matter infarct which require 3+ hours per day of interdisciplinary therapy in a comprehensive inpatient rehab setting. Physiatrist is providing close team supervision and 24 hour management of active medical problems listed below. Physiatrist and rehab team continue to assess barriers to discharge/monitor patient progress toward functional and medical goals.  Function:  Bathing Bathing position   Position: Shower  Bathing parts Body parts bathed by patient: Right arm, Left arm, Chest, Abdomen, Front perineal area, Buttocks, Right upper leg, Left upper leg, Right lower leg, Left lower leg Body parts bathed by helper: Back  Bathing assist Assist Level: Touching or steadying assistance(Pt > 75%)      Upper Body Dressing/Undressing Upper body dressing   What is the patient wearing?: Button up shirt         Button up shirt - Perfomed by patient: Thread/unthread right sleeve, Thread/unthread left sleeve, Button/unbutton shirt, Pull shirt around back Button up shirt - Perfomed by helper: Pull shirt around back    Upper body assist Assist Level: Supervision or verbal cues      Lower Body Dressing/Undressing Lower body dressing   What is the patient wearing?: Underwear, Pants, Non-skid slipper socks Underwear - Performed by patient: Thread/unthread right underwear leg, Thread/unthread left underwear leg, Pull underwear up/down   Pants- Performed by patient: Thread/unthread right pants leg, Thread/unthread left pants leg, Pull pants up/down   Non-skid slipper socks- Performed by patient: Don/doff right sock, Don/doff left sock Non-skid slipper socks- Performed by helper: Don/doff right sock, Don/doff left sock(helper  assisted putting on clean socks)                  Lower body assist Assist for lower body dressing: Touching or steadying assistance (Pt > 75%)      Toileting Toileting   Toileting steps completed by  patient: Adjust clothing prior to toileting, Performs perineal hygiene, Adjust clothing after toileting Toileting steps completed by helper: Adjust clothing prior to toileting, Performs perineal hygiene, Adjust clothing after toileting(per Candice, NT report) Toileting Assistive Devices: Grab bar or rail  Toileting assist Assist level: Touching or steadying assistance (Pt.75%)   Transfers Chair/bed transfer   Chair/bed transfer method: Stand pivot, Ambulatory Chair/bed transfer assist level: Touching or steadying assistance (Pt > 75%) Chair/bed transfer assistive device: Armrests     Locomotion Ambulation     Max distance: 150 ft Assist level: Touching or steadying assistance (Pt > 75%)   Wheelchair   Type: Manual Max wheelchair distance: 10 Assist Level: Touching or steadying assistance (Pt > 75%)  Cognition Comprehension Comprehension assist level: Understands complex 90% of the time/cues 10% of the time  Expression Expression assist level: Expresses basic needs/ideas: With extra time/assistive device  Social Interaction Social Interaction assist level: Interacts appropriately with others with medication or extra time (anti-anxiety, antidepressant).  Problem Solving Problem solving assist level: Solves basic problems with no assist  Memory Memory assist level: Recognizes or recalls 90% of the time/requires cueing < 10% of the time    Medical Problem List and Plan: 1.Left hemiparesis and functional deficitssecondary to right periventricular white matter infarct   Continue CIR-PT,OT, team conference today. 2. DVT Prophylaxis/Anticoagulation: Pharmaceutical:Lovenox 3. Pain Management:tylenol prn with local Mesures for chronic pain.    -Utilize K pad for pain control  -  -Add Voltaren gel for her shoulders.  She may also apply this to her left foot 4. Mood:LCSW to follow for evaluation and support. 5. Neuropsych: This patientis not fullycapable of making decisions on  herown behalf. 6. Skin/Wound Care:Routine pressure relief measures. Maintain adequate nutritional and hydration status. 7. Fluids/Electrolytes/Nutrition:Monitor I/O.  8.LabileHTN:Poorly controlled per records, been seen by nephrology --negative renal work up.Monitorqid for now--continue lisinopril, hydralazine and tenormin.    Vitals:   08/06/17 2038 08/07/17 0500  BP: (!) 114/51 (!) 161/51  Pulse:  62  Resp:  17  Temp:  98.6 F (37 C)  SpO2: 97% 98%      Chlorthalidone started on 11/18, changeD back to HCTZ with improved control    =consider adding clonidine patch if needed   Recheck labs tomorrow 9. GERD:Continue Protonix. 10. Hyponatremia:Chronic due to HCTZ?    Na+ 131 on 11/19   Cont to monitor with resumption of HCTZ 11. Prediabetic: Hgb A1c- 5.9.  12. Hypoalbuminemia   Supplement initiated 11/17 13. ABLA   Hb 10.5 on 11/17   Cont to monitor  LOS (Days) 6 A FACE TO FACE EVALUATION WAS PERFORMED  Remus Hagedorn T 08/07/2017 9:10 AM

## 2017-08-08 ENCOUNTER — Inpatient Hospital Stay (HOSPITAL_COMMUNITY): Payer: Medicare Other | Admitting: Physical Therapy

## 2017-08-08 ENCOUNTER — Inpatient Hospital Stay (HOSPITAL_COMMUNITY): Payer: Medicare Other | Admitting: Occupational Therapy

## 2017-08-08 ENCOUNTER — Inpatient Hospital Stay (HOSPITAL_COMMUNITY): Payer: Medicare Other | Admitting: Speech Pathology

## 2017-08-08 LAB — CBC
HCT: 35 % — ABNORMAL LOW (ref 36.0–46.0)
Hemoglobin: 11.6 g/dL — ABNORMAL LOW (ref 12.0–15.0)
MCH: 27.7 pg (ref 26.0–34.0)
MCHC: 33.1 g/dL (ref 30.0–36.0)
MCV: 83.5 fL (ref 78.0–100.0)
PLATELETS: 271 10*3/uL (ref 150–400)
RBC: 4.19 MIL/uL (ref 3.87–5.11)
RDW: 13.6 % (ref 11.5–15.5)
WBC: 4.3 10*3/uL (ref 4.0–10.5)

## 2017-08-08 LAB — BASIC METABOLIC PANEL
Anion gap: 8 (ref 5–15)
BUN: 21 mg/dL — ABNORMAL HIGH (ref 6–20)
CALCIUM: 9.4 mg/dL (ref 8.9–10.3)
CO2: 27 mmol/L (ref 22–32)
CREATININE: 0.61 mg/dL (ref 0.44–1.00)
Chloride: 95 mmol/L — ABNORMAL LOW (ref 101–111)
GFR calc non Af Amer: >60 mL/min (ref >60)
Glucose, Bld: 123 mg/dL — ABNORMAL HIGH (ref 65–99)
Potassium: 3.7 mmol/L (ref 3.5–5.1)
SODIUM: 130 mmol/L — AB (ref 135–145)

## 2017-08-08 NOTE — Progress Notes (Signed)
Physical Therapy Session Note  Patient Details  Name: Debbie BalesCarol Hoskin MRN: [id number] Date of Birth: [birth year]  Today's Date: 08/08/2017 PT Individual Time: 0900-1000 PT Individual Time Calculation (min): 60 min   Short Term Goals: Week 1:  PT Short Term Goal 1 (Week 1): Pt will increase bed mobility to mod I.  PT Short Term Goal 2 (Week 1): Pt will increase transfers to S.  PT Short Term Goal 3 (Week 1): Pt will increase ambulation with LRAD to c/s about 150 feet.  PT Short Term Goal 4 (Week 1): Pt will ascend/descend 4 stairs with B rails and min A.  PT Short Term Goal 5 (Week 1): Pt will propel w/c about 100 feet with B UEs and S.   Skilled Therapeutic Interventions/Progress Updates:  Pt received sitting in WC and agreeable to PT  Gait in room to bathroom x 5415ft with RW and supervision assist for continent toileting. Dressing to toilet with set up only and extra time. Min assist to stand from toilet. Hand hygiene with supervision assist. Pt able to don shoes and socks with supervision assist from PT.   [facility]   Gait training with RW x 1950ft and supervision assist. Min cues from PT for posture and step height. Additional gait x 7550ft and 15400ft without AD and min assist from PT, with light manual facilitation for improved weight shifting over the LLE, and improved control with R heel strike.   Nustep reciprocal movement and endurance training x 8 minutes with min cues from PT for proper ROM,. Symmetry of movement and proper speed.   Pt left in day room for OT group therapy.    Therapy Documentation Precautions:  Precautions Precautions: Fall Restrictions Weight Bearing Restrictions: No    Vital Signs: Therapy Vitals Temp: (!) 97.5 F (36.4 C) Temp Source: Oral Pulse Rate: (!) 54 Resp: 20 BP: (!) 168/67 Patient Position (if appropriate): Lying Oxygen Therapy SpO2: 100 % O2 Device: Not Delivered Pain: 0/10   See  Function Navigator for Current Functional Status.   Therapy/Group: Individual Therapy  Golden Popustin E Apostolos Blagg 08/08/2017, 3:36 PM

## 2017-08-08 NOTE — Progress Notes (Signed)
Speech Language Pathology Weekly Progress and Session Note  Patient Details  Name: Debbie BalesCarol Hampton MRN: [id number] Date of Birth: 05/07/1932  Beginning of progress report period:  August 02, 2017  End of progress report period: August 08, 2017    Short Term Goals: Week 1: SLP Short Term Goal 1 (Week 1): STG=LTG due to ELOS     New Short Term Goals: Week 2: SLP Short Term Goal 1 (Week 2): Continue working towards LTG of Mod I   Weekly Progress Updates:  Pt has made functional gains this reporting period and his making excellent progress towards mod I long term goals.  Pt is currently a supervision level for semi-complex cognitive tasks due to mild higher level cognitive deficits and has demonstrated improved attention to tasks and anticipatory awareness of deficits.  Pt would continue to benefit from skilled ST while inpatient in order to maximize functional independence and reduce burden of care prior to discharge.  Anticipate that pt may benefit from initial assist for medication and financial management at discharge.  Do not anticipate that pt will need ST follow up once discharged from hospital.     Intensity: Minumum of 1-2 x/day, 30 to 90 minutes Frequency: 3 to 5 out of 7 days Duration/Length of Stay: 7-10 days  Treatment/Interventions: Cognitive remediation/compensation;Cueing hierarchy;Functional tasks;Patient/family education    Sanna Porcaro, Melanee Spryicole L 08/08/2017, 3:54 PM

## 2017-08-08 NOTE — Progress Notes (Signed)
[facility] PHYSICAL MEDICINE & REHABILITATION     PROGRESS NOTE  Subjective/Complaints:  Voltaren gel helped her shoulder pain.  Her left foot is feeling better as well.  She did have some concerns about a rash released history of rash along her left medial foot  ROS: pt denies nausea, vomiting, diarrhea, cough, shortness of breath or chest pain   Objective: Vital Signs: Blood pressure (!) 150/55, pulse 61, temperature 97.9 F (36.6 C), temperature source Oral, resp. rate 18, weight 53.5 kg (117 lb 15.1 oz), SpO2 97 %. No results found. Recent Labs    08/08/17 0807  WBC 4.3  HGB 11.6*  HCT 35.0*  PLT 271   Recent Labs    08/08/17 0807  NA 130*  K 3.7  CL 95*  GLUCOSE 123*  BUN 21*  CREATININE 0.61  CALCIUM 9.4   CBG (last 3)  No results for input(s): GLUCAP in the last 72 hours.  Wt Readings from Last 3 Encounters:  08/06/17 53.5 kg (117 lb 15.1 oz)  07/29/17 53.5 kg (117 lb 14.4 oz)  03/31/16 60.8 kg (134 lb)    Physical Exam:  BP (!) 150/55 (BP Location: Right Arm)   Pulse 61   Temp 97.9 F (36.6 C) (Oral)   Resp 18   Wt 53.5 kg (117 lb 15.1 oz)   SpO2 97%   BMI 23.03 kg/m  Constitutional: She appearswell-developedand well-nourished.No distress.  HENT: Normocephalicand atraumatic.  Cardiac: Regular rate and rhythm without JVD Respiratory:Clear to auscultation bilaterally GI: Bowel sounds are normal. She exhibitsno distension.  Musculoskeletal: Shoulder is less tender with active movement today.  I examined her left foot and there are no signs of bruising edema warmth or discoloration neurological: She isalertand oriented. Mild left facial weakness. Clear speech.  Able to follow simple motor commands.  Sensation near normal Minimal LUE > LLE weakness.R delt weak due to MSK injury (shoulder) Skin: Skin iswarmand dry. She isnot diaphoretic.  Psychiatric: Remains verbose and circumferential   Assessment/Plan: 1. Functional deficits  secondary to right periventricular white matter infarct which require 3+ hours per day of interdisciplinary therapy in a comprehensive inpatient rehab setting. Physiatrist is providing close team supervision and 24 hour management of active medical problems listed below. Physiatrist and rehab team continue to assess barriers to discharge/monitor patient progress toward functional and medical goals.  Function:  Bathing Bathing position   Position: Shower  Bathing parts Body parts bathed by patient: Right arm, Left arm, Chest, Abdomen, Front perineal area, Buttocks, Right upper leg, Left upper leg, Right lower leg, Left lower leg Body parts bathed by helper: Back  Bathing assist Assist Level: Touching or steadying assistance(Pt > 75%)      Upper Body Dressing/Undressing Upper body dressing   What is the patient wearing?: Button up shirt         Button up shirt - Perfomed by patient: Thread/unthread right sleeve, Thread/unthread left sleeve, Button/unbutton shirt, Pull shirt around back Button up shirt - Perfomed by helper: Pull shirt around back    Upper body assist Assist Level: Supervision or verbal cues      Lower Body Dressing/Undressing Lower body dressing   What is the patient wearing?: Underwear, Pants, Non-skid slipper socks Underwear - Performed by patient: Thread/unthread right underwear leg, Thread/unthread left underwear leg, Pull underwear up/down   Pants- Performed by patient: Thread/unthread right pants leg, Thread/unthread left pants leg, Pull pants up/down   Non-skid slipper socks- Performed by patient: Don/doff right sock, Don/doff left  sock Non-skid slipper socks- Performed by helper: Don/doff right sock, Don/doff left sock(helper assisted putting on clean socks)                  Lower body assist Assist for lower body dressing: Touching or steadying assistance (Pt > 75%)      Toileting Toileting   Toileting steps completed by patient: Adjust clothing  prior to toileting, Performs perineal hygiene, Adjust clothing after toileting Toileting steps completed by helper: Adjust clothing prior to toileting, Performs perineal hygiene, Adjust clothing after toileting(per Candice, NT report) Toileting Assistive Devices: Grab bar or rail  Toileting assist Assist level: Touching or steadying assistance (Pt.75%)   Transfers Chair/bed transfer   Chair/bed transfer method: Stand pivot, Ambulatory Chair/bed transfer assist level: Touching or steadying assistance (Pt > 75%) Chair/bed transfer assistive device: Armrests     Locomotion Ambulation     Max distance: 150 ft Assist level: Touching or steadying assistance (Pt > 75%)   Wheelchair   Type: Manual Max wheelchair distance: 10 Assist Level: Touching or steadying assistance (Pt > 75%)  Cognition Comprehension Comprehension assist level: Understands complex 90% of the time/cues 10% of the time  Expression Expression assist level: Expresses basic needs/ideas: With extra time/assistive device  Social Interaction Social Interaction assist level: Interacts appropriately with others with medication or extra time (anti-anxiety, antidepressant).  Problem Solving Problem solving assist level: Solves basic problems with no assist  Memory Memory assist level: Recognizes or recalls 90% of the time/requires cueing < 10% of the time    Medical Problem List and Plan: 1.Left hemiparesis and functional deficitssecondary to right periventricular white matter infarct   Continue CIR-PT,OT therapies  2. DVT Prophylaxis/Anticoagulation: Pharmaceutical:Lovenox 3. Pain Management:tylenol prn with local Mesures for chronic pain.    -Utilize K pad for pain control  -  -Added Voltaren gel for her shoulders.  She may also apply this to her left foot 4. Mood:LCSW to follow for evaluation and support. 5. Neuropsych: This patientis not fullycapable of making decisions on herown behalf. 6. Skin/Wound  Care:Routine pressure relief measures. Maintain adequate nutritional and hydration status. 7. Fluids/Electrolytes/Nutrition:Monitor I/O.  8.LabileHTN:Poorly controlled per records, been seen by nephrology --negative renal work up.Monitorqid for now--continue lisinopril, hydralazine and tenormin.    Vitals:   08/07/17 2110 08/08/17 0420  BP: (!) 172/53 (!) 150/55  Pulse:  61  Resp:  18  Temp:  97.9 F (36.6 C)  SpO2:  97%      Chlorthalidone started on 11/18, changed back to HCTZ with improved control as a whole    =consider adding clonidine patch if needed     9. GERD:Continue Protonix. 10. Hyponatremia:Chronic due to HCTZ?    Labs reviewed and sodium is 130 today.  Continue to follow serially, check labs in the morning   Cont to monitor with resumption of HCTZ 11. Prediabetic: Hgb A1c- 5.9.  12. Hypoalbuminemia   Supplement initiated 11/17 13. ABLA   Hb 10.5 on 11/17   Cont to monitor  LOS (Days) 7 A FACE TO FACE EVALUATION WAS PERFORMED  Gary Gabrielsen T 08/08/2017 9:38 AM

## 2017-08-08 NOTE — Progress Notes (Signed)
Speech Language Pathology Daily Session Note  Patient Details  Name: Jones BalesCarol Smejkal MRN: [id number] Date of Birth: [birth year]  Today's Date: 08/08/2017 SLP Individual Time: 1405-1505 SLP Individual Time Calculation (min): 60 min  Short Term Goals: Week 1: SLP Short Term Goal 1 (Week 1): STG=LTG due to ELOS   Skilled Therapeutic Interventions:  Pt was seen for skilled ST targeting cognitive goals.  Therapist facilitated the session with continued practice for medication management use a 4x daily pill box and a written list of pt's currently scheduled medications.  Pt needed min assist instructional cues to organize pills of varying dosages and frequencies into pill box which SLP suspects to be due to the novelty of task.  Discussed recommendations for using a pill box at home given that her previous medication regimen has been altered.  Pt verbalized understanding and was in agreement with recommendations. Pt was left in bed with bed alarm set and call bell within reach.   Continue per current plan of care.    Function:  Eating Eating                 Cognition Comprehension Comprehension assist level: Follows complex conversation/direction with extra time/assistive device  Expression   Expression assist level: Expresses complex ideas: With extra time/assistive device  Social Interaction Social Interaction assist level: Interacts appropriately with others with medication or extra time (anti-anxiety, antidepressant).  Problem Solving Problem solving assist level: Solves basic 90% of the time/requires cueing < 10% of the time  Memory Memory assist level: Recognizes or recalls 90% of the time/requires cueing < 10% of the time    Pain Pain Assessment Pain Assessment: No/denies pain  Therapy/Group: Individual Therapy  [place], Melanee SpryNicole L 08/08/2017, 3:48 PM

## 2017-08-08 NOTE — Progress Notes (Signed)
Occupational Therapy Session Note  Patient Details  Name: Jones BalesCarol Fetch MRN: [id number] Date of Birth: Dec 20, 1931  Today's Date: 08/08/2017 OT Group Time: 1000-1100 OT Group Time Calculation (min): 60 min    Short Term Goals: Week 1:  OT Short Term Goal 1 (Week 1): STG=LTG 2/2 ELOS Week 2:     Skilled Therapeutic Interventions/Progress Updates:    UE exercise with 2-4 lbs weights and orange theraband to focus on strengthening and cardiovascular endurance in group setting. Addressed all UE mm groups. Also perform stretching program. Provided theraband exercises pt can perform in their room as HEP. Pt able to self propel back to their room with distant supervision to mod I with extra time with bilateral UEs and LEs.     Therapy Documentation Precautions:  Precautions Precautions: Fall Restrictions Weight Bearing Restrictions: No GPain:  no reports of pain  See Function Navigator for Current Functional Status.   Therapy/Group: Group Therapy  Roney MansSmith, Jer[facility]ie Mancuso [facility] 08/08/2017, 2:49 PM

## 2017-08-09 ENCOUNTER — Inpatient Hospital Stay (HOSPITAL_COMMUNITY): Payer: Medicare Other | Admitting: Physical Therapy

## 2017-08-09 ENCOUNTER — Inpatient Hospital Stay (HOSPITAL_COMMUNITY): Payer: Medicare Other | Admitting: Occupational Therapy

## 2017-08-09 ENCOUNTER — Inpatient Hospital Stay (HOSPITAL_COMMUNITY): Payer: Medicare Other | Admitting: Speech Pathology

## 2017-08-09 LAB — BASIC METABOLIC PANEL
Anion gap: 8 (ref 5–15)
BUN: 32 mg/dL — AB (ref 6–20)
CHLORIDE: 95 mmol/L — AB (ref 101–111)
CO2: 26 mmol/L (ref 22–32)
CREATININE: 0.66 mg/dL (ref 0.44–1.00)
Calcium: 9.2 mg/dL (ref 8.9–10.3)
GFR calc Af Amer: >60 mL/min (ref >60)
GFR calc non Af Amer: >60 mL/min (ref >60)
GLUCOSE: 89 mg/dL (ref 65–99)
POTASSIUM: 4.4 mmol/L (ref 3.5–5.1)
SODIUM: 129 mmol/L — AB (ref 135–145)

## 2017-08-09 MED ORDER — BLISTEX MEDICATED EX OINT
TOPICAL_OINTMENT | CUTANEOUS | Status: DC | PRN
  Filled 2017-08-09: qty 6.3

## 2017-08-09 MED ORDER — CHLORTHALIDONE 25 MG PO TABS
25.0000 mg | ORAL_TABLET | Freq: Every day | ORAL | Status: DC
  Administered 2017-08-10: 25 mg via ORAL
  Filled 2017-08-09: qty 1

## 2017-08-09 NOTE — Progress Notes (Signed)
Speech Language Pathology Daily Session Note  Patient Details  Name: Jones BalesCarol Fishbaugh MRN: [id number] Date of Birth: 10-09-1931  Today's Date: 08/09/2017 SLP Individual Time: 1100-1200 SLP Individual Time Calculation (min): 60 min  Short Term Goals: Week 1: SLP Short Term Goal 1 (Week 1): STG=LTG due to ELOS   Skilled Therapeutic Interventions: Skilled treatment session focused on cognitive goals. SLP facilitated session by providing intermittent supervision verbal cues for problem solving during a mildly complex, schedule/calendar making task. Patient demonstrated sustained attention to task for ~30 minutes with Mod I. However, throughout functional conversation in regards to goals of therapy, patient was verbose and tangential. Patient left upright in recliner with all needs within reach. Continue with current plan of care.       Function:   Cognition Comprehension Comprehension assist level: Follows complex conversation/direction with extra time/assistive device  Expression   Expression assist level: Expresses complex ideas: With extra time/assistive device  Social Interaction Social Interaction assist level: Interacts appropriately with others with medication or extra time (anti-anxiety, antidepressant).  Problem Solving Problem solving assist level: Solves complex 90% of the time/cues < 10% of the time  Memory Memory assist level: Recognizes or recalls 90% of the time/requires cueing < 10% of the time    Pain No/Denies Pain   Therapy/Group: Individual Therapy  Sidnie Swalley 08/09/2017, 12:43 PM

## 2017-08-09 NOTE — Progress Notes (Signed)
Occupational Therapy Session Note  Patient Details  Name: Debbie BalesCarol Hampton MRN: [id number] Date of Birth: 18-Feb-1932  Today's Date: 08/09/2017 OT Individual Time: 1000-1100 OT Individual Time Calculation (min): 60 min    Short Term Goals: Week 1:  OT Short Term Goal 1 (Week 1): STG=LTG 2/2 ELOS Week 2:    Week 3:     Skilled Therapeutic Interventions/Progress Updates:    1:1 focus on self care retraining of just dressing sit to stand with setup. Focus on functional ambulation with RW in and out of bathroom with toileting with supervision. Continued focus on dynamic standing balance and functional mobility without RW. Pt ambulated from room to gym without AD at slow pace with min A.  At first pt was shuffling her feet but with repeition pt able to pick up feet and ambulate at fast (and safer) pace. In gym focus on sit to stands and stand to sit without UE support with control and VC for achieving more forward weight shift. Also performed modified OTAGO exercises in standing with UE support to challenge balance and balance reactions. Pt ambulated back to her room without AD with min A. Left resting in the recliner.   Therapy Documentation Precautions:  Precautions Precautions: Fall Restrictions Weight Bearing Restrictions: No Pain:  no c/o pain   See Function Navigator for Current Functional Status.   Therapy/Group: Individual Therapy  Roney MansSmith, Ruby Dilone [facility] 08/09/2017, 1:28 PM

## 2017-08-09 NOTE — Progress Notes (Signed)
[facility] PHYSICAL MEDICINE & REHABILITATION     PROGRESS NOTE  Subjective/Complaints:  Getting dry and request lip balm.  Also asked if she can go back on her Nexium when she goes home.  She can live with the Protonix for the short-termf  ROS: pt denies nausea, vomiting, diarrhea, cough, shortness of breath or chest pain   Objective: Vital Signs: Blood pressure (!) 153/62, pulse (!) 56, temperature 98.1 F (36.7 C), temperature source Oral, resp. rate 18, weight 53.5 kg (117 lb 15.1 oz), SpO2 99 %. No results found. Recent Labs    08/08/17 0807  WBC 4.3  HGB 11.6*  HCT 35.0*  PLT 271   Recent Labs    08/08/17 0807 08/09/17 0527  NA 130* 129*  K 3.7 4.4  CL 95* 95*  GLUCOSE 123* 89  BUN 21* 32*  CREATININE 0.61 0.66  CALCIUM 9.4 9.2   CBG (last 3)  No results for input(s): GLUCAP in the last 72 hours.  Wt Readings from Last 3 Encounters:  08/06/17 53.5 kg (117 lb 15.1 oz)  07/29/17 53.5 kg (117 lb 14.4 oz)  03/31/16 60.8 kg (134 lb)    Physical Exam:  BP (!) 153/62 (BP Location: Left Arm)   Pulse (!) 56   Temp 98.1 F (36.7 C) (Oral)   Resp 18   Wt 53.5 kg (117 lb 15.1 oz)   SpO2 99%   BMI 23.03 kg/m  Constitutional: She appearswell-developedand well-nourished.No distress. Oral mucosa is dry HENT: Normocephalicand atraumatic.  Cardiac:  Regular rate and rhythm Respiratory:Clear to auscultation bilaterally GI: Bowel sounds are normal. She exhibitsno distension.  Musculoskeletal: Shoulder is less tender with active movement today.  I examined her left foot and there are no signs of bruising edema warmth or discoloration neurological: She isalertand oriented. Mild left facial weakness. Clear speech.  Able to follow simple motor commands.  Sensation near normal Minimal LUE > LLE weakness.R delt weak due to MSK injury (shoulder) Skin: Skin iswarmand dry. She isnot diaphoretic.  Psychiatric: Remains verbose and  circumferential   Assessment/Plan: 1. Functional deficits secondary to right periventricular white matter infarct which require 3+ hours per day of interdisciplinary therapy in a comprehensive inpatient rehab setting. Physiatrist is providing close team supervision and 24 hour management of active medical problems listed below. Physiatrist and rehab team continue to assess barriers to discharge/monitor patient progress toward functional and medical goals.  Function:  Bathing Bathing position   Position: Shower  Bathing parts Body parts bathed by patient: Right arm, Left arm, Chest, Abdomen, Front perineal area, Buttocks, Right upper leg, Left upper leg, Right lower leg, Left lower leg Body parts bathed by helper: Back  Bathing assist Assist Level: Touching or steadying assistance(Pt > 75%)      Upper Body Dressing/Undressing Upper body dressing   What is the patient wearing?: Button up shirt         Button up shirt - Perfomed by patient: Thread/unthread right sleeve, Thread/unthread left sleeve, Button/unbutton shirt, Pull shirt around back Button up shirt - Perfomed by helper: Pull shirt around back    Upper body assist Assist Level: Supervision or verbal cues      Lower Body Dressing/Undressing Lower body dressing   What is the patient wearing?: Underwear, Pants, Non-skid slipper socks Underwear - Performed by patient: Thread/unthread right underwear leg, Thread/unthread left underwear leg, Pull underwear up/down   Pants- Performed by patient: Thread/unthread right pants leg, Thread/unthread left pants leg, Pull pants up/down  Non-skid slipper socks- Performed by patient: Don/doff right sock, Don/doff left sock Non-skid slipper socks- Performed by helper: Don/doff right sock, Don/doff left sock(helper assisted putting on clean socks)                  Lower body assist Assist for lower body dressing: Touching or steadying assistance (Pt > 75%)       Toileting Toileting   Toileting steps completed by patient: Adjust clothing prior to toileting, Performs perineal hygiene, Adjust clothing after toileting Toileting steps completed by helper: Adjust clothing prior to toileting, Performs perineal hygiene, Adjust clothing after toileting(per Candice, NT report) Toileting Assistive Devices: Grab bar or rail  Toileting assist Assist level: Supervision or verbal cues   Transfers Chair/bed transfer   Chair/bed transfer method: Stand pivot, Ambulatory Chair/bed transfer assist level: Touching or steadying assistance (Pt > 75%) Chair/bed transfer assistive device: Armrests     Locomotion Ambulation     Max distance: 150 ft Assist level: Touching or steadying assistance (Pt > 75%)   Wheelchair   Type: Manual Max wheelchair distance: 10 Assist Level: Touching or steadying assistance (Pt > 75%)  Cognition Comprehension Comprehension assist level: Follows complex conversation/direction with extra time/assistive device  Expression Expression assist level: Expresses complex ideas: With extra time/assistive device  Social Interaction Social Interaction assist level: Interacts appropriately with others with medication or extra time (anti-anxiety, antidepressant).  Problem Solving Problem solving assist level: Solves complex 90% of the time/cues < 10% of the time  Memory Memory assist level: Recognizes or recalls 90% of the time/requires cueing < 10% of the time    Medical Problem List and Plan: 1.Left hemiparesis and functional deficitssecondary to right periventricular white matter infarct   Continue CIR-PT,OT therapies  2. DVT Prophylaxis/Anticoagulation: Pharmaceutical:Lovenox 3. Pain Management:tylenol prn with local Mesures for chronic pain.    -Utilize K pad for pain control  -  -Added Voltaren gel for her shoulders.  She may also apply this to her left foot 4. Mood:LCSW to follow for evaluation and support. 5. Neuropsych:  This patientis not fullycapable of making decisions on herown behalf. 6. Skin/Wound Care:Routine pressure relief measures. Maintain adequate nutritional and hydration status. 7. Fluids/Electrolytes/Nutrition:Monitor I/O.  8.LabileHTN:Poorly controlled per records, been seen by nephrology --negative renal work up.Monitorqid for now--continue lisinopril, hydralazine and tenormin.    Vitals:   08/08/17 2207 08/09/17 0446  BP: (!) 152/54 (!) 153/62  Pulse:  (!) 56  Resp:  18  Temp:  98.1 F (36.7 C)  SpO2:  99%      Chlorthalidone started on 11/18, changed back to HCTZ  On 11/22 with better control overall but sodium has decrease in BUN has increased.  Therefore I will change back to the chlorthalidone beginning tomorrow    =consider adding clonidine sodium decreased to 129 today patch if needed     9. GERD:Continue Protonix. 10. Hyponatremia:Chronic due to HCTZ?    Marland Kitchen.  Stop HCTZ and resume chlorthalidone 11. Prediabetic: Hgb A1c- 5.9.  12. Hypoalbuminemia   Supplement initiated 11/17 13. ABLA   Hb 10.5 on 11/17   Cont to monitor  LOS (Days) 8 A FACE TO FACE EVALUATION WAS PERFORMED  Alban Marucci T 08/09/2017 12:29 PM   Has noticed that her lips are

## 2017-08-09 NOTE — Progress Notes (Signed)
Physical Therapy Weekly Progress Note  Patient Details  Name: Debbie Hampton MRN: [id number] Date of Birth: 07/03/1932  Beginning of progress report period: August 02, 2017 End of progress report period: August 09, 2017  Today's Date: 08/09/2017 PT Individual Time: 1310-1410 PT Individual Time Calculation (min): 60 min   Patient has met 3 of 5 short term goals. She is progressing well w/ functional mobility and is performing most mobility at a supervision level w/ RW. She requires Min A w/o AD or w/ SPC for safety and balance. Will continue to encourage use of LRAD that remains safest for patient at discharge.   Patient continues to demonstrate the following deficits muscle weakness, decreased cardiorespiratoy endurance, unbalanced muscle activation, decreased coordination and decreased motor planning and decreased standing balance, hemiplegia and decreased balance strategies and therefore will continue to benefit from skilled PT intervention to increase functional independence with mobility.  Patient progressing toward long term goals..  Continue plan of care.  PT Short Term Goals Week 1:  PT Short Term Goal 1 (Week 1): Pt will increase bed mobility to mod I.  PT Short Term Goal 1 - Progress (Week 1): Progressing toward goal PT Short Term Goal 2 (Week 1): Pt will increase transfers to S.  PT Short Term Goal 2 - Progress (Week 1): Met PT Short Term Goal 3 (Week 1): Pt will increase ambulation with LRAD to c/s about 150 feet.  PT Short Term Goal 3 - Progress (Week 1): Met PT Short Term Goal 4 (Week 1): Pt will ascend/descend 4 stairs with B rails and min A.  PT Short Term Goal 4 - Progress (Week 1): Met PT Short Term Goal 5 (Week 1): Pt will propel w/c about 100 feet with B UEs and S.  PT Short Term Goal 5 - Progress (Week 1): Progressing toward goal Week 2:  PT Short Term Goal 1 (Week 2): =LTGs due to ELOS  Skilled Therapeutic Interventions/Progress Updates:   Pt sitting EOB upon  arrival and agreeable to therapy, no c/o pain. Worked on gait training this session. Pt ambulated in 100-150' bouts around unit w/ seated rest breaks in between bouts. Used either RW, SPC, or no AD during gait w/ emphasis on fluid and more symmetrical gait pattern. Focused on fluidity of gait w/ RW and to decrease step-to gait pattern, supervision gait w/ RW. Had trials of gait w/o AD, Min A for balance and increased antalgic and step-to gait pattern w/o AD.  Instructed pt on using SPC for compensate for LLE deficits in gait, however she had increased difficulty decreasing her step-to and antalgic gait pattern while using SPC. Min A w/ SPC and manual facilitation of lateral weight shifting for more symmetrical gait pattern, verbal and visual cues for step pattern w/ cane. Returned to room ambulating w/ RW and supervision. Ended session in supine, call bell within reach and all needs met.   Therapy Documentation Precautions:  Precautions Precautions: Fall Restrictions Weight Bearing Restrictions: No Vital Signs: Therapy Vitals Temp: 97.7 F (36.5 C) Temp Source: Oral Pulse Rate: (!) 53 Resp: 18 BP: (!) 156/50 Patient Position (if appropriate): Sitting Oxygen Therapy SpO2: 99 % O2 Device: Not Delivered  See Function Navigator for Current Functional Status.  Therapy/Group: Individual Therapy   K Arnette 08/09/2017, 2:40 PM  

## 2017-08-10 LAB — BASIC METABOLIC PANEL
Anion gap: 8 (ref 5–15)
BUN: 33 mg/dL — ABNORMAL HIGH (ref 6–20)
CHLORIDE: 94 mmol/L — AB (ref 101–111)
CO2: 26 mmol/L (ref 22–32)
CREATININE: 0.71 mg/dL (ref 0.44–1.00)
Calcium: 9.1 mg/dL (ref 8.9–10.3)
GFR calc non Af Amer: >60 mL/min (ref >60)
GLUCOSE: 91 mg/dL (ref 65–99)
Potassium: 4.3 mmol/L (ref 3.5–5.1)
Sodium: 128 mmol/L — ABNORMAL LOW (ref 135–145)

## 2017-08-10 MED ORDER — CLONIDINE HCL 0.1 MG/24HR TD PTWK
0.1000 mg | MEDICATED_PATCH | TRANSDERMAL | Status: DC
  Filled 2017-08-10: qty 1

## 2017-08-10 NOTE — Progress Notes (Signed)
[place] PHYSICAL MEDICINE & REHABILITATION     PROGRESS NOTE  Subjective/Complaints:  No new issues overnight.  Appreciative of lip balm.   ROS: pt denies nausea, vomiting, diarrhea, cough, shortness of breath or chest pain    Objective: Vital Signs: Blood pressure (!) 164/64, pulse (!) 53, temperature 97.7 F (36.5 C), temperature source Axillary, resp. rate 18, weight 53.5 kg (117 lb 15.1 oz), SpO2 100 %. No results found. Recent Labs    08/08/17 0807  WBC 4.3  HGB 11.6*  HCT 35.0*  PLT 271   Recent Labs    08/09/17 0527 08/10/17 0621  NA 129* 128*  K 4.4 4.3  CL 95* 94*  GLUCOSE 89 91  BUN 32* 33*  CREATININE 0.66 0.71  CALCIUM 9.2 9.1   CBG (last 3)  No results for input(s): GLUCAP in the last 72 hours.  Wt Readings from Last 3 Encounters:  08/06/17 53.5 kg (117 lb 15.1 oz)  07/29/17 53.5 kg (117 lb 14.4 oz)  03/31/16 60.8 kg (134 lb)    Physical Exam:  BP (!) 164/64   Pulse (!) 53   Temp 97.7 F (36.5 C) (Axillary)   Resp 18   Wt 53.5 kg (117 lb 15.1 oz)   SpO2 100%   BMI 23.03 kg/m  Constitutional: She appearswell-developedand well-nourished.No distress. Oral mucosa is dry HENT: Normocephalicand atraumatic.  Cardiac:  RRR without murmur. No JVD  Respiratory:CTA Bilaterally without wheezes or rales. Normal effort  GI: Bowel sounds are normal. She exhibitsno distension.  Musculoskeletal: Shoulder is less tender with active movement today.  I examined her left foot and there are no signs of bruising edema warmth or discoloration neurological: She isalertand oriented. Mild left facial weakness. Clear speech.  Able to follow simple motor commands.  Sensation near normal Minimal LUE > LLE weakness.R delt weak due to MSK injury (shoulder) Skin: Skin iswarmand dry. She isnot diaphoretic.  Psychiatric: Remains verbose and circumferential   Assessment/Plan: 1. Functional deficits secondary to right periventricular white matter infarct  which require 3+ hours per day of interdisciplinary therapy in a comprehensive inpatient rehab setting. Physiatrist is providing close team supervision and 24 hour management of active medical problems listed below. Physiatrist and rehab team continue to assess barriers to discharge/monitor patient progress toward functional and medical goals.  Function:  Bathing Bathing position   Position: Shower  Bathing parts Body parts bathed by patient: Right arm, Left arm, Chest, Abdomen, Front perineal area, Buttocks, Right upper leg, Left upper leg, Right lower leg, Left lower leg Body parts bathed by helper: Back  Bathing assist Assist Level: Touching or steadying assistance(Pt > 75%)      Upper Body Dressing/Undressing Upper body dressing   What is the patient wearing?: Pull over shirt/dress     Pull over shirt/dress - Perfomed by patient: Thread/unthread right sleeve, Thread/unthread left sleeve, Put head through opening, Pull shirt over trunk   Button up shirt - Perfomed by patient: Thread/unthread right sleeve, Thread/unthread left sleeve, Button/unbutton shirt, Pull shirt around back Button up shirt - Perfomed by helper: Pull shirt around back    Upper body assist Assist Level: Set up      Lower Body Dressing/Undressing Lower body dressing   What is the patient wearing?: Underwear, Pants, Non-skid slipper socks, Socks, Shoes Underwear - Performed by patient: Thread/unthread right underwear leg, Thread/unthread left underwear leg, Pull underwear up/down   Pants- Performed by patient: Thread/unthread right pants leg, Thread/unthread left pants leg, Pull pants up/down  Non-skid slipper socks- Performed by patient: Don/doff right sock, Don/doff left sock Non-skid slipper socks- Performed by helper: Don/doff right sock, Don/doff left sock(helper assisted putting on clean socks) Socks - Performed by patient: Don/doff right sock, Don/doff left sock   Shoes - Performed by patient:  Don/doff right shoe, Don/doff left shoe, Fasten right, Fasten left            Lower body assist Assist for lower body dressing: Set up      Toileting Toileting   Toileting steps completed by patient: Adjust clothing prior to toileting, Performs perineal hygiene, Adjust clothing after toileting Toileting steps completed by helper: Adjust clothing prior to toileting, Performs perineal hygiene, Adjust clothing after toileting Toileting Assistive Devices: Grab bar or rail  Toileting assist Assist level: Supervision or verbal cues   Transfers Chair/bed transfer   Chair/bed transfer method: Stand pivot, Ambulatory Chair/bed transfer assist level: Supervision or verbal cues Chair/bed transfer assistive device: Armrests, Patent attorneyWalker     Locomotion Ambulation     Max distance: 100' Assist level: Touching or steadying assistance (Pt > 75%)   Wheelchair   Type: Manual Max wheelchair distance: 10 Assist Level: Touching or steadying assistance (Pt > 75%)  Cognition Comprehension Comprehension assist level: Follows complex conversation/direction with extra time/assistive device  Expression Expression assist level: Expresses complex ideas: With extra time/assistive device  Social Interaction Social Interaction assist level: Interacts appropriately with others with medication or extra time (anti-anxiety, antidepressant).  Problem Solving Problem solving assist level: Solves complex problems: With extra time  Memory Memory assist level: Recognizes or recalls 90% of the time/requires cueing < 10% of the time    Medical Problem List and Plan: 1.Left hemiparesis and functional deficitssecondary to right periventricular white matter infarct   Continue CIR-PT,OT therapies  2. DVT Prophylaxis/Anticoagulation: Pharmaceutical:Lovenox 3. Pain Management:tylenol prn with local Mesures for chronic pain.    -Utilize K pad for pain control  -  -Added Voltaren gel for her shoulders.  She may also  apply this to her left foot 4. Mood:LCSW to follow for evaluation and support. 5. Neuropsych: This patientis not fullycapable of making decisions on herown behalf. 6. Skin/Wound Care:Routine pressure relief measures. Maintain adequate nutritional and hydration status. 7. Fluids/Electrolytes/Nutrition:Monitor I/O.  8.LabileHTN:Poorly controlled per records, been seen by nephrology --negative renal work up.Monitorqid for now--continue lisinopril, hydralazine and tenormin.    Vitals:   08/10/17 0400 08/10/17 0507  BP: (!) 175/72 (!) 164/64  Pulse: 63 (!) 53  Resp:    Temp: 97.7 F (36.5 C)   SpO2: 100%    -Continued rise in BUN and creatinine as well as worsening hyponatremia.  HCTZ has  been changed back to [place] which I will go ahead and stop now as well.     -begin Clonidine patch 0.1 mg     9. GERD:Continue Protonix. 10. Hyponatremia:Chronic due to HCTZ/hygroton    .  See above     -Recheck labs tomorrow  11. Prediabetic: Hgb A1c- 5.9.  12. Hypoalbuminemia   Supplement initiated 11/17 13. ABLA   Hb 10.5 on 11/17   Cont to monitor  LOS (Days) 9 A FACE TO FACE EVALUATION WAS PERFORMED  Nilson Tabora T 08/10/2017 10:45 AM

## 2017-08-10 NOTE — Progress Notes (Signed)
08/10/17 1150 nursing BP120-130/43 per patient she feels sleepy. Patient refused the clonidine patch claims that when her bp goes lower than 145 systolic she can feel it. MD notified and he said to hold medication for now.

## 2017-08-11 ENCOUNTER — Inpatient Hospital Stay (HOSPITAL_COMMUNITY): Payer: Medicare Other

## 2017-08-11 ENCOUNTER — Inpatient Hospital Stay (HOSPITAL_COMMUNITY): Payer: Medicare Other | Admitting: Occupational Therapy

## 2017-08-11 LAB — BASIC METABOLIC PANEL
Anion gap: 5 (ref 5–15)
BUN: 26 mg/dL — ABNORMAL HIGH (ref 6–20)
CALCIUM: 9.2 mg/dL (ref 8.9–10.3)
CO2: 27 mmol/L (ref 22–32)
Chloride: 99 mmol/L — ABNORMAL LOW (ref 101–111)
Creatinine, Ser: 0.71 mg/dL (ref 0.44–1.00)
GFR calc Af Amer: >60 mL/min (ref >60)
GLUCOSE: 91 mg/dL (ref 65–99)
Potassium: 4.2 mmol/L (ref 3.5–5.1)
Sodium: 131 mmol/L — ABNORMAL LOW (ref 135–145)

## 2017-08-11 NOTE — Progress Notes (Addendum)
Physical Therapy Note  Patient Details  Name: Debbie Hampton MRN: [id number] Date of Birth: June 11, 1932 Today's Date: 08/11/2017  1300-1415, 75 min individual tx Pain: bil shoulder "sore ", unrated; pemedicated  Gait training in room with RW to /from toilet and sink with supervision.  Toilet transfer for continent voiding. Gait on level tile and carpet to day room. Pt's L pelvis noted to be higher than R in standing.  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 R rib hump noted in standing  neuromuscular re-education via forced use, multimodal cues for reciprocal R/L mass extension sitting in Kinetron at resistance 50.  For trunk shortening/lenthening for trunk righting standing on Kinetron at resistance 60.  Pt resistant to transferring full wt to RLE, but improved with practice.   PT trialed several different sizes heel lifts in R shoe and removed 2/3 inner soles in L shoe.  Gait training with shoe changes using grocery cart with pelvis more level.  PT advised pt to try 1/4" lift for a day and see how it feels. She was happy with white 1/4" lift in R shoe by end of session.  Blocked practice sit>< stand focusing on forward wt shift, reduced use of bil UEs.    Pt left resting in recliner with all needs within reach.  See function navigator for current status. Izetta Sakamoto 08/11/2017, 10:24 AM

## 2017-08-11 NOTE — Progress Notes (Signed)
[place] PHYSICAL MEDICINE & REHABILITATION     PROGRESS NOTE  Subjective/Complaints:   Nasal congestion , clearing throat no cough or SOB  ROS: pt denies nausea, vomiting, diarrhea, cough, shortness of breath or chest pain    Objective: Vital Signs: Blood pressure (!) 178/54, pulse (!) 57, temperature 97.8 F (36.6 C), temperature source Oral, resp. rate 18, weight 55 kg (121 lb 4.1 oz), SpO2 98 %. No results found. No results for input(s): WBC, HGB, HCT, PLT in the last 72 hours. Recent Labs    08/10/17 0621 08/11/17 0630  NA 128* 131*  K 4.3 4.2  CL 94* 99*  GLUCOSE 91 91  BUN 33* 26*  CREATININE 0.71 0.71  CALCIUM 9.1 9.2   CBG (last 3)  No results for input(s): GLUCAP in the last 72 hours.  Wt Readings from Last 3 Encounters:  08/11/17 55 kg (121 lb 4.1 oz)  07/29/17 53.5 kg (117 lb 14.4 oz)  03/31/16 60.8 kg (134 lb)    Physical Exam:  BP (!) 178/54 (BP Location: Left Arm)   Pulse (!) 57   Temp 97.8 F (36.6 C) (Oral)   Resp 18   Wt 55 kg (121 lb 4.1 oz)   SpO2 98%   BMI 23.68 kg/m  Constitutional: She appearswell-developedand well-nourished.No distress. Oral mucosa is dry HENT: Normocephalicand atraumatic.  Cardiac:  RRR without murmur. No JVD  Respiratory:CTA Bilaterally without wheezes or rales. Normal effort  GI: Bowel sounds are normal. She exhibitsno distension.  Musculoskeletal: Shoulder is less tender with active movement today.  I examined her left foot and there are no signs of bruising edema warmth or discoloration neurological: She isalertand oriented. Mild left facial weakness. Clear speech.  Able to follow simple motor commands.  Sensation near normal Minimal LUE > LLE weakness.R delt weak due to MSK injury (shoulder) Skin: Skin iswarmand dry. She isnot diaphoretic.  Psychiatric: Remains verbose and circumferential   Assessment/Plan: 1. Functional deficits secondary to right periventricular white matter infarct which  require 3+ hours per day of interdisciplinary therapy in a comprehensive inpatient rehab setting. Physiatrist is providing close team supervision and 24 hour management of active medical problems listed below. Physiatrist and rehab team continue to assess barriers to discharge/monitor patient progress toward functional and medical goals.  Function:  Bathing Bathing position   Position: Shower  Bathing parts Body parts bathed by patient: Right arm, Left arm, Chest, Abdomen, Front perineal area, Buttocks, Right upper leg, Left upper leg, Right lower leg, Left lower leg Body parts bathed by helper: Back  Bathing assist Assist Level: Touching or steadying assistance(Pt > 75%)      Upper Body Dressing/Undressing Upper body dressing   What is the patient wearing?: Pull over shirt/dress     Pull over shirt/dress - Perfomed by patient: Thread/unthread right sleeve, Thread/unthread left sleeve, Put head through opening, Pull shirt over trunk   Button up shirt - Perfomed by patient: Thread/unthread right sleeve, Thread/unthread left sleeve, Button/unbutton shirt, Pull shirt around back Button up shirt - Perfomed by helper: Pull shirt around back    Upper body assist Assist Level: Set up      Lower Body Dressing/Undressing Lower body dressing   What is the patient wearing?: Underwear, Pants, Non-skid slipper socks, Socks, Shoes Underwear - Performed by patient: Thread/unthread right underwear leg, Thread/unthread left underwear leg, Pull underwear up/down   Pants- Performed by patient: Thread/unthread right pants leg, Thread/unthread left pants leg, Pull pants up/down   Non-skid slipper  socks- Performed by patient: Don/doff right sock, Don/doff left sock Non-skid slipper socks- Performed by helper: Don/doff right sock, Don/doff left sock(helper assisted putting on clean socks) Socks - Performed by patient: Don/doff right sock, Don/doff left sock   Shoes - Performed by patient: Don/doff  right shoe, Don/doff left shoe, Fasten right, Fasten left            Lower body assist Assist for lower body dressing: Set up      Toileting Toileting   Toileting steps completed by patient: Adjust clothing prior to toileting, Performs perineal hygiene, Adjust clothing after toileting Toileting steps completed by helper: Adjust clothing prior to toileting, Performs perineal hygiene, Adjust clothing after toileting Toileting Assistive Devices: Grab bar or rail  Toileting assist Assist level: Supervision or verbal cues   Transfers Chair/bed transfer   Chair/bed transfer method: Stand pivot, Ambulatory Chair/bed transfer assist level: Supervision or verbal cues Chair/bed transfer assistive device: Armrests, Patent attorneyWalker     Locomotion Ambulation     Max distance: 100' Assist level: Touching or steadying assistance (Pt > 75%)   Wheelchair   Type: Manual Max wheelchair distance: 10 Assist Level: Touching or steadying assistance (Pt > 75%)  Cognition Comprehension Comprehension assist level: Follows complex conversation/direction with extra time/assistive device  Expression Expression assist level: Expresses complex ideas: With extra time/assistive device  Social Interaction Social Interaction assist level: Interacts appropriately with others with medication or extra time (anti-anxiety, antidepressant).  Problem Solving Problem solving assist level: Solves complex problems: With extra time  Memory Memory assist level: Recognizes or recalls 90% of the time/requires cueing < 10% of the time    Medical Problem List and Plan: 1.Left hemiparesis and functional deficitssecondary to right periventricular white matter infarct   Continue CIR-PT,OT therapies , strength improving 2. DVT Prophylaxis/Anticoagulation: Pharmaceutical:Lovenox 3. Pain Management:tylenol prn with local Mesures for chronic pain.    -Utilize K pad for pain control  -  -Added Voltaren gel for her shoulders.   She may also apply this to her left foot 4. Mood:LCSW to follow for evaluation and support. 5. Neuropsych: This patientis not fullycapable of making decisions on herown behalf. 6. Skin/Wound Care:Routine pressure relief measures. Maintain adequate nutritional and hydration status. 7. Fluids/Electrolytes/Nutrition:Monitor I/O.  8.LabileHTN:Poorly controlled per records, been seen by nephrology --negative renal work up.Monitorqid for now--continue lisinopril, hydralazine and tenormin.    Vitals:   08/11/17 0400 08/11/17 0745  BP: (!) 150/49 (!) 178/54  Pulse: (!) 55 (!) 57  Resp: 18 18  Temp: 97.9 F (36.6 C) 97.8 F (36.6 C)  SpO2: 98% 98%   -Continued rise in BUN and creatinine as well as worsening hyponatremia. Off thiazide diuretic, BP up this am now on catapres patch.  Monitor      9. GERD:Continue Protonix. 10. Hyponatremia:Chronic due to HCTZ/hygroton    .  See above     -Recheck labs tomorrow  11. Prediabetic: Hgb A1c- 5.9.  12. Hypoalbuminemia   Supplement initiated 11/17 13. ABLA   Hb 10.5 on 11/17   Cont to monitor 14.  Nasal congestion will monitor pt declines Claritin LOS (Days) 10 A FACE TO FACE EVALUATION WAS PERFORMED  Erick Colacendrew E Oliana Gowens 08/11/2017 8:56 AM

## 2017-08-11 NOTE — Progress Notes (Signed)
Occupational Therapy Session Note  Patient Details  Name: Debbie BalesCarol Hampton MRN: [id number] Date of Birth: 02-15-1932  Today's Date: 08/11/2017 OT Individual Time: 1000-1100 OT Individual Time Calculation (min): 60 min    Short Term Goals: Week 1:  OT Short Term Goal 1 (Week 1): STG=LTG 2/2 ELOS  Skilled Therapeutic Interventions/Progress Updates:    Pt seen for OT session focusing on ADL and IADL re-training. Pt sitting up in recliner upon arrival, agreeable to tx session. She declined bathing task this morning. She voiced goal of home ambulation without AD at d/c. Therefore, had pt ambulate throughout room to gather clothing items and then ambulate into bathroom. She required guarding assist for safe ambulation without AD. She dressed with distant supervision sit<> stand. She then ambulated into bathroom and one posterior LOB epsidoe when completing clothing management requiring min-mod A to regain balance.  Attempted ambulation in hallway without AD, however, requiring min- occasional mod A for balance with very slow ambulation speed. Provided with walker and pt then ambulated at functional speed with supervision. Made recommendation to pt for use of RW both at home and in community, she voiced understanding and agreement with recommendation. Education provided regarding continuum of care and waiting for HH vs OP therapists to upgrade AD, she voiced understanding. In ADL apartment, completed bed making activity with emphasis on functional use of RW during tasks, completed with supervision and occasional cuing.  She then completed simulated laundry activity, able to reach to place and obtain items in front loading and top loading washer/dryer. Pt returned to room at end of session, left seated in recliner at end of session, all needs in reach.   Therapy Documentation Precautions:  Precautions Precautions: Fall Restrictions Weight Bearing Restrictions: No Pain:   No/ denies pain  See  Function Navigator for Current Functional Status.   Therapy/Group: Individual Therapy  Lewis, Saahas Hidrogo C 08/11/2017, 7:12 AM

## 2017-08-11 NOTE — Progress Notes (Signed)
Speech Language Pathology Daily Session Note  Patient Details  Name: Debbie Hampton MRN: [id number] Date of Birth: Jun 03, 1932  Today's Date: 08/11/2017 SLP Individual Time: 0900-1000 SLP Individual Time Calculation (min): 60 min  Short Term Goals: Week 1: SLP Short Term Goal 1 (Week 1): STG=LTG due to ELOS   Skilled Therapeutic Interventions: Skilled ST services focused on cognitive skills. SLP facilitated semi-complex problem solving, working memory, sustained attention and organization of novel information utilizing scheduling tasks, pt demonstrated problem solving, working memory, attention and organization of information at Johnson & JohnsonMod I level and extended time. Although, pt required supervision-Min A verbal cues when recognizing and correcting errors, suggesting continued impairment into insight. Pt was left in room with call bell within reach. Recommend to continue  Skilled ST services.      Function:  Eating Eating                 Cognition Comprehension Comprehension assist level: Follows complex conversation/direction with extra time/assistive device  Expression   Expression assist level: Expresses complex ideas: With extra time/assistive device  Social Interaction Social Interaction assist level: Interacts appropriately with others with medication or extra time (anti-anxiety, antidepressant).  Problem Solving Problem solving assist level: Solves complex problems: With extra time  Memory Memory assist level: Recognizes or recalls 90% of the time/requires cueing < 10% of the time    Pain Pain Assessment Pain Assessment: No/denies pain Pain Score: 0-No pain  Therapy/Group: Individual Therapy  [name]  [facility] HospitalCRATCH 08/11/2017, 12:53 PM

## 2017-08-12 ENCOUNTER — Inpatient Hospital Stay (HOSPITAL_COMMUNITY): Payer: Medicare Other | Admitting: Physical Therapy

## 2017-08-12 ENCOUNTER — Inpatient Hospital Stay (HOSPITAL_COMMUNITY): Payer: Medicare Other | Admitting: Speech Pathology

## 2017-08-12 ENCOUNTER — Inpatient Hospital Stay (HOSPITAL_COMMUNITY): Payer: Medicare Other | Admitting: Occupational Therapy

## 2017-08-12 DIAGNOSIS — M24512 Contracture, left shoulder: Secondary | ICD-10-CM

## 2017-08-12 NOTE — Progress Notes (Signed)
[place] PHYSICAL MEDICINE & REHABILITATION     PROGRESS NOTE  Subjective/Complaints:   Nasal congestion , clearing throat no cough or SOB  ROS: pt denies nausea, vomiting, diarrhea, cough, shortness of breath or chest pain    Objective: Vital Signs: Blood pressure (!) 172/60, pulse (!) 58, temperature 97.7 F (36.5 C), temperature source Oral, resp. rate 18, weight 53.7 kg (118 lb 6.2 oz), SpO2 99 %. No results found. No results for input(s): WBC, HGB, HCT, PLT in the last 72 hours. Recent Labs    08/10/17 0621 08/11/17 0630  NA 128* 131*  K 4.3 4.2  CL 94* 99*  GLUCOSE 91 91  BUN 33* 26*  CREATININE 0.71 0.71  CALCIUM 9.1 9.2   CBG (last 3)  No results for input(s): GLUCAP in the last 72 hours.  Wt Readings from Last 3 Encounters:  08/12/17 53.7 kg (118 lb 6.2 oz)  07/29/17 53.5 kg (117 lb 14.4 oz)  03/31/16 60.8 kg (134 lb)    Physical Exam:  BP (!) 172/60 (BP Location: Left Arm)   Pulse (!) 58   Temp 97.7 F (36.5 C) (Oral)   Resp 18   Wt 53.7 kg (118 lb 6.2 oz)   SpO2 99%   BMI 23.12 kg/m  Constitutional: She appearswell-developedand well-nourished.No distress. Oral mucosa is dry HENT: Normocephalicand atraumatic.  Cardiac:  RRR without murmur. No JVD  Respiratory:CTA Bilaterally without wheezes or rales. Normal effort  GI: Bowel sounds are normal. She exhibitsno distension.  Musculoskeletal: Shoulder is less tender with active movement today.  I examined her left foot and there are no signs of bruising edema warmth or discoloration neurological: She isalertand oriented. Mild left facial weakness. Clear speech.  Able to follow simple motor commands.  Sensation near normal Minimal LUE > LLE weakness.R delt weak due to MSK injury (shoulder) Skin: Skin iswarmand dry. She isnot diaphoretic.  Psychiatric: Remains verbose and circumferential   Assessment/Plan: 1. Functional deficits secondary to right periventricular white matter infarct  which require 3+ hours per day of interdisciplinary therapy in a comprehensive inpatient rehab setting. Physiatrist is providing close team supervision and 24 hour management of active medical problems listed below. Physiatrist and rehab team continue to assess barriers to discharge/monitor patient progress toward functional and medical goals.  Function:  Bathing Bathing position   Position: Shower  Bathing parts Body parts bathed by patient: Right arm, Left arm, Chest, Abdomen, Front perineal area, Buttocks, Right upper leg, Left upper leg, Right lower leg, Left lower leg Body parts bathed by helper: Back  Bathing assist Assist Level: Touching or steadying assistance(Pt > 75%)      Upper Body Dressing/Undressing Upper body dressing   What is the patient wearing?: Pull over shirt/dress     Pull over shirt/dress - Perfomed by patient: Thread/unthread right sleeve, Thread/unthread left sleeve, Put head through opening, Pull shirt over trunk   Button up shirt - Perfomed by patient: Thread/unthread right sleeve, Thread/unthread left sleeve, Button/unbutton shirt, Pull shirt around back Button up shirt - Perfomed by helper: Pull shirt around back    Upper body assist Assist Level: Set up      Lower Body Dressing/Undressing Lower body dressing   What is the patient wearing?: Underwear, Pants, Non-skid slipper socks, Socks, Shoes Underwear - Performed by patient: Thread/unthread right underwear leg, Thread/unthread left underwear leg, Pull underwear up/down   Pants- Performed by patient: Thread/unthread right pants leg, Thread/unthread left pants leg, Pull pants up/down   Non-skid slipper  socks- Performed by patient: Don/doff right sock, Don/doff left sock Non-skid slipper socks- Performed by helper: Don/doff right sock, Don/doff left sock(helper assisted putting on clean socks) Socks - Performed by patient: Don/doff right sock, Don/doff left sock   Shoes - Performed by patient:  Don/doff right shoe, Don/doff left shoe, Fasten right, Fasten left            Lower body assist Assist for lower body dressing: Set up      Toileting Toileting   Toileting steps completed by patient: Adjust clothing prior to toileting, Performs perineal hygiene, Adjust clothing after toileting Toileting steps completed by helper: Adjust clothing prior to toileting, Performs perineal hygiene, Adjust clothing after toileting Toileting Assistive Devices: Grab bar or rail  Toileting assist Assist level: More than reasonable time   Transfers Chair/bed transfer   Chair/bed transfer method: Ambulatory Chair/bed transfer assist level: Supervision or verbal cues Chair/bed transfer assistive device: Patent attorneyWalker     Locomotion Ambulation     Max distance: 150 Assist level: Supervision or verbal cues   Wheelchair   Type: Manual Max wheelchair distance: 10 Assist Level: Touching or steadying assistance (Pt > 75%)  Cognition Comprehension Comprehension assist level: Follows complex conversation/direction with extra time/assistive device  Expression Expression assist level: Expresses complex ideas: With extra time/assistive device  Social Interaction Social Interaction assist level: Interacts appropriately with others with medication or extra time (anti-anxiety, antidepressant).  Problem Solving Problem solving assist level: Solves complex problems: With extra time  Memory Memory assist level: More than reasonable amount of time    Medical Problem List and Plan: 1.Left hemiparesis and functional deficitssecondary to right periventricular white matter infarct   Continue CIR-PT,OT therapies , team conf in am 2. DVT Prophylaxis/Anticoagulation: Pharmaceutical:Lovenox 3. Pain Management:tylenol prn with local Mesures for chronic pain.    -Utilize K pad for pain control  -  -Added Voltaren gel for her shoulders.  She may also apply this to her left foot 4. Mood:LCSW to follow for  evaluation and support. 5. Neuropsych: This patientis not fullycapable of making decisions on herown behalf. 6. Skin/Wound Care:Routine pressure relief measures. Maintain adequate nutritional and hydration status. 7. Fluids/Electrolytes/Nutrition:Monitor I/O.  8.LabileHTN:Poorly controlled per records, been seen by nephrology --negative renal work up.Monitorqid for now--continue lisinopril 40mg  per day, hydralazine 50mg  TID and tenormin 100mg .  Cont Catapres 1 TTS patch, consider increase dose in 2-3 d if not improving  Vitals:   08/12/17 0435 08/12/17 0753  BP: (!) 198/74 (!) 172/60  Pulse: 63 (!) 58  Resp: 18 18  Temp: 97.8 F (36.6 C) 97.7 F (36.5 C)  SpO2: 97% 99%   -elevated systolic will check orthostatics      9. GERD:Continue Protonix. 10. Hyponatremia:Chronic due to HCTZ/hygroton    .  Improved to 131 on 11/26    -Recheck labs tomorrow  11. Prediabetic: Hgb A1c- 5.9.  12. Hypoalbuminemia   Supplement initiated 11/17 13. ABLA   Hb 10.5 on 11/17   Cont to monitor 14.  Nasal congestion will monitor pt declines Claritin LOS (Days) 11 A FACE TO FACE EVALUATION WAS PERFORMED  Erick Colacendrew E Kirsteins 08/12/2017 8:35 AM

## 2017-08-12 NOTE — Progress Notes (Signed)
Physical Therapy Discharge Summary  Patient Details  Name: Solangel Mcmanaway MRN: [id number] Date of Birth: [birth year]  Today's Date: 08/12/2017 PT Individual Time: 1000-1100 PT Individual Time Calculation (min): 60 min    Patient has met 6 of 9 long term goals due to improved activity tolerance, improved balance, improved postural control, increased strength, improved attention, improved awareness and improved coordination.  Patient to discharge at an ambulatory level Modified Independent.   Patient's care partner is independent to provide the necessary physical assistance at discharge.  Reasons goals not met: Pt d/c at ambulatory status no w/c needed  Recommendation:  Patient will benefit from ongoing skilled PT services in home health setting to continue to advance safe functional mobility, address ongoing impairments in balance, safety, strength, endurance, and minimize fall risk.  Equipment: RW  Reasons for discharge: treatment goals met  Patient/family agrees with progress made and goals achieved: Yes  PT Discharge Precautions/Restrictions Precautions Precautions: Fall Restrictions Weight Bearing Restrictions: No Vital Signs  Pain Pain Assessment Pain Assessment: 0-10 Pain Score: 0-No pain Vision/Perception     Cognition Overall Cognitive Status: Impaired/Different from baseline Arousal/Alertness: Awake/alert Orientation Level: Oriented X4 Attention: Selective Selective Attention: Impaired Selective Attention Impairment: Verbal complex Memory: Impaired Memory Impairment: Decreased recall of new information Awareness: Appears intact Problem Solving: Appears intact Self Monitoring: Impaired Self Monitoring Impairment: Verbal complex Safety/Judgment: Appears intact Sensation Sensation Light Touch: Impaired Detail Light Touch Impaired Details: Impaired RUE;Impaired LUE(Due to hx of carpal tunnel) Coordination Gross Motor Movements are Fluid and Coordinated:  Yes Fine Motor Movements are Fluid and Coordinated: Yes Motor  Motor Motor: Within Functional Limits Motor - Discharge Observations: very mild hemiperesis  Mobility Bed Mobility Bed Mobility: Rolling Right;Supine to Sit;Sit to Supine Rolling Right: 6: Modified independent (Device/Increase time) Supine to Sit: 6: Modified independent (Device/Increase time) Sit to Supine: 6: Modified independent (Device/Increase time) Transfers Transfers: Yes Sit to Stand: 6: Modified independent (Device/Increase time) Stand to Sit: 6: Modified independent (Device/Increase time) Stand Pivot Transfers: 6: Modified independent (Device/Increase time) Locomotion  Ambulation Ambulation: Yes Ambulation/Gait Assistance: 6: Modified independent (Device/Increase time) Ambulation Distance (Feet): 300 Feet Assistive device: Rolling walker Gait Gait: Yes Gait Pattern: Within Functional Limits Gait Pattern: Decreased stride length Stairs / Additional Locomotion Stairs: Yes Stairs Assistance: 5: Supervision Stair Management Technique: One rail Right Number of Stairs: 12 Height of Stairs: 6 Curb: 6: Modified independent (Device/increase time)  Trunk/Postural Assessment  Cervical Assessment Cervical Assessment: Within Functional Limits Thoracic Assessment Thoracic Assessment: Exceptions to WFL(Kyphotic) Lumbar Assessment Lumbar Assessment: Exceptions to WFL(Posterior pelvic tilt) Postural Control Postural Control: Deficits on evaluation  Balance Balance Balance Assessed: Yes Berg Balance Test Sit to Stand: Able to stand  independently using hands Standing Unsupported: Able to stand safely 2 minutes Sitting with Back Unsupported but Feet Supported on Floor or Stool: Able to sit 2 minutes under supervision Stand to Sit: Controls descent by using hands Transfers: Able to transfer safely, definite need of hands Standing Unsupported with Eyes Closed: Able to stand 10 seconds safely Standing Ubsupported  with Feet Together: Able to place feet together independently and stand for 1 minute with supervision From Standing, Reach Forward with Outstretched Arm: Can reach forward >5 cm safely (2") From Standing Position, Pick up Object from Floor: Able to pick up shoe safely and easily From Standing Position, Turn to Look Behind Over each Shoulder: Looks behind one side only/other side shows less weight shift Turn 360 Degrees: Able to turn 360 degrees safely but slowly Standing Unsupported,  Alternately Place Feet on Step/Stool: Able to stand independently and complete 8 steps >20 seconds Standing Unsupported, One Foot in Front: Able to plae foot ahead of the other independently and hold 30 seconds Standing on One Leg: Tries to lift leg/unable to hold 3 seconds but remains standing independently Total Score: 41 Static Sitting Balance Static Sitting - Balance Support: Feet supported Static Sitting - Level of Assistance: 6: Modified independent (Device/Increase time) Static Standing Balance Static Standing - Balance Support: During functional activity Static Standing - Level of Assistance: 5: Stand by assistance;6: Modified independent (Device/Increase time) Dynamic Standing Balance Dynamic Standing - Balance Support: During functional activity;Right upper extremity supported;Left upper extremity supported Dynamic Standing - Level of Assistance: 6: Modified independent (Device/Increase time) Dynamic Standing - Balance Activities: Reaching for objects;Reaching across midline;Lateral lean/weight shifting Dynamic Standing - Comments: Standing to complete ADL tasks Extremity Assessment  RUE Assessment RUE Assessment: Exceptions to WFL(Generalized weakness 2/2 rotator cuff injury; bruised shoulder) LUE Assessment LUE Assessment: Exceptions to WFL(Generalized weakness, however, WFL to complete ADL/IADL tasks) RLE Assessment RLE Assessment: Within Functional Limits LLE Assessment LLE Assessment: Within  Functional Limits LLE AROM (degrees) Overall AROM Left Lower Extremity: Within functional limits for tasks assessed LLE Strength LLE Overall Strength: Within Functional Limits for tasks assessed LLE Overall Strength Comments: grossly 4/5   See Function Navigator for Current Functional Status.  Rosita DeChalus 08/12/2017, 12:25 PM

## 2017-08-12 NOTE — Progress Notes (Signed)
Physical Therapy Session Note  Patient Details  Name: Debbie Hampton MRN: [id number] Date of Birth: 03/30/1932  Today's Date: 08/12/2017 PT Individual Time: 1000-1100 1300-1335 PT Individual Time Calculation (min): 60 min and 35 min  Short Term Goals: Week 2:  PT Short Term Goal 1 (Week 2): =LTGs due to ELOS  Skilled Therapeutic Interventions/Progress Updates: Tx1: Pt presented in recliner agreeable to therapy. Pt ambulated to ortho gym and participated in functional activities including car transfer, stair training, bed mobility and transfers, all performed at supervision or Mod I level (see function). Pt participated in [place] balance assessment and had an improved score of 41/56, an improvement from original score of 30/56 however continues to demonstrate deficits in balance and continues to be a fall risk. Pt ambulated back to room and returned to recliner with call bell within reach and needs met.   Tx2:Pt presented in recliner agreeable to therapy. Pt ambulated to rehab gym and participated in simulation of bathroom and walking over shower threshold no AD. Provided pt min cues for safety regarding providing enough space when stepping to allow other foot to enter. Pt pleased with simulation. Pt returned to room and remained in recliner with call bell within reach and needs met.      Therapy Documentation Precautions:  Precautions Precautions: Fall Restrictions Weight Bearing Restrictions: No General:   Vital Signs:  Pain: Pain Assessment Pain Assessment: 0-10 Pain Score: 0-No pain    Other Treatments:     See Function Navigator for Current Functional Status.   Therapy/Group: Individual Therapy  Brandy Zuba  Azeneth Carbonell, PTA  08/12/2017, 12:22 PM

## 2017-08-12 NOTE — Progress Notes (Signed)
Occupational Therapy Discharge Summary  Patient Details  Name: Geana Walts MRN: [id number] Date of Birth: Apr 21, 1932   Patient has met 33 of 11 long term goals due to improved activity tolerance, improved balance, postural control, functional use of  LEFT upper extremity, improved awareness and improved coordination.  Patient to discharge at overall supervision- Modified Independent level.  Patient has plans for private paid caregiver to provide the necessary physical and cognitive assistance at discharge.    Recommendation:  Patient will benefit from ongoing skilled OT services in home health setting to continue to advance functional skills in the area of BADL and iADL.  Equipment:  RW, pt has built in shower seat and elevated toilets at home.   Reasons for discharge: treatment goals met and discharge from hospital  Patient/family agrees with progress made and goals achieved: Yes  OT Discharge Precautions/Restrictions  Precautions Precautions: Fall Restrictions Weight Bearing Restrictions: No Vision Baseline Vision/History: Wears glasses Wears Glasses: Reading only Patient Visual Report: No change from baseline Cognition Overall Cognitive Status: Impaired/Different from baseline Arousal/Alertness: Awake/alert Orientation Level: Oriented X4 Attention: Selective Selective Attention: Impaired Selective Attention Impairment: Verbal complex Memory: Impaired Memory Impairment: Decreased recall of new information Awareness: Appears intact Problem Solving: Appears intact Self Monitoring: Impaired Self Monitoring Impairment: Verbal complex Safety/Judgment: Appears intact Sensation Sensation Light Touch: Impaired Detail Light Touch Impaired Details: Impaired RUE;Impaired LUE(Due to hx of carpal tunnel) Coordination Gross Motor Movements are Fluid and Coordinated: Yes Fine Motor Movements are Fluid and Coordinated: Yes Motor  Motor Motor: Within Functional Limits Motor -  Discharge Observations: very mild hemiperesis Mobility  Bed Mobility Bed Mobility: Rolling Right;Supine to Sit;Sit to Supine Rolling Right: 6: Modified independent (Device/Increase time) Supine to Sit: 6: Modified independent (Device/Increase time) Sit to Supine: 6: Modified independent (Device/Increase time) Transfers Stand to Sit: 6: Modified independent (Device/Increase time)  Trunk/Postural Assessment  Cervical Assessment Cervical Assessment: Within Functional Limits Thoracic Assessment Thoracic Assessment: Exceptions to WFL(Kyphotic) Lumbar Assessment Lumbar Assessment: Exceptions to WFL(Posterior pelvic tilt) Postural Control Postural Control: Deficits on evaluation  Balance Balance Balance Assessed: Yes Berg Balance Test Sit to Stand: Able to stand  independently using hands Standing Unsupported: Able to stand safely 2 minutes Sitting with Back Unsupported but Feet Supported on Floor or Stool: Able to sit 2 minutes under supervision Stand to Sit: Controls descent by using hands Transfers: Able to transfer safely, definite need of hands Standing Unsupported with Eyes Closed: Able to stand 10 seconds safely Standing Ubsupported with Feet Together: Able to place feet together independently and stand for 1 minute with supervision From Standing, Reach Forward with Outstretched Arm: Can reach forward >5 cm safely (2") From Standing Position, Pick up Object from Floor: Able to pick up shoe safely and easily From Standing Position, Turn to Look Behind Over each Shoulder: Looks behind one side only/other side shows less weight shift Turn 360 Degrees: Able to turn 360 degrees safely but slowly Standing Unsupported, Alternately Place Feet on Step/Stool: Able to stand independently and complete 8 steps >20 seconds Standing Unsupported, One Foot in Front: Able to plae foot ahead of the other independently and hold 30 seconds Standing on One Leg: Tries to lift leg/unable to hold 3 seconds  but remains standing independently Total Score: 41 Static Sitting Balance Static Sitting - Balance Support: Feet supported Static Sitting - Level of Assistance: 6: Modified independent (Device/Increase time) Static Standing Balance Static Standing - Balance Support: During functional activity Static Standing - Level of Assistance: 5: Stand by  assistance;6: Modified independent (Device/Increase time) Dynamic Standing Balance Dynamic Standing - Balance Support: During functional activity;Right upper extremity supported;Left upper extremity supported Dynamic Standing - Level of Assistance: 6: Modified independent (Device/Increase time) Dynamic Standing - Comments: Standing to complete ADL tasks Extremity/Trunk Assessment RUE Assessment RUE Assessment: Exceptions to WFL(Generalized weakness 2/2 rotator cuff injury; bruised shoulder) LUE Assessment LUE Assessment: Exceptions to WFL(Generalized weakness, however, WFL to complete ADL/IADL tasks)   See Function Navigator for Current Functional Status.  Lewis, Macario Shear C 08/12/2017, 11:50 AM

## 2017-08-12 NOTE — Progress Notes (Signed)
Speech Language Pathology Session Note & Discharge Summary  Patient Details  Name: Debbie Hampton MRN: [id number] Date of Birth: 1931/10/01  Today's Date: 08/12/2017 SLP Individual Time: 1345-1430 SLP Individual Time Calculation (min): 45 min   Skilled Therapeutic Interventions:  Skilled treatment session focused on cognitive goals. SLP facilitated session by re-administering the MoCA. Patient scored 25/30 points with a score of 26 or above considered normal. Patient demonstrated deficits in short-term recall and attention. However, patient is overall Mod I for complex problem solving, attention, recall and awareness with functional and mildly complex tasks. Patient educated on overall general strategies to utilize at home to maximize safety and overall recall. She verbalized understanding and agreement. Patient left upright in recliner with all needs within reach.   Patient has met 3 of 3 long term goals.  Patient to discharge at overall Modified Independent level.   Reasons goals not met: N/A   Clinical Impression/Discharge Summary: Patient has made functional gains and has met 3 of 3 LTG's this admission. Currently, patient is overall Mod I for complex problem solving, recall and awareness with functional and familiar tasks. Patient education is complete and patient will discharge home with intermittent assistance from family. Skilled f/u SLP services is not warranted at this time due to patient being at cognitive baseline.   Recommendation:  None      Equipment: N/A   Reasons for discharge: Treatment goals met;Discharged from hospital   Patient/Family Agrees with Progress Made and Goals Achieved: Yes   Function:  Eating Eating   Modified Consistency Diet: No Eating Assist Level: No help, No cues           Cognition Comprehension Comprehension assist level: Follows complex conversation/direction with extra time/assistive device  Expression   Expression assist level:  Expresses complex ideas: With extra time/assistive device  Social Interaction Social Interaction assist level: Interacts appropriately with others with medication or extra time (anti-anxiety, antidepressant).  Problem Solving Problem solving assist level: Solves complex problems: With extra time  Memory Memory assist level: More than reasonable amount of time   Riki Gehring 08/12/2017, 2:43 PM

## 2017-08-12 NOTE — Discharge Summary (Signed)
Physician Discharge Summary  [name] MRN: [id number] DOB/AGE: 09-30-1931 83 y.o.  Admit date: 08/01/2017 Discharge date: 08/13/2017  Discharge Diagnoses:  Principal Problem:   Stroke (cerebrum) [facility](HCC) Active Problems:   Labile blood pressure   Benign essential HTN   Hyponatremia   Prediabetes   Hypoalbuminemia due to protein-calorie malnutrition (HCC)   Acute blood loss anemia   Discharged Condition: stable   Significant Diagnostic Studies:   Labs:  Basic Metabolic Panel: Recent Labs  Lab 08/08/17 0807 08/09/17 0527 08/10/17 0621 08/11/17 0630  NA 130* 129* 128* 131*  K 3.7 4.4 4.3 4.2  CL 95* 95* 94* 99*  CO2 27 26 26 27   GLUCOSE 123* 89 91 91  BUN 21* 32* 33* 26*  CREATININE 0.61 0.66 0.71 0.71  CALCIUM 9.4 9.2 9.1 9.2    CBC: Recent Labs  Lab 08/08/17 0807  WBC 4.3  HGB 11.6*  HCT 35.0*  MCV 83.5  PLT 271    CBG: No results for input(s): GLUCAP in the last 168 hours.  Today's Vitals   08/12/17 2000 08/13/17 0015 08/13/17 0600 08/13/17 1636  BP: (!) 129/56 (!) 134/52  (!) 160/52  Pulse: (!) 56 60 63   Resp: 16  18   Temp: 97.9 F (36.6 C) 97.7 F (36.5 C) 99.3 F (37.4 C)   TempSrc: Oral Oral Oral   SpO2: 100% 98%    Weight:      PainSc: 0-No pain       Brief HPI:   Debbie Hampton is an 83 years old female with history of HTN, GERD who was admitted on 07/29/17 with reports of multiple falls for 2-3 days PTA, left sided weakness and hypertensive emergency--SBP 260 pr reports.MRI brain done revealing 1.5 cm infarct in deep right periventricular white matter. 2D echo showed EF 60-65% with mild MVR, mild to moderate left atrial dilatation, and moderate pulmonary HTN. Carotid dopplers revealed moderate to large amount of atherosclerotic plaque with R >L 50-69% ICA stenosis. 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  Neurology recommended continuing ASA/Plavix for secondary stroke prevention and repeat follow up imaging to monitor aneurysm in 6- 12 months. Patient with limitations due to left sided weakness with balance deficits, LLE ataxia, tachycardia with activity and poor attention affecting ability to carry out ADLs and mobility. CIR was recommended for follow up therapy.    Hospital Course: Debbie Hampton was admitted to rehab 08/01/2017 for inpatient therapies to consist of PT, ST and OT at least three hours five days a week. Past admission physiatrist, therapy team and rehab RN have worked together to provide customized collaborative inpatient rehab. Her blood pressures have been labile and was noted to be extremely elevated at admission due to anxiety. Blood pressures have been monitored on qid basis and chlorthalidone added for better control. As BP started trending down this was discontinued. hyponatremia noted at admission with sodium at 128. Patient has been asymptomatic and Na has improved to 130. Pre renal azotemia is improving.  CBC showed transient drop in H/H and currently back on upward trend. Po intake has been good and she is continent of bowel and bladder. She has made good progress during her stay and is at modified independent at discharge. She will continue to receive follow jup HHPT,HHOT, HH Aide and HHRN by [facility] after discharge.     Rehab course: During patient's  stay in rehab weekly team conferences were held to monitor patient's progress, set goals and discuss barriers to discharge. At admission patient demonstrated deficits with short term recall and attention.  She required min assist with ADL tasks and mod assist with mobility. She has had improvement in activity tolerance, balance, postural control, as well as ability to compensate for deficits. She is has had improvement in functional use LUE  and LLE as well as improved awareness. Her balance score has improved from 30/56 to  41/56.  She is able to complete ADL tasks at modified independent to supervision level.  She is modified independent for transfers and is ambulating 300' with RW. She is able to climb 12 stairs with supervision. She is able to complete semi complex tasks  at modified independent level and recall and attention have improved.  Family education was completed regarding all aspect of care and they plan on hiring caregiver to assist after discharge.     Disposition: Home   Diet: Heart healthy.  Special Instructions: 1. Drink plenty of fluids ( at least 64 ounces daily) 2. Needs BMET/CBC rechecked in 7- 10 days.  3. Needs repeat imaging in 5 -6 months for monitoring of aneurysm.    Discharge Instructions    Ambulatory referral to Physical Medicine Rehab   Complete by:  As directed    1-2 weeks transitional care appt   Ambulatory referral to Physical Medicine Rehab   Complete by:  As directed    1-2 weeks transitional care appt     Current Discharge Medication List    START taking these medications   Details  diclofenac sodium (VOLTAREN) 1 % GEL Apply 2 g topically 3 (three) times daily. Qty: 3 Tube, Refills: 0    pantoprazole (PROTONIX) 40 MG tablet Take 1 tablet (40 mg total) by mouth daily. Qty: 30 tablet, Refills: 0    polyethylene glycol (MIRALAX / GLYCOLAX) packet Take 17 g by mouth daily. Qty: 30 each, Refills: 0    potassium chloride (K-DUR,KLOR-CON) 10 MEQ tablet Take 1 tablet (10 mEq total) by mouth 2 (two) times daily. Qty: 60 tablet, Refills: 0      CONTINUE these medications which have CHANGED   Details  atenolol (TENORMIN) 100 MG tablet Take 1 tablet (100 mg total) by mouth daily. Qty: 30 tablet, Refills: 0    clopidogrel (PLAVIX) 75 MG tablet Take 1 tablet (75 mg total) by mouth daily. Qty: 30 tablet, Refills: 0    hydrALAZINE (APRESOLINE) 50 MG tablet Take 1 tablet (50 mg total) by mouth 3 (three) times daily. Qty: 90 tablet, Refills: 0    lisinopril  (PRINIVIL,ZESTRIL) 40 MG tablet Take 1 tablet (40 mg total) by mouth daily. Qty: 30 tablet, Refills: 0      CONTINUE these medications which have NOT CHANGED   Details  aspirin EC 81 MG EC tablet Take 1 tablet (81 mg total) daily by mouth.    rosuvastatin (CRESTOR) 20 MG tablet Take 1 tablet (20 mg total) at bedtime by mouth. Qty: 30 tablet, Refills: 11      STOP taking these medications     esomeprazole (NEXIUM) 40 MG capsule      hydrochlorothiazide (HYDRODIURIL) 25 MG tablet        Follow-up Information    Kandyce Rud, MD Follow up on 08/25/2017.   Specialty:  Family Medicine Why:  Appointment @ 11:30 AM Contact information: [address] [facility] [place]   [phone]        Erick ColaceKirsteins, Andrew E, MD Follow up.   Specialty:  Physical Medicine and Rehabilitation Why:  office will call you with follow up appointment Contact information: [address] [place] [address] [phone]           Signed: Jacquelynn CreeLove, Malikah Principato S 08/13/2017, 5:49 PM

## 2017-08-12 NOTE — Progress Notes (Signed)
Social Work [name], female   DOB: 1932-09-08, 83 y.o.   MRN: [id number]  Met with pt and spoke with [facility] via telephone to discuss discharge tomorrow. He was wanting a 3 in 1 he feels this would help her and have added to [facility] order. Discussed home health contacting and coming out for therapies and can also discuss grab bars and adapations for the home. He feels she is doing well and ready to go home tomorrow.

## 2017-08-12 NOTE — Progress Notes (Signed)
Occupational Therapy Session Note  Patient Details  Name: Debbie Hampton MRN: [id number] Date of Birth: 1932/05/21  Today's Date: 08/12/2017 OT Individual Time: 0800-0900 OT Individual Time Calculation (min): 60 min    Short Term Goals: Week 1:  OT Short Term Goal 1 (Week 1): STG=LTG 2/2 ELOS  Skilled Therapeutic Interventions/Progress Updates:    PT seen for OT ADL bathing/dressing session. Pt sitting up in recliner upon arrival finishing breakfast and agreeable to tx session. She ambulated throughout room with RW at supervision-mod I level to gather clothing items, occasinal cues for safety to side step when reaching for item in order to reduce fall risk. She bathed seated on tub bench with distant supervision, buttock hygiene completed in standing with use of grab bars for support. She returned to recliner to dress with increased time, standing at RW to pull pants up.  She completed grooming tasks standing at sink mod I.  Pt returned to recliner at end of session, set-up with breakfast tray and RN present administering medications.   Therapy Documentation Precautions:  Precautions Precautions: Fall Restrictions Weight Bearing Restrictions: No Pain:   No/ denies pain  See Function Navigator for Current Functional Status.   Therapy/Group: Individual Therapy  Lewis, Emmajo Bennette C 08/12/2017, 6:37 AM

## 2017-08-12 NOTE — Progress Notes (Signed)
Social Work  Discharge Note  The overall goal for the admission was met for:   Discharge location: Yes-HOME WITH INTERMITTENT ASSIST FROM SON'S  Length of Stay: Yes-12 DAYS  Discharge activity level: Yes-MOD/I LEVEL  Home/community participation: Yes  Services provided included: MD, RD, PT, OT, SLP, RN, CM, TR, Pharmacy and SW  Financial Services: Private Insurance: [facility]  Follow-up services arranged: Home Health: [facility],.AIDE, DME: [place] [facility] 3 IN 1 and Patient/Family has no preference for HH/DME agencies  Comments (or additional information):PT DID WELL AND REACHED MOD/I LEVEL, HER HUSBAND WAS PLACED IN THE [facility] AT [place] WAS HIS CAREGIVER, PRIOR TO ADMISSION.   Patient/Family verbalized understanding of follow-up arrangements: Yes  Individual responsible for coordination of the follow-up plan: SELF & Wagoner-SON  Confirmed correct DME delivered: Elease Hashimoto 08/12/2017    Elease Hashimoto

## 2017-08-13 MED ORDER — POTASSIUM CHLORIDE CRYS ER 10 MEQ PO TBCR: 10.0000 meq | EXTENDED_RELEASE_TABLET | Freq: Two times a day (BID) | ORAL | 0 refills | Status: DC

## 2017-08-13 MED ORDER — ATENOLOL 100 MG PO TABS: 100.0000 mg | ORAL_TABLET | Freq: Every day | ORAL | 0 refills | Status: DC

## 2017-08-13 MED ORDER — LISINOPRIL 40 MG PO TABS: 40.0000 mg | ORAL_TABLET | Freq: Every day | ORAL | 0 refills | Status: DC

## 2017-08-13 MED ORDER — HYDRALAZINE HCL 50 MG PO TABS: 50.0000 mg | ORAL_TABLET | Freq: Three times a day (TID) | ORAL | 0 refills | Status: DC

## 2017-08-13 MED ORDER — PANTOPRAZOLE SODIUM 40 MG PO TBEC: 40.0000 mg | DELAYED_RELEASE_TABLET | Freq: Every day | ORAL | 0 refills | Status: DC

## 2017-08-13 MED ORDER — CLOPIDOGREL BISULFATE 75 MG PO TABS: 75.0000 mg | ORAL_TABLET | Freq: Every day | ORAL | 0 refills | Status: DC

## 2017-08-13 MED ORDER — POLYETHYLENE GLYCOL 3350 17 G PO PACK: 17.0000 g | PACK | Freq: Every day | ORAL | 0 refills | Status: DC

## 2017-08-13 MED ORDER — DICLOFENAC SODIUM 1 % TD GEL: 2.0000 g | Freq: Three times a day (TID) | TRANSDERMAL | 0 refills | Status: DC

## 2017-08-13 NOTE — Discharge Instructions (Signed)
Inpatient Rehab Discharge Instructions  Debbie BalesCarol Hampton Discharge date and time: 08/13/17   Activities/Precautions/ Functional Status: Activity: no lifting, driving, or strenuous exercise  till cleared by MD Diet: cardiac diet Wound Care: none needed   Functional status:  ___ No restrictions     ___ Walk up steps independently ___ 24/7 supervision/assistance   ___ Walk up steps with assistance _X__ Intermittent supervision/assistance  _X__ Bathe/dress independently ___ Walk with walker     ___ Bathe/dress with assistance ___ Walk Independently    ___ Shower independently ___ Walk with assistance    ___ Shower with assistance _X__ No alcohol     ___ Return to work/school ________  Special Instructions: 1. Need to increase fluid intake.   COMMUNITY REFERRALS UPON DISCHARGE:  Home Health:   PT, OT, RN AIDE  Agency:[facility]   Phone:[phone]   Date of last service:08/13/2017    Medical Equipment/Items Florestine AversOrdered:YOUTH ROLLING WALKER  Agency/Supplier:[facility]   [phone]   GENERAL COMMUNITY RESOURCES FOR PATIENT/FAMILY: Support Groups:[facility] SUPPORT GROUP EVERY FOURTH Tuesday @ [facility] FROM 12:00-1:30 PM QUESTIONS CONTACT [phone]    STROKE/TIA DISCHARGE INSTRUCTIONS SMOKING Cigarette smoking nearly doubles your risk of having a stroke & is the single most alterable risk factor  If you smoke or have smoked in the last 12 months, you are advised to quit smoking for your health.  Most of the excess cardiovascular risk related to smoking disappears within a year of stopping.  Ask you doctor about anti-smoking medications  [place] Quit Line: 1-800-QUIT NOW  Free Smoking Cessation Classes [phone]  CHOLESTEROL Know your levels; limit fat & cholesterol in your diet  Lipid Panel     Component Value Date/Time   CHOL 186 07/30/2017 0513   TRIG 103 07/30/2017 0513   HDL 70 07/30/2017 0513   CHOLHDL 2.7 07/30/2017 0513   VLDL 21  07/30/2017 0513   LDLCALC 95 07/30/2017 0513      Many patients benefit from treatment even if their cholesterol is at goal.  Goal: Total Cholesterol (CHOL) less than 160  Goal:  Triglycerides (TRIG) less than 150  Goal:  HDL greater than 40  Goal:  LDL (LDLCALC) less than 100   BLOOD PRESSURE American Stroke Association blood pressure target is less that 120/80 mm/Hg  Your discharge blood pressure is:  BP: (!) 134/52(rn notified )  Monitor your blood pressure  Limit your salt and alcohol intake  Many individuals will require more than one medication for high blood pressure  DIABETES (A1c is a blood sugar average for last 3 months) Goal HGBA1c is under 7% (HBGA1c is blood sugar average for last 3 months)  Diabetes: No known diagnosis of diabetes    Lab Results  Component Value Date   HGBA1C 5.9 (H) 07/30/2017     Your HGBA1c can be lowered with medications, healthy diet, and exercise.  Check your blood sugar as directed by your physician  Call your physician if you experience unexplained or low blood sugars.  PHYSICAL ACTIVITY/REHABILITATION Goal is 30 minutes at least 4 days per week  Activity: No driving, Therapies: see above Return to work: N/A  Activity decreases your risk of heart attack and stroke and makes your heart stronger.  It helps control your weight and blood pressure; helps you relax and can improve your mood.  Participate in a regular exercise program.  Talk with your doctor about the best form of exercise for you (dancing, walking, swimming, cycling).  DIET/WEIGHT Goal is to maintain a healthy weight  Your discharge diet is: Diet Heart Room service appropriate? Yes; Fluid consistency: Thin  liquids Your height is:    Your current weight is: Weight: 53.7 kg (118 lb 6.2 oz) Your Body Mass Index (BMI) is:  BMI (Calculated): 23.12  Following the type of diet specifically designed for you will help prevent another stroke.  You are at goal weight.    Your goal Body Mass Index (BMI) is 19-24.  Healthy food habits can help reduce 3 risk factors for stroke:  High cholesterol, hypertension, and excess weight.  RESOURCES Stroke/Support Group:  Call [phone]   STROKE EDUCATION PROVIDED/REVIEWED AND GIVEN TO PATIENT Stroke warning signs and symptoms How to activate emergency medical system (call 911). Medications prescribed at discharge. Need for follow-up after discharge. Personal risk factors for stroke. Pneumonia vaccine given:  Flu vaccine given:  My questions have been answered, the writing is legible, and I understand these instructions.  I will adhere to these goals & educational materials that have been provided to me after my discharge from the hospital.     My questions have been answered and I understand these instructions. I will adhere to these goals and the provided educational materials after my discharge from the hospital.  Patient/Caregiver Signature _______________________________ Date __________  Clinician Signature _______________________________________ Date __________  Please bring this form and your medication list with you to all your follow-up doctor's appointments.

## 2017-08-15 NOTE — Progress Notes (Signed)
Pt discharged to home accompanied by son. VSS, denies pain. Pt and son verbalize understanding of d/c instructions given by Marissa NestlePam Love, PAC.

## 2017-09-15 ENCOUNTER — Inpatient Hospital Stay: Payer: Medicare Other | Admitting: Physical Medicine & Rehabilitation

## 2017-09-21 ENCOUNTER — Emergency Department
Admission: EM | Admit: 2017-09-21 | Discharge: 2017-09-23 | Disposition: A | Discharge status: Skilled Nursing Facility | Payer: Medicare Other | Attending: Emergency Medicine | Admitting: Emergency Medicine

## 2017-09-21 ENCOUNTER — Emergency Department: Payer: Medicare Other

## 2017-09-21 ENCOUNTER — Encounter: Payer: Self-pay | Admitting: Emergency Medicine

## 2017-09-21 ENCOUNTER — Other Ambulatory Visit: Payer: Self-pay

## 2017-09-21 DIAGNOSIS — Z79899 Other long term (current) drug therapy: Secondary | ICD-10-CM | POA: Diagnosis not present

## 2017-09-21 DIAGNOSIS — Y9301 Activity, walking, marching and hiking: Secondary | ICD-10-CM | POA: Diagnosis not present

## 2017-09-21 DIAGNOSIS — Y92010 Kitchen of single-family (private) house as the place of occurrence of the external cause: Secondary | ICD-10-CM | POA: Diagnosis not present

## 2017-09-21 DIAGNOSIS — W01198A Fall on same level from slipping, tripping and stumbling with subsequent striking against other object, initial encounter: Secondary | ICD-10-CM | POA: Insufficient documentation

## 2017-09-21 DIAGNOSIS — R52 Pain, unspecified: Secondary | ICD-10-CM

## 2017-09-21 DIAGNOSIS — S0083XA Contusion of other part of head, initial encounter: Secondary | ICD-10-CM

## 2017-09-21 DIAGNOSIS — M6281 Muscle weakness (generalized): Secondary | ICD-10-CM | POA: Diagnosis not present

## 2017-09-21 DIAGNOSIS — S52502A Unspecified fracture of the lower end of left radius, initial encounter for closed fracture: Secondary | ICD-10-CM | POA: Diagnosis not present

## 2017-09-21 DIAGNOSIS — Z8673 Personal history of transient ischemic attack (TIA), and cerebral infarction without residual deficits: Secondary | ICD-10-CM | POA: Diagnosis not present

## 2017-09-21 DIAGNOSIS — R262 Difficulty in walking, not elsewhere classified: Secondary | ICD-10-CM | POA: Insufficient documentation

## 2017-09-21 DIAGNOSIS — Y999 Unspecified external cause status: Secondary | ICD-10-CM | POA: Insufficient documentation

## 2017-09-21 DIAGNOSIS — Z87891 Personal history of nicotine dependence: Secondary | ICD-10-CM | POA: Insufficient documentation

## 2017-09-21 DIAGNOSIS — Z7982 Long term (current) use of aspirin: Secondary | ICD-10-CM | POA: Diagnosis not present

## 2017-09-21 DIAGNOSIS — S0990XA Unspecified injury of head, initial encounter: Secondary | ICD-10-CM | POA: Diagnosis present

## 2017-09-21 DIAGNOSIS — Z7902 Long term (current) use of antithrombotics/antiplatelets: Secondary | ICD-10-CM | POA: Diagnosis not present

## 2017-09-21 DIAGNOSIS — I10 Essential (primary) hypertension: Secondary | ICD-10-CM | POA: Diagnosis not present

## 2017-09-21 LAB — BASIC METABOLIC PANEL
Anion gap: 10 (ref 5–15)
BUN: 24 mg/dL — ABNORMAL HIGH (ref 6–20)
CO2: 25 mmol/L (ref 22–32)
CREATININE: 0.83 mg/dL (ref 0.44–1.00)
Calcium: 9.5 mg/dL (ref 8.9–10.3)
Chloride: 97 mmol/L — ABNORMAL LOW (ref 101–111)
GFR calc Af Amer: >60 mL/min (ref >60)
GLUCOSE: 134 mg/dL — AB (ref 65–99)
POTASSIUM: 3.7 mmol/L (ref 3.5–5.1)
SODIUM: 132 mmol/L — AB (ref 135–145)

## 2017-09-21 LAB — CBC WITH DIFFERENTIAL/PLATELET
BASOS ABS: 0 10*3/uL (ref 0–0.1)
Basophils Relative: 0 %
EOS PCT: 0 %
Eosinophils Absolute: 0 10*3/uL (ref 0–0.7)
HCT: 35.2 % (ref 35.0–47.0)
Hemoglobin: 11.8 g/dL — ABNORMAL LOW (ref 12.0–16.0)
LYMPHS PCT: 8 %
Lymphs Abs: 0.8 10*3/uL — ABNORMAL LOW (ref 1.0–3.6)
MCH: 27.8 pg (ref 26.0–34.0)
MCHC: 33.6 g/dL (ref 32.0–36.0)
MCV: 82.7 fL (ref 80.0–100.0)
MONO ABS: 0.8 10*3/uL (ref 0.2–0.9)
Monocytes Relative: 8 %
Neutro Abs: 8.6 10*3/uL — ABNORMAL HIGH (ref 1.4–6.5)
Neutrophils Relative %: 84 %
PLATELETS: 271 10*3/uL (ref 150–440)
RBC: 4.25 MIL/uL (ref 3.80–5.20)
RDW: 14.4 % (ref 11.5–14.5)
WBC: 10.3 10*3/uL (ref 3.6–11.0)

## 2017-09-21 LAB — TROPONIN I

## 2017-09-21 MED ORDER — LIDOCAINE HCL (PF) 1 % IJ SOLN
INTRAMUSCULAR | Status: AC
  Filled 2017-09-21: qty 15

## 2017-09-21 MED ORDER — HYDRALAZINE HCL 20 MG/ML IJ SOLN
5.0000 mg | Freq: Once | INTRAMUSCULAR | Status: AC
  Administered 2017-09-21: 5 mg via INTRAVENOUS
  Filled 2017-09-21: qty 1

## 2017-09-21 MED ORDER — LIDOCAINE HCL (PF) 1 % IJ SOLN: 30.0000 mL | Freq: Once | INTRAMUSCULAR | Status: DC

## 2017-09-21 MED ORDER — ACETAMINOPHEN 500 MG PO TABS
1000.0000 mg | ORAL_TABLET | Freq: Once | ORAL | Status: AC
  Administered 2017-09-21: 1000 mg via ORAL
  Filled 2017-09-21: qty 2

## 2017-09-21 MED ORDER — LIDOCAINE HCL (PF) 1 % IJ SOLN
5.0000 mL | Freq: Once | INTRAMUSCULAR | Status: AC
  Administered 2017-09-22: 5 mL via INTRADERMAL

## 2017-09-21 MED ORDER — LIDOCAINE HCL (PF) 1 % IJ SOLN
INTRAMUSCULAR | Status: AC
  Filled 2017-09-21: qty 5

## 2017-09-21 NOTE — ED Provider Notes (Addendum)
[facility] Emergency Department Provider Note ____________________________________________   First MD Initiated Contact with Patient 09/21/17 2145     (approximate)  I have reviewed the triage vital signs and the nursing notes.   HISTORY  Chief Complaint Fall and Wrist Pain    HPI Debbie Hampton is a 84 y.o. female with past medical history as noted below who presents with head and wrist injuries, acute onset within the last hour when she had a mechanical fall from standing height, and not associated with LOC.  Patient does report a deformity to the left wrist.  She states that she was walking in the kitchen with her walker and lost her balance, causing her to fall.  She also reports pain to her left foot as well as to her neck, although the neck pain is somewhat chronic.  She also states she missed her evening blood pressure medication.  She states she was in her usual state of health for the last few weeks until today.  Past Medical History:  Diagnosis Date  . Bilateral shoulder pain   . Colon polyp   . Constipation   . GERD (gastroesophageal reflux disease)   . Hypertension   . Impaired fasting glucose   . Syncope 03/2016   with [facility]  . Varicose veins of both lower extremities     Patient Active Problem List   Diagnosis Date Noted  . Labile blood pressure   . Benign essential HTN   . Hypertensive crisis   . Hyponatremia   . Prediabetes   . Hypoalbuminemia due to protein-calorie malnutrition (HCC)   . Acute blood loss anemia   . 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 Acute CVA (cerebrovascular accident) (HCC) 07/29/2017  . Syncope, near 03/31/2016    Past Surgical History:  Procedure Laterality Date  . TONSILLECTOMY    . TUMOR REMOVAL     ovaries    Prior to Admission medications   Medication Sig Start Date End Date Taking? Authorizing Provider  aspirin EC 81 MG EC tablet Take 1 tablet (81 mg total) daily by mouth. 08/01/17  Yes Mody,  Sital, MD  atenolol (TENORMIN) 100 MG tablet Take 1 tablet (100 mg total) by mouth daily. 08/13/17  Yes Love, Evlyn KannerPamela S, PA-C  citalopram (CELEXA) 10 MG tablet Take 10 mg by mouth daily.   Yes [provider]  clopidogrel (PLAVIX) 75 MG tablet Take 1 tablet (75 mg total) by mouth daily. 08/13/17  Yes Love, Evlyn KannerPamela S, PA-C  diclofenac sodium (VOLTAREN) 1 % GEL Apply 2 g topically 3 (three) times daily. 08/13/17  Yes Love, Evlyn KannerPamela S, PA-C  hydrALAZINE (APRESOLINE) 50 MG tablet Take 1 tablet (50 mg total) by mouth 3 (three) times daily. 08/13/17  Yes Love, Evlyn KannerPamela S, PA-C  lisinopril (PRINIVIL,ZESTRIL) 40 MG tablet Take 1 tablet (40 mg total) by mouth daily. 08/13/17  Yes Love, Evlyn KannerPamela S, PA-C  pantoprazole (PROTONIX) 40 MG tablet Take 1 tablet (40 mg total) by mouth daily. 08/14/17  Yes Love, Evlyn KannerPamela S, PA-C  rosuvastatin (CRESTOR) 20 MG tablet Take 1 tablet (20 mg total) at bedtime by mouth. 08/01/17 08/01/18 Yes Mody, Sital, MD  polyethylene glycol (MIRALAX / GLYCOLAX) packet Take 17 g by mouth daily. 08/14/17   Love, Evlyn KannerPamela S, PA-C  potassium chloride (K-DUR,KLOR-CON) 10 MEQ tablet Take 1 tablet (10 mEq total) by mouth 2 (two) times daily. Patient not taking: Reported on 09/22/2017 08/13/17   Jacquelynn CreeLove, Pamela S, PA-C    Allergies Mushroom extract  complex; Penicillins; Aloe; Amlodipine; Cephalexin; Metoprolol; Miconazole; Neosporin [neomycin-bacitracin zn-polymyx]; and Trandolapril-verapamil hcl er  Family History  Problem Relation Age of Onset  . Hypertension Other   . Breast cancer Sister     Social History Social History   Tobacco Use  . Smoking status: Former Smoker    Types: Cigarettes  . Smokeless tobacco: Never Used  . Tobacco comment: quit 1985  Substance Use Topics  . Alcohol use: No  . Drug use: No    Review of Systems  Constitutional: No fever. Eyes: No visual changes. ENT: Positive for "tightness" in the neck Cardiovascular: Denies chest pain. Respiratory: Denies  shortness of breath. Gastrointestinal: No nausea, no vomiting.  Genitourinary: Negative for flank pain.  Musculoskeletal: Negative for back pain. Skin: Negative for rash. Neurological: Negative for severe headache.   ____________________________________________   PHYSICAL EXAM:  VITAL SIGNS: ED Triage Vitals  Enc Vitals Group     BP 09/21/17 2126 (!) 256/106     Pulse Rate 09/21/17 2126 61     Resp 09/21/17 2126 17     Temp 09/21/17 2126 97.6 F (36.4 C)     Temp Source 09/21/17 2126 Oral     SpO2 09/21/17 2126 99 %     Weight 09/21/17 2127 117 lb (53.1 kg)     Height 09/21/17 2127 5\' 1"  (1.549 m)     Head Circumference --      Peak Flow --      Pain Score 09/21/17 2131 3     Pain Loc --      Pain Edu? --      Excl. in GC? --     Constitutional: Alert and oriented.  Relatively well appearing and in no acute distress. Eyes: Conjunctivae are normal.  EOMI.  PERRLA. Head: Mild bruising to the left temporal and maxillary area. Nose: No congestion/rhinnorhea.  No nasal bony tenderness. Mouth/Throat: Mucous membranes are moist.   Neck: Normal range of motion.  No midline C-spine tenderness. Cardiovascular: Normal rate, regular rhythm. Grossly normal heart sounds.  Good peripheral circulation.  Chest wall nontender. Respiratory: Normal respiratory effort.  No retractions. Lungs CTAB. Gastrointestinal: Soft and nontender.  Genitourinary: No flank tenderness. Musculoskeletal: No midline spinal tenderness.  Left wrist with deformity, but 2+ radial pulse.  Left foot with no significant tenderness or deformity.  2+ DP pulse.  No lower extremity edema.  Extremities warm and well perfused.  Neurologic:  Normal speech and language.  Motor and sensory intact in all extremities.  Normal coordination.  No gross focal neurologic deficits are appreciated.  Motor and sensory intact in all distributions in the left upper extremity. Skin:  Skin is warm and dry. No rash noted. Psychiatric: Mood  and affect are normal. Speech and behavior are normal.  ____________________________________________   LABS (all labs ordered are listed, but only abnormal results are displayed)  Labs Reviewed  BASIC METABOLIC PANEL - Abnormal; Notable for the following components:      Result Value   Sodium 132 (*)    Chloride 97 (*)    Glucose, Bld 134 (*)    BUN 24 (*)    All other components within normal limits  CBC WITH DIFFERENTIAL/PLATELET - Abnormal; Notable for the following components:   Hemoglobin 11.8 (*)    Neutro Abs 8.6 (*)    Lymphs Abs 0.8 (*)    All other components within normal limits  TROPONIN I   ____________________________________________  EKG  ED ECG REPORT I, Dionne Bucy,  the attending physician, personally viewed and interpreted this ECG.  Date: 09/21/2017 EKG Time: 2149 Rate: 52 Rhythm: normal sinus rhythm QRS Axis: normal Intervals: normal ST/T Wave abnormalities: LVH Narrative Interpretation: no evidence of acute ischemia  ____________________________________________  RADIOLOGY  CT head: No ICH or other acute findings CT C-spine: No acute fracture CT maxillofacial: No acute fracture XR left foot and ankle: No acute fracture XR: L wrist: Comminuted and displaced distal radius fracture  ____________________________________________   PROCEDURES  Procedure(s) performed: Yes  Reduction of fracture Date/Time: 11:30PM Performed by: Dionne Bucy Authorized by: Dionne Bucy Consent: Verbal consent obtained. Risks and benefits: risks, benefits and alternatives were discussed Consent given by: patient Required items: required equipment available Time out: Immediately prior to procedure a "time out" was called to verify the correct patient, procedure, equipment, support staff and site/side marked as required.  Patient sedated: N/a Anesthesia: hematoma block, L wrist, 8cc lidocaine 1% w/o epi  Joint: L wrist  Indication: Distal  radius and ulna fracture with dorsal displacement Reduction technique: manual traction Exam: 2+ radial pulse, motor and sensory intact in median/radial/ulnar distributions before and after the procedure. Sugar tong splint applied.  Confirmation: post reduction X-ray showed improvement in alignment.  Patient tolerance: Patient tolerated the procedure well with no immediate complications.   Critical Care performed:No ____________________________________________   INITIAL IMPRESSION / ASSESSMENT AND PLAN / ED COURSE  Pertinent labs & imaging results that were available during my care of the patient were reviewed by me and considered in my medical decision making (see chart for details).  84 year old female with past medical history as noted above presents with head and left wrist injuries after a mechanical fall from standing height while losing her balance using her walker.  Patient did not have LOC and denies other injuries.  Past medical records reviewed in Epic and reveal the patient was most recently admitted in November of last year for a stroke, and per her son had been in rehab and then home for the last several weeks and doing well.  Patient's vital signs are normal except for significant hypertension which is likely due to missing her medication tonight as well as her pain.  She is comfortable appearing, and the remainder the exam is as described above.  Will obtain CT head, C-spine, and maxillofacial to rule out acute injuries.  I have a high suspicion for fracture of the left wrist.  Neuro/vascular intact in all extremities.  We will give hydralazine to treat the hypertension.  ----------------------------------------- 12:02 AM on 09/22/2017 -----------------------------------------  X-ray reveals left distal radius fracture and I consulted the on-call orthopedist Dr. Joice Lofts who recommended a reduction and splinting.  Given that the patient is dependent on walker for ambulation,  she will not be able to safely go home tonight with this fracture.  We will keep her in the emergency department for social work and PT eval in the morning.  Patient blood pressure remains significant elevated so we will give additional medication, however her lab workup is unremarkable and there is no evidence of end organ dysfunction.  I have performed the reduction and splinting, and signed the patient out to the oncoming physician Dr. Dolores Frame. ____________________________________________   FINAL CLINICAL IMPRESSION(S) / ED DIAGNOSES  Final diagnoses:  Closed fracture of distal end of left radius, unspecified fracture morphology, initial encounter  Contusion of face, initial encounter  Hypertension, unspecified type      NEW MEDICATIONS STARTED DURING THIS VISIT:  This SmartLink is deprecated. Use  AVSMEDLIST instead to display the medication list for a patient.   Note:  This document was prepared using Dragon voice recognition software and may include unintentional dictation errors.    Dionne Bucy, MD 09/22/17 Salley Hews    Dionne Bucy, MD 09/22/17 1126

## 2017-09-21 NOTE — ED Triage Notes (Addendum)
EMS pt from home following a fall; pt was ambulating in her kitchen with her walker when her feet slipped and she fell; hit left cheek on counter as she was falling, bruising already visible; pt c/o left wrist pain from fall, splint in place; pt denies dizziness and loss of consciousness; pt arrived awake and alert; talking in complete coherent sentences

## 2017-09-22 ENCOUNTER — Emergency Department: Payer: Medicare Other

## 2017-09-22 MED ORDER — ACETAMINOPHEN 325 MG PO TABS
ORAL_TABLET | ORAL | Status: AC
  Filled 2017-09-22: qty 1

## 2017-09-22 MED ORDER — ACETAMINOPHEN 325 MG PO TABS
325.0000 mg | ORAL_TABLET | Freq: Once | ORAL | Status: AC
  Administered 2017-09-22: 325 mg via ORAL

## 2017-09-22 MED ORDER — LISINOPRIL 10 MG PO TABS
40.0000 mg | ORAL_TABLET | Freq: Every day | ORAL | Status: DC
  Administered 2017-09-22 – 2017-09-23 (×2): 40 mg via ORAL
  Filled 2017-09-22 (×2): qty 4

## 2017-09-22 MED ORDER — CLOPIDOGREL BISULFATE 75 MG PO TABS
75.0000 mg | ORAL_TABLET | Freq: Every day | ORAL | Status: DC
  Administered 2017-09-22 – 2017-09-23 (×2): 75 mg via ORAL
  Filled 2017-09-22 (×2): qty 1

## 2017-09-22 MED ORDER — ACETAMINOPHEN 325 MG PO TABS
ORAL_TABLET | ORAL | Status: AC
  Administered 2017-09-22: 325 mg via ORAL
  Filled 2017-09-22: qty 1

## 2017-09-22 MED ORDER — PANTOPRAZOLE SODIUM 40 MG PO TBEC
40.0000 mg | DELAYED_RELEASE_TABLET | Freq: Every day | ORAL | Status: DC
  Administered 2017-09-22 – 2017-09-23 (×2): 40 mg via ORAL
  Filled 2017-09-22 (×2): qty 1

## 2017-09-22 MED ORDER — ASPIRIN EC 81 MG PO TBEC
81.0000 mg | DELAYED_RELEASE_TABLET | Freq: Every day | ORAL | Status: DC
  Administered 2017-09-22 – 2017-09-23 (×2): 81 mg via ORAL
  Filled 2017-09-22 (×2): qty 1

## 2017-09-22 MED ORDER — CLONIDINE HCL 0.1 MG PO TABS: 0.1000 mg | ORAL_TABLET | Freq: Once | ORAL | Status: DC

## 2017-09-22 MED ORDER — HYDRALAZINE HCL 50 MG PO TABS
50.0000 mg | ORAL_TABLET | Freq: Three times a day (TID) | ORAL | Status: DC
  Administered 2017-09-22 – 2017-09-23 (×4): 50 mg via ORAL
  Filled 2017-09-22 (×4): qty 1

## 2017-09-22 MED ORDER — ATENOLOL 25 MG PO TABS
100.0000 mg | ORAL_TABLET | Freq: Every day | ORAL | Status: DC
  Administered 2017-09-22 – 2017-09-23 (×2): 100 mg via ORAL
  Filled 2017-09-22 (×2): qty 4

## 2017-09-22 MED ORDER — ATENOLOL 25 MG PO TABS
ORAL_TABLET | ORAL | Status: AC
  Administered 2017-09-23: 100 mg via ORAL
  Filled 2017-09-22: qty 1

## 2017-09-22 MED ORDER — PANTOPRAZOLE SODIUM 40 MG PO TBEC
DELAYED_RELEASE_TABLET | ORAL | Status: AC
  Administered 2017-09-23: 40 mg via ORAL
  Filled 2017-09-22: qty 1

## 2017-09-22 MED ORDER — ROSUVASTATIN CALCIUM 20 MG PO TABS
20.0000 mg | ORAL_TABLET | Freq: Every day | ORAL | Status: DC
  Administered 2017-09-22: 20 mg via ORAL
  Filled 2017-09-22 (×3): qty 1

## 2017-09-22 NOTE — ED Notes (Signed)
Pt assisted on bedpan at this time. 

## 2017-09-22 NOTE — ED Notes (Signed)
Pt ambulated with walker and this RN to BR to brush teeth and use the restroom. Pt able to walk well with walker and complete brushing teeth by herself. Pt also assisted to BR which pt was able to use without difficulty. Pt ambulated back to bed where she was positioned comfortably and given 2 warm blankets. Pt notified her night time medications will be given momentarily and pt verbalized understanding of this.

## 2017-09-22 NOTE — ED Notes (Signed)
Given phone to call son

## 2017-09-22 NOTE — ED Notes (Signed)
Pt given phone to speak with daughter in law

## 2017-09-22 NOTE — ED Provider Notes (Signed)
-----------------------------------------   1:52 AM on 09/22/2017 -----------------------------------------  Care assumed from Dr. Marisa SeverinSiadecki.  Post reduction film reveals less dorsal angulation.  Plan is for patient to board in the ED pending clinical social work and physical therapy evaluations in the morning.  Patient is currently sleeping.  When she awakes, we will transfer her to a more comfortable inpatient bed.  Home medications ordered.   ----------------------------------------- 6:41 AM on 09/22/2017 -----------------------------------------  No events overnight.  Patient remains in the ED pending clinical social work and physical therapy consults this morning.  Care will be transferred to the oncoming provider.   Irean HongSung, Prescott Truex J, MD 09/22/17 [phone]

## 2017-09-22 NOTE — ED Notes (Signed)
Returned call to son Gerri Sporewesley.  He reports pt will be going to [facility].

## 2017-09-22 NOTE — ED Notes (Signed)
Pt c/o leg cramps. Dr Nelva Bushnorma notified

## 2017-09-22 NOTE — ED Notes (Signed)
Waiting on social work consult. No needs at this time.

## 2017-09-22 NOTE — ED Notes (Signed)
Pt given this nurse's phone to speak with son Gerri SporeWesley

## 2017-09-22 NOTE — ED Notes (Signed)
Pt's son in rm at this time [name]([place]), son had questions about his mother's care, questions answered to the best of this RN's ability. Pt offered needs for comfort, pt's BP rechecked per request. Pt given warm blankets for legs. Pt resting in bed at this time.

## 2017-09-22 NOTE — ED Notes (Signed)
Social work at bedside.  

## 2017-09-22 NOTE — ED Notes (Signed)
Pt given coke, graham crackers, and PB.

## 2017-09-22 NOTE — ED Notes (Signed)
Phone brought in to patient

## 2017-09-22 NOTE — ED Notes (Signed)
Pt's son came and stated he needed to know when SW consult would take place because he did not have his meds with him. This nurse called SW and was told they are coming at 1230. Son informed.

## 2017-09-22 NOTE — ED Notes (Signed)
Pt given lip moisturizer and head of bed lowered. Pt states she will be falling asleep soon, call bell continued to be at bedside, pt notified to let this RN know if she needs anything to use the call bell, pt verbalized understanding of this.

## 2017-09-22 NOTE — ED Notes (Signed)
Son Annie MainWayne Schadt is going home at this time. His number is [phone].

## 2017-09-22 NOTE — Evaluation (Signed)
Physical Therapy Evaluation Patient Details Name: Debbie Hampton MRN: [id number] DOB: 10/10/1931 Today's Date: 09/22/2017   History of Present Illness  Pt is an 84 y.o.femalewith past medical history that includes CVA, B shoulder pain, GERD, HTN, and syncope who presents with head and wrist injuries when she had a mechanical fall from standing height, and not associated with LOC. Patient does report a deformity to the left wrist. She states that she was walking in the kitchen with her walker and lost her balance, causing her to fall. She also reports pain to her left foot as well as to her neck, although the neck pain is somewhat chronic. She also states she missed her evening blood pressure medication. She states she was in her usual state of health for the last few weeks until today.  Assessment includes: comminuted and displaced distal L radius fracture s/p reduction and splinting.      Clinical Impression  Pt presents with deficits in strength, transfers, mobility, gait, balance, and activity tolerance.  Pt required mod A with bed mobility tasks and min A with transfers each with extensive cues for sequencing and to prevent use of LUE.  Pt effortful with amb with assistance required to navigate LUE PW and for general cues for sequencing and use of AD.  Pt's SpO2 and HR WNL during session without c/o adverse symptoms other than general fatigue.  Pt will benefit from PT services in a SNF setting upon discharge to safely address above deficits for decreased caregiver assistance and eventual return to PLOF.      SNF; 24/hr supervision/ALF after SNF    Equipment Recommendations  Other (comment)(L platform for pt's RW)    Recommendations for Other Services OT consult     Precautions / Restrictions Precautions Precautions: Fall Required Braces or Orthoses: Other Brace/Splint;Sling Other Brace/Splint: L wrist splint and sling Restrictions Weight Bearing Restrictions: Yes LUE Weight  Bearing: Non weight bearing(No WB orders noted in chart, treated as NWB during PT eval) Other Position/Activity Restrictions: Per MD in ED ok to remove sling during amb with use of LUE PW      Mobility  Bed Mobility Overal bed mobility: Needs Assistance Bed Mobility: Sit to Supine;Supine to Sit     Supine to sit: Mod assist Sit to supine: Mod assist   General bed mobility comments: Assist for BLEs and trunk in and out of bed with verbal cues for sequencing  Transfers Overall transfer level: Needs assistance Equipment used: Rolling walker (2 wheeled) Transfers: Sit to/from Stand Sit to Stand: Min assist         General transfer comment: Mod verbal cues for sequencing  Ambulation/Gait Ambulation/Gait assistance: Min assist Ambulation Distance (Feet): 30 Feet Assistive device: Left platform walker Gait Pattern/deviations: Step-through pattern;Decreased step length - right;Decreased step length - left   Gait velocity interpretation: <1.8 ft/sec, indicative of risk for recurrent falls General Gait Details: Min A to guide PW and verbal cues for general sequencing and safe use of PW; very slow and effortful cadence and short B step length  Stairs Stairs: (deferred)          Wheelchair Mobility    Modified Rankin (Stroke Patients Only)       Balance Overall balance assessment: Needs assistance Sitting-balance support: Feet supported;Single extremity supported Sitting balance-Leahy Scale: Good     Standing balance support: Bilateral upper extremity supported Standing balance-Leahy Scale: Fair  Pertinent Vitals/Pain Pain Assessment: 0-10 Pain Score: 2  Pain Location: L wrist Pain Descriptors / Indicators: Aching Pain Intervention(s): Premedicated before session;Limited activity within patient's tolerance    Home Living Family/patient expects to be discharged to:: Private residence Living Arrangements:  Alone Available Help at Discharge: Family;Personal care attendant;Available PRN/intermittently(Pt lives in [place], two sons live in [place]) Type of Home: House Home Access: Stairs to enter Entrance Stairs-Rails: Right;Left(Too wide for both) Secretary/administratorntrance Stairs-Number of Steps: 4 Home Layout: One level Home Equipment: Environmental consultantWalker - 2 wheels;Bedside commode;Toilet riser      Prior Function Level of Independence: Needs assistance   Gait / Transfers Assistance Needed: Mod Ind with amb in household with RW, Ind with transfers and bed mobility; Pt required assistance from sons with stairs and did not leave the house without them.  ADL's / Homemaking Assistance Needed: PCA 3x/wk for bathing and general assistance around the house        Hand Dominance   Dominant Hand: Left    Extremity/Trunk Assessment   Upper Extremity Assessment Upper Extremity Assessment: Generalized weakness;LUE deficits/detail LUE: Unable to fully assess due to immobilization    Lower Extremity Assessment Lower Extremity Assessment: Generalized weakness       Communication   Communication: No difficulties  Cognition Arousal/Alertness: Awake/alert Behavior During Therapy: WFL for tasks assessed/performed Overall Cognitive Status: Within Functional Limits for tasks assessed                                        General Comments      Exercises     Assessment/Plan    PT Assessment Patient needs continued PT services  PT Problem List Decreased strength;Decreased activity tolerance;Decreased balance;Decreased knowledge of use of DME;Decreased mobility       PT Treatment Interventions DME instruction;Gait training;Stair training;Functional mobility training;Neuromuscular re-education;Balance training;Therapeutic exercise;Therapeutic activities;Patient/family education    PT Goals (Current goals can be found in the Care Plan section)  Acute Rehab PT Goals Patient Stated Goal: To walk  better PT Goal Formulation: With patient Time For Goal Achievement: 10/05/17 Potential to Achieve Goals: Good    Frequency 7X/week   Barriers to discharge Inaccessible home environment;Decreased caregiver support      Co-evaluation               AM-PAC PT "6 Clicks" Daily Activity  Outcome Measure Difficulty turning over in bed (including adjusting bedclothes, sheets and blankets)?: Unable Difficulty moving from lying on back to sitting on the side of the bed? : Unable Difficulty sitting down on and standing up from a chair with arms (e.g., wheelchair, bedside commode, etc,.)?: Unable Help needed moving to and from a bed to chair (including a wheelchair)?: A Little Help needed walking in hospital room?: A Little Help needed climbing 3-5 steps with a railing? : A Lot 6 Click Score: 11    End of Session Equipment Utilized During Treatment: Gait belt Activity Tolerance: Patient tolerated treatment well Patient left: in bed;with family/visitor present;with call bell/phone within reach Nurse Communication: Mobility status PT Visit Diagnosis: Muscle weakness (generalized) (M62.81);Difficulty in walking, not elsewhere classified (R26.2)    Time: [id number] PT Time Calculation (min) (ACUTE ONLY): 56 min   Charges:   PT Evaluation $PT Eval Low Complexity: 1 Low PT Treatments $Gait Training: 8-22 mins   PT G Codes:        Elly Modena. Scott Izea Livolsi PT, DPT 09/22/17, 11:07  AM

## 2017-09-22 NOTE — ED Notes (Signed)
Resumed care from [place]. Pt sleeping soundly; nad noted. Will continue to monitor.

## 2017-09-22 NOTE — Evaluation (Signed)
Occupational Therapy Evaluation Patient Details Name: Debbie Hampton MRN: [id number] DOB: [birth year] Today's Date: 09/22/2017    History of Present Illness Pt is an 84 y.o.femalewith past medical history that includes CVA, B shoulder pain, GERD, HTN, and syncope who presents with head and wrist injuries when she had a mechanical fall from standing height, and not associated with LOC. Patient does report a deformity to the left wrist. She states that she was walking in the kitchen with her walker and lost her balance, causing her to fall. She also reports pain to her left foot as well as to her neck, although the neck pain is somewhat chronic. She also states she missed her evening blood pressure medication. She states she was in her usual state of health for the last few weeks until today.  Assessment includes: comminuted and displaced distal L radius fracture s/p reduction and splinting.     Clinical Impression   Pt seen for OT evaluation this date. Pt was receiving HH therapy services and had an aide coming 3x/wk to assist with bathing/dressing prior to admission following a previous CVA. Pt notes she lost her balance which caused her to fall but denies any other previous falls. Currently, pt requires min assist for functional mobility using L platform RW, and requires mod-max assist for LB ADL tasks due to impaired functional LUE use, pain, decreased strength/ROM in LUE, and generalized weakness. Pt will benefit from skilled OT services to address noted impairments and functional deficits in order to maximize return to PLOF and minimize risk of future falls, injury, readmission, and higher level of care with increased caregiver burden. Recommend STR following hospitalization.    Follow Up Recommendations  SNF    Equipment Recommendations       Recommendations for Other Services       Precautions / Restrictions Precautions Precautions: Fall Required Braces or Orthoses: Other  Brace/Splint;Sling Other Brace/Splint: L wrist splint and sling Restrictions Weight Bearing Restrictions: Yes LUE Weight Bearing: Non weight bearing      Mobility Bed Mobility Overal bed mobility: Needs Assistance Bed Mobility: Sit to Supine;Supine to Sit     Supine to sit: Mod assist Sit to supine: Mod assist      Transfers Overall transfer level: Needs assistance Equipment used: Rolling walker (2 wheeled) Transfers: Sit to/from Stand Sit to Stand: Min assist         General transfer comment: Mod verbal cues for sequencing    Balance Overall balance assessment: Needs assistance Sitting-balance support: Feet supported;Single extremity supported Sitting balance-Leahy Scale: Good     Standing balance support: Bilateral upper extremity supported Standing balance-Leahy Scale: Fair                             ADL either performed or assessed with clinical judgement   ADL Overall ADL's : Needs assistance/impaired Eating/Feeding: Sitting;Set up Eating/Feeding Details (indicate cue type and reason): using L non-dom hand Grooming: Sitting;Set up Grooming Details (indicate cue type and reason): using L non-dom hand Upper Body Bathing: Minimal assistance;Moderate assistance;Sitting   Lower Body Bathing: Moderate assistance;Sit to/from stand   Upper Body Dressing : Moderate assistance;Minimal assistance;Sitting   Lower Body Dressing: Sit to/from stand;Moderate assistance;Maximal assistance   Toilet Transfer: RW;Stand-pivot;BSC;Minimal assistance           Functional mobility during ADLs: Minimal assistance;Rolling walker;Cueing for safety       Vision Baseline Vision/History: Wears glasses Wears Glasses: At all times(notes  that glasses were damaged when she fell) Patient Visual Report: No change from baseline Vision Assessment?: No apparent visual deficits     Perception     Praxis      Pertinent Vitals/Pain Pain Assessment: Faces Faces Pain  Scale: Hurts a little bit Pain Location: L wrist Pain Descriptors / Indicators: Aching Pain Intervention(s): Limited activity within patient's tolerance;Monitored during session     Hand Dominance Left   Extremity/Trunk Assessment Upper Extremity Assessment Upper Extremity Assessment: Generalized weakness;LUE deficits/detail LUE: Unable to fully assess due to immobilization   Lower Extremity Assessment Lower Extremity Assessment: Generalized weakness   Cervical / Trunk Assessment Cervical / Trunk Assessment: Normal   Communication Communication Communication: No difficulties   Cognition Arousal/Alertness: Awake/alert Behavior During Therapy: WFL for tasks assessed/performed Overall Cognitive Status: Within Functional Limits for tasks assessed                                 General Comments: hyperverbal with extraneous information, required verbal cues to attend and redirect   General Comments       Exercises     Shoulder Instructions      Home Living Family/patient expects to be discharged to:: Private residence Living Arrangements: Alone Available Help at Discharge: Family;Personal care attendant;Available PRN/intermittently Type of Home: House Home Access: Stairs to enter Entergy CorporationEntrance Stairs-Number of Steps: 4 Entrance Stairs-Rails: Right;Left Home Layout: One level     Bathroom Shower/Tub: Walk-in shower;Door   Bathroom Toilet: Handicapped height Bathroom Accessibility: Yes   Home Equipment: Environmental consultantWalker - 2 wheels;Bedside commode;Toilet riser          Prior Functioning/Environment Level of Independence: Needs assistance  Gait / Transfers Assistance Needed: Mod Ind with amb in household with RW, Ind with transfers and bed mobility; Pt required assistance from sons with stairs and did not leave the house without them. ADL's / Homemaking Assistance Needed: PCA 3x/wk for bathing and general assistance around the house            OT Problem List:  Decreased strength;Decreased range of motion;Decreased knowledge of use of DME or AE;Decreased activity tolerance;Impaired UE functional use;Decreased safety awareness;Impaired balance (sitting and/or standing);Pain      OT Treatment/Interventions: Self-care/ADL training;Therapeutic exercise;Therapeutic activities;DME and/or AE instruction;Patient/family education    OT Goals(Current goals can be found in the care plan section) Acute Rehab OT Goals Patient Stated Goal: To walk better OT Goal Formulation: With patient Time For Goal Achievement: 10/06/17 Potential to Achieve Goals: Good  OT Frequency: Min 2X/week   Barriers to D/C: Inaccessible home environment;Decreased caregiver support          Co-evaluation              AM-PAC PT "6 Clicks" Daily Activity     Outcome Measure Help from another person eating meals?: A Little Help from another person taking care of personal grooming?: A Little Help from another person toileting, which includes using toliet, bedpan, or urinal?: A Lot Help from another person bathing (including washing, rinsing, drying)?: A Lot Help from another person to put on and taking off regular upper body clothing?: A Lot Help from another person to put on and taking off regular lower body clothing?: A Lot 6 Click Score: 14   End of Session    Activity Tolerance: Patient tolerated treatment well Patient left: in bed;with call bell/phone within reach;Other (comment)(L hand splint/sling in place)  OT Visit Diagnosis: Other abnormalities  of gait and mobility (R26.89);Muscle weakness (generalized) (M62.81);History of falling (Z91.81)                Time: 6213-0865 OT Time Calculation (min): 28 min Charges:  OT General Charges $OT Visit: 1 Visit OT Evaluation $OT Eval Moderate Complexity: 1 Mod G-Codes:     Richrd Prime, MPH, MS, OTR/L ascom [phone] 09/22/17, 4:08 PM

## 2017-09-22 NOTE — ED Notes (Signed)
Pt placed on bedpan; blue scrub pants put on her for PT to assess her. Pt requests food; given a sandwich tray. NAD noted.

## 2017-09-22 NOTE — Clinical Social Work Note (Addendum)
CSW met with pt and son-Wayne Tobin Chad at bedside to address Social Work consult. CSW introduced self and explained Social Work role. Pt lives at [facility]. P/T recommends SNF placement and pt agreeable. Pt's son-Wayne called pt's son/Healthcare POA [employer] and put on Speakerphone. CSW explained SNF placement process and ALF vs. SNF. CSW provided SNF listing for [facility] and [employer] [facility]. Family SNF preference is [facility]. CSW completed FL-2 and faxed out. CSW left a voicemail with Judeen Hammans at [facility] [phone]) for Merri Ray in admissions to call CSW back.  3:00pm- Returned call to pt's son Saadia Dewitt at [phone]. Son stated he contacted [facility] and they spoke with [facility]. Son stated [facility] can accept pt. CSW explained informed son that CSW left voicemail for Merri Ray and would need feedback from her before moving forward. Son expressed understanding.   4:01pm- Received call back from [place] at [facility], who stated she can make an offer on pt, but needs to get insurance authorization first. Ms. Iona Beard stated she doesn't think she'll get authorization today and we should plan for admission tomorrow. Updated son-Knobel Tobin Chad. CSW continuing to follow for discharge needs.   Oretha Ellis, Latanya Presser, [place] Social Worker-ED [phone]

## 2017-09-22 NOTE — ED Notes (Signed)
Assisted to bedpan. No other needs at this time. Changed position

## 2017-09-22 NOTE — ED Notes (Signed)
Lunch tray given. 

## 2017-09-22 NOTE — NC FL2 (Signed)
[place] MEDICAID FL2 LEVEL OF CARE SCREENING TOOL     IDENTIFICATION  Patient Name: Debbie Hampton Birthdate: 05/01/1932 Sex: female Admission Date (Current Location): 09/21/2017  [place] Number:  Chiropodist and Address:  [facility], [address], [place], [place] [address]      Provider Number: [id number]  Attending Physician Name and Address:  No att. providers found  Relative Name and Phone Number:  Tamika Shropshire [phone]    Current Level of Care: Hospital Recommended Level of Care: Skilled Nursing Facility Prior Approval Number:    Date Approved/Denied:   PASRR Number: [id number]  Discharge Plan: SNF    Current Diagnoses: Patient Active Problem List   Diagnosis Date Noted  . Labile blood pressure   . Benign essential HTN   . Hypertensive crisis   . Hyponatremia   . Prediabetes   . Hypoalbuminemia due to protein-calorie malnutrition (HCC)   . Acute blood loss anemia   . 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 Acute CVA (cerebrovascular accident) (HCC) 07/29/2017  . Syncope, near 03/31/2016    Orientation RESPIRATION BLADDER Height & Weight     Self, Time, Situation, Place  Normal Continent Weight: 117 lb (53.1 kg) Height:  5\' 1"  (154.9 cm)  BEHAVIORAL SYMPTOMS/MOOD NEUROLOGICAL BOWEL NUTRITION STATUS      Continent Diet(Regular Diet)  AMBULATORY STATUS COMMUNICATION OF NEEDS Skin   Extensive Assist Verbally Normal                       Personal Care Assistance Level of Assistance  Bathing, Feeding, Dressing Bathing Assistance: Limited assistance Feeding assistance: Limited assistance Dressing Assistance: Limited assistance     Functional Limitations Info  Sight, Hearing, Speech Sight Info: Adequate Hearing Info: Adequate Speech Info: Adequate    SPECIAL CARE FACTORS FREQUENCY  PT (By licensed PT), OT (By licensed OT)     PT Frequency: 5x OT Frequency: 5x             Contractures Contractures Info: Not present    Additional Factors Info  Code Status, Allergies Code Status Info: Full Allergies Info: Mushroom Extract Complex, Penicillins, Aloe, Amlodipine, Cephalexin, Metoprolol, Miconazole, Neosporin Neomycin-bacitracin Zn-polymyx, Trandolapril-verapamil Hcl Er           Current Medications (09/22/2017):  This is the current hospital active medication list Current Facility-Administered Medications  Medication Dose Route Frequency Provider Last Rate Last Dose  . aspirin EC tablet 81 mg  81 mg Oral Daily Irean Hong, MD   81 mg at 09/22/17 1108  . atenolol (TENORMIN) tablet 100 mg  100 mg Oral Daily Irean Hong, MD   100 mg at 09/22/17 1111  . clopidogrel (PLAVIX) tablet 75 mg  75 mg Oral Daily Irean Hong, MD   75 mg at 09/22/17 1108  . hydrALAZINE (APRESOLINE) tablet 50 mg  50 mg Oral TID Irean Hong, MD   50 mg at 09/22/17 1111  . lisinopril (PRINIVIL,ZESTRIL) tablet 40 mg  40 mg Oral Daily Irean Hong, MD   40 mg at 09/22/17 1107  . pantoprazole (PROTONIX) EC tablet 40 mg  40 mg Oral Daily Irean Hong, MD   40 mg at 09/22/17 1108  . rosuvastatin (CRESTOR) tablet 20 mg  20 mg Oral QHS Irean Hong, MD       Current Outpatient Medications  Medication Sig Dispense Refill  . aspirin EC 81 MG EC tablet Take 1 tablet (81  mg total) daily by mouth.    Marland Kitchen. atenolol (TENORMIN) 100 MG tablet Take 1 tablet (100 mg total) by mouth daily. 30 tablet 0  . citalopram (CELEXA) 10 MG tablet Take 10 mg by mouth daily.    . clopidogrel (PLAVIX) 75 MG tablet Take 1 tablet (75 mg total) by mouth daily. 30 tablet 0  . diclofenac sodium (VOLTAREN) 1 % GEL Apply 2 g topically 3 (three) times daily. 3 Tube 0  . hydrALAZINE (APRESOLINE) 50 MG tablet Take 1 tablet (50 mg total) by mouth 3 (three) times daily. 90 tablet 0  . lisinopril (PRINIVIL,ZESTRIL) 40 MG tablet Take 1 tablet (40 mg total) by mouth daily. 30 tablet 0  . pantoprazole (PROTONIX) 40 MG tablet Take 1  tablet (40 mg total) by mouth daily. 30 tablet 0  . rosuvastatin (CRESTOR) 20 MG tablet Take 1 tablet (20 mg total) at bedtime by mouth. 30 tablet 11  . polyethylene glycol (MIRALAX / GLYCOLAX) packet Take 17 g by mouth daily. 30 each 0  . potassium chloride (K-DUR,KLOR-CON) 10 MEQ tablet Take 1 tablet (10 mEq total) by mouth 2 (two) times daily. (Patient not taking: Reported on 09/22/2017) 60 tablet 0     Discharge Medications: Please see discharge summary for a list of discharge medications.  Relevant Imaging Results:  Relevant Lab Results:   Additional Information SSN: [id number]  Dominic PeaJeneya G Jordon Kristiansen, LCSW

## 2017-09-22 NOTE — ED Notes (Signed)
Pt sleeping with son in and out of room

## 2017-09-23 MED ORDER — POLYETHYLENE GLYCOL 3350 17 G PO PACK
17.0000 g | PACK | Freq: Every day | ORAL | Status: DC
  Administered 2017-09-23: 17 g via ORAL
  Filled 2017-09-23: qty 1

## 2017-09-23 MED ORDER — POLYETHYLENE GLYCOL 3350 17 G PO PACK
17.0000 g | PACK | Freq: Every day | ORAL | Status: DC
  Administered 2017-09-23: 17 g via ORAL

## 2017-09-23 MED ORDER — POLYETHYLENE GLYCOL 3350 17 G PO PACK
PACK | ORAL | Status: AC
  Administered 2017-09-23: 17 g via ORAL
  Filled 2017-09-23: qty 1

## 2017-09-23 MED ORDER — SENNOSIDES-DOCUSATE SODIUM 8.6-50 MG PO TABS
2.0000 | ORAL_TABLET | Freq: Once | ORAL | Status: AC
  Administered 2017-09-23: 2 via ORAL
  Filled 2017-09-23: qty 2

## 2017-09-23 MED ORDER — CLONIDINE HCL 0.1 MG PO TABS
0.1000 mg | ORAL_TABLET | Freq: Once | ORAL | Status: AC
  Administered 2017-09-23: 0.1 mg via ORAL
  Filled 2017-09-23: qty 1

## 2017-09-23 NOTE — Clinical Social Work Note (Signed)
[facility]. CSW confirmed bed offer with Myna HidalgoStephanie George at [facility]. RN updated and to call report to [phone] for room 207. ED secretary to arrange [facility]. Son-[place] Cedric FishmanSharpe [phone] notified and initially [facility], but [facility] Secretary verified [facility] would not [facility] pt to [facility]. CSW informed son that pt to [facility]. Son stated [facility] will [facility] and [employer] to verify coverage for transport. CSW awaiting return call back.   Per Clinical Social Work ChiropodistAssistant Director Wandra MannanZack Brooks, son is in agreement with [facility]. CSW updated RN and ED Secretary. CSW signing off as no further Social Work needs identified.   Corlis HoveJeneya Cottrell Gentles, Theresia MajorsLCSWA, [facility] Clinical Social Worker-ED [phone]

## 2017-09-23 NOTE — Progress Notes (Signed)
SNF and Non-Emergent EMS/Van Transport Benefits:  Number called: [phone] Rep: Bree Reference Number: [id number]  [employer] Medicare Complete Choice PPO plan active as of 09/16/17 with no deductible.  Out of pocket max is $4500, of which $0 met so far.  In-network SNF: $0 copay for days 1-20, a $160 daily copay for days 21-49, and a $0 copay for days 50-100.  Once out of pocket is reached, patient covered at 100% for remainder of 100 day benefit period.  Per rep, all 100 SNF available at time of call.  $0 copay for professional fees and 3 day hospital stay is not required.  Josem Kaufmann is required: [phone].    Non-emergent EMS transport: $225 copay for each one way medically necessary, Medicare covered trip.  Josem Kaufmann is not required for service code [id number] (non-emergent with no needs).  Patient eligible for [employer] non-emergent [place] transportation.   $0 copay for each transport.   Limited to 24 ground one way trips per year.  One companion over age of 82 years old can accompany patient during transports.  Wheelchair accessible Lucianne Lei available when notified.  [employer] benefit does not cover EMS, taxi or stretcher transports.  3 day notice required for routine transports, though same day service available to transport patients discharging from hospital (per rep, may take up to 3 hours to pick up patient).  To notify and schedule ride, contact Desha at [phone].

## 2017-09-23 NOTE — ED Notes (Signed)
Pt placed on bed pan to urinate. Pt repositioned in bed. Call light within reach.

## 2017-09-23 NOTE — ED Notes (Signed)
Patient assisted to use restroom. Patient moved to hospital bed and given breakfast tray and coffee.

## 2017-09-23 NOTE — ED Notes (Signed)
Pt sleeping at this time, lying on back with equal and unlabored resp noted.

## 2017-09-23 NOTE — ED Notes (Signed)
MD aware of patient's blood pressure. Will recheck 30 minutes after administration of PO hydralazine.

## 2017-09-23 NOTE — ED Notes (Signed)
Patient assisted to use bathroom for bowel movement. Able to ambulate to restroom with use of walker and standby assistance.

## 2017-09-23 NOTE — ED Notes (Signed)
Pt called out with urge to have BM, pt placed on toilet and given call light for when is ready to get up.

## 2017-09-23 NOTE — ED Notes (Signed)
Pt sleeping at this time, audible snoring can be heard with equal rise and fall of chest noted.

## 2017-09-23 NOTE — ED Notes (Signed)
Pt sleeping at this time, pt lying on back with audible snoring. Pt in NAD at this time.

## 2017-09-23 NOTE — ED Provider Notes (Signed)
 -----------------------------------------   1:02 PM on 09/23/2017 -----------------------------------------  Patient is calm and comfortable and stable in the ED today. Blood pressure has been very high today owing to patient's blood pressure medicines being held yesterday. She was given her medicines this morning persistently hypertensive. Did give her a low-dose clonidine to help bridge until her oral medications can regain control of her blood pressure which previously had been well managed. Patient has no new symptoms. There is gave report to the nursing facility where the patient will be discharged to, and they're comfortable with her current blood pressure levels. Patient is suitable for outpatient management at this time.  Final diagnoses:  Closed fracture of distal end of left radius, unspecified fracture morphology, initial encounter  Contusion of face, initial encounter  Hypertension, unspecified type      Sharman CheekStafford, Nycholas Rayner, MD 09/23/17 1303

## 2017-10-03 DIAGNOSIS — S52532A Colles' fracture of left radius, initial encounter for closed fracture: Secondary | ICD-10-CM | POA: Insufficient documentation

## 2017-11-04 ENCOUNTER — Other Ambulatory Visit: Payer: Self-pay

## 2017-11-18 ENCOUNTER — Ambulatory Visit: Payer: Self-pay | Admitting: Urology

## 2017-11-19 NOTE — Progress Notes (Signed)
11/20/2017 9:01 PM   Debbie Hampton 10-27-1931 [id number]  Referring provider: Kandyce Rud, MD [phone] [address] [facility] - [facility] [place], [place] [address]  Chief Complaint  Patient presents with  . New Patient (Initial Visit)    HPI: Patient is a 84 -year-old Caucasian female who is referred to [name] by Dr. Larwance Sachs for urinary incontinence with her youngest son, Debbie Hampton.    Patient states that she has had urinary incontinence for 9 weeks.  She states that her symptoms are most bothersome at night.  She stated that 9 weeks ago she had to sleep flat on her back due to her arm fracture.  This is when she noted her symptoms worsened.  She is experiencing nocturia x6.  She has a very strong urge to urinate.  She states that she is going through 4 pads at night.  She does not have a history of urinary tract infections, STI's or injury to the bladder.  She denies dysuria, gross hematuria, suprapubic pain, back pain, abdominal pain or flank pain.   She has been experiencing nausea, but she has not had any recent fevers, chills or vomiting.   She does not have a history of nephrolithiasis, GU surgery or GU trauma.   She is not sexually active.  She is post menopausal.   She admits to constipation.  She is not having pain with bladder filling.  She has not had any recent imaging studies.   She is drinking 64 ounces of water daily.   She admits to taking sips of water all through the night.  She is drinking 1 to 2 decaffeinated beverages daily.  She is not drinking any alcoholic beverages daily.  She has a glass of orange juice in the morning.  She drinks a little can of ginger ale daily.    Her PVR is 50 mL.     PMH: Past Medical History:  Diagnosis Date  . Bilateral shoulder pain   . Colon polyp   . Constipation   . GERD (gastroesophageal reflux disease)   . Hypertension   . Impaired fasting glucose   . Syncope 03/2016   with CHI  . Varicose  veins of both lower extremities     Surgical History: Past Surgical History:  Procedure Laterality Date  . TONSILLECTOMY    . TUMOR REMOVAL     ovaries    Home Medications:  Allergies as of 11/20/2017      Reactions   Mushroom Extract Complex    Penicillins    Has patient had a PCN reaction causing immediate rash, facial/tongue/throat swelling, SOB or lightheadedness with hypotension: Yes Has patient had a PCN reaction causing severe rash involving mucus membranes or skin necrosis: No Has patient had a PCN reaction that required hospitalization No Has patient had a PCN reaction occurring within the last 10 years: Yes If all of the above answers are "NO", then may proceed with Cephalosporin use.   Aloe Hives, Rash   Amlodipine Hives, Rash, Hypertension   Cephalexin Rash   Metoprolol Hives, Palpitations, Hypertension   Miconazole Swelling, Rash   Neosporin [neomycin-bacitracin Zn-polymyx] Rash   Trandolapril-verapamil Hcl Er Rash      Medication List        Accurate as of 11/20/17 11:59 PM. Always use your most recent med list.          aspirin 81 MG EC tablet Take 1 tablet (81 mg total) daily by mouth.  atenolol 100 MG tablet Commonly known as:  TENORMIN Take 1 tablet (100 mg total) by mouth daily.   citalopram 10 MG tablet Commonly known as:  CELEXA Take 10 mg by mouth daily.   clobetasol cream 0.05 % Commonly known as:  TEMOVATE Apply topically.   clopidogrel 75 MG tablet Commonly known as:  PLAVIX Take 1 tablet (75 mg total) by mouth daily.   diclofenac sodium 1 % Gel Commonly known as:  VOLTAREN Apply 2 g topically 3 (three) times daily.   hydrALAZINE 50 MG tablet Commonly known as:  APRESOLINE Take 1 tablet (50 mg total) by mouth 3 (three) times daily.   lisinopril 40 MG tablet Commonly known as:  PRINIVIL,ZESTRIL Take 1 tablet (40 mg total) by mouth daily.   pantoprazole 40 MG tablet Commonly known as:  PROTONIX Take 1 tablet (40 mg total) by  mouth daily.   polyethylene glycol packet Commonly known as:  MIRALAX / GLYCOLAX Take 17 g by mouth daily.   potassium chloride 10 MEQ tablet Commonly known as:  K-DUR,KLOR-CON Take 1 tablet (10 mEq total) by mouth 2 (two) times daily.   rosuvastatin 20 MG tablet Commonly known as:  CRESTOR Take 1 tablet (20 mg total) at bedtime by mouth.   spironolactone 25 MG tablet Commonly known as:  ALDACTONE Take by mouth.   Vitamin D 2000 units tablet Take by mouth.       Allergies:  Allergies  Allergen Reactions  . Mushroom Extract Complex   . Penicillins     Has patient had a PCN reaction causing immediate rash, facial/tongue/throat swelling, SOB or lightheadedness with hypotension: Yes Has patient had a PCN reaction causing severe rash involving mucus membranes or skin necrosis: No Has patient had a PCN reaction that required hospitalization No Has patient had a PCN reaction occurring within the last 10 years: Yes If all of the above answers are "NO", then may proceed with Cephalosporin use.   . Aloe Hives and Rash  . Amlodipine Hives, Rash and Hypertension  . Cephalexin Rash  . Metoprolol Hives, Palpitations and Hypertension  . Miconazole Swelling and Rash  . Neosporin [Neomycin-Bacitracin Zn-Polymyx] Rash  . Trandolapril-Verapamil Hcl Er Rash    Family History: Family History  Problem Relation Age of Onset  . Hypertension Other   . Breast cancer Sister   . Kidney cancer Neg Hx   . Kidney disease Neg Hx   . Prostate cancer Neg Hx     Social History:  reports that she has quit smoking. Her smoking use included cigarettes. she has never used smokeless tobacco. She reports that she does not drink alcohol or use drugs.  ROS: UROLOGY Frequent Urination?: Yes Hard to postpone urination?: Yes Burning/pain with urination?: No Get up at night to urinate?: Yes Leakage of urine?: Yes Urine stream starts and stops?: No Trouble starting stream?: No Do you have to strain  to urinate?: No Blood in urine?: No Urinary tract infection?: No Sexually transmitted disease?: No Injury to kidneys or bladder?: No Painful intercourse?: No Weak stream?: No Currently pregnant?: No Vaginal bleeding?: No Last menstrual period?: n  Gastrointestinal Nausea?: No Vomiting?: No Indigestion/heartburn?: Yes Diarrhea?: No Constipation?: Yes  Constitutional Fever: No Night sweats?: No Weight loss?: Yes Fatigue?: Yes  Skin Skin rash/lesions?: Yes Itching?: No  Eyes Blurred vision?: No Double vision?: No  Ears/Nose/Throat Sore throat?: No Sinus problems?: No  Hematologic/Lymphatic Swollen glands?: No Easy bruising?: Yes  Cardiovascular Leg swelling?: Yes Chest pain?: No  Respiratory  Cough?: No Shortness of breath?: No  Endocrine Excessive thirst?: No  Musculoskeletal Back pain?: Yes Joint pain?: Yes  Neurological Headaches?: Yes Dizziness?: No  Psychologic Depression?: No Anxiety?: No  Physical Exam: BP (!) 187/75   Pulse (!) 49   Ht 5' (1.524 m)   Wt 119 lb (54 kg)   BMI 23.24 kg/m   Constitutional: Well nourished. Alert and oriented, No acute distress. HEENT: [place] AT, moist mucus membranes. Trachea midline, no masses. Cardiovascular: No clubbing, cyanosis, or edema. Respiratory: Normal respiratory effort, no increased work of breathing. GI: Abdomen is soft, non tender, non distended, no abdominal masses. Liver and spleen not palpable.  No hernias appreciated.  Stool sample for occult testing is not indicated.   GU: No CVA tenderness.  No bladder fullness or masses.  Atrophic external genitalia, normal pubic hair distribution, no lesions.  Normal urethral meatus, no lesions, no prolapse, no discharge.   No urethral masses, tenderness and/or tenderness. No bladder fullness, tenderness or masses. Pale vagina mucosa, poor estrogen effect, no discharge, no lesions, good pelvic support, no cystocele or rectocele noted.  Introitus is  narrowed.  No cervical motion tenderness.  Uterus is freely mobile and non-fixed.  No adnexal/parametria masses or tenderness noted.  Anus and perineum are without rashes or lesions.    Skin: No rashes, bruises or suspicious lesions. Lymph: No cervical or inguinal adenopathy. Neurologic: Grossly intact, no focal deficits, moving all 4 extremities. Psychiatric: Normal mood and affect.  Laboratory Data: Lab Results  Component Value Date   WBC 10.3 09/21/2017   HGB 11.8 (L) 09/21/2017   HCT 35.2 09/21/2017   MCV 82.7 09/21/2017   PLT 271 09/21/2017    Lab Results  Component Value Date   CREATININE 0.83 09/21/2017    No results found for: PSA  No results found for: TESTOSTERONE  Lab Results  Component Value Date   HGBA1C 5.9 (H) 07/30/2017    No results found for: TSH     Component Value Date/Time   CHOL 186 07/30/2017 0513   HDL 70 07/30/2017 0513   CHOLHDL 2.7 07/30/2017 0513   VLDL 21 07/30/2017 0513   LDLCALC 95 07/30/2017 0513    Lab Results  Component Value Date   AST 33 08/02/2017   Lab Results  Component Value Date   ALT 25 08/02/2017   No components found for: ALKALINEPHOPHATASE No components found for: BILIRUBINTOTAL  No results found for: ESTRADIOL  Urinalysis    Component Value Date/Time   COLORURINE STRAW (A) 07/29/2017 1342   APPEARANCEUR Clear 11/20/2017 0854   LABSPEC 1.006 07/29/2017 1342   PHURINE 7.0 07/29/2017 1342   GLUCOSEU Negative 11/20/2017 0854   HGBUR NEGATIVE 07/29/2017 1342   BILIRUBINUR Negative 11/20/2017 0854   KETONESUR 5 (A) 07/29/2017 1342   PROTEINUR Negative 11/20/2017 0854   PROTEINUR NEGATIVE 07/29/2017 1342   NITRITE Negative 11/20/2017 0854   NITRITE NEGATIVE 07/29/2017 1342   LEUKOCYTESUR Negative 11/20/2017 0854    I have reviewed the labs.   Pertinent Imaging: Results for SHALENA, EZZELL (MRN [id number]) as of 11/23/2017 21:01  Ref. Range 11/20/2017 09:31  Scan Result Unknown 50     Assessment & Plan:     1. Nocturia  - I explained to the patient that nocturia is often multi-factorial and difficult to treat.  Sleeping disorders, heart conditions, peripheral vascular disease, diabetes, an enlarged prostate for men, an urethral stricture causing bladder outlet obstruction and/or certain medications can contribute to nocturia.  - I have  suggested that the patient avoid caffeine after noon and alcohol in the evening.  He or she may also benefit from fluid restrictions after 6:00 in the evening and voiding just prior to bedtime.  - I have explained that research studies have showed that over 84% of patients with sleep apnea reported frequent nighttime urination.   With sleep apnea, oxygen decreases, carbon dioxide increases, the blood become more acidic, the heart rate drops and blood vessels in the lung constrict.  The body is then alerted that something is very wrong. The sleeper must wake enough to reopen the airway. By this time, the heart is racing and experiences a false signal of fluid overload. The heart excretes a hormone-like protein that tells the body to get rid of sodium and water, resulting in nocturia my suspicious is that laying flat on her back has exacerbated her sleep apnea and advised her to undergo testing, her son states that there are family members with sleep apnea as well  - There is also an increased incidence in sleep apnea with menopause, symptoms include night sweats, daytime sleepiness, depressed mood, and cognitive complaints like poor concentration or problems with short-term memory   - The patient may benefit from a discussion with his or her primary care physician to see if he or she has risk factors for sleep apnea or other sleep disturbances and obtaining a sleep study.  2. Incontinence  - occurs mainly at night  - fluid management - discussed not drinking water through the night  - she is not a good candidate for medical therapy with anticholinergic therapy or beta-3  adrenergic receptor agonist as she is over the age of 84 and has uncontrolled HTN    - she is also not a candidate for desmopressin due to her history of hyponatremia  - RTC after being evaluated for sleep apnea  Return for follow up after sleep study.  These notes generated with voice recognition software. I apologize for typographical errors.  Michiel CowboySHANNON Shallen Luedke, PA-C  [facility] [address], [address] [place], [place] [address] [phone]

## 2017-11-20 ENCOUNTER — Encounter: Payer: Self-pay | Admitting: Urology

## 2017-11-20 ENCOUNTER — Ambulatory Visit: Payer: Medicare Other | Admitting: Urology

## 2017-11-20 VITALS — BP 187/75 | HR 49 | Ht 60.0 in | Wt 119.0 lb

## 2017-11-20 DIAGNOSIS — R350 Frequency of micturition: Secondary | ICD-10-CM | POA: Diagnosis not present

## 2017-11-20 DIAGNOSIS — R351 Nocturia: Secondary | ICD-10-CM

## 2017-11-20 DIAGNOSIS — N39498 Other specified urinary incontinence: Secondary | ICD-10-CM

## 2017-11-20 LAB — BLADDER SCAN AMB NON-IMAGING: Scan Result: 50

## 2017-11-20 NOTE — Patient Instructions (Addendum)

## 2017-11-21 LAB — URINALYSIS, COMPLETE
BILIRUBIN UA: NEGATIVE
Glucose, UA: NEGATIVE
KETONES UA: NEGATIVE
Leukocytes, UA: NEGATIVE
Nitrite, UA: NEGATIVE
PH UA: 7 (ref 5.0–7.5)
PROTEIN UA: NEGATIVE
RBC UA: NEGATIVE
SPEC GRAV UA: 1.015 (ref 1.005–1.030)
Urobilinogen, Ur: 0.2 mg/dL (ref 0.2–1.0)

## 2018-08-21 ENCOUNTER — Encounter: Payer: Self-pay | Admitting: Emergency Medicine

## 2018-08-21 ENCOUNTER — Inpatient Hospital Stay
Admission: EM | Admit: 2018-08-21 | Discharge: 2018-08-23 | DRG: 812 | Disposition: A | Discharge status: Home / Self Care | Payer: Medicare Other | Attending: Internal Medicine | Admitting: Internal Medicine

## 2018-08-21 ENCOUNTER — Other Ambulatory Visit: Payer: Self-pay

## 2018-08-21 DIAGNOSIS — K219 Gastro-esophageal reflux disease without esophagitis: Secondary | ICD-10-CM | POA: Diagnosis present

## 2018-08-21 DIAGNOSIS — D649 Anemia, unspecified: Secondary | ICD-10-CM

## 2018-08-21 DIAGNOSIS — Z8249 Family history of ischemic heart disease and other diseases of the circulatory system: Secondary | ICD-10-CM

## 2018-08-21 DIAGNOSIS — W19XXXA Unspecified fall, initial encounter: Secondary | ICD-10-CM | POA: Diagnosis present

## 2018-08-21 DIAGNOSIS — Z91048 Other nonmedicinal substance allergy status: Secondary | ICD-10-CM

## 2018-08-21 DIAGNOSIS — Z888 Allergy status to other drugs, medicaments and biological substances status: Secondary | ICD-10-CM | POA: Diagnosis not present

## 2018-08-21 DIAGNOSIS — H9193 Unspecified hearing loss, bilateral: Secondary | ICD-10-CM | POA: Diagnosis not present

## 2018-08-21 DIAGNOSIS — Z9181 History of falling: Secondary | ICD-10-CM | POA: Diagnosis not present

## 2018-08-21 DIAGNOSIS — Z7982 Long term (current) use of aspirin: Secondary | ICD-10-CM

## 2018-08-21 DIAGNOSIS — Z8673 Personal history of transient ischemic attack (TIA), and cerebral infarction without residual deficits: Secondary | ICD-10-CM

## 2018-08-21 DIAGNOSIS — E860 Dehydration: Secondary | ICD-10-CM

## 2018-08-21 DIAGNOSIS — E785 Hyperlipidemia, unspecified: Secondary | ICD-10-CM | POA: Diagnosis present

## 2018-08-21 DIAGNOSIS — Y92099 Unspecified place in other non-institutional residence as the place of occurrence of the external cause: Secondary | ICD-10-CM | POA: Diagnosis not present

## 2018-08-21 DIAGNOSIS — Z91018 Allergy to other foods: Secondary | ICD-10-CM | POA: Diagnosis not present

## 2018-08-21 DIAGNOSIS — D638 Anemia in other chronic diseases classified elsewhere: Secondary | ICD-10-CM | POA: Diagnosis not present

## 2018-08-21 DIAGNOSIS — S01511A Laceration without foreign body of lip, initial encounter: Secondary | ICD-10-CM

## 2018-08-21 DIAGNOSIS — E871 Hypo-osmolality and hyponatremia: Secondary | ICD-10-CM | POA: Diagnosis not present

## 2018-08-21 DIAGNOSIS — Z79899 Other long term (current) drug therapy: Secondary | ICD-10-CM

## 2018-08-21 DIAGNOSIS — D72829 Elevated white blood cell count, unspecified: Secondary | ICD-10-CM

## 2018-08-21 DIAGNOSIS — Z87891 Personal history of nicotine dependence: Secondary | ICD-10-CM

## 2018-08-21 DIAGNOSIS — Z803 Family history of malignant neoplasm of breast: Secondary | ICD-10-CM

## 2018-08-21 DIAGNOSIS — E861 Hypovolemia: Secondary | ICD-10-CM | POA: Diagnosis present

## 2018-08-21 DIAGNOSIS — Z880 Allergy status to penicillin: Secondary | ICD-10-CM

## 2018-08-21 DIAGNOSIS — Z7902 Long term (current) use of antithrombotics/antiplatelets: Secondary | ICD-10-CM

## 2018-08-21 DIAGNOSIS — Z66 Do not resuscitate: Secondary | ICD-10-CM | POA: Diagnosis present

## 2018-08-21 DIAGNOSIS — I10 Essential (primary) hypertension: Secondary | ICD-10-CM | POA: Diagnosis not present

## 2018-08-21 DIAGNOSIS — D62 Acute posthemorrhagic anemia: Principal | ICD-10-CM | POA: Diagnosis present

## 2018-08-21 DIAGNOSIS — E878 Other disorders of electrolyte and fluid balance, not elsewhere classified: Secondary | ICD-10-CM | POA: Diagnosis present

## 2018-08-21 DIAGNOSIS — S0990XA Unspecified injury of head, initial encounter: Secondary | ICD-10-CM

## 2018-08-21 NOTE — ED Notes (Addendum)
Pt reports starting to have neck pain, pt reports BP tends to run high systolic has been as high as  [id number]

## 2018-08-21 NOTE — ED Triage Notes (Signed)
[facility], pt has laceration to lower lip active bleeding pt takes blood thinners, pt reports was fixing blankets on her bed and fell swelling to left side of face noted and bruising, pt denies loss of consciousness,  Reports hit the floor and bit lower lip with top tooth, Pt hard of hearing did not bring hearing aids. Pt is awake alert and oriented

## 2018-08-22 ENCOUNTER — Emergency Department: Payer: Medicare Other

## 2018-08-22 DIAGNOSIS — Z9181 History of falling: Secondary | ICD-10-CM | POA: Diagnosis not present

## 2018-08-22 DIAGNOSIS — E785 Hyperlipidemia, unspecified: Secondary | ICD-10-CM | POA: Diagnosis not present

## 2018-08-22 DIAGNOSIS — Z87891 Personal history of nicotine dependence: Secondary | ICD-10-CM | POA: Diagnosis not present

## 2018-08-22 DIAGNOSIS — Z79899 Other long term (current) drug therapy: Secondary | ICD-10-CM | POA: Diagnosis not present

## 2018-08-22 DIAGNOSIS — D638 Anemia in other chronic diseases classified elsewhere: Secondary | ICD-10-CM | POA: Diagnosis not present

## 2018-08-22 DIAGNOSIS — Z7902 Long term (current) use of antithrombotics/antiplatelets: Secondary | ICD-10-CM | POA: Diagnosis not present

## 2018-08-22 DIAGNOSIS — W19XXXA Unspecified fall, initial encounter: Secondary | ICD-10-CM | POA: Diagnosis not present

## 2018-08-22 DIAGNOSIS — E871 Hypo-osmolality and hyponatremia: Secondary | ICD-10-CM | POA: Diagnosis not present

## 2018-08-22 DIAGNOSIS — Z8673 Personal history of transient ischemic attack (TIA), and cerebral infarction without residual deficits: Secondary | ICD-10-CM | POA: Diagnosis not present

## 2018-08-22 DIAGNOSIS — I10 Essential (primary) hypertension: Secondary | ICD-10-CM | POA: Diagnosis not present

## 2018-08-22 DIAGNOSIS — S01511A Laceration without foreign body of lip, initial encounter: Secondary | ICD-10-CM | POA: Diagnosis not present

## 2018-08-22 DIAGNOSIS — Y92099 Unspecified place in other non-institutional residence as the place of occurrence of the external cause: Secondary | ICD-10-CM | POA: Diagnosis not present

## 2018-08-22 DIAGNOSIS — Z66 Do not resuscitate: Secondary | ICD-10-CM | POA: Diagnosis not present

## 2018-08-22 DIAGNOSIS — E860 Dehydration: Secondary | ICD-10-CM | POA: Diagnosis present

## 2018-08-22 DIAGNOSIS — E878 Other disorders of electrolyte and fluid balance, not elsewhere classified: Secondary | ICD-10-CM | POA: Diagnosis not present

## 2018-08-22 DIAGNOSIS — H9193 Unspecified hearing loss, bilateral: Secondary | ICD-10-CM | POA: Diagnosis not present

## 2018-08-22 DIAGNOSIS — E861 Hypovolemia: Secondary | ICD-10-CM | POA: Diagnosis not present

## 2018-08-22 DIAGNOSIS — K219 Gastro-esophageal reflux disease without esophagitis: Secondary | ICD-10-CM | POA: Diagnosis not present

## 2018-08-22 DIAGNOSIS — Z7982 Long term (current) use of aspirin: Secondary | ICD-10-CM | POA: Diagnosis not present

## 2018-08-22 DIAGNOSIS — D62 Acute posthemorrhagic anemia: Secondary | ICD-10-CM | POA: Diagnosis not present

## 2018-08-22 LAB — PHOSPHORUS: Phosphorus: 3.7 mg/dL (ref 2.5–4.6)

## 2018-08-22 LAB — URINALYSIS, ROUTINE W REFLEX MICROSCOPIC
Bilirubin Urine: NEGATIVE
Glucose, UA: NEGATIVE mg/dL
Hgb urine dipstick: NEGATIVE
Ketones, ur: NEGATIVE mg/dL
Leukocytes, UA: NEGATIVE
Nitrite: NEGATIVE
PH: 5 (ref 5.0–8.0)
Protein, ur: NEGATIVE mg/dL
SPECIFIC GRAVITY, URINE: 1.038 — AB (ref 1.005–1.030)

## 2018-08-22 LAB — HEPATIC FUNCTION PANEL
ALBUMIN: 3.5 g/dL (ref 3.5–5.0)
ALT: 31 U/L (ref 0–44)
AST: 37 U/L (ref 15–41)
Alkaline Phosphatase: 69 U/L (ref 38–126)
Bilirubin, Direct: 0.1 mg/dL (ref 0.0–0.2)
Indirect Bilirubin: 0.4 mg/dL (ref 0.3–0.9)
Total Bilirubin: 0.5 mg/dL (ref 0.3–1.2)
Total Protein: 5.6 g/dL — ABNORMAL LOW (ref 6.5–8.1)

## 2018-08-22 LAB — HEMOGLOBIN
Hemoglobin: 8.1 g/dL — ABNORMAL LOW (ref 12.0–15.0)
Hemoglobin: 7.3 g/dL — ABNORMAL LOW (ref 12.0–15.0)

## 2018-08-22 LAB — PROTIME-INR
INR: 0.89
Prothrombin Time: 12 seconds (ref 11.4–15.2)

## 2018-08-22 LAB — APTT: aPTT: 32 seconds (ref 24–36)

## 2018-08-22 LAB — BASIC METABOLIC PANEL
Anion gap: 8 (ref 5–15)
BUN: 20 mg/dL (ref 8–23)
CO2: 26 mmol/L (ref 22–32)
Calcium: 9.6 mg/dL (ref 8.9–10.3)
Chloride: 96 mmol/L — ABNORMAL LOW (ref 98–111)
Creatinine, Ser: 0.83 mg/dL (ref 0.44–1.00)
GFR calc Af Amer: >60 mL/min (ref >60)
Glucose, Bld: 127 mg/dL — ABNORMAL HIGH (ref 70–99)
Potassium: 3.8 mmol/L (ref 3.5–5.1)
Sodium: 130 mmol/L — ABNORMAL LOW (ref 135–145)

## 2018-08-22 LAB — CBC WITH DIFFERENTIAL/PLATELET
Abs Immature Granulocytes: 0.07 10*3/uL (ref 0.00–0.07)
BASOS PCT: 1 %
Basophils Absolute: 0.1 10*3/uL (ref 0.0–0.1)
Eosinophils Absolute: 0.2 10*3/uL (ref 0.0–0.5)
Eosinophils Relative: 2 %
HCT: 31.4 % — ABNORMAL LOW (ref 36.0–46.0)
Hemoglobin: 9.8 g/dL — ABNORMAL LOW (ref 12.0–15.0)
Immature Granulocytes: 1 %
Lymphocytes Relative: 10 %
Lymphs Abs: 1.2 10*3/uL (ref 0.7–4.0)
MCH: 25.1 pg — ABNORMAL LOW (ref 26.0–34.0)
MCHC: 31.2 g/dL (ref 30.0–36.0)
MCV: 80.3 fL (ref 80.0–100.0)
Monocytes Absolute: 1.2 10*3/uL — ABNORMAL HIGH (ref 0.1–1.0)
Monocytes Relative: 10 %
Neutro Abs: 9.5 10*3/uL — ABNORMAL HIGH (ref 1.7–7.7)
Neutrophils Relative %: 76 %
Platelets: 268 10*3/uL (ref 150–400)
RBC: 3.91 MIL/uL (ref 3.87–5.11)
RDW: 13.8 % (ref 11.5–15.5)
WBC: 12.2 10*3/uL — ABNORMAL HIGH (ref 4.0–10.5)
nRBC: 0 % (ref 0.0–0.2)

## 2018-08-22 LAB — BRAIN NATRIURETIC PEPTIDE: B Natriuretic Peptide: 335 pg/mL — ABNORMAL HIGH (ref 0.0–100.0)

## 2018-08-22 LAB — PROCALCITONIN: Procalcitonin: <0.1 ng/mL

## 2018-08-22 LAB — MAGNESIUM: Magnesium: 1.9 mg/dL (ref 1.7–2.4)

## 2018-08-22 LAB — MRSA PCR SCREENING: MRSA by PCR: NEGATIVE

## 2018-08-22 LAB — PREALBUMIN: Prealbumin: 20.7 mg/dL (ref 18–38)

## 2018-08-22 MED ORDER — SPIRONOLACTONE 25 MG PO TABS
25.0000 mg | ORAL_TABLET | Freq: Every day | ORAL | Status: DC
  Administered 2018-08-22 – 2018-08-23 (×2): 25 mg via ORAL
  Filled 2018-08-22 (×2): qty 1

## 2018-08-22 MED ORDER — ONDANSETRON HCL 4 MG/2ML IJ SOLN: 4.0000 mg | Freq: Four times a day (QID) | INTRAMUSCULAR | Status: DC | PRN

## 2018-08-22 MED ORDER — IOHEXOL 300 MG/ML  SOLN
75.0000 mL | Freq: Once | INTRAMUSCULAR | Status: AC | PRN
  Administered 2018-08-22: 75 mL via INTRAVENOUS

## 2018-08-22 MED ORDER — MUPIROCIN 2 % EX OINT
TOPICAL_OINTMENT | Freq: Four times a day (QID) | CUTANEOUS | Status: DC
  Administered 2018-08-22 – 2018-08-23 (×5): via TOPICAL
  Filled 2018-08-22: qty 22

## 2018-08-22 MED ORDER — CLOPIDOGREL BISULFATE 75 MG PO TABS: 75.0000 mg | ORAL_TABLET | Freq: Every day | ORAL | Status: DC

## 2018-08-22 MED ORDER — ACETAMINOPHEN 325 MG PO TABS
650.0000 mg | ORAL_TABLET | Freq: Four times a day (QID) | ORAL | Status: DC | PRN
  Administered 2018-08-22: 650 mg via ORAL

## 2018-08-22 MED ORDER — ACETAMINOPHEN 650 MG RE SUPP: 650.0000 mg | Freq: Four times a day (QID) | RECTAL | Status: DC | PRN

## 2018-08-22 MED ORDER — SODIUM CHLORIDE 0.9 % IV BOLUS
500.0000 mL | Freq: Once | INTRAVENOUS | Status: AC
  Administered 2018-08-22: 500 mL via INTRAVENOUS

## 2018-08-22 MED ORDER — CITALOPRAM HYDROBROMIDE 20 MG PO TABS
10.0000 mg | ORAL_TABLET | Freq: Every day | ORAL | Status: DC
  Administered 2018-08-22 – 2018-08-23 (×2): 10 mg via ORAL
  Filled 2018-08-22 (×2): qty 1

## 2018-08-22 MED ORDER — MORPHINE SULFATE (PF) 2 MG/ML IV SOLN
2.0000 mg | INTRAVENOUS | Status: DC | PRN
  Administered 2018-08-22 – 2018-08-23 (×6): 2 mg via INTRAVENOUS
  Filled 2018-08-22 (×6): qty 1

## 2018-08-22 MED ORDER — SODIUM CHLORIDE 0.9 % IV SOLN
INTRAVENOUS | Status: AC
  Administered 2018-08-22: 06:00:00 via INTRAVENOUS

## 2018-08-22 MED ORDER — MORPHINE SULFATE (PF) 4 MG/ML IV SOLN: 4.0000 mg | INTRAVENOUS | Status: DC | PRN

## 2018-08-22 MED ORDER — LISINOPRIL 20 MG PO TABS
40.0000 mg | ORAL_TABLET | Freq: Every day | ORAL | Status: DC
  Administered 2018-08-22 – 2018-08-23 (×2): 40 mg via ORAL
  Filled 2018-08-22 (×2): qty 2

## 2018-08-22 MED ORDER — ROSUVASTATIN CALCIUM 10 MG PO TABS
20.0000 mg | ORAL_TABLET | Freq: Every day | ORAL | Status: DC
  Administered 2018-08-22: 20 mg via ORAL
  Filled 2018-08-22: qty 2

## 2018-08-22 MED ORDER — ACETAMINOPHEN 325 MG PO TABS
ORAL_TABLET | ORAL | Status: AC
  Filled 2018-08-22: qty 2

## 2018-08-22 MED ORDER — PANTOPRAZOLE SODIUM 40 MG PO TBEC
40.0000 mg | DELAYED_RELEASE_TABLET | Freq: Every day | ORAL | Status: DC
  Administered 2018-08-22 – 2018-08-23 (×2): 40 mg via ORAL
  Filled 2018-08-22 (×2): qty 1

## 2018-08-22 MED ORDER — ONDANSETRON HCL 4 MG PO TABS: 4.0000 mg | ORAL_TABLET | Freq: Four times a day (QID) | ORAL | Status: DC | PRN

## 2018-08-22 MED ORDER — HYDRALAZINE HCL 50 MG PO TABS
50.0000 mg | ORAL_TABLET | Freq: Three times a day (TID) | ORAL | Status: DC
  Administered 2018-08-22 – 2018-08-23 (×4): 50 mg via ORAL
  Filled 2018-08-22 (×4): qty 1

## 2018-08-22 MED ORDER — LIDOCAINE HCL (PF) 1 % IJ SOLN
INTRAMUSCULAR | Status: AC
  Filled 2018-08-22: qty 5

## 2018-08-22 MED ORDER — POLYETHYLENE GLYCOL 3350 17 G PO PACK
17.0000 g | PACK | Freq: Every day | ORAL | Status: DC
  Administered 2018-08-22 – 2018-08-23 (×2): 17 g via ORAL
  Filled 2018-08-22 (×2): qty 1

## 2018-08-22 MED ORDER — SENNOSIDES-DOCUSATE SODIUM 8.6-50 MG PO TABS
1.0000 | ORAL_TABLET | Freq: Every evening | ORAL | Status: DC | PRN
  Administered 2018-08-22: 1 via ORAL
  Filled 2018-08-22: qty 1

## 2018-08-22 MED ORDER — ASPIRIN EC 81 MG PO TBEC
81.0000 mg | DELAYED_RELEASE_TABLET | Freq: Every day | ORAL | Status: DC
  Administered 2018-08-22 – 2018-08-23 (×2): 81 mg via ORAL
  Filled 2018-08-22 (×2): qty 1

## 2018-08-22 MED ORDER — BISACODYL 5 MG PO TBEC
5.0000 mg | DELAYED_RELEASE_TABLET | Freq: Every day | ORAL | Status: DC | PRN
  Administered 2018-08-22: 5 mg via ORAL
  Filled 2018-08-22: qty 1

## 2018-08-22 MED ORDER — ATENOLOL 100 MG PO TABS
100.0000 mg | ORAL_TABLET | Freq: Every day | ORAL | Status: DC
  Administered 2018-08-22: 100 mg via ORAL
  Filled 2018-08-22 (×2): qty 1

## 2018-08-22 NOTE — ED Notes (Signed)
ED TO INPATIENT HANDOFF REPORT  Name/Age/Gender Debbie Hampton 84 y.o. female  Code Status Code Status History    Date Active Date Inactive Code Status Order ID Comments User Context   08/01/2017 1711 08/13/2017 2110 DNR [id number]  Jacquelynn Cree, PA-C Inpatient   08/01/2017 1710 08/01/2017 1711 DNR [id number]  Jacquelynn Cree, PA-C Inpatient   07/29/2017 1820 08/01/2017 1637 DNR [id number]  Shaune Pollack, MD Inpatient   03/31/2016 1523 04/03/2016 1750 Full Code [id number]  Hower, Cletis [place], MD ED    Questions for Most Recent Historical Code Status (Order [id number])    Question Answer Comment   In the event of cardiac or respiratory ARREST Do not call a "code blue"    In the event of cardiac or respiratory ARREST Do not perform Intubation, CPR, defibrillation or ACLS    In the event of cardiac or respiratory ARREST Use medication by any route, position, wound care, and other measures to relive pain and suffering. May use oxygen, suction and manual treatment of airway obstruction as needed for comfort.       Home/SNF/Other   Chief Complaint Laceration  Level of Care/Admitting Diagnosis ED Disposition    ED Disposition Condition Comment   Admit  Hospital Area: [facility] [[id number]]  Level of Care: Telemetry [5]  Diagnosis: Acute blood loss anemia [027253]  Admitting Physician: Barbaraann Rondo [[id number]]  Attending Physician: Barbaraann Rondo [[id number]]  Estimated length of stay: past midnight tomorrow  Certification:: I certify this patient will need inpatient services for at least 2 midnights  PT Class (Do Not Modify): Inpatient [101]  PT Acc Code (Do Not Modify): Private [1]       Medical History Past Medical History:  Diagnosis Date  . Bilateral shoulder pain   . Colon polyp   . Constipation   . GERD (gastroesophageal reflux disease)   . Hypertension   . Impaired fasting glucose   . Syncope 03/2016   with [facility]  . Varicose veins of both lower  extremities     Allergies Allergies  Allergen Reactions  . Mushroom Extract Complex   . Penicillins     Has patient had a PCN reaction causing immediate rash, facial/tongue/throat swelling, SOB or lightheadedness with hypotension: Yes Has patient had a PCN reaction causing severe rash involving mucus membranes or skin necrosis: No Has patient had a PCN reaction that required hospitalization No Has patient had a PCN reaction occurring within the last 10 years: Yes If all of the above answers are "NO", then may proceed with Cephalosporin use.   . Aloe Hives and Rash  . Amlodipine Hives, Rash and Hypertension  . Cephalexin Rash  . Metoprolol Hives, Palpitations and Hypertension  . Miconazole Swelling and Rash  . Neosporin [Neomycin-Bacitracin Zn-Polymyx] Rash  . Trandolapril-Verapamil Hcl Er Rash    IV Location/Drains/Wounds Patient Lines/Drains/Airways Status   Active Line/Drains/Airways    Name:   Placement date:   Placement time:   Site:   Days:   Peripheral IV 08/22/18 Right Antecubital   08/22/18    0033    Antecubital   less than 1   Wound / Incision (Open or Dehisced) 03/31/16 Laceration Face Left;Lower intact with sutures   03/31/16    -    Face   874          Labs/Imaging Results for orders placed or performed during the hospital encounter of 08/21/18 (from the past 48 hour(s))  CBC with Differential/Platelet     Status:  Abnormal   Collection Time: 08/22/18 12:21 AM  Result Value Ref Range   WBC 12.2 (H) 4.0 - 10.5 K/uL   RBC 3.91 3.87 - 5.11 MIL/uL   Hemoglobin 9.8 (L) 12.0 - 15.0 g/dL   HCT 30.831.4 (L) 65.736.0 - 84.646.0 %   MCV 80.3 80.0 - 100.0 fL   MCH 25.1 (L) 26.0 - 34.0 pg   MCHC 31.2 30.0 - 36.0 g/dL   RDW 96.213.8 95.211.5 - 84.115.5 %   Platelets 268 150 - 400 K/uL   nRBC 0.0 0.0 - 0.2 %   Neutrophils Relative % 76 %   Neutro Abs 9.5 (H) 1.7 - 7.7 K/uL   Lymphocytes Relative 10 %   Lymphs Abs 1.2 0.7 - 4.0 K/uL   Monocytes Relative 10 %   Monocytes Absolute 1.2 (H)  0.1 - 1.0 K/uL   Eosinophils Relative 2 %   Eosinophils Absolute 0.2 0.0 - 0.5 K/uL   Basophils Relative 1 %   Basophils Absolute 0.1 0.0 - 0.1 K/uL   Immature Granulocytes 1 %   Abs Immature Granulocytes 0.07 0.00 - 0.07 K/uL    Comment: Performed at [facility], [address] [place], [place] [address]  Basic metabolic panel     Status: Abnormal   Collection Time: 08/22/18 12:21 AM  Result Value Ref Range   Sodium 130 (L) 135 - 145 mmol/L   Potassium 3.8 3.5 - 5.1 mmol/L   Chloride 96 (L) 98 - 111 mmol/L   CO2 26 22 - 32 mmol/L   Glucose, Bld 127 (H) 70 - 99 mg/dL   BUN 20 8 - 23 mg/dL   Creatinine, Ser 1.020.83 0.44 - 1.00 mg/dL   Calcium 9.6 8.9 - 72.510.3 mg/dL   GFR calc non Af Amer >60 >60 mL/min   GFR calc Af Amer >60 >60 mL/min   Anion gap 8 5 - 15    Comment: Performed at [facility], [address] [place], [place] [address]  Protime-INR     Status: None   Collection Time: 08/22/18 12:21 AM  Result Value Ref Range   Prothrombin Time 12.0 11.4 - 15.2 seconds   INR 0.89     Comment: Performed at [facility], [address] [place], [place] [address]  APTT     Status: None   Collection Time: 08/22/18 12:21 AM  Result Value Ref Range   aPTT 32 24 - 36 seconds    Comment: Performed at [facility], [address] [place], [place] [address]  Brain natriuretic peptide     Status: Abnormal   Collection Time: 08/22/18 12:21 AM  Result Value Ref Range   B Natriuretic Peptide 335.0 (H) 0.0 - 100.0 pg/mL    Comment: Performed at [facility], [address] [place], [place] [address]   Ct Head Wo Contrast  Result Date: 08/22/2018 CLINICAL DATA:  [facility], pt has laceration to lower lip active bleeding pt takes blood thinners, pt reports was fixing blankets on her bed and fell swelling to left side of face noted and bruising, p.*comment was truncated*^6575mL OMNIPAQUE IOHEXOL 300  MG/ML SOLNHead trauma, intracranial venous injury suspected; Facial trauma, fx suspected; C-spine trauma, high clinical risk (NEXUS/CCR) EXAM: CT HEAD WITHOUT CONTRAST CT MAXILLOFACIAL WITHOUT CONTRAST CT CERVICAL SPINE WITHOUT CONTRAST TECHNIQUE: Multidetector CT imaging of the head, cervical spine, and maxillofacial structures were performed using the standard protocol without intravenous contrast. Multiplanar  CT image reconstructions of the cervical spine and maxillofacial structures were also generated. COMPARISON:  Head CT 09/21/2017 the FINDINGS: CT HEAD FINDINGS Brain: No intracranial hemorrhage. No parenchymal contusion. No midline shift or mass effect. Basilar cisterns are patent. No skull base fracture. No fluid in the paranasal sinuses or mastoid air cells. Orbits are normal. There are periventricular and subcortical white matter hypodensities. Generalized cortical atrophy. Deep white matter infarction in the RIGHT centrum semiovale. Vascular: No hyperdense vessel or unexpected calcification. Skull: Normal. Negative for fracture or focal lesion. Sinuses/Orbits: Mucosal thickening in the LEFT maxillary sinus. Orbits normal Other: None. CT MAXILLOFACIAL FINDINGS Osseous: No orbital fracture. Maxillary sinus walls are intact. There is thickening of the LEFT maxillary sinus wall related to chronic mucosal inflammation. Pterygoid plates are normal. Mandibular condyles located. No mandible fracture. Orbits: No proptosis. Some mild preseptal swelling on the LEFT. Intraconal contents are normal. Sinuses: Mucosal thickening in the LEFT maxillary sinus Soft tissues: small hematoma in theLEFT cheek (image 42/2). Hematoma in the soft tissue overlying this central mandible CT CERVICAL SPINE FINDINGS Alignment: Normal alignment of the cervical vertebral bodies. Skull base and vertebrae: Normal craniocervical junction. No loss of vertebral body height or disc height. Normal facet articulation. No evidence of fracture.  Soft tissues and spinal canal: No prevertebral soft tissue swelling. No perispinal or epidural hematoma. Disc levels: Disc space narrowing at C4-C5 C6-C7. Associated endplate osteophytosis. Upper chest: Clear Other: None IMPRESSION: 1. No intracranial trauma. 2. Soft tissue hematoma over the LEFT cheek. Mild preseptal swelling over the LEFT orbit. Hematoma midline anterior to the mandible symphysis. 3. No facial bone fracture. 4. Chronic sinus inflammation of the LEFT maxillary sinus. 5. No cervical spine fracture Electronically Signed   By: Genevive Bi M.D.   On: 08/22/2018 01:57   Ct Soft Tissue Neck W Contrast  Result Date: 08/22/2018 CLINICAL DATA:  Initial evaluation for acute trauma, fall. Contusion and laceration to lower lip. EXAM: CT NECK WITH CONTRAST TECHNIQUE: Multidetector CT imaging of the neck was performed using the standard protocol following the bolus administration of intravenous contrast. CONTRAST:  75mL OMNIPAQUE IOHEXOL 300 MG/ML  SOLN COMPARISON:  Concomitant CT of the face. FINDINGS: Pharynx and larynx: Oral cavity within normal limits. No base of tongue lesion. Oropharynx and nasopharynx within normal limits. No retropharyngeal collection. Epiglottis normal. Vallecula clear. Remainder of the hypopharynx and supraglottic larynx within normal limits. No acute abnormality about the glottis. Subglottic airway bowed to the right but widely patent and clear. Salivary glands: Salivary glands including the parotid and submandibular glands within normal limits. Thyroid: Thyroid heterogeneous with few scattered subcentimeter nodules present. Left lobe of thyroid asymmetrically enlarged as compared to the right. Lymph nodes: No adenopathy within the neck. Vascular: Extensive atherosclerotic change about the aortic arch without high-grade stenosis. Prominent carotid bifurcation atherosclerosis with associated narrowing of up to approximately 40% bilaterally. Great vessels remain patent within  the neck. No appreciable active arterial contrast extravasation about the left facial contusion, although evaluation somewhat limited by streak artifact from dental amalgam. Limited intracranial: Unremarkable. Visualized orbits: Visualized globes and orbital soft tissues demonstrate no acute finding. Mastoids and visualized paranasal sinuses: Chronic left maxillary sinusitis noted. Visualized paranasal sinuses are otherwise largely clear. Visualized mastoids and middle ear cavities are well pneumatized and free of fluid. Skeleton: No acute osseous abnormality identified. No discrete lytic or blastic osseous lesions. Moderate cervical spondylolysis noted throughout the cervical spine. Prominent periapical lucency at the right maxillary lateral incisor. Upper chest: Scattered  adenopathy within the visualized mediastinum, largest of which measures up to 2.1 cm in short axis at the precarinal level. Scattered atelectatic changes noted within the visualized lungs. Underlying interlobular septal thickening suggest mild pulmonary interstitial congestion/edema. Few small foci of tree-in-bud nodular densities within the peripheral right upper lobe suspicious for mild endobronchial infection (series 2, image 82). Other: Soft tissue contusion seen involving the left face with extensive soft tissue swelling about the lower lip. Superimposed laceration. Small multifocal hematoma within the subcutaneous fat of the left pre maxillary soft tissues measure up to 11 mm (series 2, image 22). No other discrete hematoma or drainable fluid collection. Again, no appreciable active contrast extravasation. IMPRESSION: 1. Soft tissue contusion with laceration involving the left face and lower lip. No appreciable active contrast extravasation identified. 2. Small area of scattered tree-in-bud nodular densities within the peripheral right upper lobe, suspicious for mild endobronchial infection. 3. Enlarged mediastinal adenopathy, nonspecific,  but could be reactive. 4. Moderate to advanced atherosclerotic change about the aortic arch and carotid bifurcations bilaterally. Associated narrowing of up to approximately 40% at the proximal right ICAs bilaterally. 5. Chronic left maxillary sinusitis. Electronically Signed   By: Rise Mu M.D.   On: 08/22/2018 01:33   Ct Chest Wo Contrast  Result Date: 08/22/2018 CLINICAL DATA:  84 year old female with acute respiratory distress. EXAM: CT CHEST WITHOUT CONTRAST TECHNIQUE: Multidetector CT imaging of the chest was performed following the standard protocol without IV contrast. COMPARISON:  Chest radiograph dated 08/22/2018 FINDINGS: Evaluation of this exam is limited in the absence of intravenous contrast. Cardiovascular: Mild cardiomegaly. Multi vessel coronary vascular calcification. No pericardial effusion. There is advanced atherosclerotic calcification of the thoracic aorta. The central pulmonary arteries are grossly unremarkable. Mediastinum/Nodes: No hilar or mediastinal adenopathy. There is a moderate size hiatal hernia. Bilateral thyroid nodules measure 18 mm on the left. Ultrasound may provide better evaluation. Lungs/Pleura: Pleural thickening versus trace right pleural effusion. There are bibasilar linear atelectasis/scarring. There is a cluster of nodular density in the right upper lobe, likely post inflammatory. Clinical correlation and follow-up recommended. There are biapical pleural thickening, right greater than left. No lobar consolidation or pneumothorax. The central airways are patent. Upper Abdomen: No acute abnormality. Musculoskeletal: Osteopenia. No acute osseous pathology. IMPRESSION: 1. A cluster of nodular density in the right upper lobe, likely post inflammatory. Clinical correlation and follow-up recommended. 2. Moderate size hiatal hernia. 3. Bilateral thyroid nodules. Ultrasound may provide better evaluation. Electronically Signed   By: Elgie Collard M.D.   On:  08/22/2018 04:44   Ct Cervical Spine Wo Contrast  Result Date: 08/22/2018 CLINICAL DATA:  Pt [facility] ER [facility] after fall, pt has laceration to lower lip active bleeding pt takes blood thinners, pt reports was fixing blankets on her bed and fell swelling to left side of face noted and bruising, p.*comment was truncated*^5mL OMNIPAQUE IOHEXOL 300 MG/ML SOLNHead trauma, intracranial venous injury suspected; Facial trauma, fx suspected; C-spine trauma, high clinical risk (NEXUS/CCR) EXAM: CT HEAD WITHOUT CONTRAST CT MAXILLOFACIAL WITHOUT CONTRAST CT CERVICAL SPINE WITHOUT CONTRAST TECHNIQUE: Multidetector CT imaging of the head, cervical spine, and maxillofacial structures were performed using the standard protocol without intravenous contrast. Multiplanar CT image reconstructions of the cervical spine and maxillofacial structures were also generated. COMPARISON:  Head CT 09/21/2017 the FINDINGS: CT HEAD FINDINGS Brain: No intracranial hemorrhage. No parenchymal contusion. No midline shift or mass effect. Basilar cisterns are patent. No skull base fracture. No fluid in the paranasal  sinuses or mastoid air cells. Orbits are normal. There are periventricular and subcortical white matter hypodensities. Generalized cortical atrophy. Deep white matter infarction in the RIGHT centrum semiovale. Vascular: No hyperdense vessel or unexpected calcification. Skull: Normal. Negative for fracture or focal lesion. Sinuses/Orbits: Mucosal thickening in the LEFT maxillary sinus. Orbits normal Other: None. CT MAXILLOFACIAL FINDINGS Osseous: No orbital fracture. Maxillary sinus walls are intact. There is thickening of the LEFT maxillary sinus wall related to chronic mucosal inflammation. Pterygoid plates are normal. Mandibular condyles located. No mandible fracture. Orbits: No proptosis. Some mild preseptal swelling on the LEFT. Intraconal contents are normal. Sinuses: Mucosal thickening in the LEFT maxillary  sinus Soft tissues: small hematoma in theLEFT cheek (image 42/2). Hematoma in the soft tissue overlying this central mandible CT CERVICAL SPINE FINDINGS Alignment: Normal alignment of the cervical vertebral bodies. Skull base and vertebrae: Normal craniocervical junction. No loss of vertebral body height or disc height. Normal facet articulation. No evidence of fracture. Soft tissues and spinal canal: No prevertebral soft tissue swelling. No perispinal or epidural hematoma. Disc levels: Disc space narrowing at C4-C5 C6-C7. Associated endplate osteophytosis. Upper chest: Clear Other: None IMPRESSION: 1. No intracranial trauma. 2. Soft tissue hematoma over the LEFT cheek. Mild preseptal swelling over the LEFT orbit. Hematoma midline anterior to the mandible symphysis. 3. No facial bone fracture. 4. Chronic sinus inflammation of the LEFT maxillary sinus. 5. No cervical spine fracture Electronically Signed   By: Genevive Bi M.D.   On: 08/22/2018 01:57   Dg Chest Portable 1 View  Result Date: 08/22/2018 CLINICAL DATA:  84 year old female with fall. EXAM: PORTABLE CHEST 1 VIEW COMPARISON:  Chest radiograph dated 03/31/2016 FINDINGS: The lungs are clear. There is no pleural effusion or pneumothorax. Mild cardiomegaly. Atherosclerotic calcification of the aortic arch. Minimal angulation of the distal left clavicle appears old. No acute osseous pathology. IMPRESSION: No active disease. Electronically Signed   By: Elgie Collard M.D.   On: 08/22/2018 04:01   Ct Maxillofacial Wo Contrast  Result Date: 08/22/2018 CLINICAL DATA:  Pt presents to ER via EMS from [facility] after fall, pt has laceration to lower lip active bleeding pt takes blood thinners, pt reports was fixing blankets on her bed and fell swelling to left side of face noted and bruising, p.*comment was truncated*^70mL OMNIPAQUE IOHEXOL 300 MG/ML SOLNHead trauma, intracranial venous injury suspected; Facial trauma, fx suspected; C-spine trauma, high  clinical risk (NEXUS/CCR) EXAM: CT HEAD WITHOUT CONTRAST CT MAXILLOFACIAL WITHOUT CONTRAST CT CERVICAL SPINE WITHOUT CONTRAST TECHNIQUE: Multidetector CT imaging of the head, cervical spine, and maxillofacial structures were performed using the standard protocol without intravenous contrast. Multiplanar CT image reconstructions of the cervical spine and maxillofacial structures were also generated. COMPARISON:  Head CT 09/21/2017 the FINDINGS: CT HEAD FINDINGS Brain: No intracranial hemorrhage. No parenchymal contusion. No midline shift or mass effect. Basilar cisterns are patent. No skull base fracture. No fluid in the paranasal sinuses or mastoid air cells. Orbits are normal. There are periventricular and subcortical white matter hypodensities. Generalized cortical atrophy. Deep white matter infarction in the RIGHT centrum semiovale. Vascular: No hyperdense vessel or unexpected calcification. Skull: Normal. Negative for fracture or focal lesion. Sinuses/Orbits: Mucosal thickening in the LEFT maxillary sinus. Orbits normal Other: None. CT MAXILLOFACIAL FINDINGS Osseous: No orbital fracture. Maxillary sinus walls are intact. There is thickening of the LEFT maxillary sinus wall related to chronic mucosal inflammation. Pterygoid plates are normal. Mandibular condyles located. No mandible fracture. Orbits: No proptosis. Some mild preseptal swelling  on the LEFT. Intraconal contents are normal. Sinuses: Mucosal thickening in the LEFT maxillary sinus Soft tissues: small hematoma in theLEFT cheek (image 42/2). Hematoma in the soft tissue overlying this central mandible CT CERVICAL SPINE FINDINGS Alignment: Normal alignment of the cervical vertebral bodies. Skull base and vertebrae: Normal craniocervical junction. No loss of vertebral body height or disc height. Normal facet articulation. No evidence of fracture. Soft tissues and spinal canal: No prevertebral soft tissue swelling. No perispinal or epidural hematoma. Disc  levels: Disc space narrowing at C4-C5 C6-C7. Associated endplate osteophytosis. Upper chest: Clear Other: None IMPRESSION: 1. No intracranial trauma. 2. Soft tissue hematoma over the LEFT cheek. Mild preseptal swelling over the LEFT orbit. Hematoma midline anterior to the mandible symphysis. 3. No facial bone fracture. 4. Chronic sinus inflammation of the LEFT maxillary sinus. 5. No cervical spine fracture Electronically Signed   By: Genevive Bi M.D.   On: 08/22/2018 01:57    Pending Labs Unresulted Labs (From admission, onward)    Start     Ordered   08/22/18 0800  Hemoglobin  Now then every 12 hours,   STAT     08/22/18 0421   08/22/18 0425  Procalcitonin - Baseline  Once,   STAT    Question:  Specimen collection method  Answer:  Unit=Unit collect   08/22/18 0424   08/22/18 0424  Hepatic function panel  Add-on,   AD    Question:  Specimen collection method  Answer:  Unit=Unit collect   08/22/18 0424   08/22/18 0424  Magnesium  Add-on,   AD    Question:  Specimen collection method  Answer:  Unit=Unit collect   08/22/18 0424   08/22/18 0424  Phosphorus  Add-on,   AD    Question:  Specimen collection method  Answer:  Unit=Unit collect   08/22/18 0424   08/22/18 0424  Prealbumin  Add-on,   AD    Question:  Specimen collection method  Answer:  Unit=Unit collect   08/22/18 0424   Signed and Held  Basic metabolic panel  Tomorrow morning,   R     Signed and Held          Vitals/Pain Today's Vitals   08/22/18 0330 08/22/18 0345 08/22/18 0430 08/22/18 0500  BP: (!) 104/54   (!) 149/54  Pulse: (!) 52  62 (!) 52  Resp: (!) 21 17 18 15   Temp:      TempSrc:      SpO2: 97%  98% 93%  Weight:      Height:      PainSc:        Isolation Precautions No active isolations  Medications Medications  lidocaine (PF) (XYLOCAINE) 1 % injection (has no administration in time range)  sodium chloride 0.9 % bolus 500 mL (500 mLs Intravenous Transfusing/Transfer 08/22/18 0520)  0.9 %  sodium  chloride infusion (has no administration in time range)  morphine 2 MG/ML injection 2 mg (has no administration in time range)    Or  morphine 4 MG/ML injection 4 mg (has no administration in time range)  acetaminophen (TYLENOL) tablet 650 mg (650 mg Oral Given 08/22/18 0500)    Or  acetaminophen (TYLENOL) suppository 650 mg ( Rectal See Alternative 08/22/18 0500)  acetaminophen (TYLENOL) 325 MG tablet (has no administration in time range)  iohexol (OMNIPAQUE) 300 MG/ML solution 75 mL (75 mLs Intravenous Contrast Given 08/22/18 0027)

## 2018-08-22 NOTE — ED Notes (Signed)
Dr. York CeriseForbach at bedside suturing pt's lower lip

## 2018-08-22 NOTE — Progress Notes (Signed)
Patient admitted this morning. Agree with ENT consult and case discussed with ENT Continue to monitor CBC Agree with plan by admitting MD

## 2018-08-22 NOTE — ED Notes (Signed)
Pt's son report pt had a stroke last yr left side deficit and fell in January suffered a fracture to left forearm.

## 2018-08-22 NOTE — Consult Note (Signed)
Bruce, Mayers [id number] [birth year] Adrian Saran, MD  Reason for Consult: lip laceration, hematoma  HPI: 84 y.o. Female with history of falls, fell again last night and injured her lip when hitting floor/biting lip.  Evaluated in ER with CT scans and had consistent bleeding from lower lip.  Sutured and small area of dehisence of lower lip packed with surgicel.  Asked to evaluate patient regarding further management.  Allergies:  Allergies  Allergen Reactions  . Mushroom Extract Complex   . Penicillins     Has patient had a PCN reaction causing immediate rash, facial/tongue/throat swelling, SOB or lightheadedness with hypotension: Yes Has patient had a PCN reaction causing severe rash involving mucus membranes or skin necrosis: No Has patient had a PCN reaction that required hospitalization No Has patient had a PCN reaction occurring within the last 10 years: Yes If all of the above answers are "NO", then may proceed with Cephalosporin use.   . Aloe Hives and Rash  . Amlodipine Hives, Rash and Hypertension  . Cephalexin Rash  . Metoprolol Hives, Palpitations and Hypertension  . Miconazole Swelling and Rash  . Neosporin [Neomycin-Bacitracin Zn-Polymyx] Rash  . Trandolapril-Verapamil Hcl Er Rash    ROS: Review of systems normal other than 12 systems except per HPI.  PMH:  Past Medical History:  Diagnosis Date  . Bilateral shoulder pain   . Colon polyp   . Constipation   . GERD (gastroesophageal reflux disease)   . Hypertension   . Impaired fasting glucose   . Syncope 03/2016   with [facility]  . Varicose veins of both lower extremities     FH:  Family History  Problem Relation Age of Onset  . Hypertension Other   . Breast cancer Sister   . Kidney cancer Neg Hx   . Kidney disease Neg Hx   . Prostate cancer Neg Hx     SH:  Social History   Socioeconomic History  . Marital status: Married    Spouse name: Not on file  . Number of children: Not on file  . Years of  education: Not on file  . Highest education level: Not on file  Occupational History  . Not on file  Social Needs  . Financial resource strain: Not on file  . Food insecurity:    Worry: Not on file    Inability: Not on file  . Transportation needs:    Medical: Not on file    Non-medical: Not on file  Tobacco Use  . Smoking status: Former Smoker    Types: Cigarettes  . Smokeless tobacco: Never Used  . Tobacco comment: quit 1985  Substance and Sexual Activity  . Alcohol use: No  . Drug use: No  . Sexual activity: Not on file  Lifestyle  . Physical activity:    Days per week: Not on file    Minutes per session: Not on file  . Stress: Not on file  Relationships  . Social connections:    Talks on phone: Not on file    Gets together: Not on file    Attends religious service: Not on file    Active member of club or organization: Not on file    Attends meetings of clubs or organizations: Not on file    Relationship status: Not on file  . Intimate partner violence:    Fear of current or ex partner: Not on file    Emotionally abused: Not on file    Physically abused: Not on  file    Forced sexual activity: Not on file  Other Topics Concern  . Not on file  Social History Narrative  . Not on file    PSH:  Past Surgical History:  Procedure Laterality Date  . TONSILLECTOMY    . TUMOR REMOVAL     ovaries    Physical  Exam:  GEN-  CN 2-12 grossly intact and symmetric. EARS-  External ears WNL OC/OP- dried blood on lips and in oral cavity, this was cleaned and revealed a hematoma of lower lip and stellate laceration of lower lip at midline that was previously closed with small area of surgicel packing in place.  No active bleeding noted NOSE-  Clear anteriorly FACE-  Left periorbital superficial hematoma and midline submental hematoma NECK-  No LAD, mild extension of submental hematoma anteriorly. No lymphadenopathy palpated. Thyroid normal with no masses.  CT-  No  fractures.  consitent with superficial hematoma preseptal of orbit, lip, and submental region  A/P: s/p fall with lip laceration and facial hematoma  Plan:  Conservative management given cessation of bleeding.  Will add glycerin swab and antibiotic ointment to apply to lower lip.  Patient also reports using wrist braces for carpel tunnel and wishes to have those as well.  Will like to see patient back in 1 week for evaluation of wound healing.  Packing that is in place is dissolvable and place ointment directed on dressing as well.   Mariana Goytia 08/22/2018 10:43 AM

## 2018-08-22 NOTE — Plan of Care (Signed)

## 2018-08-22 NOTE — ED Provider Notes (Signed)
[facility] Emergency Department Provider Note  ____________________________________________   First MD Initiated Contact with Patient 08/21/18 2356     (approximate)  I have reviewed the triage vital signs and the nursing notes.   HISTORY  Chief Complaint Fall and Laceration    HPI Debbie Hampton is a 84 y.o. female with medical history as listed below who presents by EMS for evaluation after a fall.  She has an obvious laceration to her lower lip as well as some bruising and swelling of her face and under her chin.  She reports that the chin hurts the worst.  She is continued to bleed from the lip because she reportedly takes Plavix.  She is also having some neck pain as time passes.  She has no numbness nor weakness in her extremities.  Her blood pressure is significantly elevated but she reports that this happens sometimes and she does have a history of "labile blood pressure".  She is not on any anticoagulants other than Plavix.  She reports that the fall happened acutely when she tried to go to the bathroom.  She did not have a loss of consciousness but reports that she hit the floor and her top teeth bit into the lower lip.  She is severely hard of hearing but when spoken to at an appropriate volume she is able to respond appropriately to questions.  She denies any injuries to her hands or legs.  The fall was apparently severe and acute in onset and nothing particular makes the symptoms better or worse.  Past Medical History:  Diagnosis Date  . Bilateral shoulder pain   . Colon polyp   . Constipation   . GERD (gastroesophageal reflux disease)   . Hypertension   . Impaired fasting glucose   . Syncope 03/2016   with [facility]  . Varicose veins of both lower extremities     Patient Active Problem List   Diagnosis Date Noted  . Labile blood pressure   . Benign essential HTN   . Hypertensive crisis   . Hyponatremia   . Prediabetes   . Hypoalbuminemia due to  protein-calorie malnutrition (HCC)   . Acute blood loss anemia   . 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 Acute CVA (cerebrovascular accident) (HCC) 07/29/2017  . Syncope, near 03/31/2016    Past Surgical History:  Procedure Laterality Date  . TONSILLECTOMY    . TUMOR REMOVAL     ovaries    Prior to Admission medications   Medication Sig Start Date End Date Taking? Authorizing Provider  acetaminophen (TYLENOL) 500 MG tablet Take 500 mg by mouth every 8 (eight) hours as needed.   Yes [provider]  aspirin EC 81 MG EC tablet Take 1 tablet (81 mg total) daily by mouth. 08/01/17  Yes Mody, Sital, MD  atenolol (TENORMIN) 100 MG tablet Take 1 tablet (100 mg total) by mouth daily. 08/13/17  Yes Love, Evlyn Kanner, PA-C  Cholecalciferol (VITAMIN D) 2000 units tablet Take 2,000 Units by mouth daily.    Yes [provider]  citalopram (CELEXA) 20 MG tablet Take 20 mg by mouth daily.    Yes [provider]  clobetasol cream (TEMOVATE) 0.05 % Apply 1 application topically 2 (two) times daily as needed. To back, buttocks, and private area 11/17/17  Yes [provider]  clopidogrel (PLAVIX) 75 MG tablet Take 1 tablet (75 mg total) by mouth daily. 08/13/17  Yes Love, Evlyn Kanner, PA-C  hydrALAZINE (APRESOLINE) 50 MG  tablet Take 1 tablet (50 mg total) by mouth 3 (three) times daily. 08/13/17  Yes Love, Evlyn Kanner, PA-C  lisinopril (PRINIVIL,ZESTRIL) 40 MG tablet Take 1 tablet (40 mg total) by mouth daily. 08/13/17  Yes Love, Evlyn Kanner, PA-C  Menthol, Topical Analgesic, (BIOFREEZE) 4 % GEL Apply 1 application topically 2 (two) times daily. To neck and shoulder   Yes [provider]  Multiple Vitamins-Minerals (CERTAVITE/ANTIOXIDANTS) TABS Take 1 tablet by mouth daily.   Yes [provider]  omega-3 acid ethyl esters (LOVAZA) 1 g capsule Take 1 g by mouth 2 (two) times daily.   Yes [provider]  pantoprazole (PROTONIX) 40 MG tablet Take 1 tablet (40  mg total) by mouth daily. 08/14/17  Yes Love, Evlyn Kanner, PA-C  Polyethyl Glycol-Propyl Glycol (SYSTANE ULTRA) 0.4-0.3 % SOLN Apply 1 drop to eye 3 (three) times daily.   Yes [provider]  polyethylene glycol (MIRALAX / GLYCOLAX) packet Take 17 g by mouth daily. Patient taking differently: Take 17 g by mouth 2 (two) times daily as needed.  08/14/17  Yes Love, Evlyn Kanner, PA-C  rosuvastatin (CRESTOR) 20 MG tablet Take 1 tablet (20 mg total) at bedtime by mouth. 08/01/17 08/22/18 Yes Mody, Sital, MD  senna-docusate (SENOKOT-S) 8.6-50 MG tablet Take 1 tablet by mouth daily as needed.   Yes [provider]  spironolactone (ALDACTONE) 25 MG tablet Take 12.5 mg by mouth daily.    Yes [provider]  traMADol (ULTRAM) 50 MG tablet Take 50 mg by mouth 3 (three) times daily as needed.   Yes [provider]  diclofenac sodium (VOLTAREN) 1 % GEL Apply 2 g topically 3 (three) times daily. Patient not taking: Reported on 08/22/2018 08/13/17   Love, Evlyn Kanner, PA-C  potassium chloride (K-DUR,KLOR-CON) 10 MEQ tablet Take 1 tablet (10 mEq total) by mouth 2 (two) times daily. Patient not taking: Reported on 09/22/2017 08/13/17   Jacquelynn Cree, PA-C    Allergies Mushroom extract complex; Penicillins; Aloe; Amlodipine; Cephalexin; Metoprolol; Miconazole; Neosporin [neomycin-bacitracin zn-polymyx]; and Trandolapril-verapamil hcl er  Family History  Problem Relation Age of Onset  . Hypertension Other   . Breast cancer Sister   . Kidney cancer Neg Hx   . Kidney disease Neg Hx   . Prostate cancer Neg Hx     Social History Social History   Tobacco Use  . Smoking status: Former Smoker    Types: Cigarettes  . Smokeless tobacco: Never Used  . Tobacco comment: quit 1985  Substance Use Topics  . Alcohol use: No  . Drug use: No    Review of Systems Constitutional: No fever/chills Eyes: No visual changes. ENT: No sore throat. Cardiovascular: Denies chest  pain. Respiratory: Denies shortness of breath. Gastrointestinal: No abdominal pain.  No nausea, no vomiting.  No diarrhea.  No constipation. Genitourinary: Negative for dysuria. Musculoskeletal: Pain in face, chin, and back of neck. Integumentary: Laceration to lower lip. Neurological: Chronically hard of hearing.  Negative for headaches, focal weakness or numbness.   ____________________________________________   PHYSICAL EXAM:  VITAL SIGNS: ED Triage Vitals  Enc Vitals Group     BP 08/21/18 2343 (!) 235/87     Pulse Rate 08/21/18 2343 64     Resp 08/21/18 2343 20     Temp 08/21/18 2343 98 F (36.7 C)     Temp Source 08/21/18 2343 Oral     SpO2 08/21/18 2343 (!) 64 %     Weight 08/21/18 2345 54.4 kg (120  lb)     Height 08/21/18 2345 1.549 m (5\' 1" )     Head Circumference --      Peak Flow --      Pain Score 08/21/18 2344 7     Pain Loc --      Pain Edu? --      Excl. in GC? --     Constitutional: Alert and oriented.  Obvious traumatic injury but not in severe distress. Eyes: Conjunctivae are normal. PERRL. EOMI. Head: Obvious contusions and ecchymosis around the left eye and left cheek as well as swelling and bleeding to the lower lip and hematoma and ecchymosis underneath her chin. Nose: No congestion/rhinnorhea. Mouth/Throat: Mucous membranes are moist.  Dried blood is present in the mouth due to the lower lip laceration.  There is maceration and a deep laceration across the mucosal surface of the middle of the lower lip.  Due to the significant swelling of the lower lip, the laceration extends at least 3 cm and is deep but is not full-thickness.  The maceration means that the sides do not exactly lined up and there was 1 piece of tissue at the inferior margin of the laceration that is missing.  The patient has no tenderness to palpation of the teeth and no loose teeth.  No evidence of tongue injury.  There is a small arterial bleed that is pumping blood from the right side  of the inferior aspect of the lip lac. Neck: No stridor.  No meningeal signs.  No cervical spine tenderness to palpation.  Although she has some swelling under the chin she does not have an expanding neck hematoma at this time. Cardiovascular: Normal rate, regular rhythm. Good peripheral circulation. Grossly normal heart sounds. Respiratory: Normal respiratory effort.  No retractions. Lungs CTAB. Gastrointestinal: Soft and nontender. No distention.  Musculoskeletal: No lower extremity tenderness nor edema. No gross deformities of extremities.  No evidence of extremity injury. Neurologic:  Normal speech and language.  She has chronic left-sided deficits after a stroke last year but no acute changes according to the patient and her family. Skin:  Skin is warm, dry and intact except for lip laceration as described above. Psychiatric: Mood and affect are normal. Speech and behavior are normal.  ____________________________________________   LABS (all labs ordered are listed, but only abnormal results are displayed)  Labs Reviewed  CBC WITH DIFFERENTIAL/PLATELET - Abnormal; Notable for the following components:      Result Value   WBC 12.2 (*)    Hemoglobin 9.8 (*)    HCT 31.4 (*)    MCH 25.1 (*)    Neutro Abs 9.5 (*)    Monocytes Absolute 1.2 (*)    All other components within normal limits  BASIC METABOLIC PANEL - Abnormal; Notable for the following components:   Sodium 130 (*)    Chloride 96 (*)    Glucose, Bld 127 (*)    All other components within normal limits  BRAIN NATRIURETIC PEPTIDE - Abnormal; Notable for the following components:   B Natriuretic Peptide 335.0 (*)    All other components within normal limits  PROTIME-INR  APTT  HEMOGLOBIN  HEMOGLOBIN  HEPATIC FUNCTION PANEL  MAGNESIUM  PHOSPHORUS  PREALBUMIN  PROCALCITONIN   ____________________________________________  EKG  No indication for ECG ____________________________________________  RADIOLOGY   ED  MD interpretation: No acute injuries evident on CT scans.  No obvious evidence of infection on CT chest, question possible postinflammatory response.  Chest x-ray is unremarkable.  Official  radiology report(s): Ct Head Wo Contrast  Result Date: 08/22/2018 CLINICAL DATA:  Pt presents to ER via EMS from [facility] after fall, pt has laceration to lower lip active bleeding pt takes blood thinners, pt reports was fixing blankets on her bed and fell swelling to left side of face noted and bruising, p.*comment was truncated*^37mL OMNIPAQUE IOHEXOL 300 MG/ML SOLNHead trauma, intracranial venous injury suspected; Facial trauma, fx suspected; C-spine trauma, high clinical risk (NEXUS/CCR) EXAM: CT HEAD WITHOUT CONTRAST CT MAXILLOFACIAL WITHOUT CONTRAST CT CERVICAL SPINE WITHOUT CONTRAST TECHNIQUE: Multidetector CT imaging of the head, cervical spine, and maxillofacial structures were performed using the standard protocol without intravenous contrast. Multiplanar CT image reconstructions of the cervical spine and maxillofacial structures were also generated. COMPARISON:  Head CT 09/21/2017 the FINDINGS: CT HEAD FINDINGS Brain: No intracranial hemorrhage. No parenchymal contusion. No midline shift or mass effect. Basilar cisterns are patent. No skull base fracture. No fluid in the paranasal sinuses or mastoid air cells. Orbits are normal. There are periventricular and subcortical white matter hypodensities. Generalized cortical atrophy. Deep white matter infarction in the RIGHT centrum semiovale. Vascular: No hyperdense vessel or unexpected calcification. Skull: Normal. Negative for fracture or focal lesion. Sinuses/Orbits: Mucosal thickening in the LEFT maxillary sinus. Orbits normal Other: None. CT MAXILLOFACIAL FINDINGS Osseous: No orbital fracture. Maxillary sinus walls are intact. There is thickening of the LEFT maxillary sinus wall related to chronic mucosal inflammation. Pterygoid plates are normal. Mandibular  condyles located. No mandible fracture. Orbits: No proptosis. Some mild preseptal swelling on the LEFT. Intraconal contents are normal. Sinuses: Mucosal thickening in the LEFT maxillary sinus Soft tissues: small hematoma in theLEFT cheek (image 42/2). Hematoma in the soft tissue overlying this central mandible CT CERVICAL SPINE FINDINGS Alignment: Normal alignment of the cervical vertebral bodies. Skull base and vertebrae: Normal craniocervical junction. No loss of vertebral body height or disc height. Normal facet articulation. No evidence of fracture. Soft tissues and spinal canal: No prevertebral soft tissue swelling. No perispinal or epidural hematoma. Disc levels: Disc space narrowing at C4-C5 C6-C7. Associated endplate osteophytosis. Upper chest: Clear Other: None IMPRESSION: 1. No intracranial trauma. 2. Soft tissue hematoma over the LEFT cheek. Mild preseptal swelling over the LEFT orbit. Hematoma midline anterior to the mandible symphysis. 3. No facial bone fracture. 4. Chronic sinus inflammation of the LEFT maxillary sinus. 5. No cervical spine fracture Electronically Signed   By: Genevive Bi M.D.   On: 08/22/2018 01:57   Ct Soft Tissue Neck W Contrast  Result Date: 08/22/2018 CLINICAL DATA:  Initial evaluation for acute trauma, fall. Contusion and laceration to lower lip. EXAM: CT NECK WITH CONTRAST TECHNIQUE: Multidetector CT imaging of the neck was performed using the standard protocol following the bolus administration of intravenous contrast. CONTRAST:  75mL OMNIPAQUE IOHEXOL 300 MG/ML  SOLN COMPARISON:  Concomitant CT of the face. FINDINGS: Pharynx and larynx: Oral cavity within normal limits. No base of tongue lesion. Oropharynx and nasopharynx within normal limits. No retropharyngeal collection. Epiglottis normal. Vallecula clear. Remainder of the hypopharynx and supraglottic larynx within normal limits. No acute abnormality about the glottis. Subglottic airway bowed to the right but  widely patent and clear. Salivary glands: Salivary glands including the parotid and submandibular glands within normal limits. Thyroid: Thyroid heterogeneous with few scattered subcentimeter nodules present. Left lobe of thyroid asymmetrically enlarged as compared to the right. Lymph nodes: No adenopathy within the neck. Vascular: Extensive atherosclerotic change about the aortic arch without high-grade stenosis. Prominent carotid bifurcation atherosclerosis with  associated narrowing of up to approximately 40% bilaterally. Great vessels remain patent within the neck. No appreciable active arterial contrast extravasation about the left facial contusion, although evaluation somewhat limited by streak artifact from dental amalgam. Limited intracranial: Unremarkable. Visualized orbits: Visualized globes and orbital soft tissues demonstrate no acute finding. Mastoids and visualized paranasal sinuses: Chronic left maxillary sinusitis noted. Visualized paranasal sinuses are otherwise largely clear. Visualized mastoids and middle ear cavities are well pneumatized and free of fluid. Skeleton: No acute osseous abnormality identified. No discrete lytic or blastic osseous lesions. Moderate cervical spondylolysis noted throughout the cervical spine. Prominent periapical lucency at the right maxillary lateral incisor. Upper chest: Scattered adenopathy within the visualized mediastinum, largest of which measures up to 2.1 cm in short axis at the precarinal level. Scattered atelectatic changes noted within the visualized lungs. Underlying interlobular septal thickening suggest mild pulmonary interstitial congestion/edema. Few small foci of tree-in-bud nodular densities within the peripheral right upper lobe suspicious for mild endobronchial infection (series 2, image 82). Other: Soft tissue contusion seen involving the left face with extensive soft tissue swelling about the lower lip. Superimposed laceration. Small multifocal  hematoma within the subcutaneous fat of the left pre maxillary soft tissues measure up to 11 mm (series 2, image 22). No other discrete hematoma or drainable fluid collection. Again, no appreciable active contrast extravasation. IMPRESSION: 1. Soft tissue contusion with laceration involving the left face and lower lip. No appreciable active contrast extravasation identified. 2. Small area of scattered tree-in-bud nodular densities within the peripheral right upper lobe, suspicious for mild endobronchial infection. 3. Enlarged mediastinal adenopathy, nonspecific, but could be reactive. 4. Moderate to advanced atherosclerotic change about the aortic arch and carotid bifurcations bilaterally. Associated narrowing of up to approximately 40% at the proximal right ICAs bilaterally. 5. Chronic left maxillary sinusitis. Electronically Signed   By: Rise Mu M.D.   On: 08/22/2018 01:33   Ct Chest Wo Contrast  Result Date: 08/22/2018 CLINICAL DATA:  84 year old female with acute respiratory distress. EXAM: CT CHEST WITHOUT CONTRAST TECHNIQUE: Multidetector CT imaging of the chest was performed following the standard protocol without IV contrast. COMPARISON:  Chest radiograph dated 08/22/2018 FINDINGS: Evaluation of this exam is limited in the absence of intravenous contrast. Cardiovascular: Mild cardiomegaly. Multi vessel coronary vascular calcification. No pericardial effusion. There is advanced atherosclerotic calcification of the thoracic aorta. The central pulmonary arteries are grossly unremarkable. Mediastinum/Nodes: No hilar or mediastinal adenopathy. There is a moderate size hiatal hernia. Bilateral thyroid nodules measure 18 mm on the left. Ultrasound may provide better evaluation. Lungs/Pleura: Pleural thickening versus trace right pleural effusion. There are bibasilar linear atelectasis/scarring. There is a cluster of nodular density in the right upper lobe, likely post inflammatory. Clinical  correlation and follow-up recommended. There are biapical pleural thickening, right greater than left. No lobar consolidation or pneumothorax. The central airways are patent. Upper Abdomen: No acute abnormality. Musculoskeletal: Osteopenia. No acute osseous pathology. IMPRESSION: 1. A cluster of nodular density in the right upper lobe, likely post inflammatory. Clinical correlation and follow-up recommended. 2. Moderate size hiatal hernia. 3. Bilateral thyroid nodules. Ultrasound may provide better evaluation. Electronically Signed   By: Elgie Collard M.D.   On: 08/22/2018 04:44   Ct Cervical Spine Wo Contrast  Result Date: 08/22/2018 CLINICAL DATA:  Pt presents to ER via EMS from [facility] after fall, pt has laceration to lower lip active bleeding pt takes blood thinners, pt reports was fixing blankets on her bed and fell swelling to left  side of face noted and bruising, p.*comment was truncated*^52mL OMNIPAQUE IOHEXOL 300 MG/ML SOLNHead trauma, intracranial venous injury suspected; Facial trauma, fx suspected; C-spine trauma, high clinical risk (NEXUS/CCR) EXAM: CT HEAD WITHOUT CONTRAST CT MAXILLOFACIAL WITHOUT CONTRAST CT CERVICAL SPINE WITHOUT CONTRAST TECHNIQUE: Multidetector CT imaging of the head, cervical spine, and maxillofacial structures were performed using the standard protocol without intravenous contrast. Multiplanar CT image reconstructions of the cervical spine and maxillofacial structures were also generated. COMPARISON:  Head CT 09/21/2017 the FINDINGS: CT HEAD FINDINGS Brain: No intracranial hemorrhage. No parenchymal contusion. No midline shift or mass effect. Basilar cisterns are patent. No skull base fracture. No fluid in the paranasal sinuses or mastoid air cells. Orbits are normal. There are periventricular and subcortical white matter hypodensities. Generalized cortical atrophy. Deep white matter infarction in the RIGHT centrum semiovale. Vascular: No hyperdense vessel or  unexpected calcification. Skull: Normal. Negative for fracture or focal lesion. Sinuses/Orbits: Mucosal thickening in the LEFT maxillary sinus. Orbits normal Other: None. CT MAXILLOFACIAL FINDINGS Osseous: No orbital fracture. Maxillary sinus walls are intact. There is thickening of the LEFT maxillary sinus wall related to chronic mucosal inflammation. Pterygoid plates are normal. Mandibular condyles located. No mandible fracture. Orbits: No proptosis. Some mild preseptal swelling on the LEFT. Intraconal contents are normal. Sinuses: Mucosal thickening in the LEFT maxillary sinus Soft tissues: small hematoma in theLEFT cheek (image 42/2). Hematoma in the soft tissue overlying this central mandible CT CERVICAL SPINE FINDINGS Alignment: Normal alignment of the cervical vertebral bodies. Skull base and vertebrae: Normal craniocervical junction. No loss of vertebral body height or disc height. Normal facet articulation. No evidence of fracture. Soft tissues and spinal canal: No prevertebral soft tissue swelling. No perispinal or epidural hematoma. Disc levels: Disc space narrowing at C4-C5 C6-C7. Associated endplate osteophytosis. Upper chest: Clear Other: None IMPRESSION: 1. No intracranial trauma. 2. Soft tissue hematoma over the LEFT cheek. Mild preseptal swelling over the LEFT orbit. Hematoma midline anterior to the mandible symphysis. 3. No facial bone fracture. 4. Chronic sinus inflammation of the LEFT maxillary sinus. 5. No cervical spine fracture Electronically Signed   By: Genevive Bi M.D.   On: 08/22/2018 01:57   Dg Chest Portable 1 View  Result Date: 08/22/2018 CLINICAL DATA:  84 year old female with fall. EXAM: PORTABLE CHEST 1 VIEW COMPARISON:  Chest radiograph dated 03/31/2016 FINDINGS: The lungs are clear. There is no pleural effusion or pneumothorax. Mild cardiomegaly. Atherosclerotic calcification of the aortic arch. Minimal angulation of the distal left clavicle appears old. No acute osseous  pathology. IMPRESSION: No active disease. Electronically Signed   By: Elgie Collard M.D.   On: 08/22/2018 04:01   Ct Maxillofacial Wo Contrast  Result Date: 08/22/2018 CLINICAL DATA:  Pt presents to ER via EMS from [facility] after fall, pt has laceration to lower lip active bleeding pt takes blood thinners, pt reports was fixing blankets on her bed and fell swelling to left side of face noted and bruising, p.*comment was truncated*^45mL OMNIPAQUE IOHEXOL 300 MG/ML SOLNHead trauma, intracranial venous injury suspected; Facial trauma, fx suspected; C-spine trauma, high clinical risk (NEXUS/CCR) EXAM: CT HEAD WITHOUT CONTRAST CT MAXILLOFACIAL WITHOUT CONTRAST CT CERVICAL SPINE WITHOUT CONTRAST TECHNIQUE: Multidetector CT imaging of the head, cervical spine, and maxillofacial structures were performed using the standard protocol without intravenous contrast. Multiplanar CT image reconstructions of the cervical spine and maxillofacial structures were also generated. COMPARISON:  Head CT 09/21/2017 the FINDINGS: CT HEAD FINDINGS Brain: No intracranial hemorrhage. No parenchymal contusion. No midline shift  or mass effect. Basilar cisterns are patent. No skull base fracture. No fluid in the paranasal sinuses or mastoid air cells. Orbits are normal. There are periventricular and subcortical white matter hypodensities. Generalized cortical atrophy. Deep white matter infarction in the RIGHT centrum semiovale. Vascular: No hyperdense vessel or unexpected calcification. Skull: Normal. Negative for fracture or focal lesion. Sinuses/Orbits: Mucosal thickening in the LEFT maxillary sinus. Orbits normal Other: None. CT MAXILLOFACIAL FINDINGS Osseous: No orbital fracture. Maxillary sinus walls are intact. There is thickening of the LEFT maxillary sinus wall related to chronic mucosal inflammation. Pterygoid plates are normal. Mandibular condyles located. No mandible fracture. Orbits: No proptosis. Some mild preseptal  swelling on the LEFT. Intraconal contents are normal. Sinuses: Mucosal thickening in the LEFT maxillary sinus Soft tissues: small hematoma in theLEFT cheek (image 42/2). Hematoma in the soft tissue overlying this central mandible CT CERVICAL SPINE FINDINGS Alignment: Normal alignment of the cervical vertebral bodies. Skull base and vertebrae: Normal craniocervical junction. No loss of vertebral body height or disc height. Normal facet articulation. No evidence of fracture. Soft tissues and spinal canal: No prevertebral soft tissue swelling. No perispinal or epidural hematoma. Disc levels: Disc space narrowing at C4-C5 C6-C7. Associated endplate osteophytosis. Upper chest: Clear Other: None IMPRESSION: 1. No intracranial trauma. 2. Soft tissue hematoma over the LEFT cheek. Mild preseptal swelling over the LEFT orbit. Hematoma midline anterior to the mandible symphysis. 3. No facial bone fracture. 4. Chronic sinus inflammation of the LEFT maxillary sinus. 5. No cervical spine fracture Electronically Signed   By: Genevive Bi M.D.   On: 08/22/2018 01:57    ____________________________________________   PROCEDURES  Critical Care performed: No   Procedure(s) performed:   Marland KitchenMarland KitchenLaceration Repair Date/Time: 08/22/2018 5:14 AM Performed by: Loleta Rose, MD Authorized by: Loleta Rose, MD   Consent:    Consent obtained:  Verbal   Consent given by:  Patient   Risks discussed:  Infection, pain, retained foreign body, poor cosmetic result, poor wound healing, need for additional repair and vascular damage Anesthesia (see MAR for exact dosages):    Anesthesia method:  Local infiltration   Local anesthetic:  Lidocaine 1% w/o epi Laceration details:    Location:  Lip   Lip location:  Lower interior lip   Length (cm):  3 Repair type:    Repair type:  Complex Exploration:    Limited defect created (wound extended): no     Hemostasis achieved with:  Direct pressure and tied off vessels   Wound  exploration: entire depth of wound probed and visualized     Wound extent: vascular damage     Wound extent comment:  Small arterial bleed (a "pumper")   Contaminated: no   Treatment:    Area cleansed with:  Saline   Irrigation solution:  Sterile saline   Visualized foreign bodies/material removed: no   Skin repair:    Repair method:  Sutures   Suture size:  5-0   Wound skin closure material used: Vicryl Rapide.   Suture technique:  Retention suture, subcuticular and figure eight   Number of sutures:  5 Approximation:    Approximation:  Close Post-procedure details:    Dressing:  Sterile dressing   Patient tolerance of procedure:  Tolerated well, no immediate complications     ____________________________________________   INITIAL IMPRESSION / ASSESSMENT AND PLAN / ED COURSE  As part of my medical decision making, I reviewed the following data within the electronic MEDICAL RECORD NUMBER History obtained from family, Nursing  notes reviewed and incorporated, Labs reviewed , ,Old chart reviewed, Radiograph reviewed  and Discussed with admitting physician     Differential diagnosis includes, but is not limited to, mechanical fall leading to lip laceration, intracranial hemorrhage, bony injury including C-spine fracture or facial fracture.  Given the fact she is on Plavix, bleeding copiously from her lower lip, and has significant hematomas including underneath her chin, I am going to CT her rapidly to make sure there is no sign of active extravasation or expanding neck hematoma.  There is no indication for emergent intubation at this time and she is protecting her airway and still has relatively supple neck and not firm induration under the tongue or under the chin but she does need emergent imaging.  Her family confirms she has no history of renal dysfunction and I am proceeding without labs.      Clinical Course as of Aug 23 519  Sat Aug 22, 2018  0013 Called CT to let them know we  need the scans emergently and not to wait for renal function.  Arnell agreed and will come get the patient shortly.   [CF]  0200 Of note, the patient is anemic at 9.8 although I do not believe this represents her total blood loss given that she has a small arterial bleed in her lower lip.  She has a mild leukocytosis of 12.2 which is of unknown significance.  She has an elevated BUN to creatinine ratio even though her creatinine is normal and hyponatremia at 130 which is suggestive of dehydration for which I am giving 500 mL normal saline IV bolus.  Coagulation studies are normal.  BNP is slightly elevated at 335 which is of uncertain significance.  Patient is sleeping comfortably but her lip is still oozing blood and I will address that shortly.   [CF]  0208 CT scans fortunately are reassuring with no evidence of expanding neck hematoma or extravasation and no evidence of fracture in the skull, face, or cervical spine.  The patient is currently sleeping comfortably.   [CF]  0355 Finish laceration repair.  The patient had an small arterial bleeder in the middle of the lower lip laceration that resulted in a significant amount of blood loss.  After lack repair and Surgicel application it seems to have stopped bleeding.  Given the blood loss and the large hematomas on her face and beneath her chin, her hyponatremia, the question of acute infection visualized partially on the neck CT (chest CT is pending), elevated BUN to creatinine ratio indicating dehydration, and need for serial H/H to make sure she has not lost too much blood volume, I discussed the case with the hospitalist who will admit.   [CF]    Clinical Course User Index [CF] Loleta RoseForbach, Harlean Regula, MD    ____________________________________________  FINAL CLINICAL IMPRESSION(S) / ED DIAGNOSES  Final diagnoses:  Injury of head, initial encounter  Laceration of lower lip, initial encounter  Anemia, unspecified type  Hyponatremia  Dehydration    Leukocytosis, unspecified type     MEDICATIONS GIVEN DURING THIS VISIT:  Medications  lidocaine (PF) (XYLOCAINE) 1 % injection (has no administration in time range)  sodium chloride 0.9 % bolus 500 mL (500 mLs Intravenous Transfusing/Transfer 08/22/18 0520)  0.9 %  sodium chloride infusion (has no administration in time range)  morphine 2 MG/ML injection 2 mg (has no administration in time range)    Or  morphine 4 MG/ML injection 4 mg (has no administration in time range)  acetaminophen (TYLENOL) tablet 650 mg (650 mg Oral Given 08/22/18 0500)    Or  acetaminophen (TYLENOL) suppository 650 mg ( Rectal See Alternative 08/22/18 0500)  acetaminophen (TYLENOL) 325 MG tablet (has no administration in time range)  iohexol (OMNIPAQUE) 300 MG/ML solution 75 mL (75 mLs Intravenous Contrast Given 08/22/18 0027)     ED Discharge Orders    None       Note:  This document was prepared using Dragon voice recognition software and may include unintentional dictation errors.    Loleta Rose, MD 08/22/18 [phone]

## 2018-08-22 NOTE — Plan of Care (Signed)
  Problem: Pain Managment: Goal: General experience of comfort will improve Outcome: Progressing  Need reinforcement with pain medications due to trauma. Pt reluctant to take due to causing constipation

## 2018-08-22 NOTE — ED Notes (Signed)
RN to bedside for rounding. Patient at CT 

## 2018-08-22 NOTE — H&P (Signed)
[employer] [facility] - [place] [facility]   PATIENT NAME: Debbie Hampton    MR#:  [id number]  DATE OF BIRTH:  04/26/1932  DATE OF ADMISSION:  08/21/2018  PRIMARY CARE PHYSICIAN: Kandyce Rud, MD   REQUESTING/REFERRING PHYSICIAN: Loleta Rose, MD  CHIEF COMPLAINT:   Chief Complaint  Patient presents with  . Fall  . Laceration    HISTORY OF PRESENT ILLNESS:  Debbie Hampton  is a 84 y.o. female with a known history of HTN, HLD, CVA p/w mechanical fall, lip laceration, acute blood loss anemia. Pt is significantly hard of hearing (R ear better than L ear). She is AAOx3, but is not a particularly good historian. She resides at [facility]. She says she had a CVA last year. She tells me she was adjusting the blanket on her bed, and ended up losing balance and falling between the bed and wall. She states she hit her mouth/lower lip and L face on the floor. She states she started bleeding immediately and significantly, w/ bleed saturating multiple towels. EMS called. Laceration sutured in ED, but continues to slowly ooze at the time of my assessment. She takes ASA + Plavix. Pt endorses mild headache + neck pain at the time of my assessment, requests Tylenol. BP labile in ED. Dehydrated, hyponatremic.  PAST MEDICAL HISTORY:   Past Medical History:  Diagnosis Date  . Bilateral shoulder pain   . Colon polyp   . Constipation   . GERD (gastroesophageal reflux disease)   . Hypertension   . Impaired fasting glucose   . Syncope 03/2016   with [facility]  . Varicose veins of both lower extremities     PAST SURGICAL HISTORY:   Past Surgical History:  Procedure Laterality Date  . TONSILLECTOMY    . TUMOR REMOVAL     ovaries    SOCIAL HISTORY:   Social History   Tobacco Use  . Smoking status: Former Smoker    Types: Cigarettes  . Smokeless tobacco: Never Used  . Tobacco comment: quit 1985  Substance Use Topics  . Alcohol use: No    FAMILY HISTORY:   Family History    Problem Relation Age of Onset  . Hypertension Other   . Breast cancer Sister   . Kidney cancer Neg Hx   . Kidney disease Neg Hx   . Prostate cancer Neg Hx     DRUG ALLERGIES:   Allergies  Allergen Reactions  . Mushroom Extract Complex   . Penicillins     Has patient had a PCN reaction causing immediate rash, facial/tongue/throat swelling, SOB or lightheadedness with hypotension: Yes Has patient had a PCN reaction causing severe rash involving mucus membranes or skin necrosis: No Has patient had a PCN reaction that required hospitalization No Has patient had a PCN reaction occurring within the last 10 years: Yes If all of the above answers are "NO", then may proceed with Cephalosporin use.   . Aloe Hives and Rash  . Amlodipine Hives, Rash and Hypertension  . Cephalexin Rash  . Metoprolol Hives, Palpitations and Hypertension  . Miconazole Swelling and Rash  . Neosporin [Neomycin-Bacitracin Zn-Polymyx] Rash  . Trandolapril-Verapamil Hcl Er Rash    REVIEW OF SYSTEMS:   Review of Systems  Constitutional: Negative for chills, diaphoresis, fever, malaise/fatigue and weight loss.  HENT: Positive for hearing loss. Negative for congestion, ear pain, nosebleeds, sinus pain, sore throat and tinnitus.   Eyes: Negative for blurred vision, double vision and photophobia.  Respiratory: Negative for  cough, hemoptysis, sputum production, shortness of breath and wheezing.   Cardiovascular: Negative for chest pain, palpitations, orthopnea, claudication, leg swelling and PND.  Gastrointestinal: Negative for abdominal pain, blood in stool, constipation, diarrhea, heartburn, melena, nausea and vomiting.  Genitourinary: Positive for flank pain. Negative for dysuria, frequency, hematuria and urgency.  Musculoskeletal: Positive for falls and neck pain. Negative for back pain, joint pain and myalgias.  Skin: Negative for itching and rash.  Neurological: Positive for headaches. Negative for dizziness,  tingling, tremors, sensory change, speech change, focal weakness, seizures, loss of consciousness and weakness.  Psychiatric/Behavioral: Negative for depression and memory loss. The patient is not nervous/anxious and does not have insomnia.    MEDICATIONS AT HOME:   Prior to Admission medications   Medication Sig Start Date End Date Taking? Authorizing Provider  acetaminophen (TYLENOL) 500 MG tablet Take 500 mg by mouth every 8 (eight) hours as needed.   Yes [provider]  aspirin EC 81 MG EC tablet Take 1 tablet (81 mg total) daily by mouth. 08/01/17  Yes Mody, Sital, MD  atenolol (TENORMIN) 100 MG tablet Take 1 tablet (100 mg total) by mouth daily. 08/13/17  Yes Love, Evlyn Kanner, PA-C  Cholecalciferol (VITAMIN D) 2000 units tablet Take 2,000 Units by mouth daily.    Yes [provider]  citalopram (CELEXA) 20 MG tablet Take 20 mg by mouth daily.    Yes [provider]  clobetasol cream (TEMOVATE) 0.05 % Apply 1 application topically 2 (two) times daily as needed. To back, buttocks, and private area 11/17/17  Yes [provider]  clopidogrel (PLAVIX) 75 MG tablet Take 1 tablet (75 mg total) by mouth daily. 08/13/17  Yes Love, Evlyn Kanner, PA-C  hydrALAZINE (APRESOLINE) 50 MG tablet Take 1 tablet (50 mg total) by mouth 3 (three) times daily. 08/13/17  Yes Love, Evlyn Kanner, PA-C  lisinopril (PRINIVIL,ZESTRIL) 40 MG tablet Take 1 tablet (40 mg total) by mouth daily. 08/13/17  Yes Love, Evlyn Kanner, PA-C  Menthol, Topical Analgesic, (BIOFREEZE) 4 % GEL Apply 1 application topically 2 (two) times daily. To neck and shoulder   Yes [provider]  Multiple Vitamins-Minerals (CERTAVITE/ANTIOXIDANTS) TABS Take 1 tablet by mouth daily.   Yes [provider]  omega-3 acid ethyl esters (LOVAZA) 1 g capsule Take 1 g by mouth 2 (two) times daily.   Yes [provider]  pantoprazole (PROTONIX) 40 MG tablet Take 1 tablet (40 mg total) by mouth daily. 08/14/17   Yes Love, Evlyn Kanner, PA-C  Polyethyl Glycol-Propyl Glycol (SYSTANE ULTRA) 0.4-0.3 % SOLN Apply 1 drop to eye 3 (three) times daily.   Yes [provider]  polyethylene glycol (MIRALAX / GLYCOLAX) packet Take 17 g by mouth daily. Patient taking differently: Take 17 g by mouth 2 (two) times daily as needed.  08/14/17  Yes Love, Evlyn Kanner, PA-C  rosuvastatin (CRESTOR) 20 MG tablet Take 1 tablet (20 mg total) at bedtime by mouth. 08/01/17 08/22/18 Yes Mody, Sital, MD  senna-docusate (SENOKOT-S) 8.6-50 MG tablet Take 1 tablet by mouth daily as needed.   Yes [provider]  spironolactone (ALDACTONE) 25 MG tablet Take 12.5 mg by mouth daily.    Yes [provider]  traMADol (ULTRAM) 50 MG tablet Take 50 mg by mouth 3 (three) times daily as needed.   Yes [provider]  diclofenac sodium (VOLTAREN) 1 % GEL Apply 2 g topically 3 (three) times daily. Patient not taking: Reported on 08/22/2018 08/13/17   Love,  Evlyn Kanner, PA-C  potassium chloride (K-DUR,KLOR-CON) 10 MEQ tablet Take 1 tablet (10 mEq total) by mouth 2 (two) times daily. Patient not taking: Reported on 09/22/2017 08/13/17   Love, Evlyn Kanner, PA-C      VITAL SIGNS:  Blood pressure (!) 100/36, pulse (!) 48, temperature (!) 97.4 F (36.3 C), temperature source Oral, resp. rate 18, height 5\' 1"  (1.549 m), weight 54.4 kg, SpO2 96 %.  PHYSICAL EXAMINATION:  Physical Exam  Constitutional: She is oriented to person, place, and time. She appears well-developed and well-nourished. She is active and cooperative.  Non-toxic appearance. She does not have a sickly appearance. She does not appear ill. No distress. She is not intubated.  HENT:  Head: Head is with laceration.  Mouth/Throat: Oropharynx is clear and moist. No oropharyngeal exudate.  (+) L facial swelling/bruising/pain/TTP. (+) lower lip laceration w/ slow oozing, swelling.  Eyes: Conjunctivae and EOM are normal. No scleral icterus.  Neck: Neck supple. No JVD  present. No thyromegaly present.  Cardiovascular: Regular rhythm, S1 normal, S2 normal and normal heart sounds.  No extrasystoles are present. Bradycardia present. Exam reveals no gallop, no S3, no S4, no distant heart sounds and no friction rub.  No murmur heard. Pulmonary/Chest: Effort normal and breath sounds normal. No accessory muscle usage or stridor. No apnea, no tachypnea and no bradypnea. She is not intubated. No respiratory distress. She has no decreased breath sounds. She has no wheezes. She has no rhonchi. She has no rales.  Abdominal: Soft. She exhibits no distension. Bowel sounds are decreased. There is no tenderness. There is no rigidity, no rebound and no guarding.  Musculoskeletal: Normal range of motion. She exhibits no edema or tenderness.  Lymphadenopathy:    She has no cervical adenopathy.  Neurological: She is alert and oriented to person, place, and time. She is not disoriented.  Skin: Skin is warm and dry. No rash noted. She is not diaphoretic. No erythema.  Psychiatric: She has a normal mood and affect. Her behavior is normal. Judgment and thought content normal. Her speech is tangential. Cognition and memory are normal.   LABORATORY PANEL:   CBC Recent Labs  Lab 08/22/18 0021 08/22/18 0646  WBC 12.2*  --   HGB 9.8* 8.1*  HCT 31.4*  --   PLT 268  --    ------------------------------------------------------------------------------------------------------------------  Chemistries  Recent Labs  Lab 08/22/18 0021 08/22/18 0646  NA 130*  --   K 3.8  --   CL 96*  --   CO2 26  --   GLUCOSE 127*  --   BUN 20  --   CREATININE 0.83  --   CALCIUM 9.6  --   MG  --  1.9  AST  --  37  ALT  --  31  ALKPHOS  --  69  BILITOT  --  0.5   ------------------------------------------------------------------------------------------------------------------  Cardiac Enzymes No results for input(s): TROPONINI in the last 168  hours. ------------------------------------------------------------------------------------------------------------------  RADIOLOGY:  Ct Head Wo Contrast  Result Date: 08/22/2018 CLINICAL DATA:  Pt [facility] ER [facility] after fall, pt has laceration to lower lip active bleeding pt takes blood thinners, pt reports was fixing blankets on her bed and fell swelling to left side of face noted and bruising, p.*comment was truncated*^80mL OMNIPAQUE IOHEXOL 300 MG/ML SOLNHead trauma, intracranial venous injury suspected; Facial trauma, fx suspected; C-spine trauma, high clinical risk (NEXUS/CCR) EXAM: CT HEAD WITHOUT CONTRAST CT MAXILLOFACIAL WITHOUT CONTRAST CT CERVICAL SPINE WITHOUT  CONTRAST TECHNIQUE: Multidetector CT imaging of the head, cervical spine, and maxillofacial structures were performed using the standard protocol without intravenous contrast. Multiplanar CT image reconstructions of the cervical spine and maxillofacial structures were also generated. COMPARISON:  Head CT 09/21/2017 the FINDINGS: CT HEAD FINDINGS Brain: No intracranial hemorrhage. No parenchymal contusion. No midline shift or mass effect. Basilar cisterns are patent. No skull base fracture. No fluid in the paranasal sinuses or mastoid air cells. Orbits are normal. There are periventricular and subcortical white matter hypodensities. Generalized cortical atrophy. Deep white matter infarction in the RIGHT centrum semiovale. Vascular: No hyperdense vessel or unexpected calcification. Skull: Normal. Negative for fracture or focal lesion. Sinuses/Orbits: Mucosal thickening in the LEFT maxillary sinus. Orbits normal Other: None. CT MAXILLOFACIAL FINDINGS Osseous: No orbital fracture. Maxillary sinus walls are intact. There is thickening of the LEFT maxillary sinus wall related to chronic mucosal inflammation. Pterygoid plates are normal. Mandibular condyles located. No mandible fracture. Orbits: No proptosis. Some mild  preseptal swelling on the LEFT. Intraconal contents are normal. Sinuses: Mucosal thickening in the LEFT maxillary sinus Soft tissues: small hematoma in theLEFT cheek (image 42/2). Hematoma in the soft tissue overlying this central mandible CT CERVICAL SPINE FINDINGS Alignment: Normal alignment of the cervical vertebral bodies. Skull base and vertebrae: Normal craniocervical junction. No loss of vertebral body height or disc height. Normal facet articulation. No evidence of fracture. Soft tissues and spinal canal: No prevertebral soft tissue swelling. No perispinal or epidural hematoma. Disc levels: Disc space narrowing at C4-C5 C6-C7. Associated endplate osteophytosis. Upper chest: Clear Other: None IMPRESSION: 1. No intracranial trauma. 2. Soft tissue hematoma over the LEFT cheek. Mild preseptal swelling over the LEFT orbit. Hematoma midline anterior to the mandible symphysis. 3. No facial bone fracture. 4. Chronic sinus inflammation of the LEFT maxillary sinus. 5. No cervical spine fracture Electronically Signed   By: Genevive Bi M.D.   On: 08/22/2018 01:57   Ct Soft Tissue Neck W Contrast  Result Date: 08/22/2018 CLINICAL DATA:  Initial evaluation for acute trauma, fall. Contusion and laceration to lower lip. EXAM: CT NECK WITH CONTRAST TECHNIQUE: Multidetector CT imaging of the neck was performed using the standard protocol following the bolus administration of intravenous contrast. CONTRAST:  75mL OMNIPAQUE IOHEXOL 300 MG/ML  SOLN COMPARISON:  Concomitant CT of the face. FINDINGS: Pharynx and larynx: Oral cavity within normal limits. No base of tongue lesion. Oropharynx and nasopharynx within normal limits. No retropharyngeal collection. Epiglottis normal. Vallecula clear. Remainder of the hypopharynx and supraglottic larynx within normal limits. No acute abnormality about the glottis. Subglottic airway bowed to the right but widely patent and clear. Salivary glands: Salivary glands including the  parotid and submandibular glands within normal limits. Thyroid: Thyroid heterogeneous with few scattered subcentimeter nodules present. Left lobe of thyroid asymmetrically enlarged as compared to the right. Lymph nodes: No adenopathy within the neck. Vascular: Extensive atherosclerotic change about the aortic arch without high-grade stenosis. Prominent carotid bifurcation atherosclerosis with associated narrowing of up to approximately 40% bilaterally. Great vessels remain patent within the neck. No appreciable active arterial contrast extravasation about the left facial contusion, although evaluation somewhat limited by streak artifact from dental amalgam. Limited intracranial: Unremarkable. Visualized orbits: Visualized globes and orbital soft tissues demonstrate no acute finding. Mastoids and visualized paranasal sinuses: Chronic left maxillary sinusitis noted. Visualized paranasal sinuses are otherwise largely clear. Visualized mastoids and middle ear cavities are well pneumatized and free of fluid. Skeleton: No acute osseous abnormality identified. No discrete lytic or  blastic osseous lesions. Moderate cervical spondylolysis noted throughout the cervical spine. Prominent periapical lucency at the right maxillary lateral incisor. Upper chest: Scattered adenopathy within the visualized mediastinum, largest of which measures up to 2.1 cm in short axis at the precarinal level. Scattered atelectatic changes noted within the visualized lungs. Underlying interlobular septal thickening suggest mild pulmonary interstitial congestion/edema. Few small foci of tree-in-bud nodular densities within the peripheral right upper lobe suspicious for mild endobronchial infection (series 2, image 82). Other: Soft tissue contusion seen involving the left face with extensive soft tissue swelling about the lower lip. Superimposed laceration. Small multifocal hematoma within the subcutaneous fat of the left pre maxillary soft tissues  measure up to 11 mm (series 2, image 22). No other discrete hematoma or drainable fluid collection. Again, no appreciable active contrast extravasation. IMPRESSION: 1. Soft tissue contusion with laceration involving the left face and lower lip. No appreciable active contrast extravasation identified. 2. Small area of scattered tree-in-bud nodular densities within the peripheral right upper lobe, suspicious for mild endobronchial infection. 3. Enlarged mediastinal adenopathy, nonspecific, but could be reactive. 4. Moderate to advanced atherosclerotic change about the aortic arch and carotid bifurcations bilaterally. Associated narrowing of up to approximately 40% at the proximal right ICAs bilaterally. 5. Chronic left maxillary sinusitis. Electronically Signed   By: Rise Mu M.D.   On: 08/22/2018 01:33   Ct Chest Wo Contrast  Result Date: 08/22/2018 CLINICAL DATA:  84 year old female with acute respiratory distress. EXAM: CT CHEST WITHOUT CONTRAST TECHNIQUE: Multidetector CT imaging of the chest was performed following the standard protocol without IV contrast. COMPARISON:  Chest radiograph dated 08/22/2018 FINDINGS: Evaluation of this exam is limited in the absence of intravenous contrast. Cardiovascular: Mild cardiomegaly. Multi vessel coronary vascular calcification. No pericardial effusion. There is advanced atherosclerotic calcification of the thoracic aorta. The central pulmonary arteries are grossly unremarkable. Mediastinum/Nodes: No hilar or mediastinal adenopathy. There is a moderate size hiatal hernia. Bilateral thyroid nodules measure 18 mm on the left. Ultrasound may provide better evaluation. Lungs/Pleura: Pleural thickening versus trace right pleural effusion. There are bibasilar linear atelectasis/scarring. There is a cluster of nodular density in the right upper lobe, likely post inflammatory. Clinical correlation and follow-up recommended. There are biapical pleural thickening, right  greater than left. No lobar consolidation or pneumothorax. The central airways are patent. Upper Abdomen: No acute abnormality. Musculoskeletal: Osteopenia. No acute osseous pathology. IMPRESSION: 1. A cluster of nodular density in the right upper lobe, likely post inflammatory. Clinical correlation and follow-up recommended. 2. Moderate size hiatal hernia. 3. Bilateral thyroid nodules. Ultrasound may provide better evaluation. Electronically Signed   By: Elgie Collard M.D.   On: 08/22/2018 04:44   Ct Cervical Spine Wo Contrast  Result Date: 08/22/2018 CLINICAL DATA:  Pt presents to ER via EMS from [facility] after fall, pt has laceration to lower lip active bleeding pt takes blood thinners, pt reports was fixing blankets on her bed and fell swelling to left side of face noted and bruising, p.*comment was truncated*^40mL OMNIPAQUE IOHEXOL 300 MG/ML SOLNHead trauma, intracranial venous injury suspected; Facial trauma, fx suspected; C-spine trauma, high clinical risk (NEXUS/CCR) EXAM: CT HEAD WITHOUT CONTRAST CT MAXILLOFACIAL WITHOUT CONTRAST CT CERVICAL SPINE WITHOUT CONTRAST TECHNIQUE: Multidetector CT imaging of the head, cervical spine, and maxillofacial structures were performed using the standard protocol without intravenous contrast. Multiplanar CT image reconstructions of the cervical spine and maxillofacial structures were also generated. COMPARISON:  Head CT 09/21/2017 the FINDINGS: CT HEAD FINDINGS Brain: No intracranial  hemorrhage. No parenchymal contusion. No midline shift or mass effect. Basilar cisterns are patent. No skull base fracture. No fluid in the paranasal sinuses or mastoid air cells. Orbits are normal. There are periventricular and subcortical white matter hypodensities. Generalized cortical atrophy. Deep white matter infarction in the RIGHT centrum semiovale. Vascular: No hyperdense vessel or unexpected calcification. Skull: Normal. Negative for fracture or focal lesion.  Sinuses/Orbits: Mucosal thickening in the LEFT maxillary sinus. Orbits normal Other: None. CT MAXILLOFACIAL FINDINGS Osseous: No orbital fracture. Maxillary sinus walls are intact. There is thickening of the LEFT maxillary sinus wall related to chronic mucosal inflammation. Pterygoid plates are normal. Mandibular condyles located. No mandible fracture. Orbits: No proptosis. Some mild preseptal swelling on the LEFT. Intraconal contents are normal. Sinuses: Mucosal thickening in the LEFT maxillary sinus Soft tissues: small hematoma in theLEFT cheek (image 42/2). Hematoma in the soft tissue overlying this central mandible CT CERVICAL SPINE FINDINGS Alignment: Normal alignment of the cervical vertebral bodies. Skull base and vertebrae: Normal craniocervical junction. No loss of vertebral body height or disc height. Normal facet articulation. No evidence of fracture. Soft tissues and spinal canal: No prevertebral soft tissue swelling. No perispinal or epidural hematoma. Disc levels: Disc space narrowing at C4-C5 C6-C7. Associated endplate osteophytosis. Upper chest: Clear Other: None IMPRESSION: 1. No intracranial trauma. 2. Soft tissue hematoma over the LEFT cheek. Mild preseptal swelling over the LEFT orbit. Hematoma midline anterior to the mandible symphysis. 3. No facial bone fracture. 4. Chronic sinus inflammation of the LEFT maxillary sinus. 5. No cervical spine fracture Electronically Signed   By: Genevive BiStewart  Edmunds M.D.   On: 08/22/2018 01:57   Dg Chest Portable 1 View  Result Date: 08/22/2018 CLINICAL DATA:  84 year old female with fall. EXAM: PORTABLE CHEST 1 VIEW COMPARISON:  Chest radiograph dated 03/31/2016 FINDINGS: The lungs are clear. There is no pleural effusion or pneumothorax. Mild cardiomegaly. Atherosclerotic calcification of the aortic arch. Minimal angulation of the distal left clavicle appears old. No acute osseous pathology. IMPRESSION: No active disease. Electronically Signed   By: Elgie CollardArash   Radparvar M.D.   On: 08/22/2018 04:01   Ct Maxillofacial Wo Contrast  Result Date: 08/22/2018 CLINICAL DATA:  [facility] pt has laceration to lower lip active bleeding pt takes blood thinners, pt reports was fixing blankets on her bed and fell swelling to left side of face noted and bruising, p.*comment was truncated*^10375mL OMNIPAQUE IOHEXOL 300 MG/ML SOLNHead trauma, intracranial venous injury suspected; Facial trauma, fx suspected; C-spine trauma, high clinical risk (NEXUS/CCR) EXAM: CT HEAD WITHOUT CONTRAST CT MAXILLOFACIAL WITHOUT CONTRAST CT CERVICAL SPINE WITHOUT CONTRAST TECHNIQUE: Multidetector CT imaging of the head, cervical spine, and maxillofacial structures were performed using the standard protocol without intravenous contrast. Multiplanar CT image reconstructions of the cervical spine and maxillofacial structures were also generated. COMPARISON:  Head CT 09/21/2017 the FINDINGS: CT HEAD FINDINGS Brain: No intracranial hemorrhage. No parenchymal contusion. No midline shift or mass effect. Basilar cisterns are patent. No skull base fracture. No fluid in the paranasal sinuses or mastoid air cells. Orbits are normal. There are periventricular and subcortical white matter hypodensities. Generalized cortical atrophy. Deep white matter infarction in the RIGHT centrum semiovale. Vascular: No hyperdense vessel or unexpected calcification. Skull: Normal. Negative for fracture or focal lesion. Sinuses/Orbits: Mucosal thickening in the LEFT maxillary sinus. Orbits normal Other: None. CT MAXILLOFACIAL FINDINGS Osseous: No orbital fracture. Maxillary sinus walls are intact. There is thickening of the LEFT maxillary sinus  wall related to chronic mucosal inflammation. Pterygoid plates are normal. Mandibular condyles located. No mandible fracture. Orbits: No proptosis. Some mild preseptal swelling on the LEFT. Intraconal contents are normal. Sinuses: Mucosal thickening in  the LEFT maxillary sinus Soft tissues: small hematoma in theLEFT cheek (image 42/2). Hematoma in the soft tissue overlying this central mandible CT CERVICAL SPINE FINDINGS Alignment: Normal alignment of the cervical vertebral bodies. Skull base and vertebrae: Normal craniocervical junction. No loss of vertebral body height or disc height. Normal facet articulation. No evidence of fracture. Soft tissues and spinal canal: No prevertebral soft tissue swelling. No perispinal or epidural hematoma. Disc levels: Disc space narrowing at C4-C5 C6-C7. Associated endplate osteophytosis. Upper chest: Clear Other: None IMPRESSION: 1. No intracranial trauma. 2. Soft tissue hematoma over the LEFT cheek. Mild preseptal swelling over the LEFT orbit. Hematoma midline anterior to the mandible symphysis. 3. No facial bone fracture. 4. Chronic sinus inflammation of the LEFT maxillary sinus. 5. No cervical spine fracture Electronically Signed   By: Genevive Bi M.D.   On: 08/22/2018 01:57   IMPRESSION AND PLAN:   A/P: 89F p/w mechanical fall, lip laceration, acute blood loss anemia. Hypovolemic hypochloremic hyponatremia, dehydration/intravascular volume depletion, hyperglycemia, BNP elevation, leukocytosis, normocytic anemia. HTN, labile BP. -Lip laceration, acute blood loss anemia, normocytic anemia: Pt p/w 1d Hx fall, lip laceration. Reported significant (possibly arterial) bleeding, saturated multiple towels at facility. Laceration repaired in ED, but continues to slowly ooze at the time of my assessment. Hgb previously 11.8 (09/21/2017), decreased to 9.8 on ED arrival (w/ subsequent further decreased to 8.1, s/p IVF). ENT consult, laceration may require additional sutures for completion of repair and hemostasis. Monitor Hgb, pRBC txfn if < 7.0. Trauma series imaging otherwise (-) for significant injury. Pain ctrl. Fall precautions. P/T eval. -Hypovolemic hypochloremic hyponatremia, dehydration/intravascular volume  depletion: Na+ 130, Cl- 96. Endorses dehydration. IVF. EF 60-65% as of 07/30/2017. Monitor BMP. -BNP elevation: Significance unclear. Last EF 60-65%. Not clinically in acute failure. Dehydrated/intravascularly volume depleted. IVF as above. -Leukocytosis, hyperglycemia: WBC 12.2, glucose 127. Likely reactive, 2/2 fall/trauma, possibly worsened 2/2 hemoconcentration. No obvious active infxn. Afebrile. CT chest notes RUL nodular density, but does not describe other infiltrate/consolidation. U/A pending. PCT low. Not on ABx at present. -Normocytic anemia: Acute blood loss anemia superimposed on anemia of chronic disease. As above. -HTN, labile BP: c/w home antihypertensive regimen as tolerated. -c/w home meds/formulary subs as tolerated. -FEN/GI: Cardiac diet as tolerated. -DVT PPx: SCDs, no pharmacological DVT PPx (active bleed + anemia). -Code status: DNR/DNI. -Disposition: Admission, > 2 midnights.   All the records are reviewed and case discussed with ED provider. Management plans discussed with the patient, family and they are in agreement.  CODE STATUS: DNR/DNI.  TOTAL TIME TAKING CARE OF THIS PATIENT: 75 minutes.    Barbaraann Rondo M.D on 08/22/2018 at 8:29 AM  Between 7am to 6pm - Pager - [phone]  After 6pm go to [email or web address] - Social research officer, government  [facility]  Office  [phone]  CC: Primary care physician; Kandyce Rud, MD   Note: This dictation was prepared with Dragon dictation along with smaller phrase technology. Any transcriptional errors that result from this process are unintentional.

## 2018-08-22 NOTE — ED Notes (Signed)
Pt is sleeping no distress noted, family at bedside.

## 2018-08-22 NOTE — ED Notes (Signed)
Patient transported to CT 

## 2018-08-22 NOTE — ED Notes (Signed)
Pam CT Tech at bedside

## 2018-08-22 NOTE — ED Notes (Signed)
Family at bedside. 

## 2018-08-22 NOTE — Progress Notes (Signed)
Physical Therapy Evaluation Patient Details Name: Debbie BalesCarol Hampton MRN: [id number] DOB: October 01, 1931 Today's Date: 08/22/2018   History of Present Illness  per MD note:Debbie Hampton  is a 84 y.o. female with a known history of HTN, HLD, CVA p/w mechanical fall, lip laceration, acute blood loss anemia. Pt is significantly hard of hearing (R ear better than L ear). She is AAOx3, but is not a particularly good historian. [facility]. She says she had a CVA last year. She tells me she was adjusting the blanket on her bed, and ended up losing balance and falling.  Clinical Impression  Patient performs bed mobility, transfers and gait with min assist and RW. She ambulates 150 feet with min assist and RW. She has weakness in BLE and BUe and balance deficit in static and dynamic standing balance. She will continue to benefit from skilled PT and then be discharged to assisted living and continue to receive skilled PT .   Follow Up Recommendations Home health PT    Equipment Recommendations  Rolling walker with 5" wheels    Recommendations for Other Services       Precautions / Restrictions Restrictions Weight Bearing Restrictions: No      Mobility  Bed Mobility Overal bed mobility: Needs Assistance Bed Mobility: Supine to Sit;Sit to Supine     Supine to sit: (min) Sit to supine: (min)      Transfers Overall transfer level: Needs assistance Equipment used: (RW)             General transfer comment: (min assist)  Ambulation/Gait Ambulation/Gait assistance: Min guard Gait Distance (Feet): 150 Feet Assistive device: (RW)   Gait velocity: (.5 m/sec)      Stairs            Wheelchair Mobility    Modified Rankin (Stroke Patients Only)       Balance Overall balance assessment: Needs assistance Sitting-balance support: Single extremity supported Sitting balance-Leahy Scale: Fair     Standing balance support: Bilateral upper extremity  supported Standing balance-Leahy Scale: Fair                               Pertinent Vitals/Pain Pain Assessment: No/denies pain    Home Living Family/patient expects to be discharged to:: Assisted living               Home Equipment: Walker - 2 wheels      Prior Function Level of Independence: Independent with assistive device(s);Needs assistance   Gait / Transfers Assistance Needed: (SBA with RW)           Hand Dominance        Extremity/Trunk Assessment   Upper Extremity Assessment Upper Extremity Assessment: Generalized weakness    Lower Extremity Assessment Lower Extremity Assessment: Generalized weakness       Communication   Communication: No difficulties  Cognition Arousal/Alertness: Awake/alert Behavior During Therapy: WFL for tasks assessed/performed Overall Cognitive Status: Within Functional Limits for tasks assessed                                        General Comments      Exercises     Assessment/Plan    PT Assessment Patient needs continued PT services  PT Problem List Decreased strength;Decreased activity tolerance;Decreased balance;Decreased mobility       PT  Treatment Interventions Gait training;Functional mobility training;Therapeutic activities;Therapeutic exercise;Balance training    PT Goals (Current goals can be found in the Care Plan section)  Acute Rehab PT Goals Patient Stated Goal: (to get stronger) PT Goal Formulation: With patient Time For Goal Achievement: 09/05/18 Potential to Achieve Goals: Good    Frequency Min 2X/week   Barriers to discharge        Co-evaluation               AM-PAC PT "6 Clicks" Mobility  Outcome Measure Help needed turning from your back to your side while in a flat bed without using bedrails?: A Little Help needed moving from lying on your back to sitting on the side of a flat bed without using bedrails?: A Little Help needed moving to and  from a bed to a chair (including a wheelchair)?: A Little Help needed standing up from a chair using your arms (e.g., wheelchair or bedside chair)?: A Little Help needed to walk in hospital room?: A Little Help needed climbing 3-5 steps with a railing? : A Little 6 Click Score: 18    End of Session Equipment Utilized During Treatment: Gait belt;Other (comment)   Patient left: with call bell/phone within reach;with bed alarm set Nurse Communication: Mobility status PT Visit Diagnosis: Unsteadiness on feet (R26.81);Other abnormalities of gait and mobility (R26.89);Repeated falls (R29.6);Muscle weakness (generalized) (M62.81);History of falling (Z91.81);Difficulty in walking, not elsewhere classified (R26.2)    Time: 1610-9604 PT Time Calculation (min) (ACUTE ONLY): 25 min   Charges:   PT Evaluation $PT Eval Low Complexity: 1 Low PT Treatments $Gait Training: 8-22 mins        Ezekiel Ina , PT DPT 08/22/2018, 5:18 PM

## 2018-08-22 NOTE — Clinical Social Work Note (Signed)
The CSW received a consult that the patient was admitted from an ALF. The CSW will assess when able.   Debbie PonderKaren Martha Akili Cuda, MSW, Theresia MajorsLCSWA [phone]

## 2018-08-23 DIAGNOSIS — D62 Acute posthemorrhagic anemia: Secondary | ICD-10-CM | POA: Diagnosis not present

## 2018-08-23 DIAGNOSIS — E860 Dehydration: Secondary | ICD-10-CM | POA: Diagnosis not present

## 2018-08-23 LAB — CBC
HCT: 24.4 % — ABNORMAL LOW (ref 36.0–46.0)
Hemoglobin: 7.5 g/dL — ABNORMAL LOW (ref 12.0–15.0)
MCH: 25 pg — ABNORMAL LOW (ref 26.0–34.0)
MCHC: 30.7 g/dL (ref 30.0–36.0)
MCV: 81.3 fL (ref 80.0–100.0)
Platelets: 222 10*3/uL (ref 150–400)
RBC: 3 MIL/uL — ABNORMAL LOW (ref 3.87–5.11)
RDW: 14.1 % (ref 11.5–15.5)
WBC: 5.5 10*3/uL (ref 4.0–10.5)
nRBC: 0 % (ref 0.0–0.2)

## 2018-08-23 LAB — BASIC METABOLIC PANEL
Anion gap: 7 (ref 5–15)
BUN: 19 mg/dL (ref 8–23)
CO2: 24 mmol/L (ref 22–32)
Calcium: 8.9 mg/dL (ref 8.9–10.3)
Chloride: 100 mmol/L (ref 98–111)
Creatinine, Ser: 0.86 mg/dL (ref 0.44–1.00)
GFR calc Af Amer: >60 mL/min (ref >60)
GFR calc non Af Amer: >60 mL/min (ref >60)
Glucose, Bld: 88 mg/dL (ref 70–99)
Potassium: 4.2 mmol/L (ref 3.5–5.1)
Sodium: 131 mmol/L — ABNORMAL LOW (ref 135–145)

## 2018-08-23 MED ORDER — SPIRONOLACTONE 25 MG PO TABS: 25.0000 mg | ORAL_TABLET | Freq: Every day | ORAL | 0 refills | Status: DC

## 2018-08-23 MED ORDER — ATENOLOL 50 MG PO TABS: 50.0000 mg | ORAL_TABLET | Freq: Every day | ORAL | 0 refills | Status: DC

## 2018-08-23 MED ORDER — FERROUS SULFATE 325 (65 FE) MG PO TABS: 325.0000 mg | ORAL_TABLET | Freq: Every day | ORAL | 3 refills | Status: DC

## 2018-08-23 MED ORDER — MUPIROCIN 2 % EX OINT: TOPICAL_OINTMENT | Freq: Four times a day (QID) | CUTANEOUS | 0 refills | Status: DC

## 2018-08-23 NOTE — Clinical Social Work Note (Signed)
Clinical Social Work Assessment  Patient Details  Name: Debbie Hampton MRN: [id number] Date of Birth: May 13, 1932  Date of referral:  08/23/18               Reason for consult:  Facility Placement                Permission sought to share information with:  Oceanographeracility Contact Representative Permission granted to share information::  Yes, Verbal Permission Granted  Name::        Agency::  [facility]  Relationship::     Contact Information:     Housing/Transportation Living arrangements for the past 2 months:  [facility] Source of Information:  Patient, Medical Team, Power of [place], Adult Children, Facility Patient Interpreter Needed:  None Criminal Activity/Legal Involvement Pertinent to Current Situation/Hospitalization:  No - Comment as needed Significant Relationships:  Adult Children, Merchandiser, retailCommunity Support, [employer] Lives with:  Facility Resident Do you feel safe going back to the place where you live?  Yes Need for family participation in patient care:  No (Coment)  Care giving concerns:  [facility]   Social Worker assessment / plan:  The CSW contacted [facility] to advise of imminent discharge. The facility is able to received the patient today. The patient's family will transport the patient when she is ready for discharge, and the CSW will send all documentation to the facility once it is available. After the discharge packet is delivered, the CSW will sign off. The RNCM is aware of PT recommendation for HHPT and will address those needs with the patient and her family.  Employment status:    Insurance informationAdvertising account executive:  Managed Medicare PT Recommendations:  Home with Home Health Information / Referral to community resources:     Patient/Family's Response to care: The patient and her family thanked the team for care.  Patient/Family's Understanding of and Emotional Response to Diagnosis, Current Treatment, and Prognosis:  The patient is  eager to return home.  Emotional Assessment Appearance:  Appears stated age Attitude/Demeanor/Rapport:  Gracious Affect (typically observed):  Pleasant Orientation:  Oriented to Self, Oriented to Place, Oriented to  Time, Oriented to Situation Alcohol / Substance use:  Never Used Psych involvement (Current and /or in the community):  No (Comment)  Discharge Needs  Concerns to be addressed:  Care Coordination, Discharge Planning Concerns Readmission within the last 30 days:  No Current discharge risk:  Chronically ill Barriers to Discharge:  No Barriers Identified   Judi CongKaren M Dola Lunsford, LCSW 08/23/2018, 10:53 AM

## 2018-08-23 NOTE — Progress Notes (Signed)
[facility] De[facility] DimmerWesley Hampton contacted and informed of patient's discharge. Per family, [facility]

## 2018-08-23 NOTE — Care Management Note (Signed)
Case Management Note  Patient Details  Name: Debbie Hampton MRN: [id number] Date of Birth: March 06, 1932  Subjective/Objective:    Patient to be discharged per MD order. Orders in place for home health services. [facility] and wishes to return there with home health. CMS Medicare.gov Compare Post Acute Care list reviewed with patient. [facility] and prefers to use them again. Notified brittany of referral. Orders in place for DME rolling walker but patient tells me she already has one. No DME needs. Family to transport .                  Action/Plan:   Expected Discharge Date:  08/23/18               Expected Discharge Plan:  Home w Home Health Services  In-House Referral:     Discharge planning Services  CM Consult  Post Acute Care Choice:  Home Health Choice offered to:  Patient  DME Arranged:    DME Agency:     HH Arranged:  RN, PT, Nurse's Aide HH Agency:  [facility]  Status of Service:  Completed, signed off  If discussed at Long Length of Stay Meetings, dates discussed:    Additional Comments:  Virgel ManifoldJosh A Zivah Mayr, RN 08/23/2018, 1:54 PM

## 2018-08-23 NOTE — Discharge Summary (Signed)
[employer] [facility] - [place] at [facility]   PATIENT NAME: Debbie Hampton    MR#:  [id number]  DATE OF BIRTH:  05-04-1932  DATE OF ADMISSION:  08/21/2018 ADMITTING PHYSICIAN: Barbaraann Rondo, MD  DATE OF DISCHARGE: 08/23/2018  PRIMARY CARE PHYSICIAN: Kandyce Rud, MD    ADMISSION DIAGNOSIS:  Dehydration [E86.0] Hyponatremia [E87.1] Injury of head, initial encounter [S09.90XA] Laceration of lower lip, initial encounter [S01.511A] Anemia, unspecified type [D64.9] Leukocytosis, unspecified type [D72.829]  DISCHARGE DIAGNOSIS:  Active Problems:   Acute blood loss anemia   SECONDARY DIAGNOSIS:   Past Medical History:  Diagnosis Date  . Bilateral shoulder pain   . Colon polyp   . Constipation   . GERD (gastroesophageal reflux disease)   . Hypertension   . Impaired fasting glucose   . Syncope 03/2016   with [facility]  . Varicose veins of both lower extremities     HOSPITAL COURSE:  84 year old female with history of hypertension who had a mechanical fall and hit her lip and had significant bleeding from her lower lip and bruising around her face.   1.  Acute on chronic anemia: This is due to lip laceration and facial hematoma.  Her hemoglobin has remained stable.  She did not require blood transfusion.  She was evaluated by ENT while in the hospital.  Her lip was sutured.  ENT recommends adding glycerin swab and antibiotic ointment to lower lip.  Patient will follow-up with ENT in 1 week.  She will be discharged with oral ferrous sulfate.  2.  Essential hypertension: Patient will resume her outpatient medications including hydralazine, lisinopril, Aldactone atenolol.  Due to low/normal heart rate I decreased the dose of atenolol.  Her blood pressure needs to be closely monitored by her PCP.  3.  History of CVA: Patient may resume aspirin, Plavix and statin. Hemoglobin needs to be rechecked in 2 days by her primary care physician.  DISCHARGE CONDITIONS AND DIET:    stable Heart healthy diet  CONSULTS OBTAINED:  Treatment Team:  Barbaraann Rondo, MD Bud Face, MD  DRUG ALLERGIES:   Allergies  Allergen Reactions  . Mushroom Extract Complex   . Penicillins     Has patient had a PCN reaction causing immediate rash, facial/tongue/throat swelling, SOB or lightheadedness with hypotension: Yes Has patient had a PCN reaction causing severe rash involving mucus membranes or skin necrosis: No Has patient had a PCN reaction that required hospitalization No Has patient had a PCN reaction occurring within the last 10 years: Yes If all of the above answers are "NO", then may proceed with Cephalosporin use.   . Aloe Hives and Rash  . Amlodipine Hives, Rash and Hypertension  . Cephalexin Rash  . Metoprolol Hives, Palpitations and Hypertension  . Miconazole Swelling and Rash  . Neosporin [Neomycin-Bacitracin Zn-Polymyx] Rash  . Trandolapril-Verapamil Hcl Er Rash    DISCHARGE MEDICATIONS:   Allergies as of 08/23/2018      Reactions   Mushroom Extract Complex    Penicillins    Has patient had a PCN reaction causing immediate rash, facial/tongue/throat swelling, SOB or lightheadedness with hypotension: Yes Has patient had a PCN reaction causing severe rash involving mucus membranes or skin necrosis: No Has patient had a PCN reaction that required hospitalization No Has patient had a PCN reaction occurring within the last 10 years: Yes If all of the above answers are "NO", then may proceed with Cephalosporin use.   Aloe Hives, Rash   Amlodipine Hives, Rash, Hypertension  Cephalexin Rash   Metoprolol Hives, Palpitations, Hypertension   Miconazole Swelling, Rash   Neosporin [neomycin-bacitracin Zn-polymyx] Rash   Trandolapril-verapamil Hcl Er Rash      Medication List    TAKE these medications   acetaminophen 500 MG tablet Commonly known as:  TYLENOL Take 500 mg by mouth every 8 (eight) hours as needed.   aspirin 81 MG EC  tablet Take 1 tablet (81 mg total) daily by mouth.   atenolol 50 MG tablet Commonly known as:  TENORMIN Take 1 tablet (50 mg total) by mouth daily. What changed:    medication strength  how much to take   BIOFREEZE 4 % Gel Generic drug:  Menthol (Topical Analgesic) Apply 1 application topically 2 (two) times daily. To neck and shoulder   CERTAVITE/ANTIOXIDANTS Tabs Take 1 tablet by mouth daily.   citalopram 20 MG tablet Commonly known as:  CELEXA Take 20 mg by mouth daily.   clobetasol cream 0.05 % Commonly known as:  TEMOVATE Apply 1 application topically 2 (two) times daily as needed. To back, buttocks, and private area   clopidogrel 75 MG tablet Commonly known as:  PLAVIX Take 1 tablet (75 mg total) by mouth daily.   diclofenac sodium 1 % Gel Commonly known as:  VOLTAREN Apply 2 g topically 3 (three) times daily.   ferrous sulfate 325 (65 FE) MG tablet Take 1 tablet (325 mg total) by mouth daily with breakfast.   hydrALAZINE 50 MG tablet Commonly known as:  APRESOLINE Take 1 tablet (50 mg total) by mouth 3 (three) times daily.   lisinopril 40 MG tablet Commonly known as:  PRINIVIL,ZESTRIL Take 1 tablet (40 mg total) by mouth daily.   mupirocin ointment 2 % Commonly known as:  BACTROBAN Apply topically 4 (four) times daily.   omega-3 acid ethyl esters 1 g capsule Commonly known as:  LOVAZA Take 1 g by mouth 2 (two) times daily.   pantoprazole 40 MG tablet Commonly known as:  PROTONIX Take 1 tablet (40 mg total) by mouth daily.   polyethylene glycol packet Commonly known as:  MIRALAX / GLYCOLAX Take 17 g by mouth daily. What changed:    when to take this  reasons to take this   potassium chloride 10 MEQ tablet Commonly known as:  K-DUR,KLOR-CON Take 1 tablet (10 mEq total) by mouth 2 (two) times daily.   rosuvastatin 20 MG tablet Commonly known as:  CRESTOR Take 1 tablet (20 mg total) at bedtime by mouth.   senna-docusate 8.6-50 MG  tablet Commonly known as:  Senokot-S Take 1 tablet by mouth daily as needed.   spironolactone 25 MG tablet Commonly known as:  ALDACTONE Take 1 tablet (25 mg total) by mouth daily. Start taking on:  08/24/2018 What changed:  how much to take   SYSTANE ULTRA 0.4-0.3 % Soln Generic drug:  Polyethyl Glycol-Propyl Glycol Apply 1 drop to eye 3 (three) times daily.   traMADol 50 MG tablet Commonly known as:  ULTRAM Take 50 mg by mouth 3 (three) times daily as needed.   Vitamin D 50 MCG (2000 UT) tablet Take 2,000 Units by mouth daily.            Durable Medical Equipment  (From admission, onward)         Start     Ordered   08/23/18 0813  For home use only DME Walker rolling  Once    Question:  Patient needs a walker to treat with the following condition  Answer:  Weakness   08/23/18 0812            Today   CHIEF COMPLAINT:  Doing well this am    VITAL SIGNS:  Blood pressure (!) 176/54, pulse (!) 58, temperature 98.2 F (36.8 C), temperature source Oral, resp. rate 19, height 5\' 1"  (1.549 m), weight 54.4 kg, SpO2 96 %.   REVIEW OF SYSTEMS:  Review of Systems  Constitutional: Negative.  Negative for chills, fever and malaise/fatigue.  HENT: Negative.  Negative for ear discharge, ear pain, hearing loss, nosebleeds and sore throat.   Eyes: Negative.  Negative for blurred vision and pain.  Respiratory: Negative.  Negative for cough, hemoptysis, shortness of breath and wheezing.   Cardiovascular: Negative.  Negative for chest pain, palpitations and leg swelling.  Gastrointestinal: Negative.  Negative for abdominal pain, blood in stool, diarrhea, nausea and vomiting.  Genitourinary: Negative.  Negative for dysuria.  Musculoskeletal: Negative.  Negative for back pain.  Skin: Negative.   Neurological: Negative for dizziness, tremors, speech change, focal weakness, seizures and headaches.  Endo/Heme/Allergies: Negative.  Does not bruise/bleed easily.   Psychiatric/Behavioral: Negative.  Negative for depression, hallucinations and suicidal ideas.     PHYSICAL EXAMINATION:  GENERAL:  84 y.o.-year-old patient lying in the bed with no acute distress.  NECK:  Supple, no jugular venous distention. No thyroid enlargement, no tenderness.  LUNGS: Normal breath sounds bilaterally, no wheezing, rales,rhonchi  No use of accessory muscles of respiration.  CARDIOVASCULAR: S1, S2 normal. No murmurs, rubs, or gallops.  ABDOMEN: Soft, non-tender, non-distended. Bowel sounds present. No organomegaly or mass.  EXTREMITIES: No pedal edema, cyanosis, or clubbing.  PSYCHIATRIC: The patient is alert and oriented x 3.  SKIN: She has bruising on her face and a little on her neck DATA REVIEW:   CBC Recent Labs  Lab 08/23/18 0352  WBC 5.5  HGB 7.5*  HCT 24.4*  PLT 222    Chemistries  Recent Labs  Lab 08/22/18 0646 08/23/18 0352  NA  --  131*  K  --  4.2  CL  --  100  CO2  --  24  GLUCOSE  --  88  BUN  --  19  CREATININE  --  0.86  CALCIUM  --  8.9  MG 1.9  --   AST 37  --   ALT 31  --   ALKPHOS 69  --   BILITOT 0.5  --     Cardiac Enzymes No results for input(s): TROPONINI in the last 168 hours.  Microbiology Results  @[email or web address] @  RADIOLOGY:  Ct Head Wo Contrast  Result Date: 08/22/2018 CLINICAL DATA:  Pt presents to ER via EMS [facility] after fall, pt has laceration to lower lip active bleeding pt takes blood thinners, pt reports was fixing blankets on her bed and fell swelling to left side of face noted and bruising, p.*comment was truncated*^38mL OMNIPAQUE IOHEXOL 300 MG/ML SOLNHead trauma, intracranial venous injury suspected; Facial trauma, fx suspected; C-spine trauma, high clinical risk (NEXUS/CCR) EXAM: CT HEAD WITHOUT CONTRAST CT MAXILLOFACIAL WITHOUT CONTRAST CT CERVICAL SPINE WITHOUT CONTRAST TECHNIQUE: Multidetector CT imaging of the head, cervical spine, and maxillofacial structures were performed using the  standard protocol without intravenous contrast. Multiplanar CT image reconstructions of the cervical spine and maxillofacial structures were also generated. COMPARISON:  Head CT 09/21/2017 the FINDINGS: CT HEAD FINDINGS Brain: No intracranial hemorrhage. No parenchymal contusion. No midline shift or mass effect. Basilar cisterns are patent. No skull base fracture.  No fluid in the paranasal sinuses or mastoid air cells. Orbits are normal. There are periventricular and subcortical white matter hypodensities. Generalized cortical atrophy. Deep white matter infarction in the RIGHT centrum semiovale. Vascular: No hyperdense vessel or unexpected calcification. Skull: Normal. Negative for fracture or focal lesion. Sinuses/Orbits: Mucosal thickening in the LEFT maxillary sinus. Orbits normal Other: None. CT MAXILLOFACIAL FINDINGS Osseous: No orbital fracture. Maxillary sinus walls are intact. There is thickening of the LEFT maxillary sinus wall related to chronic mucosal inflammation. Pterygoid plates are normal. Mandibular condyles located. No mandible fracture. Orbits: No proptosis. Some mild preseptal swelling on the LEFT. Intraconal contents are normal. Sinuses: Mucosal thickening in the LEFT maxillary sinus Soft tissues: small hematoma in theLEFT cheek (image 42/2). Hematoma in the soft tissue overlying this central mandible CT CERVICAL SPINE FINDINGS Alignment: Normal alignment of the cervical vertebral bodies. Skull base and vertebrae: Normal craniocervical junction. No loss of vertebral body height or disc height. Normal facet articulation. No evidence of fracture. Soft tissues and spinal canal: No prevertebral soft tissue swelling. No perispinal or epidural hematoma. Disc levels: Disc space narrowing at C4-C5 C6-C7. Associated endplate osteophytosis. Upper chest: Clear Other: None IMPRESSION: 1. No intracranial trauma. 2. Soft tissue hematoma over the LEFT cheek. Mild preseptal swelling over the LEFT orbit.  Hematoma midline anterior to the mandible symphysis. 3. No facial bone fracture. 4. Chronic sinus inflammation of the LEFT maxillary sinus. 5. No cervical spine fracture Electronically Signed   By: Genevive BiStewart  Edmunds M.D.   On: 08/22/2018 01:57   Ct Soft Tissue Neck W Contrast  Result Date: 08/22/2018 CLINICAL DATA:  Initial evaluation for acute trauma, fall. Contusion and laceration to lower lip. EXAM: CT NECK WITH CONTRAST TECHNIQUE: Multidetector CT imaging of the neck was performed using the standard protocol following the bolus administration of intravenous contrast. CONTRAST:  75mL OMNIPAQUE IOHEXOL 300 MG/ML  SOLN COMPARISON:  Concomitant CT of the face. FINDINGS: Pharynx and larynx: Oral cavity within normal limits. No base of tongue lesion. Oropharynx and nasopharynx within normal limits. No retropharyngeal collection. Epiglottis normal. Vallecula clear. Remainder of the hypopharynx and supraglottic larynx within normal limits. No acute abnormality about the glottis. Subglottic airway bowed to the right but widely patent and clear. Salivary glands: Salivary glands including the parotid and submandibular glands within normal limits. Thyroid: Thyroid heterogeneous with few scattered subcentimeter nodules present. Left lobe of thyroid asymmetrically enlarged as compared to the right. Lymph nodes: No adenopathy within the neck. Vascular: Extensive atherosclerotic change about the aortic arch without high-grade stenosis. Prominent carotid bifurcation atherosclerosis with associated narrowing of up to approximately 40% bilaterally. Great vessels remain patent within the neck. No appreciable active arterial contrast extravasation about the left facial contusion, although evaluation somewhat limited by streak artifact from dental amalgam. Limited intracranial: Unremarkable. Visualized orbits: Visualized globes and orbital soft tissues demonstrate no acute finding. Mastoids and visualized paranasal sinuses:  Chronic left maxillary sinusitis noted. Visualized paranasal sinuses are otherwise largely clear. Visualized mastoids and middle ear cavities are well pneumatized and free of fluid. Skeleton: No acute osseous abnormality identified. No discrete lytic or blastic osseous lesions. Moderate cervical spondylolysis noted throughout the cervical spine. Prominent periapical lucency at the right maxillary lateral incisor. Upper chest: Scattered adenopathy within the visualized mediastinum, largest of which measures up to 2.1 cm in short axis at the precarinal level. Scattered atelectatic changes noted within the visualized lungs. Underlying interlobular septal thickening suggest mild pulmonary interstitial congestion/edema. Few small foci of tree-in-bud nodular densities  within the peripheral right upper lobe suspicious for mild endobronchial infection (series 2, image 82). Other: Soft tissue contusion seen involving the left face with extensive soft tissue swelling about the lower lip. Superimposed laceration. Small multifocal hematoma within the subcutaneous fat of the left pre maxillary soft tissues measure up to 11 mm (series 2, image 22). No other discrete hematoma or drainable fluid collection. Again, no appreciable active contrast extravasation. IMPRESSION: 1. Soft tissue contusion with laceration involving the left face and lower lip. No appreciable active contrast extravasation identified. 2. Small area of scattered tree-in-bud nodular densities within the peripheral right upper lobe, suspicious for mild endobronchial infection. 3. Enlarged mediastinal adenopathy, nonspecific, but could be reactive. 4. Moderate to advanced atherosclerotic change about the aortic arch and carotid bifurcations bilaterally. Associated narrowing of up to approximately 40% at the proximal right ICAs bilaterally. 5. Chronic left maxillary sinusitis. Electronically Signed   By: Rise Mu M.D.   On: 08/22/2018 01:33   Ct Chest  Wo Contrast  Result Date: 08/22/2018 CLINICAL DATA:  84 year old female with acute respiratory distress. EXAM: CT CHEST WITHOUT CONTRAST TECHNIQUE: Multidetector CT imaging of the chest was performed following the standard protocol without IV contrast. COMPARISON:  Chest radiograph dated 08/22/2018 FINDINGS: Evaluation of this exam is limited in the absence of intravenous contrast. Cardiovascular: Mild cardiomegaly. Multi vessel coronary vascular calcification. No pericardial effusion. There is advanced atherosclerotic calcification of the thoracic aorta. The central pulmonary arteries are grossly unremarkable. Mediastinum/Nodes: No hilar or mediastinal adenopathy. There is a moderate size hiatal hernia. Bilateral thyroid nodules measure 18 mm on the left. Ultrasound may provide better evaluation. Lungs/Pleura: Pleural thickening versus trace right pleural effusion. There are bibasilar linear atelectasis/scarring. There is a cluster of nodular density in the right upper lobe, likely post inflammatory. Clinical correlation and follow-up recommended. There are biapical pleural thickening, right greater than left. No lobar consolidation or pneumothorax. The central airways are patent. Upper Abdomen: No acute abnormality. Musculoskeletal: Osteopenia. No acute osseous pathology. IMPRESSION: 1. A cluster of nodular density in the right upper lobe, likely post inflammatory. Clinical correlation and follow-up recommended. 2. Moderate size hiatal hernia. 3. Bilateral thyroid nodules. Ultrasound may provide better evaluation. Electronically Signed   By: Elgie Collard M.D.   On: 08/22/2018 04:44   Ct Cervical Spine Wo Contrast  Result Date: 08/22/2018 CLINICAL DATA:  Pt presents to ER via EMS from [facility] after fall, pt has laceration to lower lip active bleeding pt takes blood thinners, pt reports was fixing blankets on her bed and fell swelling to left side of face noted and bruising, p.*comment was  truncated*^44mL OMNIPAQUE IOHEXOL 300 MG/ML SOLNHead trauma, intracranial venous injury suspected; Facial trauma, fx suspected; C-spine trauma, high clinical risk (NEXUS/CCR) EXAM: CT HEAD WITHOUT CONTRAST CT MAXILLOFACIAL WITHOUT CONTRAST CT CERVICAL SPINE WITHOUT CONTRAST TECHNIQUE: Multidetector CT imaging of the head, cervical spine, and maxillofacial structures were performed using the standard protocol without intravenous contrast. Multiplanar CT image reconstructions of the cervical spine and maxillofacial structures were also generated. COMPARISON:  Head CT 09/21/2017 the FINDINGS: CT HEAD FINDINGS Brain: No intracranial hemorrhage. No parenchymal contusion. No midline shift or mass effect. Basilar cisterns are patent. No skull base fracture. No fluid in the paranasal sinuses or mastoid air cells. Orbits are normal. There are periventricular and subcortical white matter hypodensities. Generalized cortical atrophy. Deep white matter infarction in the RIGHT centrum semiovale. Vascular: No hyperdense vessel or unexpected calcification. Skull: Normal. Negative for fracture or focal lesion. Sinuses/Orbits:  Mucosal thickening in the LEFT maxillary sinus. Orbits normal Other: None. CT MAXILLOFACIAL FINDINGS Osseous: No orbital fracture. Maxillary sinus walls are intact. There is thickening of the LEFT maxillary sinus wall related to chronic mucosal inflammation. Pterygoid plates are normal. Mandibular condyles located. No mandible fracture. Orbits: No proptosis. Some mild preseptal swelling on the LEFT. Intraconal contents are normal. Sinuses: Mucosal thickening in the LEFT maxillary sinus Soft tissues: small hematoma in theLEFT cheek (image 42/2). Hematoma in the soft tissue overlying this central mandible CT CERVICAL SPINE FINDINGS Alignment: Normal alignment of the cervical vertebral bodies. Skull base and vertebrae: Normal craniocervical junction. No loss of vertebral body height or disc height. Normal facet  articulation. No evidence of fracture. Soft tissues and spinal canal: No prevertebral soft tissue swelling. No perispinal or epidural hematoma. Disc levels: Disc space narrowing at C4-C5 C6-C7. Associated endplate osteophytosis. Upper chest: Clear Other: None IMPRESSION: 1. No intracranial trauma. 2. Soft tissue hematoma over the LEFT cheek. Mild preseptal swelling over the LEFT orbit. Hematoma midline anterior to the mandible symphysis. 3. No facial bone fracture. 4. Chronic sinus inflammation of the LEFT maxillary sinus. 5. No cervical spine fracture Electronically Signed   By: Genevive Bi M.D.   On: 08/22/2018 01:57   Dg Chest Portable 1 View  Result Date: 08/22/2018 CLINICAL DATA:  84 year old female with fall. EXAM: PORTABLE CHEST 1 VIEW COMPARISON:  Chest radiograph dated 03/31/2016 FINDINGS: The lungs are clear. There is no pleural effusion or pneumothorax. Mild cardiomegaly. Atherosclerotic calcification of the aortic arch. Minimal angulation of the distal left clavicle appears old. No acute osseous pathology. IMPRESSION: No active disease. Electronically Signed   By: Elgie Collard M.D.   On: 08/22/2018 04:01   Ct Maxillofacial Wo Contrast  Result Date: 08/22/2018 CLINICAL DATA:  Pt presents to ER via EMS from [facility] after fall, pt has laceration to lower lip active bleeding pt takes blood thinners, pt reports was fixing blankets on her bed and fell swelling to left side of face noted and bruising, p.*comment was truncated*^106mL OMNIPAQUE IOHEXOL 300 MG/ML SOLNHead trauma, intracranial venous injury suspected; Facial trauma, fx suspected; C-spine trauma, high clinical risk (NEXUS/CCR) EXAM: CT HEAD WITHOUT CONTRAST CT MAXILLOFACIAL WITHOUT CONTRAST CT CERVICAL SPINE WITHOUT CONTRAST TECHNIQUE: Multidetector CT imaging of the head, cervical spine, and maxillofacial structures were performed using the standard protocol without intravenous contrast. Multiplanar CT image reconstructions of  the cervical spine and maxillofacial structures were also generated. COMPARISON:  Head CT 09/21/2017 the FINDINGS: CT HEAD FINDINGS Brain: No intracranial hemorrhage. No parenchymal contusion. No midline shift or mass effect. Basilar cisterns are patent. No skull base fracture. No fluid in the paranasal sinuses or mastoid air cells. Orbits are normal. There are periventricular and subcortical white matter hypodensities. Generalized cortical atrophy. Deep white matter infarction in the RIGHT centrum semiovale. Vascular: No hyperdense vessel or unexpected calcification. Skull: Normal. Negative for fracture or focal lesion. Sinuses/Orbits: Mucosal thickening in the LEFT maxillary sinus. Orbits normal Other: None. CT MAXILLOFACIAL FINDINGS Osseous: No orbital fracture. Maxillary sinus walls are intact. There is thickening of the LEFT maxillary sinus wall related to chronic mucosal inflammation. Pterygoid plates are normal. Mandibular condyles located. No mandible fracture. Orbits: No proptosis. Some mild preseptal swelling on the LEFT. Intraconal contents are normal. Sinuses: Mucosal thickening in the LEFT maxillary sinus Soft tissues: small hematoma in theLEFT cheek (image 42/2). Hematoma in the soft tissue overlying this central mandible CT CERVICAL SPINE FINDINGS Alignment: Normal alignment of the cervical vertebral  bodies. Skull base and vertebrae: Normal craniocervical junction. No loss of vertebral body height or disc height. Normal facet articulation. No evidence of fracture. Soft tissues and spinal canal: No prevertebral soft tissue swelling. No perispinal or epidural hematoma. Disc levels: Disc space narrowing at C4-C5 C6-C7. Associated endplate osteophytosis. Upper chest: Clear Other: None IMPRESSION: 1. No intracranial trauma. 2. Soft tissue hematoma over the LEFT cheek. Mild preseptal swelling over the LEFT orbit. Hematoma midline anterior to the mandible symphysis. 3. No facial bone fracture. 4. Chronic  sinus inflammation of the LEFT maxillary sinus. 5. No cervical spine fracture Electronically Signed   By: Genevive Bi M.D.   On: 08/22/2018 01:57      Allergies as of 08/23/2018      Reactions   Mushroom Extract Complex    Penicillins    Has patient had a PCN reaction causing immediate rash, facial/tongue/throat swelling, SOB or lightheadedness with hypotension: Yes Has patient had a PCN reaction causing severe rash involving mucus membranes or skin necrosis: No Has patient had a PCN reaction that required hospitalization No Has patient had a PCN reaction occurring within the last 10 years: Yes If all of the above answers are "NO", then may proceed with Cephalosporin use.   Aloe Hives, Rash   Amlodipine Hives, Rash, Hypertension   Cephalexin Rash   Metoprolol Hives, Palpitations, Hypertension   Miconazole Swelling, Rash   Neosporin [neomycin-bacitracin Zn-polymyx] Rash   Trandolapril-verapamil Hcl Er Rash      Medication List    TAKE these medications   acetaminophen 500 MG tablet Commonly known as:  TYLENOL Take 500 mg by mouth every 8 (eight) hours as needed.   aspirin 81 MG EC tablet Take 1 tablet (81 mg total) daily by mouth.   atenolol 50 MG tablet Commonly known as:  TENORMIN Take 1 tablet (50 mg total) by mouth daily. What changed:    medication strength  how much to take   BIOFREEZE 4 % Gel Generic drug:  Menthol (Topical Analgesic) Apply 1 application topically 2 (two) times daily. To neck and shoulder   CERTAVITE/ANTIOXIDANTS Tabs Take 1 tablet by mouth daily.   citalopram 20 MG tablet Commonly known as:  CELEXA Take 20 mg by mouth daily.   clobetasol cream 0.05 % Commonly known as:  TEMOVATE Apply 1 application topically 2 (two) times daily as needed. To back, buttocks, and private area   clopidogrel 75 MG tablet Commonly known as:  PLAVIX Take 1 tablet (75 mg total) by mouth daily.   diclofenac sodium 1 % Gel Commonly known as:   VOLTAREN Apply 2 g topically 3 (three) times daily.   ferrous sulfate 325 (65 FE) MG tablet Take 1 tablet (325 mg total) by mouth daily with breakfast.   hydrALAZINE 50 MG tablet Commonly known as:  APRESOLINE Take 1 tablet (50 mg total) by mouth 3 (three) times daily.   lisinopril 40 MG tablet Commonly known as:  PRINIVIL,ZESTRIL Take 1 tablet (40 mg total) by mouth daily.   mupirocin ointment 2 % Commonly known as:  BACTROBAN Apply topically 4 (four) times daily.   omega-3 acid ethyl esters 1 g capsule Commonly known as:  LOVAZA Take 1 g by mouth 2 (two) times daily.   pantoprazole 40 MG tablet Commonly known as:  PROTONIX Take 1 tablet (40 mg total) by mouth daily.   polyethylene glycol packet Commonly known as:  MIRALAX / GLYCOLAX Take 17 g by mouth daily. What changed:    when  to take this  reasons to take this   potassium chloride 10 MEQ tablet Commonly known as:  K-DUR,KLOR-CON Take 1 tablet (10 mEq total) by mouth 2 (two) times daily.   rosuvastatin 20 MG tablet Commonly known as:  CRESTOR Take 1 tablet (20 mg total) at bedtime by mouth.   senna-docusate 8.6-50 MG tablet Commonly known as:  Senokot-S Take 1 tablet by mouth daily as needed.   spironolactone 25 MG tablet Commonly known as:  ALDACTONE Take 1 tablet (25 mg total) by mouth daily. Start taking on:  08/24/2018 What changed:  how much to take   SYSTANE ULTRA 0.4-0.3 % Soln Generic drug:  Polyethyl Glycol-Propyl Glycol Apply 1 drop to eye 3 (three) times daily.   traMADol 50 MG tablet Commonly known as:  ULTRAM Take 50 mg by mouth 3 (three) times daily as needed.   Vitamin D 50 MCG (2000 UT) tablet Take 2,000 Units by mouth daily.            Durable Medical Equipment  (From admission, onward)         Start     Ordered   08/23/18 0813  For home use only DME Walker rolling  Once    Question:  Patient needs a walker to treat with the following condition  Answer:  Weakness    08/23/18 0812              Management plans discussed with the patient and she is in agreement. Stable for discharge   Patient should follow up with PCP  CODE STATUS:     Code Status Orders  (From admission, onward)         Start     Ordered   08/22/18 0549  Do not attempt resuscitation (DNR)  Continuous    Question Answer Comment  In the event of cardiac or respiratory ARREST Do not call a "code blue"   In the event of cardiac or respiratory ARREST Do not perform Intubation, CPR, defibrillation or ACLS   In the event of cardiac or respiratory ARREST Use medication by any route, position, wound care, and other measures to relive pain and suffering. May use oxygen, suction and manual treatment of airway obstruction as needed for comfort.      08/22/18 0548        Code Status History    Date Active Date Inactive Code Status Order ID Comments User Context   08/01/2017 1711 08/13/2017 2110 DNR [id number]  Jacquelynn Cree, PA-C Inpatient   08/01/2017 1710 08/01/2017 1711 DNR [id number]  Jacquelynn Cree, PA-C Inpatient   07/29/2017 1820 08/01/2017 1637 DNR [id number]  Shaune Pollack, MD Inpatient   03/31/2016 1523 04/03/2016 1750 Full Code [id number]  Hower, Cletis [place], MD ED      TOTAL TIME TAKING CARE OF THIS PATIENT: 38 minutes.    Note: This dictation was prepared with Dragon dictation along with smaller phrase technology. Any transcriptional errors that result from this process are unintentional.  Myiah Petkus M.D on 08/23/2018 at 10:56 AM  Between 7am to 6pm - Pager - [phone] After 6pm go to [email or web address] - Social research officer, government  [facility]  Office  [phone]  CC: Primary care physician; Kandyce Rud, MD

## 2018-08-23 NOTE — Plan of Care (Signed)

## 2018-08-23 NOTE — Progress Notes (Addendum)
Patient going back to [facility] via family. IV taken out and tele monitor off. Discharge paperwork given.

## 2018-08-23 NOTE — NC FL2 (Signed)
[place] MEDICAID FL2 LEVEL OF CARE SCREENING TOOL     IDENTIFICATION  Patient Name: Debbie Hampton Birthdate: Aug 12, 1932 Sex: female Admission Date (Current Location): 08/21/2018  [place] and [place] Number:  Chiropodist and Address:  [facility], [address], [place], [place] [address]      Provider Number: [id number]  Attending Physician Name and Address:  Adrian Saran, MD  Relative Name and Phone Number:  Neyla Gauntt (Son/HCPOA) [phone]    Current Level of Care: Hospital Recommended Level of Care: Assisted Living Facility Prior Approval Number:    Date Approved/Denied:   PASRR Number: [id number]  Discharge Plan: Domiciliary (Rest home)    Current Diagnoses: Patient Active Problem List   Diagnosis Date Noted  . Labile blood pressure   . Benign essential HTN   . Hypertensive crisis   . Hyponatremia   . Prediabetes   . Hypoalbuminemia due to protein-calorie malnutrition (HCC)   . Acute blood loss anemia   . 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 Acute CVA (cerebrovascular accident) (HCC) 07/29/2017  . Syncope, near 03/31/2016    Orientation RESPIRATION BLADDER Height & Weight     Self, Situation, Place, Time  Normal Continent Weight: 120 lb (54.4 kg) Height:  5\' 1"  (154.9 cm)  BEHAVIORAL SYMPTOMS/MOOD NEUROLOGICAL BOWEL NUTRITION STATUS      Continent Diet(Heart Healthy)  AMBULATORY STATUS COMMUNICATION OF NEEDS Skin   Supervision Verbally Normal                       Personal Care Assistance Level of Assistance  Bathing, Feeding, Dressing Bathing Assistance: Limited assistance Feeding assistance: Independent Dressing Assistance: Limited assistance     Functional Limitations Info  Sight, Hearing, Speech Sight Info: Adequate Hearing Info: Impaired Speech Info: Adequate    SPECIAL CARE FACTORS FREQUENCY  PT (By licensed PT)     PT Frequency: HHPT Up to 3X per week              Contractures  Contractures Info: Not present    Additional Factors Info  Code Status, Allergies Code Status Info: DNR Allergies Info: Mushroom Extract Complex, Penicillins, Aloe, Amlodipine, Cephalexin, Metoprolol, Miconazole, Neosporin Neomycin-bacitracin Zn-polymyx, Trandolapril-verapamil Hcl Er           Current Medications (08/23/2018):  This is the current hospital active medication list Current Facility-Administered Medications  Medication Dose Route Frequency Provider Last Rate Last Dose  . acetaminophen (TYLENOL) tablet 650 mg  650 mg Oral Q6H PRN Barbaraann Rondo, MD   650 mg at 08/22/18 0500   Or  . acetaminophen (TYLENOL) suppository 650 mg  650 mg Rectal Q6H PRN Barbaraann Rondo, MD      . aspirin EC tablet 81 mg  81 mg Oral Daily Barbaraann Rondo, MD   81 mg at 08/23/18 0818  . atenolol (TENORMIN) tablet 100 mg  100 mg Oral Daily Barbaraann Rondo, MD   Stopped at 08/23/18 0945  . bisacodyl (DULCOLAX) EC tablet 5 mg  5 mg Oral Daily PRN Barbaraann Rondo, MD   5 mg at 08/22/18 1612  . citalopram (CELEXA) tablet 10 mg  10 mg Oral Daily Barbaraann Rondo, MD   10 mg at 08/23/18 0818  . hydrALAZINE (APRESOLINE) tablet 50 mg  50 mg Oral TID Barbaraann Rondo, MD   50 mg at 08/23/18 0819  . lisinopril (PRINIVIL,ZESTRIL) tablet 40 mg  40 mg Oral Daily Barbaraann Rondo, MD   40 mg at 08/23/18 0819  .  morphine 2 MG/ML injection 2 mg  2 mg Intravenous Q3H PRN Barbaraann Rondo, MD   2 mg at 08/23/18 0817   Or  . morphine 4 MG/ML injection 4 mg  4 mg Intravenous Q3H PRN Barbaraann Rondo, MD      . mupirocin ointment (BACTROBAN) 2 %   Topical QID Bud Face, MD      . ondansetron (ZOFRAN) tablet 4 mg  4 mg Oral Q6H PRN Barbaraann Rondo, MD       Or  . ondansetron (ZOFRAN) injection 4 mg  4 mg Intravenous Q6H PRN Barbaraann Rondo, MD      . pantoprazole (PROTONIX) EC tablet 40 mg  40 mg Oral Daily Barbaraann Rondo, MD   40 mg at 08/23/18 0819  . polyethylene  glycol (MIRALAX / GLYCOLAX) packet 17 g  17 g Oral Daily Barbaraann Rondo, MD   17 g at 08/23/18 0820  . rosuvastatin (CRESTOR) tablet 20 mg  20 mg Oral QHS Barbaraann Rondo, MD   20 mg at 08/22/18 2123  . senna-docusate (Senokot-S) tablet 1 tablet  1 tablet Oral QHS PRN Barbaraann Rondo, MD   1 tablet at 08/22/18 2123  . spironolactone (ALDACTONE) tablet 25 mg  25 mg Oral Daily Barbaraann Rondo, MD   25 mg at 08/23/18 1610     Discharge Medications: Medication List        TAKE these medications       acetaminophen 500 MG tablet Commonly known as:  TYLENOL Take 500 mg by mouth every 8 (eight) hours as needed.   aspirin 81 MG EC tablet Take 1 tablet (81 mg total) daily by mouth.   atenolol 50 MG tablet Commonly known as:  TENORMIN Take 1 tablet (50 mg total) by mouth daily. What changed:    medication strength  how much to take   BIOFREEZE 4 % Gel Generic drug:  Menthol (Topical Analgesic) Apply 1 application topically 2 (two) times daily. To neck and shoulder   CERTAVITE/ANTIOXIDANTS Tabs Take 1 tablet by mouth daily.   citalopram 20 MG tablet Commonly known as:  CELEXA Take 20 mg by mouth daily.   clobetasol cream 0.05 % Commonly known as:  TEMOVATE Apply 1 application topically 2 (two) times daily as needed. To back, buttocks, and private area   clopidogrel 75 MG tablet Commonly known as:  PLAVIX Take 1 tablet (75 mg total) by mouth daily.   diclofenac sodium 1 % Gel Commonly known as:  VOLTAREN Apply 2 g topically 3 (three) times daily.   ferrous sulfate 325 (65 FE) MG tablet Take 1 tablet (325 mg total) by mouth daily with breakfast.   hydrALAZINE 50 MG tablet Commonly known as:  APRESOLINE Take 1 tablet (50 mg total) by mouth 3 (three) times daily.   lisinopril 40 MG tablet Commonly known as:  PRINIVIL,ZESTRIL Take 1 tablet (40 mg total) by mouth daily.   mupirocin ointment 2 % Commonly known as:  BACTROBAN Apply topically  4 (four) times daily.   omega-3 acid ethyl esters 1 g capsule Commonly known as:  LOVAZA Take 1 g by mouth 2 (two) times daily.   pantoprazole 40 MG tablet Commonly known as:  PROTONIX Take 1 tablet (40 mg total) by mouth daily.   polyethylene glycol packet Commonly known as:  MIRALAX / GLYCOLAX Take 17 g by mouth daily. What changed:    when to take this  reasons to take this   potassium chloride 10 MEQ tablet Commonly known as:  K-DUR,KLOR-CON Take 1 tablet (10 mEq total) by mouth 2 (two) times daily.   rosuvastatin 20 MG tablet Commonly known as:  CRESTOR Take 1 tablet (20 mg total) at bedtime by mouth.   senna-docusate 8.6-50 MG tablet Commonly known as:  Senokot-S Take 1 tablet by mouth daily as needed.   spironolactone 25 MG tablet Commonly known as:  ALDACTONE Take 1 tablet (25 mg total) by mouth daily. Start taking on:  08/24/2018 What changed:  how much to take   SYSTANE ULTRA 0.4-0.3 % Soln Generic drug:  Polyethyl Glycol-Propyl Glycol Apply 1 drop to eye 3 (three) times daily.   traMADol 50 MG tablet Commonly known as:  ULTRAM Take 50 mg by mouth 3 (three) times daily as needed.   Vitamin D 50 MCG (2000 UT) tablet Take 2,000 Units by mouth daily.    Relevant Imaging Results:  Relevant Lab Results:   Additional Information SS# [id number]  Judi CongKaren M Joyleen Haselton, LCSW

## 2019-11-26 ENCOUNTER — Inpatient Hospital Stay
Admission: EM | Admit: 2019-11-26 | Discharge: 2019-11-28 | DRG: 309 | Disposition: A | Discharge status: Home / Self Care | Payer: Medicare Other | Source: Skilled Nursing Facility | Attending: Internal Medicine | Admitting: Internal Medicine

## 2019-11-26 ENCOUNTER — Other Ambulatory Visit: Payer: Self-pay

## 2019-11-26 ENCOUNTER — Encounter: Payer: Self-pay | Admitting: Internal Medicine

## 2019-11-26 DIAGNOSIS — Z7902 Long term (current) use of antithrombotics/antiplatelets: Secondary | ICD-10-CM

## 2019-11-26 DIAGNOSIS — Z8249 Family history of ischemic heart disease and other diseases of the circulatory system: Secondary | ICD-10-CM

## 2019-11-26 DIAGNOSIS — Z7982 Long term (current) use of aspirin: Secondary | ICD-10-CM

## 2019-11-26 DIAGNOSIS — Z888 Allergy status to other drugs, medicaments and biological substances status: Secondary | ICD-10-CM

## 2019-11-26 DIAGNOSIS — Z87891 Personal history of nicotine dependence: Secondary | ICD-10-CM

## 2019-11-26 DIAGNOSIS — I4891 Unspecified atrial fibrillation: Secondary | ICD-10-CM | POA: Diagnosis not present

## 2019-11-26 DIAGNOSIS — Z880 Allergy status to penicillin: Secondary | ICD-10-CM

## 2019-11-26 DIAGNOSIS — R296 Repeated falls: Secondary | ICD-10-CM | POA: Diagnosis present

## 2019-11-26 DIAGNOSIS — Z881 Allergy status to other antibiotic agents status: Secondary | ICD-10-CM

## 2019-11-26 DIAGNOSIS — Z20822 Contact with and (suspected) exposure to covid-19: Secondary | ICD-10-CM | POA: Diagnosis present

## 2019-11-26 DIAGNOSIS — Z8673 Personal history of transient ischemic attack (TIA), and cerebral infarction without residual deficits: Secondary | ICD-10-CM | POA: Diagnosis not present

## 2019-11-26 DIAGNOSIS — E871 Hypo-osmolality and hyponatremia: Secondary | ICD-10-CM | POA: Diagnosis present

## 2019-11-26 DIAGNOSIS — I10 Essential (primary) hypertension: Secondary | ICD-10-CM | POA: Diagnosis present

## 2019-11-26 DIAGNOSIS — E860 Dehydration: Secondary | ICD-10-CM | POA: Diagnosis present

## 2019-11-26 DIAGNOSIS — E785 Hyperlipidemia, unspecified: Secondary | ICD-10-CM | POA: Diagnosis present

## 2019-11-26 DIAGNOSIS — Z8679 Personal history of other diseases of the circulatory system: Secondary | ICD-10-CM | POA: Diagnosis not present

## 2019-11-26 DIAGNOSIS — K219 Gastro-esophageal reflux disease without esophagitis: Secondary | ICD-10-CM | POA: Diagnosis present

## 2019-11-26 DIAGNOSIS — I4892 Unspecified atrial flutter: Secondary | ICD-10-CM | POA: Diagnosis present

## 2019-11-26 DIAGNOSIS — R778 Other specified abnormalities of plasma proteins: Secondary | ICD-10-CM | POA: Diagnosis present

## 2019-11-26 DIAGNOSIS — Z91018 Allergy to other foods: Secondary | ICD-10-CM | POA: Diagnosis not present

## 2019-11-26 DIAGNOSIS — Z66 Do not resuscitate: Secondary | ICD-10-CM | POA: Diagnosis present

## 2019-11-26 DIAGNOSIS — Z79899 Other long term (current) drug therapy: Secondary | ICD-10-CM

## 2019-11-26 DIAGNOSIS — I248 Other forms of acute ischemic heart disease: Secondary | ICD-10-CM | POA: Diagnosis present

## 2019-11-26 DIAGNOSIS — R7303 Prediabetes: Secondary | ICD-10-CM | POA: Diagnosis present

## 2019-11-26 LAB — CBC
HCT: 36.1 % (ref 36.0–46.0)
Hemoglobin: 12.2 g/dL (ref 12.0–15.0)
MCH: 30.3 pg (ref 26.0–34.0)
MCHC: 33.8 g/dL (ref 30.0–36.0)
MCV: 89.6 fL (ref 80.0–100.0)
Platelets: 237 10*3/uL (ref 150–400)
RBC: 4.03 MIL/uL (ref 3.87–5.11)
RDW: 12.4 % (ref 11.5–15.5)
WBC: 6.9 10*3/uL (ref 4.0–10.5)
nRBC: 0 % (ref 0.0–0.2)

## 2019-11-26 LAB — TROPONIN I (HIGH SENSITIVITY)
Troponin I (High Sensitivity): 41 ng/L — ABNORMAL HIGH (ref <18)
Troponin I (High Sensitivity): 165 ng/L (ref <18)
Troponin I (High Sensitivity): 257 ng/L (ref <18)
Troponin I (High Sensitivity): 218 ng/L (ref <18)

## 2019-11-26 LAB — URINALYSIS, COMPLETE (UACMP) WITH MICROSCOPIC
Bacteria, UA: NONE SEEN
Bilirubin Urine: NEGATIVE
Glucose, UA: NEGATIVE mg/dL
Hgb urine dipstick: NEGATIVE
Ketones, ur: NEGATIVE mg/dL
Leukocytes,Ua: NEGATIVE
Nitrite: NEGATIVE
Protein, ur: NEGATIVE mg/dL
Specific Gravity, Urine: 1.018 (ref 1.005–1.030)
pH: 6 (ref 5.0–8.0)

## 2019-11-26 LAB — BASIC METABOLIC PANEL
Anion gap: 6 (ref 5–15)
Anion gap: 9 (ref 5–15)
BUN: 18 mg/dL (ref 8–23)
BUN: 18 mg/dL (ref 8–23)
CO2: 25 mmol/L (ref 22–32)
CO2: 22 mmol/L (ref 22–32)
Calcium: 9.2 mg/dL (ref 8.9–10.3)
Calcium: 9.3 mg/dL (ref 8.9–10.3)
Chloride: 97 mmol/L — ABNORMAL LOW (ref 98–111)
Chloride: 97 mmol/L — ABNORMAL LOW (ref 98–111)
Creatinine, Ser: 1.12 mg/dL — ABNORMAL HIGH (ref 0.44–1.00)
Creatinine, Ser: 0.91 mg/dL (ref 0.44–1.00)
GFR calc Af Amer: 52 mL/min — ABNORMAL LOW (ref >60)
GFR calc Af Amer: >60 mL/min (ref >60)
GFR calc non Af Amer: 45 mL/min — ABNORMAL LOW (ref >60)
GFR calc non Af Amer: 58 mL/min — ABNORMAL LOW (ref >60)
Glucose, Bld: 106 mg/dL — ABNORMAL HIGH (ref 70–99)
Glucose, Bld: 211 mg/dL — ABNORMAL HIGH (ref 70–99)
Potassium: 5.3 mmol/L — ABNORMAL HIGH (ref 3.5–5.1)
Potassium: 4.2 mmol/L (ref 3.5–5.1)
Sodium: 128 mmol/L — ABNORMAL LOW (ref 135–145)
Sodium: 128 mmol/L — ABNORMAL LOW (ref 135–145)

## 2019-11-26 LAB — PROTIME-INR
INR: 0.9 (ref 0.8–1.2)
Prothrombin Time: 12.5 seconds (ref 11.4–15.2)

## 2019-11-26 LAB — APTT: aPTT: 30 seconds (ref 24–36)

## 2019-11-26 MED ORDER — ONDANSETRON HCL 4 MG/2ML IJ SOLN: 4.0000 mg | Freq: Four times a day (QID) | INTRAMUSCULAR | Status: DC | PRN

## 2019-11-26 MED ORDER — ALUM & MAG HYDROXIDE-SIMETH 200-200-20 MG/5ML PO SUSP
30.0000 mL | Freq: Four times a day (QID) | ORAL | Status: DC | PRN
  Administered 2019-11-27: 30 mL via ORAL
  Filled 2019-11-26: qty 30

## 2019-11-26 MED ORDER — HEPARIN (PORCINE) 25000 UT/250ML-% IV SOLN
750.0000 [IU]/h | INTRAVENOUS | Status: DC
  Administered 2019-11-26: 800 [IU]/h via INTRAVENOUS
  Administered 2019-11-28: 750 [IU]/h via INTRAVENOUS
  Filled 2019-11-26 (×2): qty 250

## 2019-11-26 MED ORDER — HEPARIN BOLUS VIA INFUSION
2500.0000 [IU] | Freq: Once | INTRAVENOUS | Status: AC
  Administered 2019-11-26: 2500 [IU] via INTRAVENOUS
  Filled 2019-11-26: qty 2500

## 2019-11-26 MED ORDER — ONDANSETRON HCL 4 MG PO TABS: 4.0000 mg | ORAL_TABLET | Freq: Four times a day (QID) | ORAL | Status: DC | PRN

## 2019-11-26 MED ORDER — ENOXAPARIN SODIUM 40 MG/0.4ML ~~LOC~~ SOLN: 40.0000 mg | SUBCUTANEOUS | Status: DC

## 2019-11-26 NOTE — Plan of Care (Signed)

## 2019-11-26 NOTE — H&P (Addendum)
History and Physical    Sumayah Bearse IWP:[id number] DOB: 1932-05-25 DOA: 11/26/2019  Referring MD/NP/PA:   PCP: System, Pcp Not In   Patient coming from:  The patient is coming from SNF.    Chief Complaint: BP low and irregular heat beat  HPI: Hue Steveson is a 86 y.o. female with medical history significant of HTN, prediabetes, h/o CVA with no residual deficit, presented to ED with low BP and irregular heat beat.  Pt had an episode of headache at noon and requesting for tylenol. BP was checked by staff and found BP high, later BP was found low, exact number not clear. She was also found to have irregular heart beat. She reports feeling heart beat slow and fast. She felt chest tightness.  Not associated with cough, diaphoresis, nausea, vomit or dizziness.  Her symptoms resolved in ED. she denies recent fever, chills, chest pain, abdominal pain, diarrhea or dysuria.  ED Course: initially HR 129, later in 80s, EKG showed atrial fibrillation. BP 121/79-155/61. Na 128, Cl 97, trop 41 -> 165. Pt was admit for elevated troponin.   Review of Systems:   General: no fevers, chills, no body weight gain HEENT: no blurry vision, hearing changes or sore throat Respiratory: no dyspnea, coughing, wheezing CV: chest tightness and palpitations GI: no nausea, vomiting, abdominal pain, diarrhea, constipation GU: no dysuria, burning on urination, increased urinary frequency, hematuria  Ext: no leg edema Neuro: no unilateral weakness, numbness, or tingling, no vision change or hearing loss Skin: no rash, no skin tear. MSK: No muscle spasm, no deformity, no limitation of range of movement in spin Heme: No easy bruising.  Travel history: No recent long distant travel.  Allergy:  Allergies  Allergen Reactions  . Mushroom Extract Complex   . Penicillins     Has patient had a PCN reaction causing immediate rash, facial/tongue/throat swelling, SOB or lightheadedness with hypotension: Yes Has patient had  a PCN reaction causing severe rash involving mucus membranes or skin necrosis: No Has patient had a PCN reaction that required hospitalization No Has patient had a PCN reaction occurring within the last 10 years: Yes If all of the above answers are "NO", then may proceed with Cephalosporin use.   . Aloe Hives and Rash  . Amlodipine Hives, Rash and Hypertension  . Cephalexin Rash  . Metoprolol Hives, Palpitations and Hypertension  . Miconazole Swelling and Rash  . Neosporin [Neomycin-Bacitracin Zn-Polymyx] Rash  . Trandolapril-Verapamil Hcl Er Rash    Past Medical History:  Diagnosis Date  . Bilateral shoulder pain   . Colon polyp   . Constipation   . GERD (gastroesophageal reflux disease)   . Hypertension   . Impaired fasting glucose   . Syncope 03/2016   with [facility]  . Varicose veins of both lower extremities     Past Surgical History:  Procedure Laterality Date  . TONSILLECTOMY    . TUMOR REMOVAL     ovaries    Social History:  reports that she has quit smoking. Her smoking use included cigarettes. She has never used smokeless tobacco. She reports that she does not drink alcohol or use drugs.  Family History:  Family History  Problem Relation Age of Onset  . Hypertension Other   . Breast cancer Sister   . Kidney cancer Neg Hx   . Kidney disease Neg Hx   . Prostate cancer Neg Hx      Prior to Admission medications   Medication Sig Start Date End Date Taking?  Authorizing Provider  acetaminophen (TYLENOL) 500 MG tablet Take 500 mg by mouth every 8 (eight) hours as needed.   Yes [provider]  aspirin EC 81 MG EC tablet Take 1 tablet (81 mg total) daily by mouth. 08/01/17  Yes Mody, Sital, MD  atenolol (TENORMIN) 50 MG tablet Take 1 tablet (50 mg total) by mouth daily. 08/23/18  Yes Adrian Saran, MD  Cholecalciferol (VITAMIN D) 2000 units tablet Take 2,000 Units by mouth daily.    Yes [provider]  citalopram (CELEXA) 20 MG tablet Take 20 mg by  mouth daily.    Yes [provider]  clobetasol cream (TEMOVATE) 0.05 % Apply 1 application topically 2 (two) times daily as needed (rash or itching). (apply to feet, ankles, arms, back, buttocks and perineal area)   Yes [provider]  clopidogrel (PLAVIX) 75 MG tablet Take 1 tablet (75 mg total) by mouth daily. 08/13/17  Yes Love, Evlyn Kanner, PA-C  hydrALAZINE (APRESOLINE) 50 MG tablet Take 1 tablet (50 mg total) by mouth 3 (three) times daily. 08/13/17  Yes Love, Evlyn Kanner, PA-C  iron polysaccharides (POLY-IRON 150) 150 MG capsule Take 150 mg by mouth daily.   Yes [provider]  lisinopril (PRINIVIL,ZESTRIL) 40 MG tablet Take 1 tablet (40 mg total) by mouth daily. 08/13/17  Yes Love, Evlyn Kanner, PA-C  Multiple Vitamins-Minerals (CERTAVITE/ANTIOXIDANTS) TABS Take 1 tablet by mouth daily.   Yes [provider]  omega-3 acid ethyl esters (LOVAZA) 1 g capsule Take 1 g by mouth 2 (two) times daily.   Yes [provider]  pantoprazole (PROTONIX) 40 MG tablet Take 1 tablet (40 mg total) by mouth daily. 08/14/17  Yes Love, Evlyn Kanner, PA-C  Polyethyl Glycol-Propyl Glycol (SYSTANE ULTRA) 0.4-0.3 % SOLN Apply 1 drop to eye 3 (three) times daily.   Yes [provider]  polyethylene glycol (MIRALAX / GLYCOLAX) packet Take 17 g by mouth daily. Patient taking differently: Take 17 g by mouth daily.  08/14/17  Yes Love, Evlyn Kanner, PA-C  potassium chloride (K-DUR,KLOR-CON) 10 MEQ tablet Take 1 tablet (10 mEq total) by mouth 2 (two) times daily. 08/13/17  Yes Love, Evlyn Kanner, PA-C  rosuvastatin (CRESTOR) 20 MG tablet Take 20 mg by mouth at bedtime.   Yes [provider]  sodium chloride (OCEAN) 0.65 % SOLN nasal spray Place 1 spray into both nostrils 3 (three) times daily as needed for congestion.   Yes [provider]  spironolactone (ALDACTONE) 25 MG tablet Take 1 tablet (25 mg total) by mouth daily. 08/24/18  Yes Adrian Saran, MD    Physical Exam:  Vitals:   11/26/19 1527 11/26/19 1821 11/26/19 2110 11/27/19 0147  BP: (!) 155/83 (!) 154/93 (!) 141/76 (!) 155/61  Pulse: 73 89 87   Resp: 18 16    Temp: 98 F (36.7 C)  97.9 F (36.6 C)   TempSrc: Oral  Oral   SpO2: 98% 97% 98%   Weight:   60 kg   Height:   5\' 2"  (1.575 m)    General: Not in acute distress HEENT:       Eyes: PERRL, EOMI, no scleral icterus.       ENT: Hearing impairment with hearing aids.        Neck: No JVD, no bruit, no mass felt. Heme: No neck lymph node enlargement. Cardiac: S1/S2, irregular, 3/6 systolic murmurs at apex, No gallops or rubs. Respiratory: Good air movement bilaterally. No rales, wheezing, rhonchi or rubs. GI:  Soft, nondistended, nontender, no rebound pain, no organomegaly, BS present. GU: No hematuria Ext: No pitting leg edema bilaterally.  Musculoskeletal: No joint redness or warmth,  Skin: No rashes.  Neuro: Alert, oriented X3, cranial nerves II-XII grossly intact, moves all extremities normally. Muscle strength 5/5 in all extremities, sensation to light touch intact.  Psych: Patient is not psychotic, no suicidal or hemocidal ideation.  Labs on Admission: I have personally reviewed following labs and imaging studies  CBC: Recent Labs  Lab 11/26/19 1151  WBC 6.9  HGB 12.2  HCT 36.1  MCV 89.6  PLT 237   Basic Metabolic Panel: Recent Labs  Lab 11/26/19 1151 11/26/19 1930  NA 128* 128*  K 5.3* 4.2  CL 97* 97*  CO2 25 22  GLUCOSE 106* 211*  BUN 18 18  CREATININE 1.12* 0.91  CALCIUM 9.2 9.3   GFR: Estimated Creatinine Clearance: 35.7 mL/min (by C-G formula based on SCr of 0.91 mg/dL). Liver Function Tests: No results for input(s): AST, ALT, ALKPHOS, BILITOT, PROT, ALBUMIN in the last 168 hours. No results for input(s): LIPASE, AMYLASE in the last 168 hours. No results for input(s): AMMONIA in the last 168 hours. Coagulation Profile: Recent Labs  Lab 11/26/19 2130  INR 0.9   Cardiac Enzymes: No results for  input(s): CKTOTAL, CKMB, CKMBINDEX, TROPONINI in the last 168 hours. BNP (last 3 results) No results for input(s): PROBNP in the last 8760 hours. HbA1C: No results for input(s): HGBA1C in the last 72 hours. CBG: No results for input(s): GLUCAP in the last 168 hours. Lipid Profile: No results for input(s): CHOL, HDL, LDLCALC, TRIG, CHOLHDL, LDLDIRECT in the last 72 hours. Thyroid Function Tests: No results for input(s): TSH, T4TOTAL, FREET4, T3FREE, THYROIDAB in the last 72 hours. Anemia Panel: No results for input(s): VITAMINB12, FOLATE, FERRITIN, TIBC, IRON, RETICCTPCT in the last 72 hours. Urine analysis:    Component Value Date/Time   COLORURINE AMBER (A) 11/26/2019 1151   APPEARANCEUR CLEAR (A) 11/26/2019 1151   APPEARANCEUR Clear 11/20/2017 0854   LABSPEC 1.018 11/26/2019 1151   PHURINE 6.0 11/26/2019 1151   GLUCOSEU NEGATIVE 11/26/2019 1151   HGBUR NEGATIVE 11/26/2019 1151   BILIRUBINUR NEGATIVE 11/26/2019 1151   BILIRUBINUR Negative 11/20/2017 0854   KETONESUR NEGATIVE 11/26/2019 1151   PROTEINUR NEGATIVE 11/26/2019 1151   NITRITE NEGATIVE 11/26/2019 1151   LEUKOCYTESUR NEGATIVE 11/26/2019 1151   Sepsis Labs: @LABRCNTIP (procalcitonin:4,lacticidven:4) ) Recent Results (from the past 240 hour(s))  MRSA PCR Screening     Status: None   Collection Time: 11/26/19 11:45 PM   Specimen: Nasopharyngeal  Result Value Ref Range Status   MRSA by PCR NEGATIVE NEGATIVE Final    Comment:        The GeneXpert MRSA Assay (FDA approved for NASAL specimens only), is one component of a comprehensive MRSA colonization surveillance program. It is not intended to diagnose MRSA infection nor to guide or monitor treatment for MRSA infections. Performed at [facility], [address] [place], [place]      Radiological Exams on Admission: No results found.   EKG: Independently reviewed.     Assessment/Plan Principal Problem:   Elevated troponin Active  Problems:   Benign essential HTN   Hyponatremia   Prediabetes  Masiya Claassen is a 86 y.o. female with medical history significant of HTN, prediabetes, h/o CVA with no residual deficit, presented with labile BP and irregular heat beat, found to have A-fib  A-fib Not clear if h/o A-fib Initially RVR, now  rate controled.  CHA2DS2-VASc score at least 6 Start heparin drip per cardiology Home on atenolol  Elevated troponin Troponin 41-> 165-> 257-> 218 Likely demanding due to A-fib with RVR and uncontrolled hypertension Will check echo for wall motion Cardiology consult  HTN Pt has labile BP at home On atenolol, hydralazine, lisinopril and spironolactone Will monitor BP  Prediabetes Check HbA1c  H/o CVA On aspirin and Plavix, hold Plavix while on heparin drip  Hyponatremia Likely due to dehydration IVF  ED Course: initially HR 129, later in 80s, EKG showed atrial fibrillation. BP 121/79-155/61. Na 128, Cl 97, trop 41 -> 165. Pt was admit for elevated troponin.   DVT ppx: Heparin         Code Status: DNR Family Communication: None at bed side.        Disposition Plan:  Anticipate discharge back to previous SNF environment Consults called:   Admission status: tele  Inpatient          Date of Service 11/26/2019    [facility]   If 7PM-7AM, please contact night-coverage [email or web address] Password [email or web address]

## 2019-11-26 NOTE — ED Notes (Signed)
Triplett, FNP notified of troponin of 165.

## 2019-11-26 NOTE — ED Notes (Addendum)
Called son [name] and gave update and current plan of care. Son concerned about BP, states during her last visit her BP was treated but she then became hypotensive and is very sensitive to BP medicine, he is also concerned that she will go without food, and he is concerned that he cannot see her even though the governor said hospitals can have two visitors. Son educated on admission process, pharmacy and MD medication review, current pt condition (pt in bed eating without distress), and visitation policy for the ED and the inpatient floors. Son states he is the legal POA and would like updates on any changes when possible. Reason for communication delay given to son. Son verbalizes understanding of education and states he is going to drive back home to [place] for the night.

## 2019-11-26 NOTE — ED Notes (Signed)
Pt states "I feel much better after the soda."

## 2019-11-26 NOTE — Consult Note (Signed)
ANTICOAGULATION CONSULT NOTE - Initial Consult  Pharmacy Consult for Heparin Infusion Indication: atrial fibrillation  Allergies  Allergen Reactions  . Mushroom Extract Complex   . Penicillins     Has patient had a PCN reaction causing immediate rash, facial/tongue/throat swelling, SOB or lightheadedness with hypotension: Yes Has patient had a PCN reaction causing severe rash involving mucus membranes or skin necrosis: No Has patient had a PCN reaction that required hospitalization No Has patient had a PCN reaction occurring within the last 10 years: Yes If all of the above answers are "NO", then may proceed with Cephalosporin use.   . Aloe Hives and Rash  . Amlodipine Hives, Rash and Hypertension  . Cephalexin Rash  . Metoprolol Hives, Palpitations and Hypertension  . Miconazole Swelling and Rash  . Neosporin [Neomycin-Bacitracin Zn-Polymyx] Rash  . Trandolapril-Verapamil Hcl Er Rash    Patient Measurements: Height: 5\' 1"  (154.9 cm) Weight: 130 lb (59 kg) IBW/kg (Calculated) : 47.8 Heparin Dosing Weight: 59 kg  Vital Signs: Temp: 98 F (36.7 C) (03/12 1527) Temp Source: Oral (03/12 1527) BP: 154/93 (03/12 1821) Pulse Rate: 89 (03/12 1821)  Labs: Recent Labs    11/26/19 1151 11/26/19 1602 11/26/19 1930  HGB 12.2  --   --   HCT 36.1  --   --   PLT 237  --   --   CREATININE 1.12*  --  0.91  TROPONINIHS 41* 165* 257*    Estimated Creatinine Clearance: 37.3 mL/min (by C-G formula based on SCr of 0.91 mg/dL).   Medical History: Past Medical History:  Diagnosis Date  . Bilateral shoulder pain   . Colon polyp   . Constipation   . GERD (gastroesophageal reflux disease)   . Hypertension   . Impaired fasting glucose   . Syncope 03/2016   with [facility]  . Varicose veins of both lower extremities     Medications:  No anticoagulation prior to admission  Assessment: Patient is an 86 y/o F with medical history including hypertension, CVA/TIA, venous insufficiency  admitted with new-onset atrial fibrillation. Pharmacy has been consulted to initiate heparin infusion.  Baseline H&H, platelets WNL. Baseline coags pending.   Goal of Therapy:  Heparin level 0.3-0.7 units/ml Monitor platelets by anticoagulation protocol: Yes   Plan:  -Heparin 2500 units IV bolus x 1 followed by maintenance infusion at 800 units/hr -Heparin level in 8 hours -Daily CBC per protocol  83  Pharmacy Resident 11/26/2019,9:08 PM

## 2019-11-26 NOTE — ED Provider Notes (Signed)
[facility] Emergency Department Provider Note ____________________________________________   First MD Initiated Contact with Patient 11/26/19 1540     (approximate)  I have reviewed the triage vital signs and the nursing notes.   HISTORY  Chief Complaint Weakness and Irregular Heart Beat  HPI Debbie Hampton is a 86 y.o. female with a history of hyperlipidemia, hypertension, CVA, TIA, and venous insufficiency presenting to the emergency department for treatment and evaluation of weakness, headache, low blood pressure, elevated heart rate.      Past Medical History:  Diagnosis Date  . Bilateral shoulder pain   . Colon polyp   . Constipation   . GERD (gastroesophageal reflux disease)   . Hypertension   . Impaired fasting glucose   . Syncope 03/2016   with [facility]  . Varicose veins of both lower extremities     Patient Active Problem List   Diagnosis Date Noted  . Labile blood pressure   . Benign essential HTN   . Hypertensive crisis   . Hyponatremia   . Prediabetes   . Hypoalbuminemia due to protein-calorie malnutrition (HCC)   . Acute blood loss anemia   . 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 Acute CVA (cerebrovascular accident) (HCC) 07/29/2017  . Syncope, near 03/31/2016    Past Surgical History:  Procedure Laterality Date  . TONSILLECTOMY    . TUMOR REMOVAL     ovaries    Prior to Admission medications   Medication Sig Start Date End Date Taking? Authorizing Provider  acetaminophen (TYLENOL) 500 MG tablet Take 500 mg by mouth every 8 (eight) hours as needed.   Yes [provider]  aspirin EC 81 MG EC tablet Take 1 tablet (81 mg total) daily by mouth. 08/01/17  Yes Mody, Sital, MD  atenolol (TENORMIN) 50 MG tablet Take 1 tablet (50 mg total) by mouth daily. 08/23/18  Yes Adrian Saran, MD  Cholecalciferol (VITAMIN D) 2000 units tablet Take 2,000 Units by mouth daily.    Yes [provider]  citalopram (CELEXA) 20 MG tablet  Take 20 mg by mouth daily.    Yes [provider]  clobetasol cream (TEMOVATE) 0.05 % Apply 1 application topically 2 (two) times daily as needed (rash or itching). (apply to feet, ankles, arms, back, buttocks and perineal area)   Yes [provider]  clopidogrel (PLAVIX) 75 MG tablet Take 1 tablet (75 mg total) by mouth daily. 08/13/17  Yes Love, Evlyn Kanner, PA-C  hydrALAZINE (APRESOLINE) 50 MG tablet Take 1 tablet (50 mg total) by mouth 3 (three) times daily. 08/13/17  Yes Love, Evlyn Kanner, PA-C  iron polysaccharides (POLY-IRON 150) 150 MG capsule Take 150 mg by mouth daily.   Yes [provider]  lisinopril (PRINIVIL,ZESTRIL) 40 MG tablet Take 1 tablet (40 mg total) by mouth daily. 08/13/17  Yes Love, Evlyn Kanner, PA-C  Multiple Vitamins-Minerals (CERTAVITE/ANTIOXIDANTS) TABS Take 1 tablet by mouth daily.   Yes [provider]  omega-3 acid ethyl esters (LOVAZA) 1 g capsule Take 1 g by mouth 2 (two) times daily.   Yes [provider]  pantoprazole (PROTONIX) 40 MG tablet Take 1 tablet (40 mg total) by mouth daily. 08/14/17  Yes Love, Evlyn Kanner, PA-C  Polyethyl Glycol-Propyl Glycol (SYSTANE ULTRA) 0.4-0.3 % SOLN Apply 1 drop to eye 3 (three) times daily.   Yes [provider]  polyethylene glycol (MIRALAX / GLYCOLAX) packet Take 17 g by mouth daily. Patient taking differently: Take 17 g by mouth daily.  08/14/17  Yes Love, Evlyn Kanner, PA-C  potassium chloride (K-DUR,KLOR-CON) 10 MEQ tablet Take 1 tablet (10 mEq total) by mouth 2 (two) times daily. 08/13/17  Yes Love, Evlyn Kanner, PA-C  rosuvastatin (CRESTOR) 20 MG tablet Take 20 mg by mouth at bedtime.   Yes [provider]  sodium chloride (OCEAN) 0.65 % SOLN nasal spray Place 1 spray into both nostrils 3 (three) times daily as needed for congestion.   Yes [provider]  spironolactone (ALDACTONE) 25 MG tablet Take 1 tablet (25 mg total) by mouth daily. 08/24/18  Yes Adrian Saran, MD     Allergies Mushroom extract complex, Penicillins, Aloe, Amlodipine, Cephalexin, Metoprolol, Miconazole, Neosporin [neomycin-bacitracin zn-polymyx], and Trandolapril-verapamil hcl er  Family History  Problem Relation Age of Onset  . Hypertension Other   . Breast cancer Sister   . Kidney cancer Neg Hx   . Kidney disease Neg Hx   . Prostate cancer Neg Hx     Social History Social History   Tobacco Use  . Smoking status: Former Smoker    Types: Cigarettes  . Smokeless tobacco: Never Used  . Tobacco comment: quit 1985  Substance Use Topics  . Alcohol use: No  . Drug use: No    Review of Systems  Constitutional: No fever/chills Eyes: No visual changes. ENT: No sore throat. Cardiovascular: Denies chest pain. Positive for chest pressure earlier. Respiratory: Denies shortness of breath. Gastrointestinal: No abdominal pain.  No nausea, no vomiting.  No diarrhea.  No constipation. Genitourinary: Negative for dysuria. Musculoskeletal: Negative for back pain. Skin: Negative for rash. Neurological: Negative for headaches, focal weakness or numbness. ____________________________________________   PHYSICAL EXAM:  VITAL SIGNS: ED Triage Vitals  Enc Vitals Group     BP 11/26/19 1156 121/79     Pulse Rate 11/26/19 1156 (!) 129     Resp 11/26/19 1156 18     Temp 11/26/19 1156 98 F (36.7 C)     Temp Source 11/26/19 1156 Oral     SpO2 11/26/19 1156 95 %     Weight 11/26/19 1147 130 lb (59 kg)     Height 11/26/19 1147 5\' 1"  (1.549 m)     Head Circumference --      Peak Flow --      Pain Score 11/26/19 1147 0     Pain Loc --      Pain Edu? --      Excl. in GC? --     Constitutional: Alert and oriented. Well appearing and in no acute distress. Eyes: Conjunctivae are normal. Head: Atraumatic. Nose: No congestion/rhinnorhea. Mouth/Throat: Mucous membranes are moist. Oropharynx non-erythematous. Neck: No stridor.   Hematological/Lymphatic/Immunilogical: No cervical  lymphadenopathy. Cardiovascular: Normal rate, regular rhythm. Grossly normal heart sounds.  Good peripheral circulation. Respiratory: Normal respiratory effort.  No retractions. Lungs CTAB. Gastrointestinal: Soft and nontender. No distention. No abdominal bruits. No CVA tenderness. Genitourinary:  Musculoskeletal: No lower extremity tenderness nor edema.  No joint effusions. Neurologic:  Normal speech and language. No gross focal neurologic deficits are appreciated. No gait instability. Skin:  Skin is warm, dry and intact. No rash noted. Psychiatric: Mood and affect are normal. Speech and behavior are normal.  ____________________________________________   LABS (all labs ordered are listed, but only abnormal results are displayed)  Labs Reviewed  BASIC METABOLIC PANEL - Abnormal; Notable for the following components:      Result Value   Sodium 128 (*)    Potassium 5.3 (*)    Chloride 97 (*)  Glucose, Bld 106 (*)    Creatinine, Ser 1.12 (*)    GFR calc non Af Amer 45 (*)    GFR calc Af Amer 52 (*)    All other components within normal limits  URINALYSIS, COMPLETE (UACMP) WITH MICROSCOPIC - Abnormal; Notable for the following components:   Color, Urine AMBER (*)    APPearance CLEAR (*)    All other components within normal limits  TROPONIN I (HIGH SENSITIVITY) - Abnormal; Notable for the following components:   Troponin I (High Sensitivity) 41 (*)    All other components within normal limits  TROPONIN I (HIGH SENSITIVITY) - Abnormal; Notable for the following components:   Troponin I (High Sensitivity) 165 (*)    All other components within normal limits  SARS CORONAVIRUS 2 (TAT 6-24 HRS)  CBC  BASIC METABOLIC PANEL  CBG MONITORING, ED  TROPONIN I (HIGH SENSITIVITY)   ____________________________________________  EKG  ED ECG REPORT I, Bonniejean Piano, FNP-BC personally viewed and interpreted this ECG.   Date: 11/26/2019  EKG Time: 1155  Rate: 129  Rhythm: sinus  tachycardia  Axis: normal  Intervals:none  ST&T Change: no ST elevation  ED ECG REPORT I, Sherrie George, FNP-BC personally viewed and interpreted this ECG.   Date: 11/26/2019  Rate: 94  Rhythm: atrial fibrillation  Axis: normal  Intervals:none  ST&T Change: no ST elevation  ____________________________________________  RADIOLOGY  Official radiology report(s): No results found.  ____________________________________________   PROCEDURES  Procedure(s) performed (including Critical Care):  Procedures  ____________________________________________   INITIAL IMPRESSION / ASSESSMENT AND PLAN     86 year old female presenting to the emergency department for evaluation of blood pressure fluctuation.  She also reports some intermittent chest pressure today but none currently.  She states that she has felt like her heart rate sometimes is irregular as well.  She denies shortness of breath.  She has had no cough, nausea, vomiting, diarrhea, or other concerns.  DIFFERENTIAL DIAGNOSIS  Cardiac event, CVA, TIA  ED COURSE  While awaiting ER room assignment, initial lab studies were drawn.  Initial troponin is 41.  Plan will be to repeat troponin and EKG.  Patient is currently denying any chest pain or pressure.  Second troponin elevated to 165.  She is also noted to have a mild hyponatremia at 128, mild hyperkalemia at 5.3.  The plan will be to get the patient admitted.  Repeat EKG does show a atrial fibrillation.  It does not appear that she has a history of A. fib.  She is currently taking an anticoagulant but that is Plavix.   Patient accepted for admission by hospitalist service. ____________________________________________   FINAL CLINICAL IMPRESSION(S) / ED DIAGNOSES  Final diagnoses:  Atrial fibrillation, unspecified type (HCC)  Elevated troponin  Hypertension, unspecified type     ED Discharge Orders    None       Debbie Hampton was evaluated in Emergency  Department on 11/26/2019 for the symptoms described in the history of present illness. She was evaluated in the context of the global COVID-19 pandemic, which necessitated consideration that the patient might be at risk for infection with the SARS-CoV-2 virus that causes COVID-19. Institutional protocols and algorithms that pertain to the evaluation of patients at risk for COVID-19 are in a state of rapid change based on information released by regulatory bodies including the CDC and federal and state organizations. These policies and algorithms were followed during the patient's care in the ED.   Note:  This document was  prepared using Conservation officer, historic buildings and may include unintentional dictation errors.   Chinita Pester, FNP 11/26/19 [place]    Sharyn Creamer, MD 11/26/19 2354

## 2019-11-26 NOTE — ED Notes (Signed)
Assisted pt to the toilet. x1 assist.

## 2019-11-26 NOTE — ED Notes (Signed)
Pt provided soda per her request- educated on NPO status. Pt verbalizes understanding.

## 2019-11-26 NOTE — ED Triage Notes (Signed)
Ems was called to the facility due to pt not feeling well weakness headache, low bp and elevated heart rate, denies hx of irreg heart rate

## 2019-11-26 NOTE — ED Notes (Signed)
Pt assisted to restroom via wheelchair. Returned to bed at this time

## 2019-11-27 DIAGNOSIS — I4891 Unspecified atrial fibrillation: Principal | ICD-10-CM

## 2019-11-27 DIAGNOSIS — Z8679 Personal history of other diseases of the circulatory system: Secondary | ICD-10-CM

## 2019-11-27 DIAGNOSIS — I10 Essential (primary) hypertension: Secondary | ICD-10-CM

## 2019-11-27 DIAGNOSIS — R778 Other specified abnormalities of plasma proteins: Secondary | ICD-10-CM

## 2019-11-27 LAB — BASIC METABOLIC PANEL
Anion gap: 7 (ref 5–15)
BUN: 20 mg/dL (ref 8–23)
CO2: 24 mmol/L (ref 22–32)
Calcium: 8.6 mg/dL — ABNORMAL LOW (ref 8.9–10.3)
Chloride: 97 mmol/L — ABNORMAL LOW (ref 98–111)
Creatinine, Ser: 0.85 mg/dL (ref 0.44–1.00)
GFR calc Af Amer: >60 mL/min (ref >60)
GFR calc non Af Amer: >60 mL/min (ref >60)
Glucose, Bld: 117 mg/dL — ABNORMAL HIGH (ref 70–99)
Potassium: 4.3 mmol/L (ref 3.5–5.1)
Sodium: 128 mmol/L — ABNORMAL LOW (ref 135–145)

## 2019-11-27 LAB — CBC
HCT: 31.5 % — ABNORMAL LOW (ref 36.0–46.0)
Hemoglobin: 10.5 g/dL — ABNORMAL LOW (ref 12.0–15.0)
MCH: 29.8 pg (ref 26.0–34.0)
MCHC: 33.3 g/dL (ref 30.0–36.0)
MCV: 89.5 fL (ref 80.0–100.0)
Platelets: 182 10*3/uL (ref 150–400)
RBC: 3.52 MIL/uL — ABNORMAL LOW (ref 3.87–5.11)
RDW: 12.3 % (ref 11.5–15.5)
WBC: 5.6 10*3/uL (ref 4.0–10.5)
nRBC: 0 % (ref 0.0–0.2)

## 2019-11-27 LAB — HEMOGLOBIN A1C
Hgb A1c MFr Bld: 5.6 % (ref 4.8–5.6)
Mean Plasma Glucose: 114.02 mg/dL

## 2019-11-27 LAB — TROPONIN I (HIGH SENSITIVITY): Troponin I (High Sensitivity): 159 ng/L (ref <18)

## 2019-11-27 LAB — MRSA PCR SCREENING: MRSA by PCR: NEGATIVE

## 2019-11-27 LAB — HEPARIN LEVEL (UNFRACTIONATED)
Heparin Unfractionated: 0.76 IU/mL — ABNORMAL HIGH (ref 0.30–0.70)
Heparin Unfractionated: 0.26 IU/mL — ABNORMAL LOW (ref 0.30–0.70)

## 2019-11-27 LAB — SARS CORONAVIRUS 2 (TAT 6-24 HRS): SARS Coronavirus 2: NEGATIVE

## 2019-11-27 MED ORDER — ATENOLOL 50 MG PO TABS
50.0000 mg | ORAL_TABLET | Freq: Every day | ORAL | Status: DC
  Administered 2019-11-27 – 2019-11-28 (×2): 50 mg via ORAL
  Filled 2019-11-27 (×2): qty 1

## 2019-11-27 MED ORDER — OMEGA-3-ACID ETHYL ESTERS 1 G PO CAPS
1.0000 g | ORAL_CAPSULE | Freq: Two times a day (BID) | ORAL | Status: DC
  Administered 2019-11-27 – 2019-11-28 (×3): 1 g via ORAL
  Filled 2019-11-27 (×3): qty 1

## 2019-11-27 MED ORDER — VITAMIN D 25 MCG (1000 UNIT) PO TABS
2000.0000 [IU] | ORAL_TABLET | Freq: Every day | ORAL | Status: DC
  Administered 2019-11-27 – 2019-11-28 (×2): 2000 [IU] via ORAL
  Filled 2019-11-27 (×2): qty 2

## 2019-11-27 MED ORDER — POLYSACCHARIDE IRON COMPLEX 150 MG PO CAPS
150.0000 mg | ORAL_CAPSULE | Freq: Every day | ORAL | Status: DC
  Administered 2019-11-27 – 2019-11-28 (×2): 150 mg via ORAL
  Filled 2019-11-27 (×2): qty 1

## 2019-11-27 MED ORDER — HYDRALAZINE HCL 50 MG PO TABS
50.0000 mg | ORAL_TABLET | Freq: Three times a day (TID) | ORAL | Status: DC
  Administered 2019-11-27 – 2019-11-28 (×4): 50 mg via ORAL
  Filled 2019-11-27 (×6): qty 1

## 2019-11-27 MED ORDER — ADULT MULTIVITAMIN W/MINERALS CH
1.0000 | ORAL_TABLET | Freq: Every day | ORAL | Status: DC
  Administered 2019-11-27 – 2019-11-28 (×2): 1 via ORAL
  Filled 2019-11-27 (×2): qty 1

## 2019-11-27 MED ORDER — ROSUVASTATIN CALCIUM 10 MG PO TABS: 20.0000 mg | ORAL_TABLET | Freq: Every day | ORAL | Status: DC

## 2019-11-27 MED ORDER — SPIRONOLACTONE 25 MG PO TABS
25.0000 mg | ORAL_TABLET | Freq: Every day | ORAL | Status: DC
  Administered 2019-11-27 – 2019-11-28 (×2): 25 mg via ORAL
  Filled 2019-11-27 (×2): qty 1

## 2019-11-27 MED ORDER — SALINE SPRAY 0.65 % NA SOLN
1.0000 | NASAL | Status: DC | PRN
  Administered 2019-11-28: 1 via NASAL
  Filled 2019-11-27: qty 44

## 2019-11-27 MED ORDER — CITALOPRAM HYDROBROMIDE 20 MG PO TABS
20.0000 mg | ORAL_TABLET | Freq: Every day | ORAL | Status: DC
  Administered 2019-11-27 – 2019-11-28 (×2): 20 mg via ORAL
  Filled 2019-11-27 (×2): qty 1

## 2019-11-27 MED ORDER — LISINOPRIL 20 MG PO TABS
40.0000 mg | ORAL_TABLET | Freq: Every day | ORAL | Status: DC
  Administered 2019-11-27 – 2019-11-28 (×2): 40 mg via ORAL
  Filled 2019-11-27 (×2): qty 2

## 2019-11-27 MED ORDER — SODIUM CHLORIDE 0.9 % IV SOLN: INTRAVENOUS | Status: DC

## 2019-11-27 MED ORDER — ACETAMINOPHEN 500 MG PO TABS: 500.0000 mg | ORAL_TABLET | Freq: Three times a day (TID) | ORAL | Status: DC | PRN

## 2019-11-27 MED ORDER — ROSUVASTATIN CALCIUM 10 MG PO TABS
20.0000 mg | ORAL_TABLET | Freq: Every day | ORAL | Status: DC
  Administered 2019-11-27 (×2): 20 mg via ORAL
  Filled 2019-11-27: qty 2
  Filled 2019-11-27 (×2): qty 4

## 2019-11-27 MED ORDER — POTASSIUM CHLORIDE CRYS ER 10 MEQ PO TBCR: 10.0000 meq | EXTENDED_RELEASE_TABLET | Freq: Two times a day (BID) | ORAL | Status: DC

## 2019-11-27 MED ORDER — POTASSIUM CHLORIDE CRYS ER 10 MEQ PO TBCR
10.0000 meq | EXTENDED_RELEASE_TABLET | Freq: Two times a day (BID) | ORAL | Status: DC
  Administered 2019-11-28: 10 meq via ORAL
  Filled 2019-11-27: qty 1

## 2019-11-27 MED ORDER — PANTOPRAZOLE SODIUM 40 MG PO TBEC
40.0000 mg | DELAYED_RELEASE_TABLET | Freq: Every day | ORAL | Status: DC
  Administered 2019-11-27 – 2019-11-28 (×2): 40 mg via ORAL
  Filled 2019-11-27 (×2): qty 1

## 2019-11-27 MED ORDER — POLYETHYLENE GLYCOL 3350 17 G PO PACK
17.0000 g | PACK | Freq: Every day | ORAL | Status: DC
  Administered 2019-11-27 – 2019-11-28 (×2): 17 g via ORAL
  Filled 2019-11-27 (×2): qty 1

## 2019-11-27 MED ORDER — HEPARIN BOLUS VIA INFUSION
900.0000 [IU] | Freq: Once | INTRAVENOUS | Status: AC
  Administered 2019-11-27: 900 [IU] via INTRAVENOUS
  Filled 2019-11-27: qty 900

## 2019-11-27 MED ORDER — ASPIRIN EC 81 MG PO TBEC
81.0000 mg | DELAYED_RELEASE_TABLET | Freq: Every day | ORAL | Status: DC
  Administered 2019-11-27 – 2019-11-28 (×2): 81 mg via ORAL
  Filled 2019-11-27 (×2): qty 1

## 2019-11-27 NOTE — Progress Notes (Signed)
Pt ask for nasal spray. RN will order saline per standing order set. I will continue to assess.

## 2019-11-27 NOTE — Consult Note (Addendum)
Cardiology Consultation:   [name] MRN: [id number]; DOB: 1931/12/28  Admit date: 11/26/2019 Date of Consult: 11/27/2019  Primary Care Provider: System, Pcp Not In Primary Cardiologist: [facility]. [place] rounding Primary Electrophysiologist:  None    Patient Profile:   Debbie Hampton is a 86 y.o. female with a hx of hypertension, CVA who is being seen today for the evaluation of atrial fibrillation at the request of Dr. Charise Carwin  History of Present Illness:   Ms. Debbie Hampton is an 86 year old female with history of hypertension, GERD, CVA/stroke in 2016 who presents due to irregular heartbeats and low blood pressure.  Patient lives in an assisted living facility.  She states having a stroke in 2016 which caused difficulty in moving her left shoulder.  In an assisted living facility the past 4 years with physical and occupational therapy management.  She states falling at least 4 times over the past several years.  Her last fall was roughly a year and 1/2 to 2 years ago.  Whenever she makes an abrupt left turn she falls.  She ambulates with a walker.  She was having physical therapy yesterday when her therapist checked her blood pressure and noted that it was low.  She also states her heartbeat was very irregular and she had some difficulty catching her breath.  They contacted the on staff physician at the assisted nursing facility who recommended patient be brought to the emergency room.  She was brought to the ED per EMS.  On arrival, showed atrial flutter with heart rate 129.  Initial troponins were 41, 165, 257, 218, 159.  Patient was started on heparin drip and admitted for further management.   Past Medical History:  Diagnosis Date  . Bilateral shoulder pain   . Colon polyp   . Constipation   . GERD (gastroesophageal reflux disease)   . Hypertension   . Impaired fasting glucose   . Syncope 03/2016   with [facility]  . Varicose veins of both lower extremities     Past  Surgical History:  Procedure Laterality Date  . TONSILLECTOMY    . TUMOR REMOVAL     ovaries     Home Medications:  Prior to Admission medications   Medication Sig Start Date End Date Taking? Authorizing Provider  acetaminophen (TYLENOL) 500 MG tablet Take 500 mg by mouth every 8 (eight) hours as needed.   Yes [provider]  aspirin EC 81 MG EC tablet Take 1 tablet (81 mg total) daily by mouth. 08/01/17  Yes Mody, Sital, MD  atenolol (TENORMIN) 50 MG tablet Take 1 tablet (50 mg total) by mouth daily. 08/23/18  Yes Adrian Saran, MD  Cholecalciferol (VITAMIN D) 2000 units tablet Take 2,000 Units by mouth daily.    Yes [provider]  citalopram (CELEXA) 20 MG tablet Take 20 mg by mouth daily.    Yes [provider]  clobetasol cream (TEMOVATE) 0.05 % Apply 1 application topically 2 (two) times daily as needed (rash or itching). (apply to feet, ankles, arms, back, buttocks and perineal area)   Yes [provider]  clopidogrel (PLAVIX) 75 MG tablet Take 1 tablet (75 mg total) by mouth daily. 08/13/17  Yes Love, Evlyn Kanner, PA-C  hydrALAZINE (APRESOLINE) 50 MG tablet Take 1 tablet (50 mg total) by mouth 3 (three) times daily. 08/13/17  Yes Love, Evlyn Kanner, PA-C  iron polysaccharides (POLY-IRON 150) 150 MG capsule Take 150 mg by mouth daily.   Yes [provider]  lisinopril (  PRINIVIL,ZESTRIL) 40 MG tablet Take 1 tablet (40 mg total) by mouth daily. 08/13/17  Yes Love, Ivan Anchors, PA-C  Multiple Vitamins-Minerals (CERTAVITE/ANTIOXIDANTS) TABS Take 1 tablet by mouth daily.   Yes [provider]  omega-3 acid ethyl esters (LOVAZA) 1 g capsule Take 1 g by mouth 2 (two) times daily.   Yes [provider]  pantoprazole (PROTONIX) 40 MG tablet Take 1 tablet (40 mg total) by mouth daily. 08/14/17  Yes Love, Ivan Anchors, PA-C  Polyethyl Glycol-Propyl Glycol (SYSTANE ULTRA) 0.4-0.3 % SOLN Apply 1 drop to eye 3 (three) times daily.   Yes [provider]  polyethylene glycol (MIRALAX / GLYCOLAX) packet Take 17 g by mouth daily. Patient taking differently: Take 17 g by mouth daily.  08/14/17  Yes Love, Ivan Anchors, PA-C  potassium chloride (K-DUR,KLOR-CON) 10 MEQ tablet Take 1 tablet (10 mEq total) by mouth 2 (two) times daily. 08/13/17  Yes Love, Ivan Anchors, PA-C  rosuvastatin (CRESTOR) 20 MG tablet Take 20 mg by mouth at bedtime.   Yes [provider]  sodium chloride (OCEAN) 0.65 % SOLN nasal spray Place 1 spray into both nostrils 3 (three) times daily as needed for congestion.   Yes [provider]  spironolactone (ALDACTONE) 25 MG tablet Take 1 tablet (25 mg total) by mouth daily. 08/24/18  Yes Bettey Costa, MD    Inpatient Medications: Scheduled Meds: . aspirin EC  81 mg Oral Daily  . atenolol  50 mg Oral Daily  . cholecalciferol  2,000 Units Oral Daily  . citalopram  20 mg Oral Daily  . hydrALAZINE  50 mg Oral TID  . iron polysaccharides  150 mg Oral Daily  . lisinopril  40 mg Oral Daily  . multivitamin with minerals  1 tablet Oral Daily  . omega-3 acid ethyl esters  1 g Oral BID  . pantoprazole  40 mg Oral Daily  . polyethylene glycol  17 g Oral Daily  . [START ON 11/28/2019] potassium chloride  10 mEq Oral BID  . rosuvastatin  20 mg Oral QHS  . spironolactone  25 mg Oral Daily   Continuous Infusions: . heparin 650 Units/hr (11/27/19 0752)   PRN Meds: acetaminophen, alum & mag hydroxide-simeth, ondansetron **OR** ondansetron (ZOFRAN) IV  Allergies:    Allergies  Allergen Reactions  . Mushroom Extract Complex   . Penicillins     Has patient had a PCN reaction causing immediate rash, facial/tongue/throat swelling, SOB or lightheadedness with hypotension: Yes Has patient had a PCN reaction causing severe rash involving mucus membranes or skin necrosis: No Has patient had a PCN reaction that required hospitalization No Has patient had a PCN reaction occurring within the last 10 years: Yes If all of  the above answers are "NO", then may proceed with Cephalosporin use.   . Aloe Hives and Rash  . Amlodipine Hives, Rash and Hypertension  . Cephalexin Rash  . Metoprolol Hives, Palpitations and Hypertension  . Miconazole Swelling and Rash  . Neosporin [Neomycin-Bacitracin Zn-Polymyx] Rash  . Trandolapril-Verapamil Hcl Er Rash    Social History:   Social History   Socioeconomic History  . Marital status: Married    Spouse name: Not on file  . Number of children: Not on file  . Years of education: Not on file  . Highest education level: Not on file  Occupational History  . Not on file  Tobacco Use  . Smoking status: Former Smoker    Types: Cigarettes  . Smokeless tobacco: Never  Used  . Tobacco comment: quit 1985  Substance and Sexual Activity  . Alcohol use: No  . Drug use: No  . Sexual activity: Not on file  Other Topics Concern  . Not on file  Social History Narrative  . Not on file   Social Determinants of Health   Financial Resource Strain:   . Difficulty of Paying Living Expenses:   Food Insecurity:   . Worried About Programme researcher, broadcasting/film/video in the Last Year:   . Barista in the Last Year:   Transportation Needs:   . Freight forwarder (Medical):   Marland Kitchen Lack of Transportation (Non-Medical):   Physical Activity:   . Days of Exercise per Week:   . Minutes of Exercise per Session:   Stress:   . Feeling of Stress :   Social Connections:   . Frequency of Communication with Friends and Family:   . Frequency of Social Gatherings with Friends and Family:   . Attends Religious Services:   . Active Member of Clubs or Organizations:   . Attends Banker Meetings:   Marland Kitchen Marital Status:   Intimate Partner Violence:   . Fear of Current or Ex-Partner:   . Emotionally Abused:   Marland Kitchen Physically Abused:   . Sexually Abused:     Family History:    Family History  Problem Relation Age of Onset  . Hypertension Other   . Breast cancer Sister   . Kidney  cancer Neg Hx   . Kidney disease Neg Hx   . Prostate cancer Neg Hx      ROS:  Please see the history of present illness.   All other ROS reviewed and negative.     Physical Exam/Data:   Vitals:   11/27/19 0308 11/27/19 0442 11/27/19 0731 11/27/19 1109  BP: 137/75 (!) 105/57 (!) 150/71 117/78  Pulse: 87 83 77 82  Resp: 20 20 17 17   Temp: (!) 97.5 F (36.4 C) 98 F (36.7 C) (!) 97.5 F (36.4 C) 97.8 F (36.6 C)  TempSrc: Oral Oral Oral   SpO2: 99% 97% 99% 99%  Weight:      Height:        Intake/Output Summary (Last 24 hours) at 11/27/2019 1255 Last data filed at 11/27/2019 1021 Gross per 24 hour  Intake 343.05 ml  Output 1350 ml  Net -1006.95 ml   Last 3 Weights 11/26/2019 11/26/2019 08/22/2018  Weight (lbs) 132 lb 3.2 oz 130 lb 120 lb  Weight (kg) 59.966 kg 58.968 kg 54.432 kg     Body mass index is 24.18 kg/m.  General:  Well nourished, well developed, in no acute distress HEENT: normal Lymph: no adenopathy Neck: no JVD Endocrine:  No thryomegaly Vascular: No carotid bruits; FA pulses 2+ bilaterally without bruits  Cardiac: Irregular irregular ; no murmur  Lungs:  clear to auscultation bilaterally, no wheezing, rhonchi or rales  Abd: soft, nontender, no hepatomegaly  Ext: no edema Musculoskeletal:  No deformities, BUE and BLE strength normal and equal Skin: warm and dry varicose veins noted Neuro:  CNs 2-12 intact, no focal abnormalities noted Psych:  Normal affect   EKG:  The EKG was personally reviewed and demonstrates: Atrial flutter, heart rate 129 Telemetry:  Telemetry was personally reviewed and demonstrates: Atrial fibrillation, heart rate 106  Relevant CV Studies: TTE 07/2017 Left ventricle: The cavity size was normal. Cavity obliteration  in systole. Systolic function was normal. The estimated ejection  fraction was in the  range of 60% to 65%. Wall motion was normal;  there were no regional wall motion abnormalities. Left  ventricular  diastolic function parameters were normal.  - Mitral valve: Calcified annulus. There was mild regurgitation.  - Left atrium: The atrium was mildly to moderately dilated.  - Right ventricle: Systolic function was normal.  - Pulmonary arteries: Systolic pressure was moderately elevated. PA  peak pressure: 49 mm Hg (S).   Laboratory Data:  High Sensitivity Troponin:   Recent Labs  Lab 11/26/19 1151 11/26/19 1602 11/26/19 1930 11/26/19 2130 11/27/19 0534  TROPONINIHS 41* 165* 257* 218* 159*     Chemistry Recent Labs  Lab 11/26/19 1151 11/26/19 1930 11/27/19 0534  NA 128* 128* 128*  K 5.3* 4.2 4.3  CL 97* 97* 97*  CO2 25 22 24   GLUCOSE 106* 211* 117*  BUN 18 18 20   CREATININE 1.12* 0.91 0.85  CALCIUM 9.2 9.3 8.6*  GFRNONAA 45* 58* >60  GFRAA 52* >60 >60  ANIONGAP 6 9 7     No results for input(s): PROT, ALBUMIN, AST, ALT, ALKPHOS, BILITOT in the last 168 hours. Hematology Recent Labs  Lab 11/26/19 1151 11/27/19 0534  WBC 6.9 5.6  RBC 4.03 3.52*  HGB 12.2 10.5*  HCT 36.1 31.5*  MCV 89.6 89.5  MCH 30.3 29.8  MCHC 33.8 33.3  RDW 12.4 12.3  PLT 237 182   BNPNo results for input(s): BNP, PROBNP in the last 168 hours.  DDimer No results for input(s): DDIMER in the last 168 hours.   Radiology/Studies:  No results found.       Assessment and Plan:   1.  New onset atrial fibrillation -CHA2DS2-VASc score at least 5 -Last echocardiogram showed normal ejection fraction. -Currently in atrial fibrillation -Last heart rate is 82 -Continue PTA atenolol.(Patient has a documented history of allergy with metoprolol) -echocardiogram ordered.  Will review -Long discussion had with patient regarding long-term anticoagulation.  Unstable balance/gait and history of falls, the risk for full anticoagulation outweigh benefits since patient is fall risk.. -Continue heparin drip for now while in-house.   2.  History of hypertension -Blood pressure currently  controlled. -Continue current BP meds.  3.  History of falls, prior CVA, -Recommend PT OT. -Continue PTA aspirin, Plavix, statin.  4. Elevated troponins -Minimally elevated high-sensitivity troponins -No evidence of ischemia on EKG -Likely due to demand ischemia -Work-up not warranted at this time unless echocardiogram shows wall motion abnormalities.   Signed, , MD  11/27/2019 12:55 PM

## 2019-11-27 NOTE — Progress Notes (Signed)
Pt remains chest pain free and denies sob.  Pt did however need to get up and walk in halls twice last night due to leg and foot cramps.  She remains in afib 80's.

## 2019-11-27 NOTE — Progress Notes (Signed)
Pt received to room 256 from ED.  Pt oriented to room and call bell.  Pt denies any pain or sob at this time.  Afib at controlled rate in 80'S.  Vs's stable.

## 2019-11-27 NOTE — Consult Note (Signed)
ANTICOAGULATION CONSULT NOTE - Initial Consult  Pharmacy Consult for Heparin Infusion Indication: atrial fibrillation  Allergies  Allergen Reactions  . Mushroom Extract Complex   . Penicillins     Has patient had a PCN reaction causing immediate rash, facial/tongue/throat swelling, SOB or lightheadedness with hypotension: Yes Has patient had a PCN reaction causing severe rash involving mucus membranes or skin necrosis: No Has patient had a PCN reaction that required hospitalization No Has patient had a PCN reaction occurring within the last 10 years: Yes If all of the above answers are "NO", then may proceed with Cephalosporin use.   . Aloe Hives and Rash  . Amlodipine Hives, Rash and Hypertension  . Cephalexin Rash  . Metoprolol Hives, Palpitations and Hypertension  . Miconazole Swelling and Rash  . Neosporin [Neomycin-Bacitracin Zn-Polymyx] Rash  . Trandolapril-Verapamil Hcl Er Rash    Patient Measurements: Height: 5\' 2"  (157.5 cm) Weight: 132 lb 3.2 oz (60 kg) IBW/kg (Calculated) : 50.1 Heparin Dosing Weight: 59 kg  Vital Signs: Temp: 98 F (36.7 C) (03/13 0442) Temp Source: Oral (03/13 0442) BP: 105/57 (03/13 0442) Pulse Rate: 83 (03/13 0442)  Labs: Recent Labs    11/26/19 1151 11/26/19 1602 11/26/19 1930 11/26/19 2130 11/27/19 0534  HGB 12.2  --   --   --  10.5*  HCT 36.1  --   --   --  31.5*  PLT 237  --   --   --  182  APTT  --   --   --  30  --   LABPROT  --   --   --  12.5  --   INR  --   --   --  0.9  --   HEPARINUNFRC  --   --   --   --  0.76*  CREATININE 1.12*  --  0.91  --  0.85  TROPONINIHS 41*   < > 257* 218* 159*   < > = values in this interval not displayed.    Estimated Creatinine Clearance: 38.3 mL/min (by C-G formula based on SCr of 0.85 mg/dL).   Medical History: Past Medical History:  Diagnosis Date  . Bilateral shoulder pain   . Colon polyp   . Constipation   . GERD (gastroesophageal reflux disease)   . Hypertension   .  Impaired fasting glucose   . Syncope 03/2016   with [facility]  . Varicose veins of both lower extremities     Medications:  No anticoagulation prior to admission  Assessment: Patient is an 86 y/o F with medical history including hypertension, CVA/TIA, venous insufficiency admitted with new-onset atrial fibrillation. Pharmacy has been consulted to initiate heparin infusion.  Baseline H&H, platelets WNL. Baseline coags pending.   Goal of Therapy:  Heparin level 0.3-0.7 units/ml Monitor platelets by anticoagulation protocol: Yes   Plan:  03/13 @ 0500 HL 0.76 supratherapeutic. Per RN no complications bleeding, will decrease rate to 650 units/hr and will recheck HL at 1500, CBC trending down will continue to monitor.  4/13, PharmD, BCPS Clinical Pharmacist 11/27/2019,7:28 AM

## 2019-11-27 NOTE — Progress Notes (Signed)
PROGRESS NOTE    Debbie Hampton  FTD:[id number] DOB: [birth year] DOA: 11/26/2019 PCP: System, Pcp Not In   Brief Narrative:  HPI: Debbie Hampton is a 86 y.o. female with medical history significant of HTN, prediabetes, h/o CVA with no residual deficit, presented to ED with low BP and irregular heat beat.  Pt had an episode of headache at noon and requesting for tylenol. BP was checked by staff and found BP high, later BP was found low, exact number not clear. She was also found to have irregular heart beat. She reports feeling heart beat slow and fast. She felt chest tightness.  Not associated with cough, diaphoresis, nausea, vomit or dizziness.  Her symptoms resolved in ED. she denies recent fever, chills, chest pain, abdominal pain, diarrhea or dysuria.  3/13: Patient seen and examined.  Remains in atrial fibrillation.  Rate controlled.  Prior to arrival atenolol restarted.  Patient apparently has an intolerance to metoprolol.  Patient on heparin infusion.  Seems to be tolerating well.  No bleeding noted.  No chest pain.   Assessment & Plan:   Principal Problem:   Elevated troponin Active Problems:   Benign essential HTN   Hyponatremia   Prediabetes   Atrial fibrillation (HCC)   Hypertension  Atrial fibrillation, new onset No known history of A. fib Initially RVR, now rate controled.  CHA2DS2-VASc score at least 6 Rate controlled on home atenolol Cardiology consult appreciated Plan: Continue home atenolol Per cardiology note patient is a high fall risk this risk of anticoagulation outweighs benefit Continue heparin drip while in house 2D echocardiogram to be reviewed by cardiology  Elevated troponin Troponin 41-> 165-> 257-> 218 Likely demand ischemia No ischemic changes on EKG Will check echo for wall motion Cardiology consult  HTN Pt has labile BP at home On atenolol, hydralazine, lisinopril and spironolactone Will monitor BP  Prediabetes Check HbA1c  H/o  CVA On aspirin and Plavix, hold Plavix while on heparin drip  Hyponatremia Likely due to dehydration IVF   DVT prophylaxis: Heparin GTT Code Status: DNR Family Communication: Spoke to son Anabella Capshaw [phone] via phone on 11/27/2019 Disposition Plan: Anticipate return to home environment once echo reviewed by cardiology and confirmation of controlled heart rate.  Anticipate discharge on 11/28/2019  Consultants:   Cardiology-[facility]  Procedures:   2D echo  Antimicrobials:   None   Subjective: Patient seen and examined No acute events over interval Remains in atrial fibrillation, rate controlled  Objective: Vitals:   11/27/19 0308 11/27/19 0442 11/27/19 0731 11/27/19 1109  BP: 137/75 (!) 105/57 (!) 150/71 117/78  Pulse: 87 83 77 82  Resp: 20 20 17 17   Temp: (!) 97.5 F (36.4 C) 98 F (36.7 C) (!) 97.5 F (36.4 C) 97.8 F (36.6 C)  TempSrc: Oral Oral Oral   SpO2: 99% 97% 99% 99%  Weight:      Height:        Intake/Output Summary (Last 24 hours) at 11/27/2019 1358 Last data filed at 11/27/2019 1021 Gross per 24 hour  Intake 343.05 ml  Output 1350 ml  Net -1006.95 ml   Filed Weights   11/26/19 1147 11/26/19 2110  Weight: 59 kg 60 kg    Examination:  General exam: Appears calm and comfortable  Respiratory system: Clear to auscultation. Respiratory effort normal. Cardiovascular system: S1-S2, regular rate, irregular rhythm, no murmurs gastrointestinal system: Abdomen is nondistended, soft and nontender. No organomegaly or masses felt. Normal bowel sounds heard. Central nervous system: Alert and oriented. No  focal neurological deficits. Extremities: Symmetric 5 x 5 power. Skin: No rashes, lesions or ulcers Psychiatry: Judgement and insight appear normal. Mood & affect appropriate.     Data Reviewed: I have personally reviewed following labs and imaging studies  CBC: Recent Labs  Lab 11/26/19 1151 11/27/19 0534  WBC 6.9 5.6  HGB 12.2 10.5*  HCT  36.1 31.5*  MCV 89.6 89.5  PLT 237 182   Basic Metabolic Panel: Recent Labs  Lab 11/26/19 1151 11/26/19 1930 11/27/19 0534  NA 128* 128* 128*  K 5.3* 4.2 4.3  CL 97* 97* 97*  CO2 25 22 24   GLUCOSE 106* 211* 117*  BUN 18 18 20   CREATININE 1.12* 0.91 0.85  CALCIUM 9.2 9.3 8.6*   GFR: Estimated Creatinine Clearance: 38.3 mL/min (by C-G formula based on SCr of 0.85 mg/dL). Liver Function Tests: No results for input(s): AST, ALT, ALKPHOS, BILITOT, PROT, ALBUMIN in the last 168 hours. No results for input(s): LIPASE, AMYLASE in the last 168 hours. No results for input(s): AMMONIA in the last 168 hours. Coagulation Profile: Recent Labs  Lab 11/26/19 2130  INR 0.9   Cardiac Enzymes: No results for input(s): CKTOTAL, CKMB, CKMBINDEX, TROPONINI in the last 168 hours. BNP (last 3 results) No results for input(s): PROBNP in the last 8760 hours. HbA1C: Recent Labs    11/27/19 0534  HGBA1C 5.6   CBG: No results for input(s): GLUCAP in the last 168 hours. Lipid Profile: No results for input(s): CHOL, HDL, LDLCALC, TRIG, CHOLHDL, LDLDIRECT in the last 72 hours. Thyroid Function Tests: No results for input(s): TSH, T4TOTAL, FREET4, T3FREE, THYROIDAB in the last 72 hours. Anemia Panel: No results for input(s): VITAMINB12, FOLATE, FERRITIN, TIBC, IRON, RETICCTPCT in the last 72 hours. Sepsis Labs: No results for input(s): PROCALCITON, LATICACIDVEN in the last 168 hours.  Recent Results (from the past 240 hour(s))  SARS CORONAVIRUS 2 (TAT 6-24 HRS) Nasopharyngeal Nasopharyngeal Swab     Status: None   Collection Time: 11/26/19  7:10 PM   Specimen: Nasopharyngeal Swab  Result Value Ref Range Status   SARS Coronavirus 2 NEGATIVE NEGATIVE Final    Comment: (NOTE) SARS-CoV-2 target nucleic acids are NOT DETECTED. The SARS-CoV-2 RNA is generally detectable in upper and lower respiratory specimens during the acute phase of infection. Negative results do not preclude SARS-CoV-2  infection, do not rule out co-infections with other pathogens, and should not be used as the sole basis for treatment or other patient management decisions. Negative results must be combined with clinical observations, patient history, and epidemiological information. The expected result is Negative. Fact Sheet for Patients: 11/29/19 Fact Sheet for Healthcare Providers: 01/26/20 This test is not yet approved or cleared by the [employer] FDA and  has been authorized for detection and/or diagnosis of SARS-CoV-2 by FDA under an Emergency Use Authorization (EUA). This EUA will remain  in effect (meaning this test can be used) for the duration of the COVID-19 declaration under Section 56 4(b)(1) of the Act, 21 U.S.C. section 360bbb-3(b)(1), unless the authorization is terminated or revoked sooner. Performed at [facility], [address], [place], [address] [place]   MRSA PCR Screening     Status: None   Collection Time: 11/26/19 11:45 PM   Specimen: Nasopharyngeal  Result Value Ref Range Status   MRSA by PCR NEGATIVE NEGATIVE Final    Comment:        The GeneXpert MRSA Assay (FDA approved for NASAL specimens only), is one component of a  comprehensive MRSA colonization surveillance program. It is not intended to diagnose MRSA infection nor to guide or monitor treatment for MRSA infections. Performed at [facility], [address] [place], [place] [address]          Radiology Studies: No results found.      Scheduled Meds: . aspirin EC  81 mg Oral Daily  . atenolol  50 mg Oral Daily  . cholecalciferol  2,000 Units Oral Daily  . citalopram  20 mg Oral Daily  . hydrALAZINE  50 mg Oral TID  . iron polysaccharides  150 mg Oral Daily  . lisinopril  40 mg Oral Daily  . multivitamin with minerals  1 tablet Oral Daily  . omega-3 acid ethyl esters  1 g Oral BID  . pantoprazole  40 mg  Oral Daily  . polyethylene glycol  17 g Oral Daily  . [START ON 11/28/2019] potassium chloride  10 mEq Oral BID  . rosuvastatin  20 mg Oral QHS  . spironolactone  25 mg Oral Daily   Continuous Infusions: . heparin 650 Units/hr (11/27/19 0752)     LOS: 1 day    Time spent: 35 minutes    Tresa Moore, MD [facility] Pager [phone]  If 7PM-7AM, please contact night-coverage 11/27/2019, 1:58 PM

## 2019-11-27 NOTE — Consult Note (Signed)
ANTICOAGULATION CONSULT NOTE   Pharmacy Consult for Heparin Infusion Indication: atrial fibrillation  Allergies  Allergen Reactions  . Mushroom Extract Complex   . Penicillins     Has patient had a PCN reaction causing immediate rash, facial/tongue/throat swelling, SOB or lightheadedness with hypotension: Yes Has patient had a PCN reaction causing severe rash involving mucus membranes or skin necrosis: No Has patient had a PCN reaction that required hospitalization No Has patient had a PCN reaction occurring within the last 10 years: Yes If all of the above answers are "NO", then may proceed with Cephalosporin use.   . Aloe Hives and Rash  . Amlodipine Hives, Rash and Hypertension  . Cephalexin Rash  . Metoprolol Hives, Palpitations and Hypertension  . Miconazole Swelling and Rash  . Neosporin [Neomycin-Bacitracin Zn-Polymyx] Rash  . Trandolapril-Verapamil Hcl Er Rash    Patient Measurements: Height: 5\' 2"  (157.5 cm) Weight: 132 lb 3.2 oz (60 kg) IBW/kg (Calculated) : 50.1 Heparin Dosing Weight: 59 kg  Vital Signs: Temp: 97.8 F (36.6 C) (03/13 1109) Temp Source: Oral (03/13 0731) BP: 117/78 (03/13 1109) Pulse Rate: 82 (03/13 1109)  Labs: Recent Labs    11/26/19 1151 11/26/19 1602 11/26/19 1930 11/26/19 2130 11/27/19 0534 11/27/19 1509  HGB 12.2  --   --   --  10.5*  --   HCT 36.1  --   --   --  31.5*  --   PLT 237  --   --   --  182  --   APTT  --   --   --  30  --   --   LABPROT  --   --   --  12.5  --   --   INR  --   --   --  0.9  --   --   HEPARINUNFRC  --   --   --   --  0.76* 0.26*  CREATININE 1.12*  --  0.91  --  0.85  --   TROPONINIHS 41*   < > 257* 218* 159*  --    < > = values in this interval not displayed.    Estimated Creatinine Clearance: 38.3 mL/min (by C-G formula based on SCr of 0.85 mg/dL).   Medical History: Past Medical History:  Diagnosis Date  . Bilateral shoulder pain   . Colon polyp   . Constipation   . GERD (gastroesophageal  reflux disease)   . Hypertension   . Impaired fasting glucose   . Syncope 03/2016   with [facility]  . Varicose veins of both lower extremities     Medications:  No anticoagulation prior to admission  Assessment: Patient is an 86 y/o F with medical history including hypertension, CVA/TIA, venous insufficiency admitted with new-onset atrial fibrillation. Pharmacy has been consulted to initiate heparin infusion.  Baseline H&H, platelets WNL. Baseline coags pending.   03/13 @ 0500 HL 0.76 supratherapeutic. Per RN no complications bleeding, will decrease rate to 650 units/hr  Goal of Therapy:  Heparin level 0.3-0.7 units/ml Monitor platelets by anticoagulation protocol: Yes   Plan:   3/13 @ 1509 HL= 0.26.subtherapeutic. Will order bolus of 900 units x 1 and increase drip to 750 units/hr and will recheck HL at 0000, CBC trending down will continue to monitor.   4/13 PharmD Clinical Pharmacist 11/27/2019

## 2019-11-28 LAB — CBC
HCT: 30.5 % — ABNORMAL LOW (ref 36.0–46.0)
Hemoglobin: 10.2 g/dL — ABNORMAL LOW (ref 12.0–15.0)
MCH: 30.1 pg (ref 26.0–34.0)
MCHC: 33.4 g/dL (ref 30.0–36.0)
MCV: 90 fL (ref 80.0–100.0)
Platelets: 200 10*3/uL (ref 150–400)
RBC: 3.39 MIL/uL — ABNORMAL LOW (ref 3.87–5.11)
RDW: 12.2 % (ref 11.5–15.5)
WBC: 6.1 10*3/uL (ref 4.0–10.5)
nRBC: 0 % (ref 0.0–0.2)

## 2019-11-28 LAB — BASIC METABOLIC PANEL
Anion gap: 6 (ref 5–15)
BUN: 15 mg/dL (ref 8–23)
CO2: 25 mmol/L (ref 22–32)
Calcium: 8.9 mg/dL (ref 8.9–10.3)
Chloride: 99 mmol/L (ref 98–111)
Creatinine, Ser: 0.77 mg/dL (ref 0.44–1.00)
GFR calc Af Amer: >60 mL/min (ref >60)
GFR calc non Af Amer: >60 mL/min (ref >60)
Glucose, Bld: 88 mg/dL (ref 70–99)
Potassium: 4.4 mmol/L (ref 3.5–5.1)
Sodium: 130 mmol/L — ABNORMAL LOW (ref 135–145)

## 2019-11-28 LAB — MAGNESIUM: Magnesium: 2.1 mg/dL (ref 1.7–2.4)

## 2019-11-28 LAB — HEPARIN LEVEL (UNFRACTIONATED)
Heparin Unfractionated: 0.51 IU/mL (ref 0.30–0.70)
Heparin Unfractionated: 0.6 IU/mL (ref 0.30–0.70)

## 2019-11-28 NOTE — TOC Initial Note (Signed)
Transition of Care [facility]) - Initial/Assessment Note    Patient Details  Name: Debbie Hampton MRN: [id number] Date of Birth: 07/22/1932  Transition of Care [facility]) CM/SW Contact:    Elliot Gurney [place], [place] Phone Number: 11/28/2019, 1:51 PM  Clinical Narrative:                 Patient is a 86 year old femalewith medical history significant ofHTN, prediabetes, h/o CVA with no residual deficit, presented to ED with low BP and irregular heat beat. Patient to return to [facility] today by EMS transport. This Education officer, museum discussed patient's discharge with her son Zarie Kosiba. Patient receiving additional help at the facility discussed as well as eventual plan for patient to enter into long term care if her condition progresses. It was suggested that patient's son visit the medicare.gov sight to research long term care facilities and their star ratings. List of in home care providers also provided, however it was suggested that he speak with patient 's facility to discuss their policy regarding outside help coming in to assist patient's. Patient' son agreed and was appreciative of the help. Patient and patient's son agree with her return to [facility] by EMS today. Antionette Fairy at [facility] notified of patient's return. Discharge summary and Fl2 faxed to [facility], Antionette Fairy [phone].   Annemarie Sebree, LCSW Clinical Social Work [phone]    Expected Discharge Plan: Assisted Living Barriers to Discharge: No Barriers Identified   Patient Goals and CMS Choice Patient states their goals for this hospitalization and ongoing recovery are:: "I need to get stronger"      Expected Discharge Plan and Services Expected Discharge Plan: Assisted Living     Post Acute Care Choice: (assisted living) Living arrangements for the past 2 months: [place] Expected Discharge Date: 11/28/19                                    Prior Living  Arrangements/Services Living arrangements for the past 2 months: [place] Lives with:: Self Patient language and need for interpreter reviewed:: Yes Do you feel safe going back to the place where you live?: Yes      Need for Family Participation in Patient Care: Yes (Comment) Care giver support system in place?: Yes (comment)   Criminal Activity/Legal Involvement Pertinent to Current Situation/Hospitalization: No - Comment as needed  Activities of Daily Living Home Assistive Devices/Equipment: Wheelchair, Environmental consultant (specify type)(propels self when in wheelchair per patient) ADL Screening (condition at time of admission) Patient's cognitive ability adequate to safely complete daily activities?: Yes Is the patient deaf or have difficulty hearing?: Yes Does the patient have difficulty seeing, even when wearing glasses/contacts?: No Does the patient have difficulty concentrating, remembering, or making decisions?: No Patient able to express need for assistance with ADLs?: Yes Does the patient have difficulty dressing or bathing?: Yes Independently performs ADLs?: No Communication: Independent Dressing (OT): Needs assistance Is this a change from baseline?: Pre-admission baseline Grooming: Needs assistance Is this a change from baseline?: Pre-admission baseline Feeding: Independent Bathing: Needs assistance Is this a change from baseline?: Pre-admission baseline Toileting: Independent In/Out Bed: Needs assistance Is this a change from baseline?: Pre-admission baseline Walks in Home: Independent with device (comment) Is this a change from baseline?: Pre-admission baseline Does the patient have difficulty walking or climbing stairs?: Yes Weakness of Legs: Both Weakness of Arms/Hands: None  Permission  Sought/Granted Permission sought to share information with : Family Supports Permission granted to share information with : Yes, Verbal Permission Granted         Permission granted to share info w Relationship: Manroop Jakubowicz  Permission granted to share info w Contact Information: [phone]  Emotional Assessment Appearance:: Appears stated age Attitude/Demeanor/Rapport: Engaged Affect (typically observed): Accepting, Adaptable Orientation: : Oriented to Self, Oriented to Place, Oriented to  Time Alcohol / Substance Use: Not Applicable Psych Involvement: No (comment)  Admission diagnosis:  Elevated troponin [R77.8] Atrial fibrillation, unspecified type (HCC) [I48.91] Hypertension, unspecified type [I10] Patient Active Problem List   Diagnosis Date Noted  . Atrial fibrillation (HCC)   . Hypertension   . Elevated troponin 11/26/2019  . Labile blood pressure   . Benign essential HTN   . Hypertensive crisis   . Hyponatremia   . Prediabetes   . Hypoalbuminemia due to protein-calorie malnutrition (HCC)   . Acute blood loss anemia   . 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 Acute CVA (cerebrovascular accident) (HCC) 07/29/2017  . Syncope, near 03/31/2016   PCP:  System, Pcp Not In Pharmacy:   [facility] - [place], [place] - [address] [place] [address] Phone: [phone] Fax: [phone]     Social Determinants of Health (SDOH) Interventions    Readmission Risk Interventions Readmission Risk Prevention Plan 08/23/2018  Post Dischage Appt Complete  Medication Screening Complete  Transportation Screening Complete  PCP follow-up Complete  Some recent data might be hidden

## 2019-11-28 NOTE — Consult Note (Signed)
ANTICOAGULATION CONSULT NOTE   Pharmacy Consult for Heparin Infusion Indication: atrial fibrillation  Allergies  Allergen Reactions  . Mushroom Extract Complex   . Penicillins     Has patient had a PCN reaction causing immediate rash, facial/tongue/throat swelling, SOB or lightheadedness with hypotension: Yes Has patient had a PCN reaction causing severe rash involving mucus membranes or skin necrosis: No Has patient had a PCN reaction that required hospitalization No Has patient had a PCN reaction occurring within the last 10 years: Yes If all of the above answers are "NO", then may proceed with Cephalosporin use.   . Aloe Hives and Rash  . Amlodipine Hives, Rash and Hypertension  . Cephalexin Rash  . Metoprolol Hives, Palpitations and Hypertension  . Miconazole Swelling and Rash  . Neosporin [Neomycin-Bacitracin Zn-Polymyx] Rash  . Trandolapril-Verapamil Hcl Er Rash    Patient Measurements: Height: 5\' 2"  (157.5 cm) Weight: 132 lb 3.2 oz (60 kg) IBW/kg (Calculated) : 50.1 Heparin Dosing Weight: 59 kg  Vital Signs: Temp: 98.1 F (36.7 C) (03/13 1922) Temp Source: Oral (03/13 1922) BP: 121/56 (03/13 1922) Pulse Rate: 86 (03/13 1922)  Labs: Recent Labs    11/26/19 1151 11/26/19 1602 11/26/19 1930 11/26/19 2130 11/27/19 0534 11/27/19 1509 11/27/19 2346  HGB 12.2  --   --   --  10.5*  --   --   HCT 36.1  --   --   --  31.5*  --   --   PLT 237  --   --   --  182  --   --   APTT  --   --   --  30  --   --   --   LABPROT  --   --   --  12.5  --   --   --   INR  --   --   --  0.9  --   --   --   HEPARINUNFRC  --   --   --   --  0.76* 0.26* 0.60  CREATININE 1.12*  --  0.91  --  0.85  --   --   TROPONINIHS 41*   < > 257* 218* 159*  --   --    < > = values in this interval not displayed.    Estimated Creatinine Clearance: 38.3 mL/min (by C-G formula based on SCr of 0.85 mg/dL).   Medical History: Past Medical History:  Diagnosis Date  . Bilateral shoulder pain    . Colon polyp   . Constipation   . GERD (gastroesophageal reflux disease)   . Hypertension   . Impaired fasting glucose   . Syncope 03/2016   with [facility]  . Varicose veins of both lower extremities     Medications:  No anticoagulation prior to admission  Assessment: Patient is an 86 y/o F with medical history including hypertension, CVA/TIA, venous insufficiency admitted with new-onset atrial fibrillation. Pharmacy has been consulted to initiate heparin infusion.  Baseline H&H, platelets WNL. Baseline coags pending.   03/13 @ 0500 HL 0.76 supratherapeutic. Per RN no complications bleeding, will decrease rate to 650 units/hr  Goal of Therapy:  Heparin level 0.3-0.7 units/ml Monitor platelets by anticoagulation protocol: Yes   Plan:  03/14 @ 2345 HL 0.60 therapeutic. Will continue current rate and will recheck HL at 0800, CBC trending down will continue to monitor.  4/14, PharmD, BCPS Clinical Pharmacist 11/28/2019

## 2019-11-28 NOTE — Progress Notes (Signed)
Progress Note  Patient Name: Debbie Hampton Date of Encounter: 11/28/2019  Primary Cardiologist:[facility]. [place] rounding  Subjective   Patient states doing okay.  Denies chest pain or shortness of breath.  Inpatient Medications    Scheduled Meds: . aspirin EC  81 mg Oral Daily  . atenolol  50 mg Oral Daily  . cholecalciferol  2,000 Units Oral Daily  . citalopram  20 mg Oral Daily  . hydrALAZINE  50 mg Oral TID  . iron polysaccharides  150 mg Oral Daily  . lisinopril  40 mg Oral Daily  . multivitamin with minerals  1 tablet Oral Daily  . omega-3 acid ethyl esters  1 g Oral BID  . pantoprazole  40 mg Oral Daily  . polyethylene glycol  17 g Oral Daily  . potassium chloride  10 mEq Oral BID  . rosuvastatin  20 mg Oral QHS  . spironolactone  25 mg Oral Daily   Continuous Infusions: . heparin 750 Units/hr (11/28/19 0653)   PRN Meds: acetaminophen, alum & mag hydroxide-simeth, ondansetron **OR** ondansetron (ZOFRAN) IV, sodium chloride   Vital Signs    Vitals:   11/27/19 1922 11/28/19 0419 11/28/19 0758 11/28/19 1135  BP: (!) 121/56 125/70 (!) 150/77 (!) 108/50  Pulse: 86 70 79 (!) 57  Resp: 20 17    Temp: 98.1 F (36.7 C) 97.8 F (36.6 C) 98 F (36.7 C) 97.9 F (36.6 C)  TempSrc: Oral Oral Oral Oral  SpO2: 98% 97% 98% 99%  Weight:  60.5 kg    Height:        Intake/Output Summary (Last 24 hours) at 11/28/2019 1151 Last data filed at 11/28/2019 0950 Gross per 24 hour  Intake 600 ml  Output 2600 ml  Net -2000 ml   Last 3 Weights 11/28/2019 11/26/2019 11/26/2019  Weight (lbs) 133 lb 4.8 oz 132 lb 3.2 oz 130 lb  Weight (kg) 60.464 kg 59.966 kg 58.968 kg      Telemetry    Atrial fibrillation heart rate 87- Personally Reviewed  ECG    No new tracing obtained- Personally Reviewed  Physical Exam   GEN: No acute distress.   Neck: No JVD Cardiac:  Irregular irregular, no murmurs, rubs, or gallops.  Respiratory: Clear to auscultation bilaterally. GI:  Soft, nontender, non-distended  MS: No edema; No deformity. Neuro:  Nonfocal  Psych: Normal affect   Labs    High Sensitivity Troponin:   Recent Labs  Lab 11/26/19 1151 11/26/19 1602 11/26/19 1930 11/26/19 2130 11/27/19 0534  TROPONINIHS 41* 165* 257* 218* 159*      Chemistry Recent Labs  Lab 11/26/19 1930 11/27/19 0534 11/28/19 0805  NA 128* 128* 130*  K 4.2 4.3 4.4  CL 97* 97* 99  CO2 22 24 25   GLUCOSE 211* 117* 88  BUN 18 20 15   CREATININE 0.91 0.85 0.77  CALCIUM 9.3 8.6* 8.9  GFRNONAA 58* >60 >60  GFRAA >60 >60 >60  ANIONGAP 9 7 6      Hematology Recent Labs  Lab 11/26/19 1151 11/27/19 0534 11/28/19 0611  WBC 6.9 5.6 6.1  RBC 4.03 3.52* 3.39*  HGB 12.2 10.5* 10.2*  HCT 36.1 31.5* 30.5*  MCV 89.6 89.5 90.0  MCH 30.3 29.8 30.1  MCHC 33.8 33.3 33.4  RDW 12.4 12.3 12.2  PLT 237 182 200    BNPNo results for input(s): BNP, PROBNP in the last 168 hours.   DDimer No results for input(s): DDIMER in the last 168 hours.  Radiology    No results found.  Cardiac Studies   TTE 07/2017 Left ventricle: The cavity size was normal. Cavity obliteration  in systole. Systolic function was normal. The estimated ejection  fraction was in the range of 60% to 65%. Wall motion was normal;  there were no regional wall motion abnormalities. Left  ventricular diastolic function parameters were normal.  - Mitral valve: Calcified annulus. There was mild regurgitation.  - Left atrium: The atrium was mildly to moderately dilated.  - Right ventricle: Systolic function was normal.  - Pulmonary arteries: Systolic pressure was moderately elevated. PA  peak pressure: 49 mm Hg (S).  Patient Profile     86 y.o. female with history of hypertension, GERD, CVA/stroke in 2016 who presents due to irregular heartbeats, found to have new onset atrial fibrillation.  Assessment & Plan    1.  New onset atrial fibrillation -Currently in atrial fibrillation, heart rate  controlled -CHA2DS2-VASc score at least 5 -Last echocardiogram showed normal ejection fraction. -Continue PTA atenolol 50 mg daily -Long discussion had with patient regarding long-term anticoagulation.  Unstable balance/gait and history of falls, the risk for full anticoagulation outweigh benefits since patient is fall risk. -We will review echocardiogram if performed prior to patient discharge.  If echocardiogram is normal, will recommend continuing current medical management.  2.  History of hypertension -Blood pressure currently controlled. -Continue current BP meds.  3.  History of falls, prior CVA, -Recommend PT OT. -Continue PTA aspirin, statin.      Signed, [name], MD  11/28/2019, 11:51 AM

## 2019-11-28 NOTE — Discharge Summary (Signed)
Physician Discharge Summary  Hollynn Garno WNI:[id number] DOB: 07-22-1932 DOA: 11/26/2019  PCP: System, Pcp Not In  Admit date: 11/26/2019 Discharge date: 11/28/2019  Admitted From: ALF Disposition:  ALF  Recommendations for Outpatient Follow-up:  1. Follow up with PCP in 1 week 2. Follow up with cardiology in 1-2 weeks.  Either request a referral from primary care doctor or call the [facility] cardiology group (number included in discharge packet)  Home Health:NO Equipment/Devices:None Discharge Condition:Stable CODE STATUS:FULL Diet recommendation: Heart Healthy  Brief/Interim Summary: FGH:WEXHB Sharpeis a 86 y.o.femalewith medical history significant ofHTN, prediabetes, h/o CVA with no residual deficit, presented to ED with low BP and irregular heat beat.  Pt had an episode of headache at noon and requesting for tylenol. BP was checked by staff and found BP high, later BP was found low, exact number not clear. She was also found to have irregular heart beat. She reports feeling heart beat slow and fast. She felt chest tightness.Not associated with cough, diaphoresis, nausea, vomit ordizziness. Her symptoms resolved in ED.she denies recent fever, chills, chest pain, abdominal pain, diarrhea or dysuria.  3/13: Patient seen and examined.  Remains in atrial fibrillation.  Rate controlled.  Prior to arrival atenolol restarted.  Patient apparently has an intolerance to metoprolol.  Patient on heparin infusion.  Seems to be tolerating well.  No bleeding noted.  No chest pain.  3/14: Patient seen and examined.  Currently in atrial fibrillation, heart rate controlled.  Continuing on p.o. atenolol.Remains on heparin infusion.  Echocardiogram pending, last echocardiogram showed a normal ejection fraction.  Case discussed with cardiology.  Also discussed with patient and patient's family members.  At this time given the patient's history of balance issues and high risk of falls the risk of  long-term full anticoagulation outweighs the benefits.  Will defer addition of DOAC at this time.  Patient will need to follow-up with cardiology as outpatient.    Discharge Diagnoses:  Principal Problem:   Elevated troponin Active Problems:   Benign essential HTN   Hyponatremia   Prediabetes   Atrial fibrillation (HCC)   Hypertension  Atrial fibrillation, new onset No known history of A. fib.  Patient initially presented rapid ventricular response however has been rate controlled on home dose of metoprolol 50 mg daily.  Although the patient's chads vas score is elevated after long discussions with cardiology, the patient, the patient's family members we feel that the risks of long-term anticoagulation outweigh the benefits as the patient has a long history of balance issues, former fractures, recurrent falls.  Patient is already on dual antiplatelet therapy for CVA risk reduction.  Will recommend that she continue her aspirin and Plavix without the addition of a DOAC.  Echocardiogram pending at the time of discharge.  Patient is hemodynamically stable.  Last echocardiogram showed normal ejection fraction.  At this time no urgent indication for inpatient echocardiogram.  Discussed with son, patient will need to follow-up with outpatient cardiology.  She can either return to her primary care physician and request a referral or phone number for [facility] cardiology group included in discharge instructions.  Patient will discharge on atenolol 50 mg daily for rate control.  Elevated troponin Troponin 41->165->257->218 Likely demand ischemia No ischemic changes on EKG Chest pain-free at time of discharge  HTN Patient is quite labile blood pressures.  She is currently dependent on 4 agents, atenolol, hydralazine, lisinopril, spironolactone.  Difficulty titrate blood pressure regimen as inpatient.  I recommend that blood pressure be checked prior  to administration of any antihypertensives at the  assisted living facility.  I recommend that primary care doctor titrate and monitor patient's blood pressure.  86 years of age with a history of falls would allow for some permissive hypertension to reduce fall risk.  Discussed recommendations with patient's son.  Included in discharge packet.    Discharge Instructions  Discharge Instructions    Diet - low sodium heart healthy   Complete by: As directed    Discharge instructions   Complete by: As directed    You have been found to have atrial fibrillation.  This is an abnormal heart rhythm.  Your previous medication atenolol will help with heart rate control.  Recommended you follow-up with a cardiologist.  I will place office phone number for the [facility] health medical group, a cardiologist that saw you in the hospital.  You can also go to your primary care doctor and rest requested referral to cardiology from them as well.  In terms of your blood pressure regimen, you have rather difficult to control blood pressure requiring 4 agents.  I recommend that your blood pressure regimen be titrated and monitored carefully.  I recommend that blood pressure be checked 20 to 30 minutes prior to administration of blood pressure agents at the assisted living facility.  You may benefit from dose reduction or change in blood pressure to avoid low blood pressure which increases the risk of falling and potentially passing out.   Increase activity slowly   Complete by: As directed      Allergies as of 11/28/2019      Reactions   Mushroom Extract Complex    Penicillins    Has patient had a PCN reaction causing immediate rash, facial/tongue/throat swelling, SOB or lightheadedness with hypotension: Yes Has patient had a PCN reaction causing severe rash involving mucus membranes or skin necrosis: No Has patient had a PCN reaction that required hospitalization No Has patient had a PCN reaction occurring within the last 10 years: Yes If all of the above answers  are "NO", then may proceed with Cephalosporin use.   Aloe Hives, Rash   Amlodipine Hives, Rash, Hypertension   Cephalexin Rash   Metoprolol Hives, Palpitations, Hypertension   Miconazole Swelling, Rash   Neosporin [neomycin-bacitracin Zn-polymyx] Rash   Trandolapril-verapamil Hcl Er Rash      Medication List    TAKE these medications   acetaminophen 500 MG tablet Commonly known as: TYLENOL Take 500 mg by mouth every 8 (eight) hours as needed.   aspirin 81 MG EC tablet Take 1 tablet (81 mg total) daily by mouth.   atenolol 50 MG tablet Commonly known as: TENORMIN Take 1 tablet (50 mg total) by mouth daily.   CertaVite/Antioxidants Tabs Take 1 tablet by mouth daily.   citalopram 20 MG tablet Commonly known as: CELEXA Take 20 mg by mouth daily.   clobetasol cream 0.05 % Commonly known as: TEMOVATE Apply 1 application topically 2 (two) times daily as needed (rash or itching). (apply to feet, ankles, arms, back, buttocks and perineal area)   clopidogrel 75 MG tablet Commonly known as: PLAVIX Take 1 tablet (75 mg total) by mouth daily.   hydrALAZINE 50 MG tablet Commonly known as: APRESOLINE Take 1 tablet (50 mg total) by mouth 3 (three) times daily.   lisinopril 40 MG tablet Commonly known as: ZESTRIL Take 1 tablet (40 mg total) by mouth daily.   omega-3 acid ethyl esters 1 g capsule Commonly known as: LOVAZA Take 1 g  by mouth 2 (two) times daily.   pantoprazole 40 MG tablet Commonly known as: PROTONIX Take 1 tablet (40 mg total) by mouth daily.   Poly-Iron 150 150 MG capsule Generic drug: iron polysaccharides Take 150 mg by mouth daily.   polyethylene glycol 17 g packet Commonly known as: MIRALAX / GLYCOLAX Take 17 g by mouth daily.   potassium chloride 10 MEQ tablet Commonly known as: KLOR-CON Take 1 tablet (10 mEq total) by mouth 2 (two) times daily.   rosuvastatin 20 MG tablet Commonly known as: CRESTOR Take 20 mg by mouth at bedtime.   sodium  chloride 0.65 % Soln nasal spray Commonly known as: OCEAN Place 1 spray into both nostrils 3 (three) times daily as needed for congestion.   spironolactone 25 MG tablet Commonly known as: ALDACTONE Take 1 tablet (25 mg total) by mouth daily.   Systane Ultra 0.4-0.3 % Soln Generic drug: Polyethyl Glycol-Propyl Glycol Apply 1 drop to eye 3 (three) times daily.   Vitamin D 50 MCG (2000 UT) tablet Take 2,000 Units by mouth daily.      Follow-up Information    [facility]. Schedule an appointment as soon as possible for a visit in 1 week(s).   Specialty: Cardiology Why: Cardiology group that saw you while in the hospital.  Please make a followup appointment in 1-2 weeks with this group or request a referral to cardiology from your primary care doctor Contact information: [address], [address] [place] [address] [phone]         Allergies  Allergen Reactions  . Mushroom Extract Complex   . Penicillins     Has patient had a PCN reaction causing immediate rash, facial/tongue/throat swelling, SOB or lightheadedness with hypotension: Yes Has patient had a PCN reaction causing severe rash involving mucus membranes or skin necrosis: No Has patient had a PCN reaction that required hospitalization No Has patient had a PCN reaction occurring within the last 10 years: Yes If all of the above answers are "NO", then may proceed with Cephalosporin use.   . Aloe Hives and Rash  . Amlodipine Hives, Rash and Hypertension  . Cephalexin Rash  . Metoprolol Hives, Palpitations and Hypertension  . Miconazole Swelling and Rash  . Neosporin [Neomycin-Bacitracin Zn-Polymyx] Rash  . Trandolapril-Verapamil Hcl Er Rash    Consultations:  Cardiology- [facility]   Procedures/Studies:  No results found. (Echo, Carotid, EGD, Colonoscopy, ERCP)    Subjective: Seen and examined at the time of discharge No complaints, feels well Medically stable for  discharge home  Discharge Exam: Vitals:   11/28/19 0758 11/28/19 1135  BP: (!) 150/77 (!) 108/50  Pulse: 79 (!) 57  Resp:    Temp: 98 F (36.7 C) 97.9 F (36.6 C)  SpO2: 98% 99%   Vitals:   11/27/19 1922 11/28/19 0419 11/28/19 0758 11/28/19 1135  BP: (!) 121/56 125/70 (!) 150/77 (!) 108/50  Pulse: 86 70 79 (!) 57  Resp: 20 17    Temp: 98.1 F (36.7 C) 97.8 F (36.6 C) 98 F (36.7 C) 97.9 F (36.6 C)  TempSrc: Oral Oral Oral Oral  SpO2: 98% 97% 98% 99%  Weight:  60.5 kg    Height:        General: Pt is alert, awake, not in acute distress Cardiovascular: Regular rate, regular rhythm, no murmurs Respiratory: CTA bilaterally, no wheezing, no rhonchi Abdominal: Soft, NT, ND, bowel sounds + Extremities: no edema, no cyanosis    The results of  significant diagnostics from this hospitalization (including imaging, microbiology, ancillary and laboratory) are listed below for reference.     Microbiology: Recent Results (from the past 240 hour(s))  SARS CORONAVIRUS 2 (TAT 6-24 HRS) Nasopharyngeal Nasopharyngeal Swab     Status: None   Collection Time: 11/26/19  7:10 PM   Specimen: Nasopharyngeal Swab  Result Value Ref Range Status   SARS Coronavirus 2 NEGATIVE NEGATIVE Final    Comment: (NOTE) SARS-CoV-2 target nucleic acids are NOT DETECTED. The SARS-CoV-2 RNA is generally detectable in upper and lower respiratory specimens during the acute phase of infection. Negative results do not preclude SARS-CoV-2 infection, do not rule out co-infections with other pathogens, and should not be used as the sole basis for treatment or other patient management decisions. Negative results must be combined with clinical observations, patient history, and epidemiological information. The expected result is Negative. Fact Sheet for Patients: HairSlick.no Fact Sheet for Healthcare Providers: quierodirigir.com This test is not yet  approved or cleared by the [place] FDA and  has been authorized for detection and/or diagnosis of SARS-CoV-2 by FDA under an Emergency Use Authorization (EUA). This EUA will remain  in effect (meaning this test can be used) for the duration of the COVID-19 declaration under Section 56 4(b)(1) of the Act, 21 U.S.C. section 360bbb-3(b)(1), unless the authorization is terminated or revoked sooner. Performed at [facility], [address], [place], [place] [address]   MRSA PCR Screening     Status: None   Collection Time: 11/26/19 11:45 PM   Specimen: Nasopharyngeal  Result Value Ref Range Status   MRSA by PCR NEGATIVE NEGATIVE Final    Comment:        The GeneXpert MRSA Assay (FDA approved for NASAL specimens only), is one component of a comprehensive MRSA colonization surveillance program. It is not intended to diagnose MRSA infection nor to guide or monitor treatment for MRSA infections. Performed at [facility], [address] [place], [place] [address]      Labs: BNP (last 3 results) No results for input(s): BNP in the last 8760 hours. Basic Metabolic Panel: Recent Labs  Lab 11/26/19 1151 11/26/19 1930 11/27/19 0534 11/28/19 0805  NA 128* 128* 128* 130*  K 5.3* 4.2 4.3 4.4  CL 97* 97* 97* 99  CO2 25 22 24 25   GLUCOSE 106* 211* 117* 88  BUN 18 18 20 15   CREATININE 1.12* 0.91 0.85 0.77  CALCIUM 9.2 9.3 8.6* 8.9  MG  --   --   --  2.1   Liver Function Tests: No results for input(s): AST, ALT, ALKPHOS, BILITOT, PROT, ALBUMIN in the last 168 hours. No results for input(s): LIPASE, AMYLASE in the last 168 hours. No results for input(s): AMMONIA in the last 168 hours. CBC: Recent Labs  Lab 11/26/19 1151 11/27/19 0534 11/28/19 0611  WBC 6.9 5.6 6.1  HGB 12.2 10.5* 10.2*  HCT 36.1 31.5* 30.5*  MCV 89.6 89.5 90.0  PLT 237 182 200   Cardiac Enzymes: No results for input(s): CKTOTAL, CKMB, CKMBINDEX, TROPONINI in the last 168  hours. BNP: Invalid input(s): POCBNP CBG: No results for input(s): GLUCAP in the last 168 hours. D-Dimer No results for input(s): DDIMER in the last 72 hours. Hgb A1c Recent Labs    11/27/19 0534  HGBA1C 5.6   Lipid Profile No results for input(s): CHOL, HDL, LDLCALC, TRIG, CHOLHDL, LDLDIRECT in the last 72 hours. Thyroid function studies No results for input(s): TSH, T4TOTAL, T3FREE, THYROIDAB  in the last 72 hours.  Invalid input(s): FREET3 Anemia work up No results for input(s): VITAMINB12, FOLATE, FERRITIN, TIBC, IRON, RETICCTPCT in the last 72 hours. Urinalysis    Component Value Date/Time   COLORURINE AMBER (A) 11/26/2019 1151   APPEARANCEUR CLEAR (A) 11/26/2019 1151   APPEARANCEUR Clear 11/20/2017 0854   LABSPEC 1.018 11/26/2019 1151   PHURINE 6.0 11/26/2019 1151   GLUCOSEU NEGATIVE 11/26/2019 1151   HGBUR NEGATIVE 11/26/2019 1151   BILIRUBINUR NEGATIVE 11/26/2019 1151   BILIRUBINUR Negative 11/20/2017 0854   KETONESUR NEGATIVE 11/26/2019 1151   PROTEINUR NEGATIVE 11/26/2019 1151   NITRITE NEGATIVE 11/26/2019 1151   LEUKOCYTESUR NEGATIVE 11/26/2019 1151   Sepsis Labs Invalid input(s): PROCALCITONIN,  WBC,  LACTICIDVEN Microbiology Recent Results (from the past 240 hour(s))  SARS CORONAVIRUS 2 (TAT 6-24 HRS) Nasopharyngeal Nasopharyngeal Swab     Status: None   Collection Time: 11/26/19  7:10 PM   Specimen: Nasopharyngeal Swab  Result Value Ref Range Status   SARS Coronavirus 2 NEGATIVE NEGATIVE Final    Comment: (NOTE) SARS-CoV-2 target nucleic acids are NOT DETECTED. The SARS-CoV-2 RNA is generally detectable in upper and lower respiratory specimens during the acute phase of infection. Negative results do not preclude SARS-CoV-2 infection, do not rule out co-infections with other pathogens, and should not be used as the sole basis for treatment or other patient management decisions. Negative results must be combined with clinical observations, patient  history, and epidemiological information. The expected result is Negative. Fact Sheet for Patients: HairSlick.nohttps://www.fda.gov/media/138098/download Fact Sheet for Healthcare Providers: quierodirigir.comhttps://www.fda.gov/media/138095/download This test is not yet approved or cleared by the [place] FDA and  has been authorized for detection and/or diagnosis of SARS-CoV-2 by FDA under an Emergency Use Authorization (EUA). This EUA will remain  in effect (meaning this test can be used) for the duration of the COVID-19 declaration under Section 56 4(b)(1) of the Act, 21 U.S.C. section 360bbb-3(b)(1), unless the authorization is terminated or revoked sooner. Performed at [facility], [address] [place], [place] [address]   MRSA PCR Screening     Status: None   Collection Time: 11/26/19 11:45 PM   Specimen: Nasopharyngeal  Result Value Ref Range Status   MRSA by PCR NEGATIVE NEGATIVE Final    Comment:        The GeneXpert MRSA Assay (FDA approved for NASAL specimens only), is one component of a comprehensive MRSA colonization surveillance program. It is not intended to diagnose MRSA infection nor to guide or monitor treatment for MRSA infections. Performed at [facility], [address] [place], [place] [address]      Time coordinating discharge: Over 30 minutes  SIGNED:   Tresa MooreSudheer B Riddick Nuon, MD  [facility] 11/28/2019, 1:00 PM Pager   If 7PM-7AM, please contact night-coverage

## 2019-11-28 NOTE — Consult Note (Signed)
[facility] for Heparin Infusion Indication: atrial fibrillation  Allergies  Allergen Reactions  . Mushroom Extract Complex   . Penicillins     Has patient had a PCN reaction causing immediate rash, facial/tongue/throat swelling, SOB or lightheadedness with hypotension: Yes Has patient had a PCN reaction causing severe rash involving mucus membranes or skin necrosis: No Has patient had a PCN reaction that required hospitalization No Has patient had a PCN reaction occurring within the last 10 years: Yes If all of the above answers are "NO", then may proceed with Cephalosporin use.   . Aloe Hives and Rash  . Amlodipine Hives, Rash and Hypertension  . Cephalexin Rash  . Metoprolol Hives, Palpitations and Hypertension  . Miconazole Swelling and Rash  . Neosporin [Neomycin-Bacitracin Zn-Polymyx] Rash  . Trandolapril-Verapamil Hcl Er Rash    Patient Measurements: Height: 5\' 2"  (157.5 cm) Weight: 133 lb 4.8 oz (60.5 kg) IBW/kg (Calculated) : 50.1 Heparin Dosing Weight: 59 kg  Vital Signs: Temp: 98 F (36.7 C) (03/14 0758) Temp Source: Oral (03/14 0758) BP: 150/77 (03/14 0758) Pulse Rate: 79 (03/14 0758)  Labs: Recent Labs    11/26/19 1151 11/26/19 1151 11/26/19 1602 11/26/19 1930 11/26/19 2130 11/27/19 0534 11/27/19 0534 11/27/19 1509 11/27/19 2346 11/28/19 0611 11/28/19 0805  HGB 12.2   < >  --   --   --  10.5*  --   --   --  10.2*  --   HCT 36.1  --   --   --   --  31.5*  --   --   --  30.5*  --   PLT 237  --   --   --   --  182  --   --   --  200  --   APTT  --   --   --   --  30  --   --   --   --   --   --   LABPROT  --   --   --   --  12.5  --   --   --   --   --   --   INR  --   --   --   --  0.9  --   --   --   --   --   --   HEPARINUNFRC  --   --   --   --   --  0.76*   < > 0.26* 0.60  --  0.51  CREATININE 1.12*  --    < > 0.91  --  0.85  --   --   --   --  0.77  TROPONINIHS 41*  --    < > 257* 218* 159*  --   --   --    --   --    < > = values in this interval not displayed.    Estimated Creatinine Clearance: 44.1 mL/min (by C-G formula based on SCr of 0.77 mg/dL).   Medical History: Past Medical History:  Diagnosis Date  . Bilateral shoulder pain   . Colon polyp   . Constipation   . GERD (gastroesophageal reflux disease)   . Hypertension   . Impaired fasting glucose   . Syncope 03/2016   with [facility]  . Varicose veins of both lower extremities     Medications:  No anticoagulation prior to admission  Assessment: Patient is an 86 y/o F  with medical history including hypertension, CVA/TIA, venous insufficiency admitted with new-onset atrial fibrillation. Pharmacy has been consulted to initiate heparin infusion.  Baseline H&H, platelets WNL. Baseline coags pending.   03/13 @ 0500 HL 0.76 supratherapeutic. Per RN no complications bleeding, will decrease rate to 650 units/hr 03/13 @ 2345 HL 0.60 therapeutic. Will continue current rate and will recheck HL at 0800,   Goal of Therapy:  Heparin level 0.3-0.7 units/ml Monitor platelets by anticoagulation protocol: Yes   Plan:  03/14 @ 0805 HL 0.51 therapeutic x 2. Will continue current rate and will recheck HLwith am labs.  Bari Mantis PharmD Clinical Pharmacist 11/28/2019

## 2019-11-28 NOTE — [place] FL2 (Signed)
[place] MEDICAID FL2 LEVEL OF CARE SCREENING TOOL     IDENTIFICATION  Patient Name: Debbie Hampton Birthdate: April 23, 1932 Sex: female Admission Date (Current Location): 11/26/2019  [place] and [place] Number:  Chiropodist and Address:  [facility], [address] [place], [place] [address]      Provider Number: [id number]  Attending Physician Name and Address:  Tresa Moore, MD  Relative Name and Phone Number:  Brittany Osier [phone]    Current Level of Care: Hospital Recommended Level of Care: Assisted Living Facility Prior Approval Number:    Date Approved/Denied:   PASRR Number: [id number]  Discharge Plan: Other (Comment)(Assisted Living)    Current Diagnoses: Patient Active Problem List   Diagnosis Date Noted  . Atrial fibrillation (HCC)   . Hypertension   . Elevated troponin 11/26/2019  . Labile blood pressure   . Benign essential HTN   . Hypertensive crisis   . Hyponatremia   . Prediabetes   . Hypoalbuminemia due to protein-calorie malnutrition (HCC)   . Acute blood loss anemia   . 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 Acute CVA (cerebrovascular accident) (HCC) 07/29/2017  . Syncope, near 03/31/2016    Orientation RESPIRATION BLADDER Height & Weight     Time, Self, Situation, Place  Normal Continent Weight: 133 lb 4.8 oz (60.5 kg) Height:  5\' 2"  (157.5 cm)  BEHAVIORAL SYMPTOMS/MOOD NEUROLOGICAL BOWEL NUTRITION STATUS      Incontinent Diet  AMBULATORY STATUS COMMUNICATION OF NEEDS Skin   Limited Assist Verbally Normal                       Personal Care Assistance Level of Assistance  Bathing, Feeding, Dressing Bathing Assistance: Limited assistance Feeding assistance: Independent Dressing Assistance: Limited assistance     Functional Limitations Info  Sight, Hearing, Speech Sight Info: Adequate Hearing Info: Impaired Speech Info: Adequate    SPECIAL CARE FACTORS FREQUENCY                       Contractures Contractures Info: Not present    Additional Factors Info  Code Status Code Status Info: DNR             Current Medications (11/28/2019):  This is the current hospital active medication list Current Facility-Administered Medications  Medication Dose Route Frequency Provider Last Rate Last Admin  . acetaminophen (TYLENOL) tablet 500 mg  500 mg Oral Q8H PRN 11/30/2019, MD      . alum & mag hydroxide-simeth (MAALOX/MYLANTA) 200-200-20 MG/5ML suspension 30 mL  30 mL Oral Q6H PRN 03-25-2001, NP   30 mL at 11/27/19 2011  . aspirin EC tablet 81 mg  81 mg Oral Daily 2012, MD   81 mg at 11/28/19 0945  . atenolol (TENORMIN) tablet 50 mg  50 mg Oral Daily 11/30/19, MD   50 mg at 11/28/19 0945  . cholecalciferol (VITAMIN D3) tablet 2,000 Units  2,000 Units Oral Daily 11/30/19, MD   2,000 Units at 11/28/19 0945  . citalopram (CELEXA) tablet 20 mg  20 mg Oral Daily 11/30/19, MD   20 mg at 11/28/19 0945  . heparin ADULT infusion 100 units/mL (25000 units/273mL sodium chloride 0.45%)  750 Units/hr Intravenous Continuous 45m B, MD 7.5 mL/hr at 11/28/19 0653 750 Units/hr at 11/28/19 0653  . hydrALAZINE (APRESOLINE) tablet 50 mg  50 mg Oral TID 11/30/19, MD   50 mg at 11/28/19  0945  . iron polysaccharides (NIFEREX) capsule 150 mg  150 mg Oral Daily Anda Latina, MD   150 mg at 11/28/19 0944  . lisinopril (ZESTRIL) tablet 40 mg  40 mg Oral Daily Anda Latina, MD   40 mg at 11/28/19 0945  . multivitamin with minerals tablet 1 tablet  1 tablet Oral Daily Anda Latina, MD   1 tablet at 11/28/19 0945  . omega-3 acid ethyl esters (LOVAZA) capsule 1 g  1 g Oral BID Anda Latina, MD   1 g at 11/28/19 0945  . ondansetron (ZOFRAN) tablet 4 mg  4 mg Oral Q6H PRN Anda Latina, MD       Or  . ondansetron [facility]) injection 4 mg  4 mg Intravenous Q6H PRN Anda Latina, MD      . pantoprazole (PROTONIX) EC tablet 40 mg  40 mg Oral Daily Anda Latina, MD   40 mg at 11/28/19 0944  . polyethylene glycol (MIRALAX / GLYCOLAX) packet 17 g  17 g Oral Daily Anda Latina, MD   17 g at 11/28/19 0945  . potassium chloride (KLOR-CON) CR tablet 10 mEq  10 mEq Oral BID Anda Latina, MD   10 mEq at 11/28/19 0945  . rosuvastatin (CRESTOR) tablet 20 mg  20 mg Oral QHS Anda Latina, MD   20 mg at 11/27/19 2013  . sodium chloride (OCEAN) 0.65 % nasal spray 1 spray  1 spray Each Nare PRN Ralene Muskrat B, MD   1 spray at 11/28/19 0945  . spironolactone (ALDACTONE) tablet 25 mg  25 mg Oral Daily Anda Latina, MD   25 mg at 11/28/19 0945     Discharge Medications: Please see discharge summary for a list of discharge medications.  Relevant Imaging Results:  Relevant Lab Results:   Additional Information [phone]  Elliot Gurney [place],

## 2019-12-06 DIAGNOSIS — Z8673 Personal history of transient ischemic attack (TIA), and cerebral infarction without residual deficits: Secondary | ICD-10-CM | POA: Insufficient documentation

## 2019-12-06 DIAGNOSIS — I4891 Unspecified atrial fibrillation: Secondary | ICD-10-CM | POA: Insufficient documentation

## 2019-12-18 IMAGING — CR DG FOOT COMPLETE 3+V*L*
3 series · 3 of 3 positions shown · non-contrast
Comparison: None.

CLINICAL DATA: Fall when ambulating in kitchen with her walker.
Left foot and ankle pain.

EXAM:
LEFT FOOT - COMPLETE 3+ VIEW

[foot ap (1 of 2)]
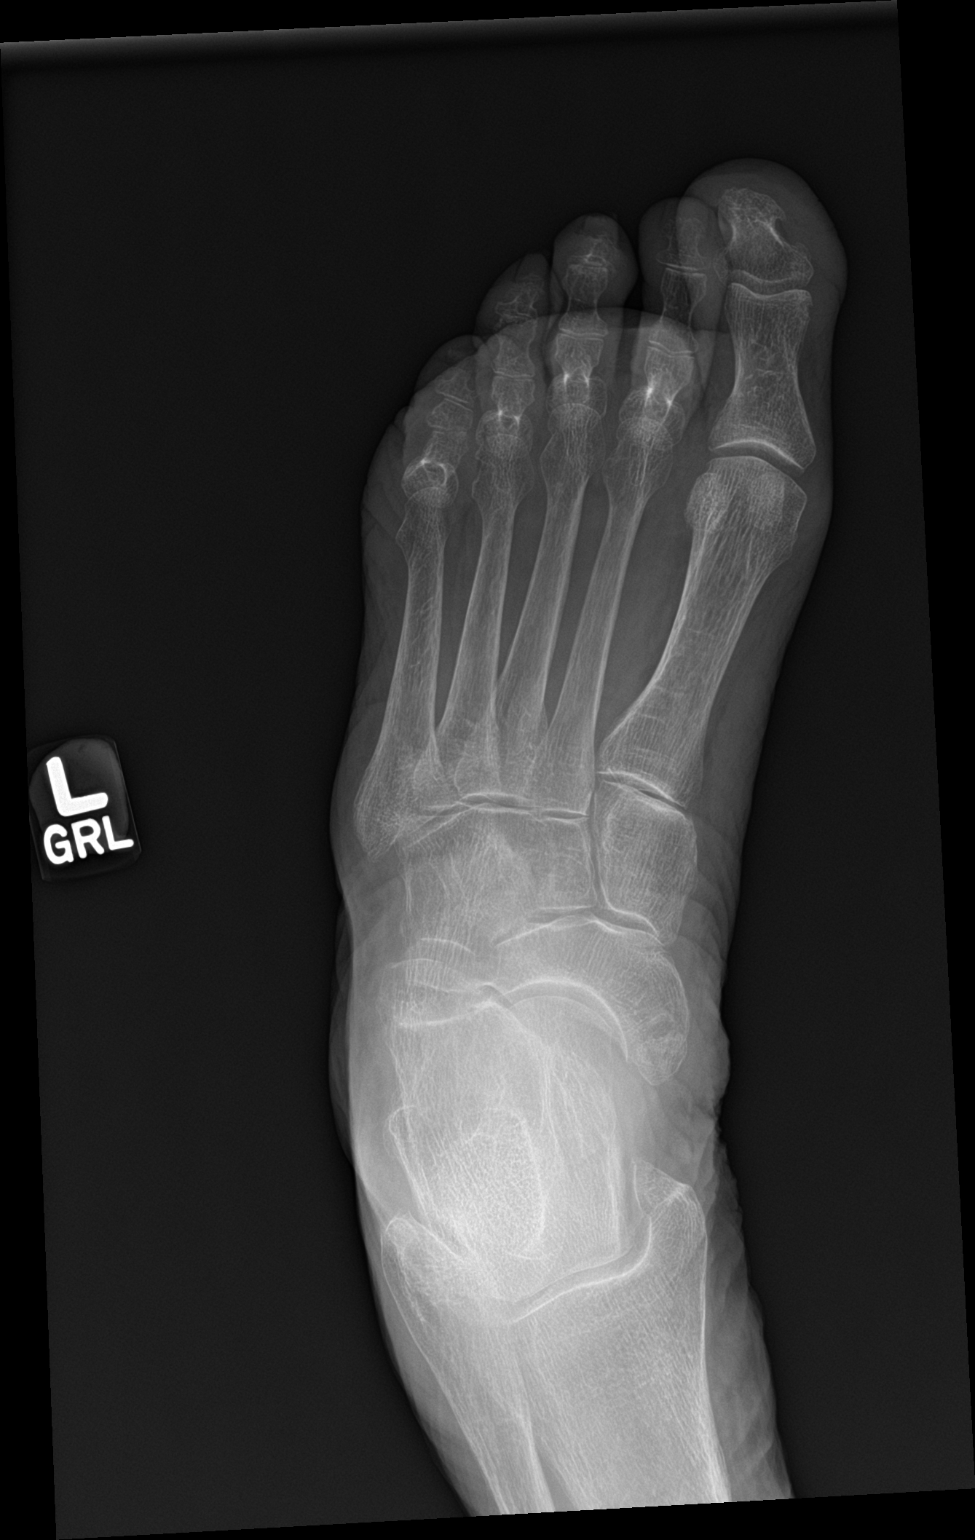

[foot lat]
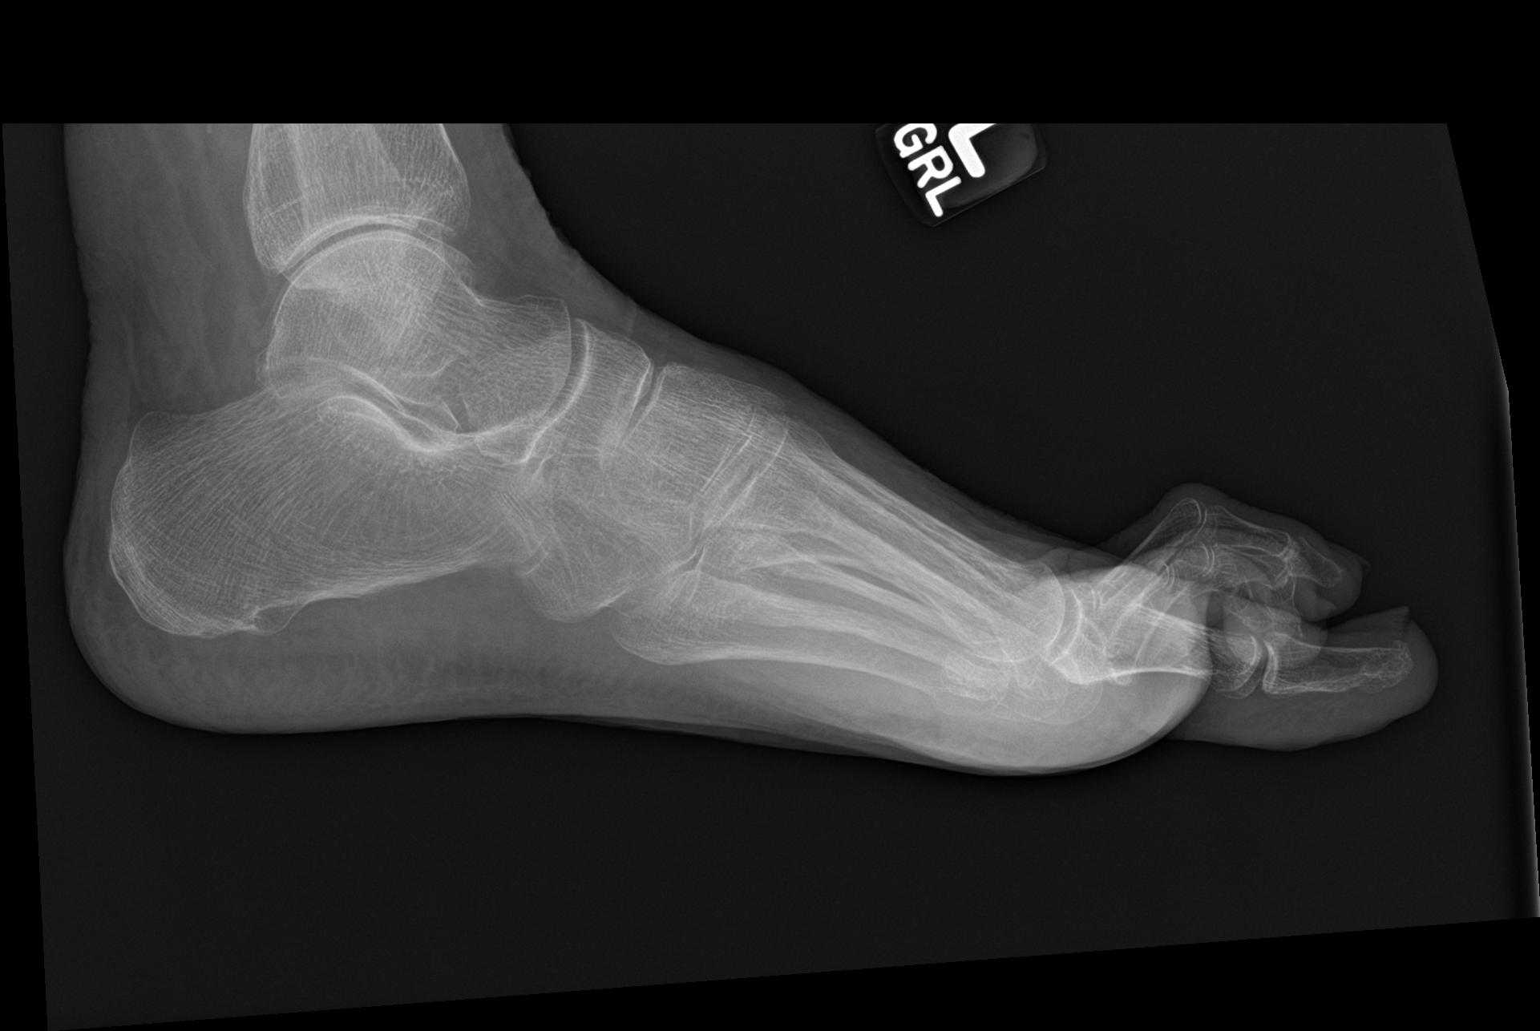

[foot ap (2 of 2)]
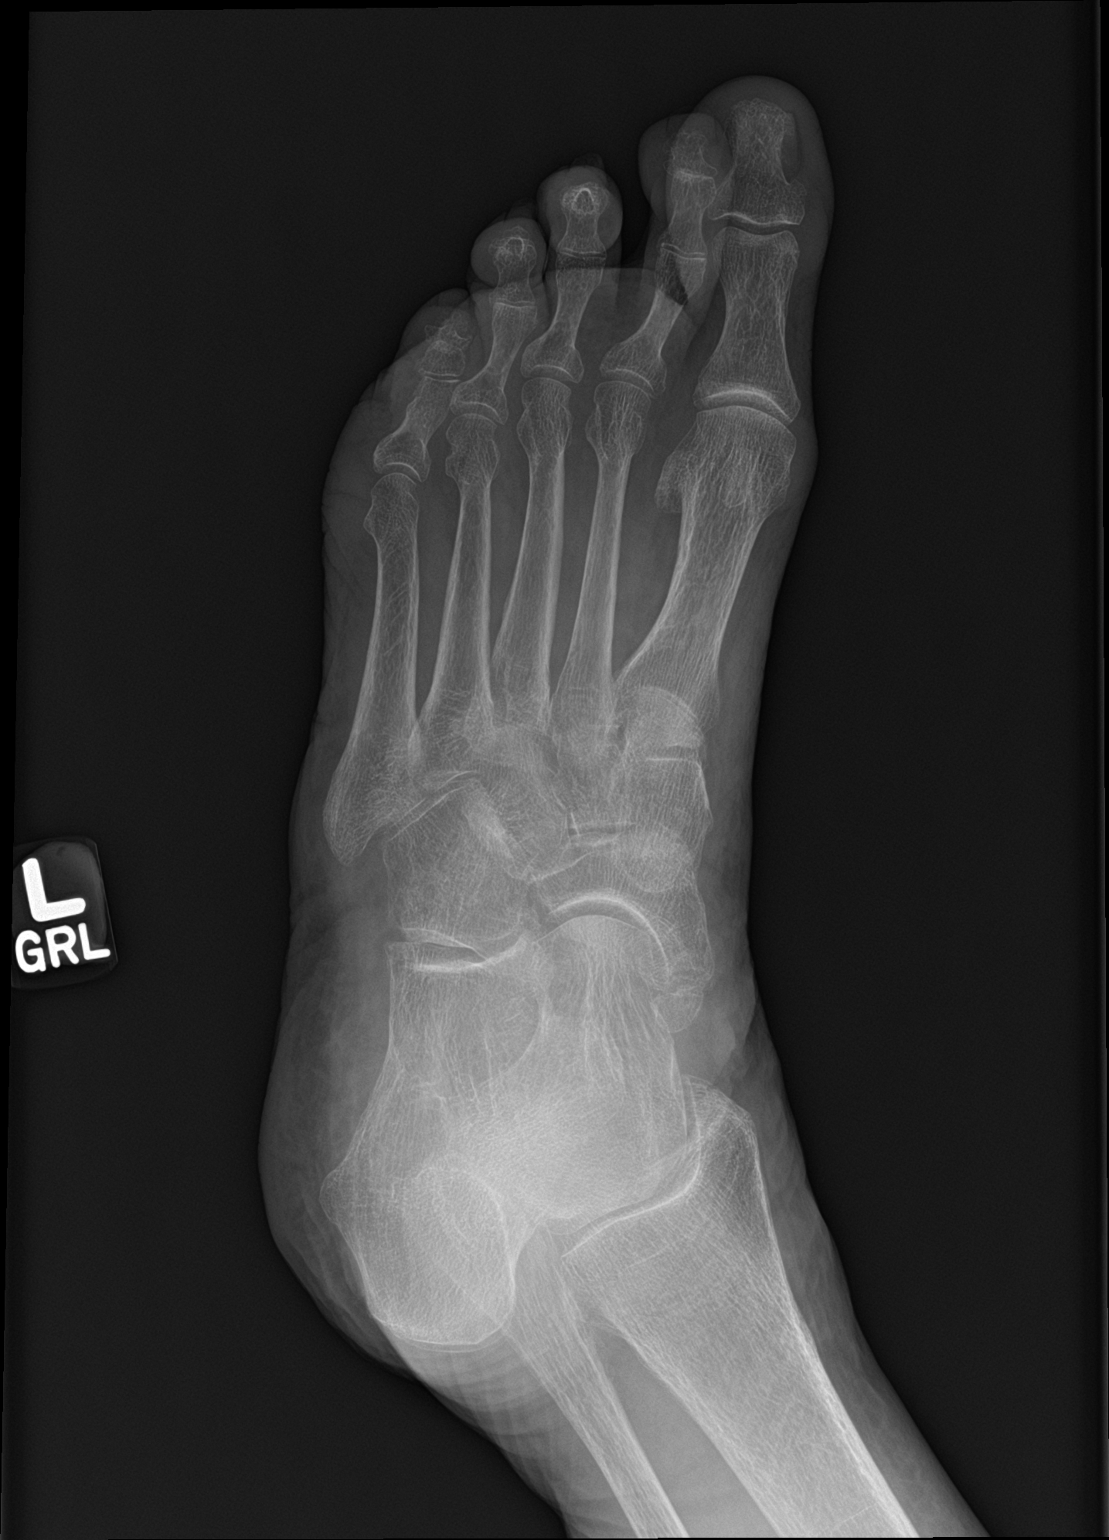

[3 of 3 positions shown; findings below may reference images not displayed]

FINDINGS: There is no evidence of fracture or dislocation. Hammertoe deformity
of the digits. The bones are under mineralized. Incidental os
navicular. Soft tissues are unremarkable.
IMPRESSION: No fracture or acute osseous abnormality of the left foot.

## 2019-12-19 IMAGING — DX DG WRIST COMPLETE 3+V*L*
4 series · 4 of 4 positions shown · non-contrast
Comparison: Pre reduction radiographs.

CLINICAL DATA: Post reduction of distal radius and ulnar fractures.

EXAM:
LEFT WRIST - COMPLETE 3+ VIEW

[wrist ap (1 of 2)]
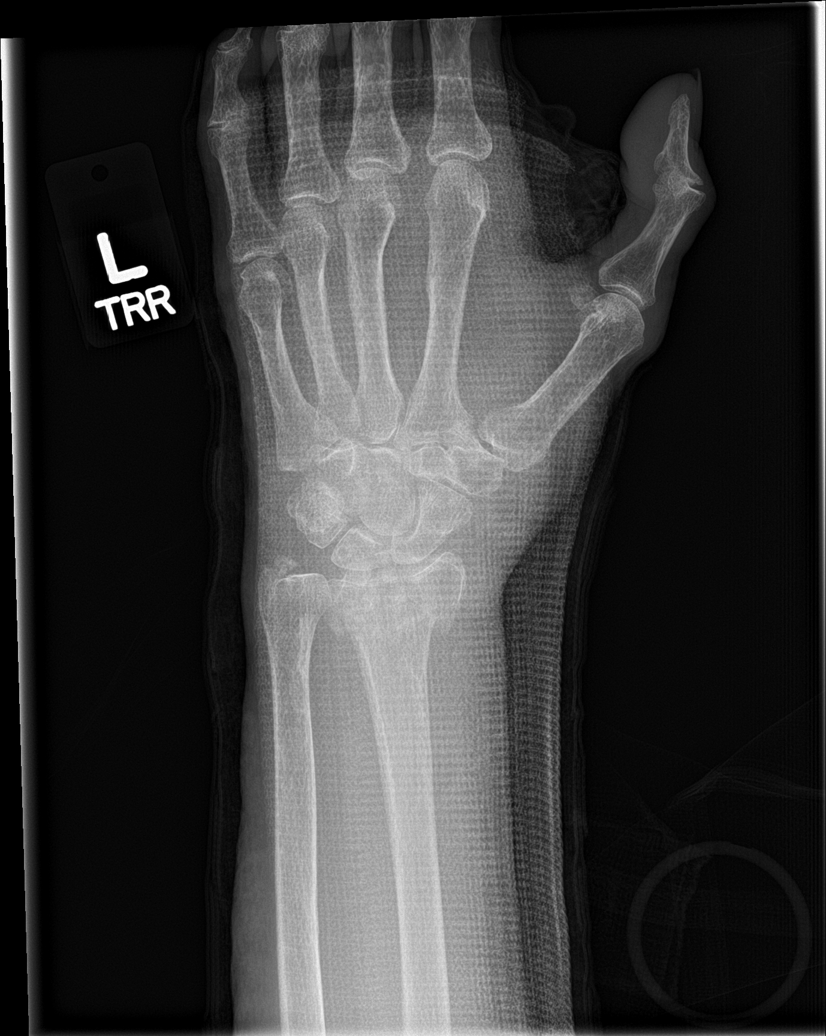

[wrist obl]
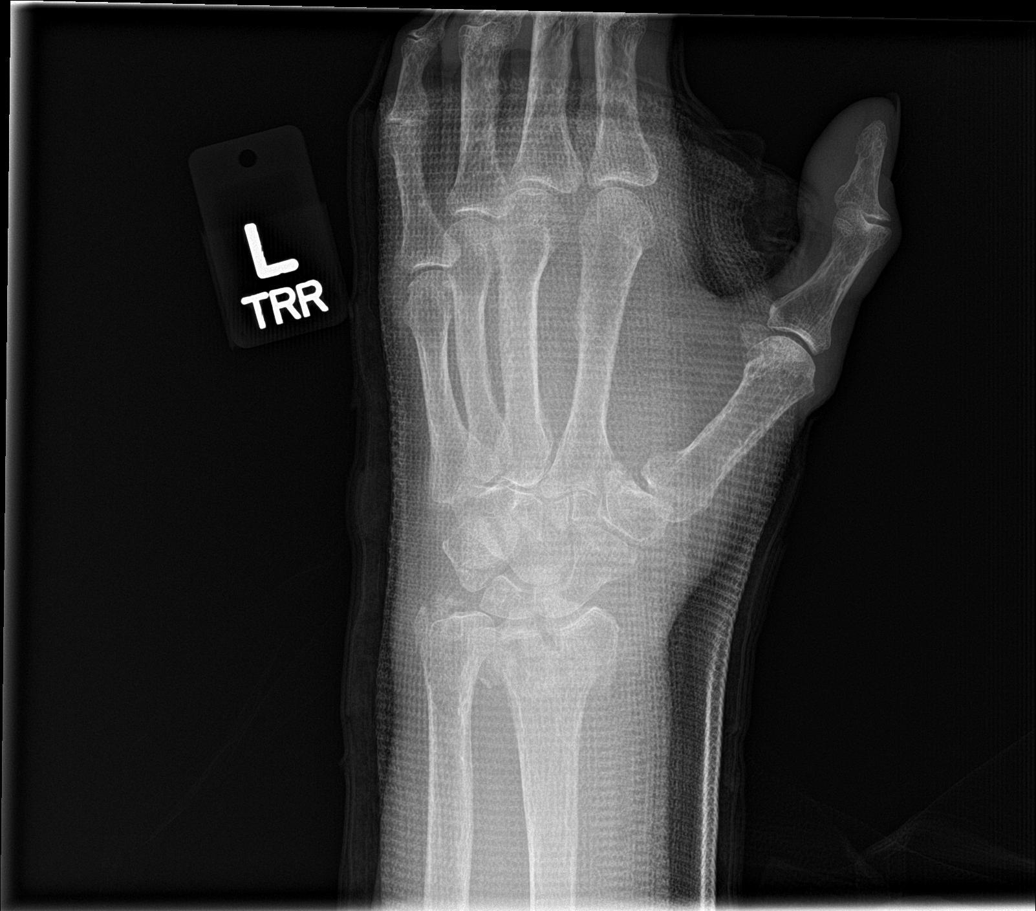

[wrist lat]
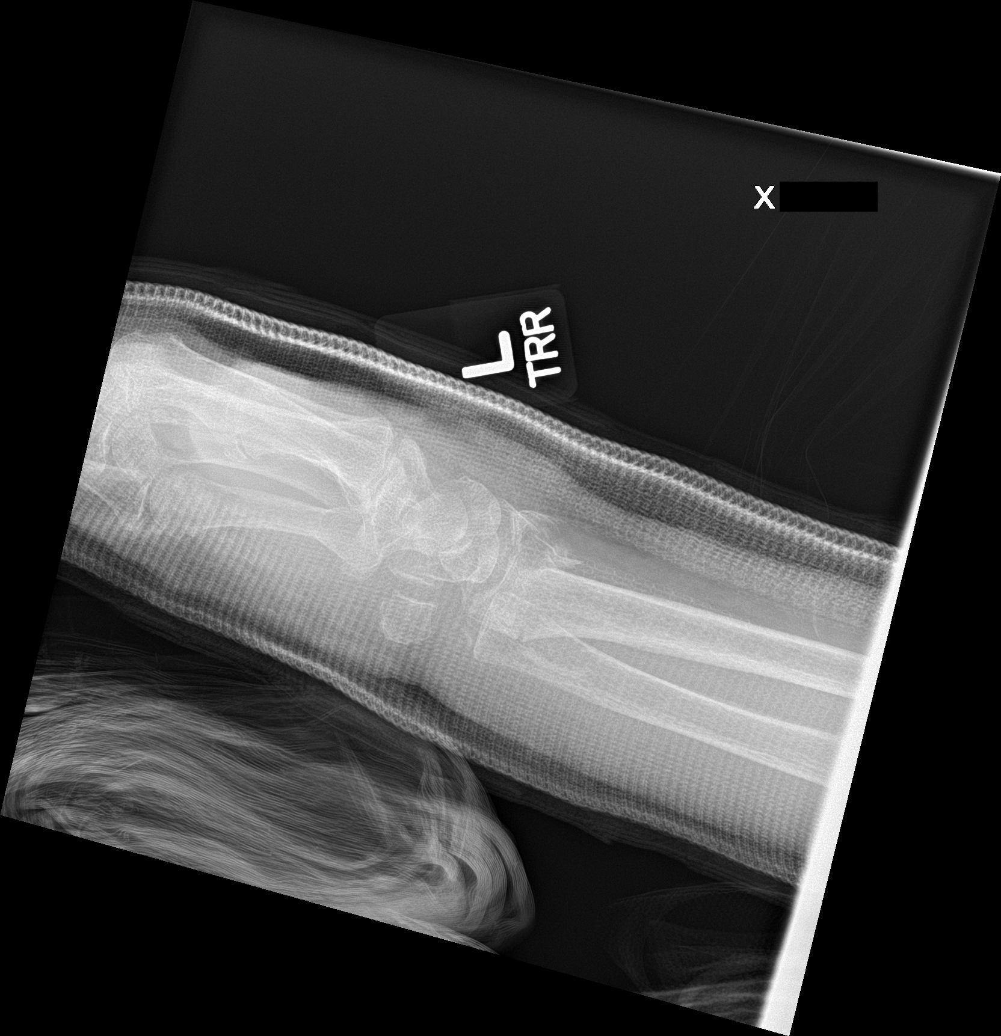

[wrist ap (2 of 2)]
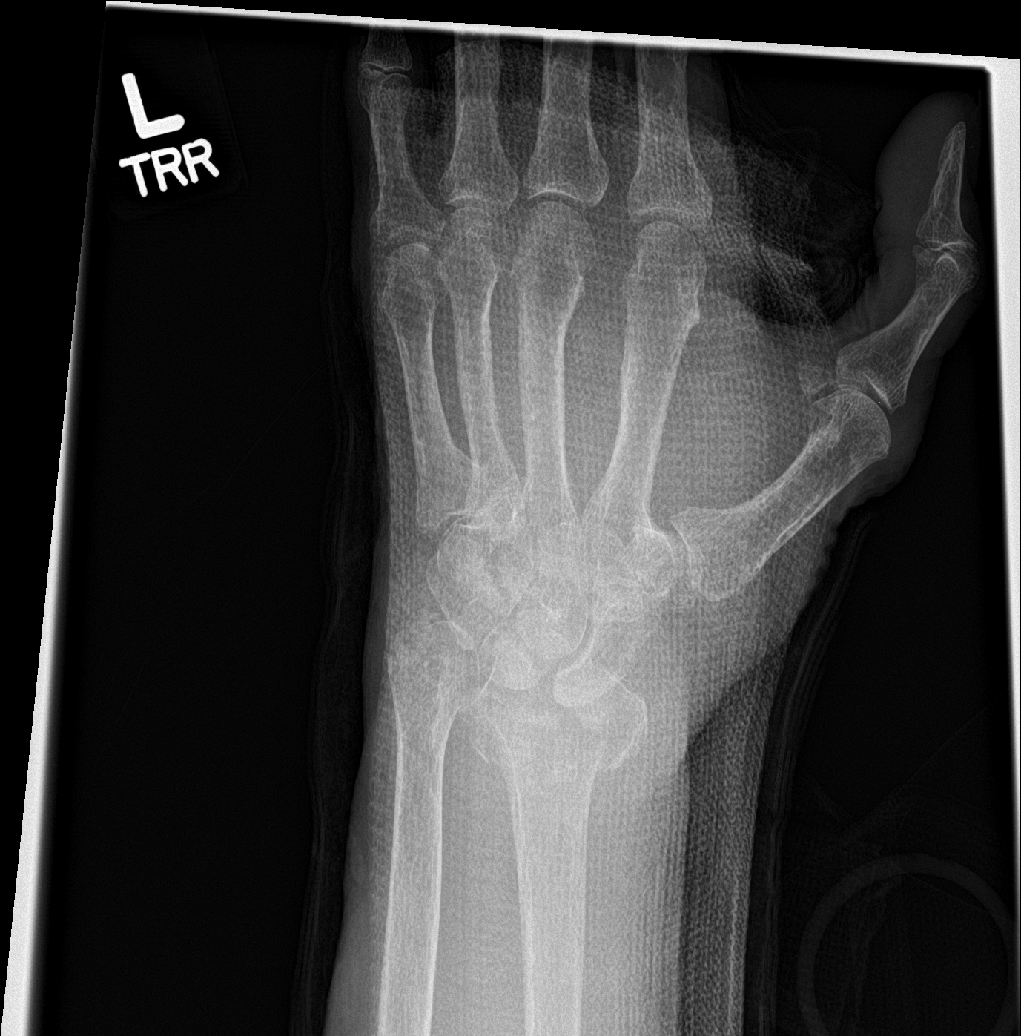

[4 of 4 positions shown; findings below may reference images not displayed]

FINDINGS: Fine bony detail is limited by overlying fiberglass cast. Note is
again made of a comminuted intra-articular fracture of the distal
radius with dorsal angulation of the distal fracture fragment.
Slight decrease in dorsal angulation is demonstrated postreduction.
The comminuted fracture extent into the radiocarpal joint at the
level of the scapholunate interval as well as into the distal
radioulnar joint. Comminuted fracture involving the ulnar styloid is
noted as well. Triangular fibrocartilage complex calcifications are
present.
IMPRESSION: Comminuted fractures of the distal radius and ulna as seen through
fiberglass. Less dorsal angulation of the main distal radius
fracture fragment status post reduction.

## 2020-07-16 ENCOUNTER — Emergency Department: Payer: Medicare Other

## 2020-07-16 ENCOUNTER — Emergency Department
Admission: EM | Admit: 2020-07-16 | Discharge: 2020-07-20 | Disposition: A | Discharge status: Home / Self Care | Payer: Medicare Other | Attending: Emergency Medicine | Admitting: Emergency Medicine

## 2020-07-16 ENCOUNTER — Other Ambulatory Visit: Payer: Self-pay

## 2020-07-16 DIAGNOSIS — G319 Degenerative disease of nervous system, unspecified: Secondary | ICD-10-CM | POA: Insufficient documentation

## 2020-07-16 DIAGNOSIS — S92352A Displaced fracture of fifth metatarsal bone, left foot, initial encounter for closed fracture: Secondary | ICD-10-CM | POA: Insufficient documentation

## 2020-07-16 DIAGNOSIS — S0990XA Unspecified injury of head, initial encounter: Secondary | ICD-10-CM | POA: Diagnosis not present

## 2020-07-16 DIAGNOSIS — Z87891 Personal history of nicotine dependence: Secondary | ICD-10-CM | POA: Insufficient documentation

## 2020-07-16 DIAGNOSIS — M25572 Pain in left ankle and joints of left foot: Secondary | ICD-10-CM | POA: Insufficient documentation

## 2020-07-16 DIAGNOSIS — S99922A Unspecified injury of left foot, initial encounter: Secondary | ICD-10-CM | POA: Diagnosis present

## 2020-07-16 DIAGNOSIS — M542 Cervicalgia: Secondary | ICD-10-CM | POA: Insufficient documentation

## 2020-07-16 DIAGNOSIS — Z7982 Long term (current) use of aspirin: Secondary | ICD-10-CM | POA: Diagnosis not present

## 2020-07-16 DIAGNOSIS — W01198A Fall on same level from slipping, tripping and stumbling with subsequent striking against other object, initial encounter: Secondary | ICD-10-CM | POA: Diagnosis not present

## 2020-07-16 DIAGNOSIS — I10 Essential (primary) hypertension: Secondary | ICD-10-CM | POA: Insufficient documentation

## 2020-07-16 DIAGNOSIS — Z7901 Long term (current) use of anticoagulants: Secondary | ICD-10-CM | POA: Diagnosis not present

## 2020-07-16 DIAGNOSIS — Z79899 Other long term (current) drug therapy: Secondary | ICD-10-CM | POA: Diagnosis not present

## 2020-07-16 DIAGNOSIS — J320 Chronic maxillary sinusitis: Secondary | ICD-10-CM | POA: Diagnosis not present

## 2020-07-16 DIAGNOSIS — Y92129 Unspecified place in nursing home as the place of occurrence of the external cause: Secondary | ICD-10-CM | POA: Diagnosis not present

## 2020-07-16 DIAGNOSIS — M25571 Pain in right ankle and joints of right foot: Secondary | ICD-10-CM | POA: Insufficient documentation

## 2020-07-16 DIAGNOSIS — S99192A Other physeal fracture of left metatarsal, initial encounter for closed fracture: Secondary | ICD-10-CM

## 2020-07-16 DIAGNOSIS — W19XXXA Unspecified fall, initial encounter: Secondary | ICD-10-CM

## 2020-07-16 DIAGNOSIS — M79671 Pain in right foot: Secondary | ICD-10-CM | POA: Insufficient documentation

## 2020-07-16 HISTORY — DX: Unspecified atrial fibrillation: I48.91

## 2020-07-16 LAB — CBC WITH DIFFERENTIAL/PLATELET
Abs Immature Granulocytes: 0.03 10*3/uL (ref 0.00–0.07)
Basophils Absolute: 0 10*3/uL (ref 0.0–0.1)
Basophils Relative: 0 %
Eosinophils Absolute: 0.1 10*3/uL (ref 0.0–0.5)
Eosinophils Relative: 1 %
HCT: 35.7 % — ABNORMAL LOW (ref 36.0–46.0)
Hemoglobin: 11.7 g/dL — ABNORMAL LOW (ref 12.0–15.0)
Immature Granulocytes: 0 %
Lymphocytes Relative: 10 %
Lymphs Abs: 0.8 10*3/uL (ref 0.7–4.0)
MCH: 29.6 pg (ref 26.0–34.0)
MCHC: 32.8 g/dL (ref 30.0–36.0)
MCV: 90.4 fL (ref 80.0–100.0)
Monocytes Absolute: 1 10*3/uL (ref 0.1–1.0)
Monocytes Relative: 12 %
Neutro Abs: 6.2 10*3/uL (ref 1.7–7.7)
Neutrophils Relative %: 77 %
Platelets: 228 10*3/uL (ref 150–400)
RBC: 3.95 MIL/uL (ref 3.87–5.11)
RDW: 13.2 % (ref 11.5–15.5)
WBC: 8.1 10*3/uL (ref 4.0–10.5)
nRBC: 0 % (ref 0.0–0.2)

## 2020-07-16 LAB — BASIC METABOLIC PANEL
Anion gap: 7 (ref 5–15)
BUN: 16 mg/dL (ref 8–23)
CO2: 27 mmol/L (ref 22–32)
Calcium: 9.7 mg/dL (ref 8.9–10.3)
Chloride: 95 mmol/L — ABNORMAL LOW (ref 98–111)
Creatinine, Ser: 1.08 mg/dL — ABNORMAL HIGH (ref 0.44–1.00)
GFR, Estimated: 50 mL/min — ABNORMAL LOW (ref >60)
Glucose, Bld: 113 mg/dL — ABNORMAL HIGH (ref 70–99)
Potassium: 5.6 mmol/L — ABNORMAL HIGH (ref 3.5–5.1)
Sodium: 129 mmol/L — ABNORMAL LOW (ref 135–145)

## 2020-07-16 MED ORDER — ROSUVASTATIN CALCIUM 20 MG PO TABS
20.0000 mg | ORAL_TABLET | Freq: Every day | ORAL | Status: DC
  Administered 2020-07-17 – 2020-07-19 (×3): 20 mg via ORAL
  Filled 2020-07-16 (×4): qty 1

## 2020-07-16 MED ORDER — SPIRONOLACTONE 25 MG PO TABS
25.0000 mg | ORAL_TABLET | Freq: Every day | ORAL | Status: DC
  Administered 2020-07-17 – 2020-07-20 (×4): 25 mg via ORAL
  Filled 2020-07-16 (×5): qty 1

## 2020-07-16 MED ORDER — VITAMIN D 25 MCG (1000 UNIT) PO TABS
2000.0000 [IU] | ORAL_TABLET | Freq: Every day | ORAL | Status: DC
  Administered 2020-07-17 – 2020-07-20 (×4): 2000 [IU] via ORAL
  Filled 2020-07-16 (×4): qty 2

## 2020-07-16 MED ORDER — LISINOPRIL 10 MG PO TABS
20.0000 mg | ORAL_TABLET | Freq: Every day | ORAL | Status: DC
  Administered 2020-07-17 – 2020-07-20 (×3): 20 mg via ORAL
  Filled 2020-07-16 (×4): qty 2

## 2020-07-16 MED ORDER — HYDRALAZINE HCL 50 MG PO TABS
50.0000 mg | ORAL_TABLET | Freq: Three times a day (TID) | ORAL | Status: DC
  Administered 2020-07-17 – 2020-07-18 (×5): 50 mg via ORAL
  Administered 2020-07-18: 25 mg via ORAL
  Administered 2020-07-19 – 2020-07-20 (×3): 50 mg via ORAL
  Filled 2020-07-16 (×10): qty 1

## 2020-07-16 MED ORDER — OXYCODONE-ACETAMINOPHEN 5-325 MG PO TABS
1.0000 | ORAL_TABLET | Freq: Once | ORAL | Status: AC
  Administered 2020-07-17: 1 via ORAL
  Filled 2020-07-16 (×2): qty 1

## 2020-07-16 MED ORDER — FAMOTIDINE 20 MG PO TABS
40.0000 mg | ORAL_TABLET | Freq: Every day | ORAL | Status: DC
  Administered 2020-07-17 – 2020-07-19 (×4): 40 mg via ORAL
  Filled 2020-07-16 (×5): qty 2

## 2020-07-16 MED ORDER — ATENOLOL 25 MG PO TABS
50.0000 mg | ORAL_TABLET | Freq: Every day | ORAL | Status: DC
  Administered 2020-07-17 – 2020-07-20 (×4): 50 mg via ORAL
  Filled 2020-07-16 (×4): qty 2

## 2020-07-16 MED ORDER — POTASSIUM CHLORIDE CRYS ER 20 MEQ PO TBCR
10.0000 meq | EXTENDED_RELEASE_TABLET | Freq: Two times a day (BID) | ORAL | Status: DC
  Filled 2020-07-16: qty 1

## 2020-07-16 MED ORDER — PATIROMER SORBITEX CALCIUM 8.4 G PO PACK
8.4000 g | PACK | Freq: Once | ORAL | Status: AC
  Administered 2020-07-16: 8.4 g via ORAL
  Filled 2020-07-16: qty 1

## 2020-07-16 MED ORDER — POLYSACCHARIDE IRON COMPLEX 150 MG PO CAPS
150.0000 mg | ORAL_CAPSULE | Freq: Every day | ORAL | Status: DC
  Administered 2020-07-17 – 2020-07-20 (×4): 150 mg via ORAL
  Filled 2020-07-16 (×5): qty 1

## 2020-07-16 MED ORDER — PANTOPRAZOLE SODIUM 40 MG PO TBEC
40.0000 mg | DELAYED_RELEASE_TABLET | Freq: Every day | ORAL | Status: DC
  Administered 2020-07-17 – 2020-07-20 (×4): 40 mg via ORAL
  Filled 2020-07-16 (×4): qty 1

## 2020-07-16 MED ORDER — APIXABAN 2.5 MG PO TABS
2.5000 mg | ORAL_TABLET | Freq: Two times a day (BID) | ORAL | Status: DC
  Administered 2020-07-17 – 2020-07-20 (×7): 2.5 mg via ORAL
  Filled 2020-07-16 (×9): qty 1

## 2020-07-16 MED ORDER — CITALOPRAM HYDROBROMIDE 20 MG PO TABS
20.0000 mg | ORAL_TABLET | Freq: Every day | ORAL | Status: DC
  Administered 2020-07-17 – 2020-07-20 (×4): 20 mg via ORAL
  Filled 2020-07-16 (×4): qty 1

## 2020-07-16 MED ORDER — ACETAMINOPHEN 500 MG PO TABS
500.0000 mg | ORAL_TABLET | Freq: Three times a day (TID) | ORAL | Status: DC | PRN
  Administered 2020-07-17 (×2): 500 mg via ORAL
  Filled 2020-07-16 (×2): qty 1

## 2020-07-16 NOTE — ED Notes (Signed)
Assumed care of pt upon being roomed. Pt ate 80% of [place] sandwich, chips, and crackers. Son at bedside but getting ready to leave. Bed sheets changed, pt placed into a gown. Purewick placed for ease of voiding. Splint noted to LLE, left toes warm, cap refill <2 seconds. Pt ao x4, talking in full sentences with regular and unlabored breathing.

## 2020-07-16 NOTE — ED Provider Notes (Signed)
[facility] Emergency Department Provider Note   ____________________________________________   First MD Initiated Contact with Patient 07/16/20 1945     (approximate)  I have reviewed the triage vital signs and the nursing notes.   HISTORY  Chief Complaint Fall    HPI Debbie Hampton is a 86 y.o. female with past medical history of hypertension, stroke, and atrial fibrillation on Eliquis who presents to the ED following fall.  Patient reports that she had fallen asleep in her chair tonight at [facility], and when she woke up she was concerned she missed dinner.  She stood up from her chair quickly and lost her balance, falling forward with her feet talking under her and hitting her head.  She denies losing consciousness, but does take Eliquis.  She primarily complains of pain to her left foot, but also has pain to her left ankle, right foot and ankle.  She additionally complains of pain in her neck but denies any chest pain, abdominal pain, hip pain, or upper extremity pain.  Staff had also reportedly been concerned earlier in the day about increased swelling to her lower extremities.  Patient is unsure how long her legs have been swollen, does note that red discoloration to her legs has been present ever since she started Eliquis in March.        Past Medical History:  Diagnosis Date  . Bilateral shoulder pain   . Colon polyp   . Constipation   . GERD (gastroesophageal reflux disease)   . Hypertension   . Impaired fasting glucose   . Syncope 03/2016   with [facility]  . Varicose veins of both lower extremities     Patient Active Problem List   Diagnosis Date Noted  . Atrial fibrillation (HCC)   . Hypertension   . Elevated troponin 11/26/2019  . Labile blood pressure   . Benign essential HTN   . Hypertensive crisis   . Hyponatremia   . Prediabetes   . Hypoalbuminemia due to protein-calorie malnutrition (HCC)   . Acute blood loss anemia   . Stroke  (cerebrum) (HCC) 08/01/2017  . Acute CVA (cerebrovascular accident) (HCC) 07/29/2017  . Syncope, near 03/31/2016    Past Surgical History:  Procedure Laterality Date  . TONSILLECTOMY    . TUMOR REMOVAL     ovaries    Prior to Admission medications   Medication Sig Start Date End Date Taking? Authorizing Provider  acetaminophen (TYLENOL) 500 MG tablet Take 500 mg by mouth every 8 (eight) hours as needed.    [provider]  aspirin EC 81 MG EC tablet Take 1 tablet (81 mg total) daily by mouth. 08/01/17   Adrian Saran, MD  atenolol (TENORMIN) 50 MG tablet Take 1 tablet (50 mg total) by mouth daily. 08/23/18   Adrian Saran, MD  Cholecalciferol (VITAMIN D) 2000 units tablet Take 2,000 Units by mouth daily.     [provider]  citalopram (CELEXA) 20 MG tablet Take 20 mg by mouth daily.     [provider]  clobetasol cream (TEMOVATE) 0.05 % Apply 1 application topically 2 (two) times daily as needed (rash or itching). (apply to feet, ankles, arms, back, buttocks and perineal area)    [provider]  clopidogrel (PLAVIX) 75 MG tablet Take 1 tablet (75 mg total) by mouth daily. 08/13/17   Love, Evlyn Kanner, PA-C  hydrALAZINE (APRESOLINE) 50 MG tablet Take 1 tablet (50 mg total) by mouth 3 (three) times daily. 08/13/17  Love, Pamela S, PA-C  iron polysaccharides (POLY-IRON 150) 150 MG capsule Take 150 mg by mouth daily.    [provider]  lisinopril (PRINIVIL,ZESTRIL) 40 MG tablet Take 1 tablet (40 mg total) by mouth daily. 08/13/17   Love, Evlyn Kanner, PA-C  Multiple Vitamins-Minerals (CERTAVITE/ANTIOXIDANTS) TABS Take 1 tablet by mouth daily.    [provider]  omega-3 acid ethyl esters (LOVAZA) 1 g capsule Take 1 g by mouth 2 (two) times daily.    [provider]  pantoprazole (PROTONIX) 40 MG tablet Take 1 tablet (40 mg total) by mouth daily. 08/14/17   Love, Evlyn Kanner, PA-C  Polyethyl Glycol-Propyl Glycol (SYSTANE ULTRA) 0.4-0.3 % SOLN  Apply 1 drop to eye 3 (three) times daily.    [provider]  polyethylene glycol (MIRALAX / GLYCOLAX) packet Take 17 g by mouth daily. Patient taking differently: Take 17 g by mouth daily.  08/14/17   Love, Evlyn Kanner, PA-C  potassium chloride (K-DUR,KLOR-CON) 10 MEQ tablet Take 1 tablet (10 mEq total) by mouth 2 (two) times daily. 08/13/17   Love, Evlyn Kanner, PA-C  rosuvastatin (CRESTOR) 20 MG tablet Take 20 mg by mouth at bedtime.    [provider]  sodium chloride (OCEAN) 0.65 % SOLN nasal spray Place 1 spray into both nostrils 3 (three) times daily as needed for congestion.    [provider]  spironolactone (ALDACTONE) 25 MG tablet Take 1 tablet (25 mg total) by mouth daily. 08/24/18   Adrian Saran, MD    Allergies Mushroom extract complex, Penicillins, Aloe, Amlodipine, Cephalexin, Metoprolol, Miconazole, Neosporin [neomycin-bacitracin zn-polymyx], and Trandolapril-verapamil hcl er  Family History  Problem Relation Age of Onset  . Hypertension Other   . Breast cancer Sister   . Kidney cancer Neg Hx   . Kidney disease Neg Hx   . Prostate cancer Neg Hx     Social History Social History   Tobacco Use  . Smoking status: Former Smoker    Types: Cigarettes  . Smokeless tobacco: Never Used  . Tobacco comment: quit 1985  Substance Use Topics  . Alcohol use: No  . Drug use: No    Review of Systems  Constitutional: No fever/chills Eyes: No visual changes. ENT: No sore throat. Cardiovascular: Denies chest pain. Respiratory: Denies shortness of breath. Gastrointestinal: No abdominal pain.  No nausea, no vomiting.  No diarrhea.  No constipation. Genitourinary: Negative for dysuria. Musculoskeletal: Negative for back pain.  Positive for bilateral foot and ankle pain.  Positive for neck pain.  Positive for leg swelling. Skin: Negative for rash. Neurological: Negative for headaches, focal weakness or  numbness.  ____________________________________________   PHYSICAL EXAM:  VITAL SIGNS: ED Triage Vitals  Enc Vitals Group     BP 07/16/20 1941 (!) 168/88     Pulse Rate 07/16/20 1941 75     Resp 07/16/20 1941 18     Temp --      Temp src --      SpO2 07/16/20 1941 97 %     Weight --      Height --      Head Circumference --      [place] Flow --      Pain Score 07/16/20 1942 6     Pain Loc --      Pain Edu? --      Excl. in GC? --     Constitutional: Alert and oriented. Eyes: Conjunctivae are normal. Head: Atraumatic. Nose: No congestion/rhinnorhea. Mouth/Throat: Mucous membranes are  moist. Neck: Normal ROM, positive for midline cervical spine tenderness. Cardiovascular: Normal rate, regular rhythm. Grossly normal heart sounds.  2+ DP pulses bilaterally. Respiratory: Normal respiratory effort.  No retractions. Lungs CTAB. Gastrointestinal: Soft and nontender. No distention. Genitourinary: deferred Musculoskeletal: Trace pitting edema to knees bilaterally.  Tenderness over lateral portion of left foot as well as diffusely of bilateral ankles and right foot.  No knee tenderness or hip tenderness bilaterally. Neurologic:  Normal speech and language. No gross focal neurologic deficits are appreciated. Skin:  Skin is warm, dry and intact. No rash noted. Psychiatric: Mood and affect are normal. Speech and behavior are normal.  ____________________________________________   LABS (all labs ordered are listed, but only abnormal results are displayed)  Labs Reviewed  CBC WITH DIFFERENTIAL/PLATELET - Abnormal; Notable for the following components:      Result Value   Hemoglobin 11.7 (*)    HCT 35.7 (*)    All other components within normal limits  BASIC METABOLIC PANEL - Abnormal; Notable for the following components:   Sodium 129 (*)    Potassium 5.6 (*)    Chloride 95 (*)    Glucose, Bld 113 (*)    Creatinine, Ser 1.08 (*)    GFR, Estimated 50 (*)    All other components  within normal limits     PROCEDURES  Procedure(s) performed (including Critical Care):  Procedures   ____________________________________________   INITIAL IMPRESSION / ASSESSMENT AND PLAN / ED COURSE       86 year old female with past medical history of hypertension, stroke, and atrial fibrillation on Eliquis who presents to the ED complaining of bilateral foot pain and neck pain following mechanical fall earlier today.  We will check CT head and C-spine as well as x-rays of bilateral feet and ankles.  Staff at [facility] was also concerned about lower extremity swelling worsening over the past couple of days, we will screen basic labs and ultrasound of lower extremities to rule out DVT.  She does have some red discoloration to her lower extremities, which she states has been present since starting Eliquis 6 months ago.  This does not appear consistent with a cellulitis and she has no tenderness of her legs to suggest infection.  Ultrasound is negative for DVT, x-rays of lower extremities are remarkable only for Jones fracture of her left foot. CT head and C-spine are negative for acute process. Lab work remarkable for mild hyperkalemia, which we will treat with Veltassa. We will place a posterior short leg splint on the left for her Jones fracture and she will need outpatient follow-up with podiatry. Unfortunately, she currently resides at the assisted living portion of [facility] and as she will need to be nonweightbearing on her left lower extremity, she will have significant difficulty caring for herself. Her son has requested that we involve social work in order to better ensure her safety until she may follow-up for her foot fracture.     ____________________________________________   FINAL CLINICAL IMPRESSION(S) / ED DIAGNOSES  Final diagnoses:  Fall, initial encounter  Closed fracture of base of fifth metatarsal bone of left foot at metaphyseal-diaphyseal junction, initial  encounter     ED Discharge Orders    None       Note:  This document was prepared using Dragon voice recognition software and may include unintentional dictation errors.   Chesley Noon, MD 07/16/20 2241

## 2020-07-16 NOTE — ED Notes (Signed)
ED Dede Query at bedside to apply splint to LLE.

## 2020-07-16 NOTE — ED Notes (Signed)
Patient transported to X-ray 

## 2020-07-16 NOTE — ED Notes (Signed)
Pharmacy called to verify patient home meds. Advising will come to bedside and verify when able.

## 2020-07-16 NOTE — ED Triage Notes (Signed)
Patient brought in by EMS, per EMS [facility] staff found patient on ground. Pt complaint of bilateral ankle pain, redness and warmth noticed to bilateral feet. Pt states she fell onto her butt/back.

## 2020-07-17 LAB — BASIC METABOLIC PANEL
Anion gap: 9 (ref 5–15)
BUN: 15 mg/dL (ref 8–23)
CO2: 24 mmol/L (ref 22–32)
Calcium: 9.6 mg/dL (ref 8.9–10.3)
Chloride: 96 mmol/L — ABNORMAL LOW (ref 98–111)
Creatinine, Ser: 0.9 mg/dL (ref 0.44–1.00)
GFR, Estimated: >60 mL/min (ref >60)
Glucose, Bld: 151 mg/dL — ABNORMAL HIGH (ref 70–99)
Potassium: 4.8 mmol/L (ref 3.5–5.1)
Sodium: 129 mmol/L — ABNORMAL LOW (ref 135–145)

## 2020-07-17 MED ORDER — METHOCARBAMOL 500 MG PO TABS
500.0000 mg | ORAL_TABLET | Freq: Once | ORAL | Status: AC
  Administered 2020-07-17: 500 mg via ORAL
  Filled 2020-07-17 (×2): qty 1

## 2020-07-17 MED ORDER — OXYCODONE HCL 5 MG PO TABS
5.0000 mg | ORAL_TABLET | ORAL | Status: DC | PRN
  Administered 2020-07-17 – 2020-07-20 (×8): 5 mg via ORAL
  Filled 2020-07-17 (×8): qty 1

## 2020-07-17 MED ORDER — ACETAMINOPHEN 500 MG PO TABS
1000.0000 mg | ORAL_TABLET | Freq: Four times a day (QID) | ORAL | Status: DC | PRN
  Administered 2020-07-18 – 2020-07-19 (×2): 1000 mg via ORAL
  Filled 2020-07-17 (×2): qty 2

## 2020-07-17 MED ORDER — ONDANSETRON HCL 4 MG/2ML IJ SOLN
INTRAMUSCULAR | Status: AC
  Filled 2020-07-17: qty 2

## 2020-07-17 MED ORDER — ONDANSETRON HCL 4 MG/2ML IJ SOLN
4.0000 mg | Freq: Once | INTRAMUSCULAR | Status: AC
  Administered 2020-07-17: 4 mg via INTRAVENOUS

## 2020-07-17 MED ORDER — POLYETHYLENE GLYCOL 3350 17 G PO PACK
17.0000 g | PACK | Freq: Every day | ORAL | Status: DC
  Administered 2020-07-17 – 2020-07-20 (×4): 17 g via ORAL
  Filled 2020-07-17 (×4): qty 1

## 2020-07-17 NOTE — ED Notes (Signed)
Breakfast order called to dietary

## 2020-07-17 NOTE — ED Notes (Signed)
Pt medicated per MAR, provided with additional warm blanket, denies needs at this time

## 2020-07-17 NOTE — ED Notes (Signed)
Pt given breakfast tray; assisted with positioning and food preparation. Pt alert; nad noted.

## 2020-07-17 NOTE — TOC Initial Note (Addendum)
Transition of Care [facility]) - Initial/Assessment Note    Patient Details  Name: Debbie Hampton MRN: [id number] Date of Birth: July 05, 1932  [facility] CM/SW Contact:    Marina Goodell Phone Number: [phone] 07/17/2020, 2:09 PM  Clinical Narrative:                  CSW spoke with patient and Jeslynn, [facility]) [phone] and explained to role of TOC in patient care.  Patient stated she lives at [facility], and she is able to perform most ADLs with minimal assistance.  Patient stated she uses a walker to get around, and regularly walks for exersice.  Patient stated she is active with [facility] and received PT/OT once a week.  Patient stated she would like to return to [facility] as soon as possible.  Patient's son, Mr. Scullin stated [facility] informed him they would not be abel to meet the patietn's needs and she would need to go to a SNF for rehab before returning to [facility].  Mr. Cardiff stated he understood the patient was going to be assessed by an orthopedic doctor.  CSW checked with EDP Katrinka Blazing, and he stated there wasn't a need for an ortho consult.  CSW replayed this information to Mr. Cedric Fishman and updated on PT recommendation for SNF.  Mr. Macnaughton inquired about different SNF placements and CSW gave him the Medicare.gov website information.  Mr. Crownover stated their preferred SNF is [facility], [facility], [facility].  CSW stated she would begin SNF placement process and explained placement timeline and process.  Mr. Glasscock verbalized understanding.  CSW also let Mr. Bangert know EDP Katrinka Blazing would be calling him, when he was available, to update him.  Mr. Burry verbalized understanding.   FL2 sent to preferred SNFs, PASRR# [id number]  Expected Discharge Plan: Skilled Nursing Facility Barriers to Discharge: SNF Pending bed offer   Patient Goals and CMS Choice Patient states their goals for this hospitalization  and ongoing recovery are:: To return to [facility]. CMS Medicare.gov Compare Post Acute Care list provided to:: Other (Comment Required) [place], [name] (Son) [phone]) Choice offered to / list presented to : Adult Children [place], [name] (Son) [phone])  Expected Discharge Plan and Services Expected Discharge Plan: Skilled Nursing Facility In-house Referral: Clinical Social Work   Post Acute Care Choice: Skilled Nursing Facility Living arrangements for the past 2 months: Assisted Living Facility                                      Prior Living Arrangements/Services Living arrangements for the past 2 months: Assisted Living Facility Lives with:: Facility Resident Patient language and need for interpreter reviewed:: Yes        Need for Family Participation in Patient Care: Yes (Comment) Care giver support system in place?: Yes (comment) Current home services: Home PT, Home OT ([facility] -  PT/OT 1X per week.) Criminal Activity/Legal Involvement Pertinent to Current Situation/Hospitalization: No - Comment as needed  Activities of Daily Living      Permission Sought/Granted      Share Information with NAME: Hilda, Rynders (Son) [phone]  Permission granted to share info w AGENCY: [facility]        Emotional Assessment Appearance:: Appears stated age Attitude/Demeanor/Rapport: Engaged Affect (typically observed): Pleasant Orientation: : Oriented to Self, Oriented to Place,  Oriented to Situation Alcohol / Substance Use: Not Applicable Psych Involvement: No (comment)  Admission diagnosis:  Fall w/ foot pain Patient Active Problem List   Diagnosis Date Noted  . Atrial fibrillation (HCC)   . Hypertension   . Elevated troponin 11/26/2019  . Labile blood pressure   . Benign essential HTN   . Hypertensive crisis   . Hyponatremia   . Prediabetes   . Hypoalbuminemia due to protein-calorie malnutrition (HCC)   . Acute blood loss  anemia   . 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 Acute CVA (cerebrovascular accident) (HCC) 07/29/2017  . Syncope, near 03/31/2016   PCP:  Pcp, No Pharmacy:   [facility] - [place], [place] - [address] [place] [address] Phone: [phone] Fax: [phone]     Social Determinants of Health (SDOH) Interventions    Readmission Risk Interventions Readmission Risk Prevention Plan 08/23/2018  Post Dischage Appt Complete  Medication Screening Complete  Transportation Screening Complete  PCP follow-up Complete  Some recent data might be hidden

## 2020-07-17 NOTE — ED Notes (Signed)
Pt reports that her pain is back. Told her we will try to find something else to help her, but that she only had the percocet 2.5 hours ago.

## 2020-07-17 NOTE — [place] FL2 (Signed)
[place] MEDICAID FL2 LEVEL OF CARE SCREENING TOOL     IDENTIFICATION  Patient Name: Debbie Hampton Birthdate: Aug 02, 1932 Sex: female Admission Date (Current Location): 07/16/2020  [place] and [place] Number:  Chiropodist and Address:  [facility], [address] [place], [place] [address]      Provider Number: [id number]  Attending Physician Name and Address:  No att. providers found  Relative Name and Phone Number:  Ke[place], Havey [facility]) [phone]    Current Level of Care: Hospital Recommended Level of Care: Skilled Nursing Facility Prior Approval Number:    Date Approved/Denied:   PASRR Number: [id number]  Discharge Plan: SNF    Current Diagnoses: Patient Active Problem List   Diagnosis Date Noted  . Atrial fibrillation (HCC)   . Hypertension   . Elevated troponin 11/26/2019  . Labile blood pressure   . Benign essential HTN   . Hypertensive crisis   . Hyponatremia   . Prediabetes   . Hypoalbuminemia due to protein-calorie malnutrition (HCC)   . Acute blood loss anemia   . 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 Acute CVA (cerebrovascular accident) (HCC) 07/29/2017  . Syncope, near 03/31/2016    Orientation RESPIRATION BLADDER Height & Weight     Self, Time, Situation, Place  Normal Continent Weight:   Height:     BEHAVIORAL SYMPTOMS/MOOD NEUROLOGICAL BOWEL NUTRITION STATUS      Continent Diet  AMBULATORY STATUS COMMUNICATION OF NEEDS Skin   Limited Assist (hands on assist) Verbally Normal                       Personal Care Assistance Level of Assistance  Bathing, Feeding, Dressing Bathing Assistance: Limited assistance Feeding assistance: Limited assistance Dressing Assistance: Limited assistance     Functional Limitations Info  Sight, Hearing, Speech Sight Info: Adequate Hearing Info: Impaired (Needs hearing aids) Speech Info: Adequate    SPECIAL CARE FACTORS FREQUENCY       PT 7X per week                 Contractures Contractures Info: Not present    Additional Factors Info                  Current Medications (07/17/2020):  This is the current hospital active medication list Current Facility-Administered Medications  Medication Dose Route Frequency Provider Last Rate Last Admin  . acetaminophen (TYLENOL) tablet 500 mg  500 mg Oral Q8H PRN Chesley Noon, MD   500 mg at 07/17/20 1043  . apixaban (ELIQUIS) tablet 2.5 mg  2.5 mg Oral Q12H Chesley Noon, MD   2.5 mg at 07/17/20 1037  . atenolol (TENORMIN) tablet 50 mg  50 mg Oral Daily Chesley Noon, MD   50 mg at 07/17/20 1036  . cholecalciferol (VITAMIN D3) tablet 2,000 Units  2,000 Units Oral Daily Chesley Noon, MD   2,000 Units at 07/17/20 1035  . citalopram (CELEXA) tablet 20 mg  20 mg Oral Daily Chesley Noon, MD   20 mg at 07/17/20 1036  . famotidine (PEPCID) tablet 40 mg  40 mg Oral QHS Chesley Noon, MD   40 mg at 07/17/20 0058  . hydrALAZINE (APRESOLINE) tablet 50 mg  50 mg Oral TID Chesley Noon, MD   50 mg at 07/17/20 1034  . iron polysaccharides (NIFEREX) capsule 150 mg  150 mg Oral Daily Chesley Noon, MD   150 mg at 07/17/20 1037  . lisinopril (ZESTRIL) tablet 20 mg  20 mg Oral Daily Chesley Noon, MD   20 mg at 07/17/20 1035  . pantoprazole (PROTONIX) EC tablet 40 mg  40 mg Oral Daily Chesley Noon, MD   40 mg at 07/17/20 1034  . polyethylene glycol (MIRALAX / GLYCOLAX) packet 17 g  17 g Oral Daily Delton Prairie, MD   17 g at 07/17/20 1047  . rosuvastatin (CRESTOR) tablet 20 mg  20 mg Oral QHS Chesley Noon, MD      . spironolactone (ALDACTONE) tablet 25 mg  25 mg Oral Daily Chesley Noon, MD   25 mg at 07/17/20 1037   Current Outpatient Medications  Medication Sig Dispense Refill  . acetaminophen (TYLENOL) 500 MG tablet Take 500 mg by mouth every 8 (eight) hours as needed.    Marland Kitchen apixaban (ELIQUIS) 2.5 MG TABS tablet Take 2.5 mg by mouth every 12 (twelve) hours.    Marland Kitchen atenolol  (TENORMIN) 50 MG tablet Take 1 tablet (50 mg total) by mouth daily. 30 tablet 0  . Cholecalciferol (VITAMIN D) 2000 units tablet Take 2,000 Units by mouth daily.     . citalopram (CELEXA) 20 MG tablet Take 20 mg by mouth daily.     . clobetasol cream (TEMOVATE) 0.05 % Apply 1 application topically 2 (two) times daily as needed (rash or itching). (apply to feet, ankles, arms, back, buttocks and perineal area)    . famotidine (PEPCID) 40 MG tablet Take 40 mg by mouth at bedtime.    . hydrALAZINE (APRESOLINE) 50 MG tablet Take 1 tablet (50 mg total) by mouth 3 (three) times daily. 90 tablet 0  . iron polysaccharides (POLY-IRON 150) 150 MG capsule Take 150 mg by mouth daily.    Marland Kitchen lisinopril (ZESTRIL) 20 MG tablet Take 20 mg by mouth daily.    . mometasone (ELOCON) 0.1 % lotion Apply 4 drops topically at bedtime as needed for itching or dry skin. Place into each ear.    . Multiple Vitamins-Minerals (CERTAVITE/ANTIOXIDANTS) TABS Take 1 tablet by mouth daily.    Marland Kitchen omega-3 acid ethyl esters (LOVAZA) 1 g capsule Take 1 g by mouth 2 (two) times daily.    . pantoprazole (PROTONIX) 40 MG tablet Take 1 tablet (40 mg total) by mouth daily. 30 tablet 0  . Polyethyl Glycol-Propyl Glycol (SYSTANE ULTRA) 0.4-0.3 % SOLN Place 1 drop into both eyes 3 (three) times daily as needed.     . polyethylene glycol (MIRALAX / GLYCOLAX) packet Take 17 g by mouth daily. (Patient taking differently: Take 17 g by mouth daily. ) 30 each 0  . potassium chloride (K-DUR,KLOR-CON) 10 MEQ tablet Take 1 tablet (10 mEq total) by mouth 2 (two) times daily. 60 tablet 0  . rosuvastatin (CRESTOR) 20 MG tablet Take 20 mg by mouth at bedtime.    . sodium chloride (OCEAN) 0.65 % SOLN nasal spray Place 1 spray into both nostrils 3 (three) times daily as needed for congestion.     Marland Kitchen spironolactone (ALDACTONE) 25 MG tablet Take 1 tablet (25 mg total) by mouth daily. 30 tablet 0     Discharge Medications: Please see discharge summary for a list  of discharge medications.  Relevant Imaging Results:  Relevant Lab Results:   Additional Information SS# [id number]  Joseph Art, LCSWA

## 2020-07-17 NOTE — ED Notes (Addendum)
Pt had multiple episodes emesis. MD Scotty Court made aware, see new orders. Pt cleaned up, new gown and linens placed on pt.

## 2020-07-17 NOTE — Evaluation (Signed)
Physical Therapy Evaluation Patient Details Name: Debbie Hampton MRN: [id number] DOB: 1932/04/18 Today's Date: 07/17/2020   History of Present Illness  presented to ER status post mechanical fall in home environment [facility]) with acute onset of bilat foot/ankle pain; noted with L 5th metatarsal fracture, stabilized in posterior splint, NWB with plans for outpatient orthopedic follow up.  Clinical Impression  Patient resting on stretcher upon arrival to room; alert and oriented to basic information, agreeable to participation with session as tolerated (with min encouragement).  Endorses some degree of baseline hemiparesis to L hemi-body (prior CVA), but has been working with therapy since, recently transitioning to a 'maintenance program' 1x/week.  L ankle immobilized in posterior splint; reports moderate levels of pain and cramping with minimal activity throughout evaluation. Currently requiring mod assist for bed mobility; min assist for sitting balance; mod assist for sit/stand and standing balance.  Constant hands-on assist required for balance and adherence to NWB restrictions. Unable to tolerate additional gait/activity at this time; do anticipate need for [facility] level as primary mobility with transfer assist +1-2 in immediate time-frame.  will continue to progress as appropriate in subsequent sessions. Would benefit from skilled PT to address above deficits and promote optimal return to PLOF.; recommend transition to STR upon discharge from acute hospitalization.     Follow Up Recommendations SNF    Equipment Recommendations  Rolling walker with 5" wheels;3in1 (PT)    Recommendations for Other Services       Precautions / Restrictions Precautions Precautions: Fall Restrictions Weight Bearing Restrictions: Yes LLE Weight Bearing: Non weight bearing      Mobility  Bed Mobility Overal bed mobility: Needs Assistance Bed Mobility: Supine to Sit;Sit to Supine     Supine to sit: Mod  assist Sit to supine: Mod assist        Transfers Overall transfer level: Needs assistance Equipment used: Rolling walker (2 wheeled) Transfers: Sit to/from Stand Sit to Stand: Mod assist         General transfer comment: act assist from therapist to maintain NWB L LE throughout; requiring return to sitting position after 5-6 seconds due to pain, fatigue with effort  Ambulation/Gait             General Gait Details: unsafe/unable at this time  Stairs            Wheelchair Mobility    Modified Rankin (Stroke Patients Only)       Balance Overall balance assessment: Needs assistance Sitting-balance support: No upper extremity supported;Feet supported Sitting balance-Leahy Scale: Fair     Standing balance support: Bilateral upper extremity supported Standing balance-Leahy Scale: Poor                               Pertinent Vitals/Pain Pain Assessment: Faces Faces Pain Scale: Hurts even more Pain Location: L foot/ankle Pain Descriptors / Indicators: Aching;Guarding;Grimacing Pain Intervention(s): Limited activity within patient's tolerance;Monitored during session;Repositioned    Home Living Family/patient expects to be discharged to:: Assisted living               Home Equipment: Walker - 4 wheels;Wheelchair - manual Additional Comments: Resident of [name]    Prior Function Level of Independence: Independent with assistive device(s)         Comments: Mod indep with 4WRW for household distances, manual WC for longer distances as needed; was actively participating with 'maintenance' PT at facility.  Hand Dominance        Extremity/Trunk Assessment   Upper Extremity Assessment Upper Extremity Assessment:  (R UE grossly 4-/5; L UE with noted shoulder elevation deficits, all planes (residual from prior CVA), strength grossly 3- to 4-/5 throughout.  does endorse sensory deficits bilat hands (baseline carpal tunnel))     Lower Extremity Assessment Lower Extremity Assessment:  (R LE grossly 4-/5 throughout; L hip/knee grossly 4-/5, L ankle immobilized, able to wiggle toes with good capillary refill)       Communication   Communication: No difficulties (generally hyperverbal, frequent redirection to task at hand)  Cognition Arousal/Alertness: Awake/alert Behavior During Therapy: WFL for tasks assessed/performed Overall Cognitive Status: Within Functional Limits for tasks assessed                                        General Comments      Exercises Other Exercises Other Exercises: Educated in role of PT, progressive mobilization and NWB restrictions/functional implications; patient voiced undersatnding, will continue to reinforce as needed.   Assessment/Plan    PT Assessment Patient needs continued PT services  PT Problem List Decreased strength;Decreased range of motion;Decreased activity tolerance;Decreased balance;Decreased mobility;Decreased knowledge of use of DME;Decreased safety awareness;Decreased knowledge of precautions;Pain       PT Treatment Interventions DME instruction;Gait training;Functional mobility training;Therapeutic activities;Therapeutic exercise;Balance training;Cognitive remediation;Patient/family education;Neuromuscular re-education    PT Goals (Current goals can be found in the Care Plan section)  Acute Rehab PT Goals Patient Stated Goal: to go to rehab where I can learn to walk again PT Goal Formulation: With patient Time For Goal Achievement: 07/31/20 Potential to Achieve Goals: Fair    Frequency 7X/week   Barriers to discharge Decreased caregiver support      Co-evaluation               AM-PAC PT "6 Clicks" Mobility  Outcome Measure Help needed turning from your back to your side while in a flat bed without using bedrails?: A Little Help needed moving from lying on your back to sitting on the side of a flat bed without using  bedrails?: A Lot Help needed moving to and from a bed to a chair (including a wheelchair)?: Total Help needed standing up from a chair using your arms (e.g., wheelchair or bedside chair)?: A Lot Help needed to walk in hospital room?: Total Help needed climbing 3-5 steps with a railing? : Total 6 Click Score: 10    End of Session Equipment Utilized During Treatment: Gait belt Activity Tolerance: Patient limited by fatigue;Patient limited by pain Patient left: in bed;with call bell/phone within reach   PT Visit Diagnosis: Muscle weakness (generalized) (M62.81);Difficulty in walking, not elsewhere classified (R26.2);Pain Pain - Right/Left: Left Pain - part of body: Ankle and joints of foot    Time: 0935-1009 PT Time Calculation (min) (ACUTE ONLY): 34 min   Charges:   PT Evaluation $PT Eval Moderate Complexity: 1 Mod PT Treatments $Therapeutic Activity: 8-22 mins        Makahla Kiser H. Manson Passey, PT, DPT, NCS 07/17/20, 10:54 AM [phone]

## 2020-07-17 NOTE — ED Notes (Signed)
Pt leg elevated for pain relief

## 2020-07-17 NOTE — ED Notes (Signed)
Pt c/o cramping and pain in left leg; still refuses anything other than tylenol. Pt given tylenol and leg repositioned for comfort.

## 2020-07-17 NOTE — ED Notes (Signed)
Potassium level 5.6 mmol/L, addressed with DR. Manson Passey. Holding PO K order and redraw of BMP, awaiting results. Pt son no longer at bedside. Pt provided with a cup of ice and ice water. Lights dimmed for comfort, call bell within reach, side rails up x2

## 2020-07-18 MED ORDER — SALINE SPRAY 0.65 % NA SOLN
1.0000 | Freq: Three times a day (TID) | NASAL | Status: DC | PRN
  Administered 2020-07-19 – 2020-07-20 (×3): 1 via NASAL
  Filled 2020-07-18 (×2): qty 44

## 2020-07-18 NOTE — ED Provider Notes (Signed)
-----------------------------------------   5:42 AM on 07/18/2020 -----------------------------------------   Blood pressure 116/72, pulse 84, temperature 97.6 F (36.4 C), temperature source Oral, resp. rate 16, SpO2 93 %.  The patient is calm and cooperative at this time.  There have been no acute events since the last update.  Awaiting disposition plan from Social Work team.   Irean Hong, MD 07/18/20 [phone]

## 2020-07-18 NOTE — ED Notes (Signed)
Pt had x 2 episodes of emesis. Hospital gown and bedding changed. Pt repositioned in bed, given toothbrush/toothpaste for self oral care.

## 2020-07-18 NOTE — ED Notes (Signed)
Pt placed on bed pan per request to have bowel movement. Pt called using call bell to state she was unable to have a bowel movement at this time. Bed pan removed from under pt. Pt repositioned in bed and covered with blankets. Ice water and ice chips given per request. Denies further needs at this time.

## 2020-07-18 NOTE — TOC Progression Note (Signed)
Transition of Care [facility]) - Progression Note    Patient Details  Name: Chasiti Waddington MRN: [id number] Date of Birth: 06-23-1932  Transition of Care [facility]) CM/SW Contact  Marina Goodell Phone Number:  [phone] 07/18/2020, 1:58 PM  Clinical Narrative:     [facility] made bed offer, confirmed by Thayer Ohm.  CSW spoke with patient's son Leiann Sporer and updated him on bed offer and explained to him next steps with insurance auth.  Mr. Nylander verbalized understanding.  Expected Discharge Plan: Skilled Nursing Facility Barriers to Discharge: SNF Pending bed offer  Expected Discharge Plan and Services Expected Discharge Plan: Skilled Nursing Facility In-house Referral: Clinical Social Work   Post Acute Care Choice: Skilled Nursing Facility Living arrangements for the past 2 months: Assisted Living Facility                                       Social Determinants of Health (SDOH) Interventions    Readmission Risk Interventions Readmission Risk Prevention Plan 08/23/2018  Post Dischage Appt Complete  Medication Screening Complete  Transportation Screening Complete  PCP follow-up Complete  Some recent data might be hidden

## 2020-07-18 NOTE — ED Provider Notes (Signed)
Emergency Medicine Observation Re-evaluation Note  Debbie Hampton is a 86 y.o. female, seen on rounds today.  Pt initially presented to the ED for complaints of Fall   Physical Exam  BP (!) 155/74   Pulse (!) 59   Temp 98.3 F (36.8 C)   Resp 17   SpO2 96%  Physical Exam General: Patient sitting in bed waving happily at me Lungs: Patient breathing easily in no respiratory distress Psych: Patient calm and not agitated  ED Course / MDM  EKG:      Plan  Patient awaiting placement. Patient sodium is slightly low but stable potassium has been elevated and her GFR had been decreased but these have improved. We will likely check labs again tomorrow.   Arnaldo Natal, MD 07/18/20 [phone]

## 2020-07-18 NOTE — ED Notes (Signed)
Patient is resting comfortably. 

## 2020-07-18 NOTE — ED Notes (Signed)
Patient denies pain and is resting comfortably.  

## 2020-07-18 NOTE — ED Notes (Signed)
Lunch tray provided and set up for Pt. Coffee and water requested/provided.

## 2020-07-18 NOTE — ED Notes (Signed)
Pt awake, making conversation, concerned about her "belly gurgling", requesting her Miralax.

## 2020-07-18 NOTE — ED Notes (Signed)
Pt given soup, blanket and positioned in bed , son in room and updated on status of patient

## 2020-07-18 NOTE — Progress Notes (Signed)
PT Cancellation Note  Patient Details Name: Debbie Hampton MRN: [id number] DOB: Feb 02, 1932   Cancelled Treatment:    Reason Eval/Treat Not Completed:  (Treatment session attempted. Patient generally perseverative on bowels and need for BM; difficult to redirect for participation with session.  Reports receiving meds to address x2 today.  Will continue efforts next date as appropriate.)   Kenniyah Sasaki H. Manson Passey, PT, DPT, NCS 07/18/20, 5:15 PM [phone]

## 2020-07-19 ENCOUNTER — Encounter: Payer: Self-pay | Admitting: Emergency Medicine

## 2020-07-19 LAB — CBC
HCT: 32.4 % — ABNORMAL LOW (ref 36.0–46.0)
Hemoglobin: 10.7 g/dL — ABNORMAL LOW (ref 12.0–15.0)
MCH: 30 pg (ref 26.0–34.0)
MCHC: 33 g/dL (ref 30.0–36.0)
MCV: 90.8 fL (ref 80.0–100.0)
Platelets: 219 10*3/uL (ref 150–400)
RBC: 3.57 MIL/uL — ABNORMAL LOW (ref 3.87–5.11)
RDW: 12.7 % (ref 11.5–15.5)
WBC: 7.4 10*3/uL (ref 4.0–10.5)
nRBC: 0 % (ref 0.0–0.2)

## 2020-07-19 NOTE — ED Notes (Signed)
EKG completed due to patient have irregular heart rates noted when on pulse ox.  Pt placed on monitor, shows afib which patient has hx of.  Discussed with Dr. Manson Passey, no additional orders at this time. Will continue to monitor.

## 2020-07-19 NOTE — ED Notes (Signed)
Pt c/o pain in right foot.

## 2020-07-19 NOTE — ED Notes (Signed)
New dinner tray arrived. Set up for pt.

## 2020-07-19 NOTE — TOC Progression Note (Signed)
Transition of Care [facility]) - Progression Note    Patient Details  Name: Debbie Hampton MRN: [id number] Date of Birth: 08-Apr-1932  Transition of Care [facility]) CM/SW Contact  Marina Goodell Phone Number: [phone] 07/19/2020, 2:12 PM  Clinical Narrative:     CSW spoke with patient's son Terryl Niziolek [phone], and updated him on placement issue with insurance.  CSW reminded Mr. Beattie the patient has bed offer at [facility], [place] and [place].  Mr. Reilly stated he would contact the facilities and let this CSW know when he chose one.  CSW spoke with Gavin Pound at [facility], and she stated she would check ins auth to make sure the patient's insurance will be accepted.   Expected Discharge Plan: Skilled Nursing Facility Barriers to Discharge: SNF Pending bed offer  Expected Discharge Plan and Services Expected Discharge Plan: Skilled Nursing Facility In-house Referral: Clinical Social Work   Post Acute Care Choice: Skilled Nursing Facility Living arrangements for the past 2 months: Assisted Living Facility                                       Social Determinants of Health (SDOH) Interventions    Readmission Risk Interventions Readmission Risk Prevention Plan 08/23/2018  Post Dischage Appt Complete  Medication Screening Complete  Transportation Screening Complete  PCP follow-up Complete  Some recent data might be hidden

## 2020-07-19 NOTE — Progress Notes (Signed)
Physical Therapy Treatment Patient Details Name: Debbie Hampton MRN: [id number] DOB: [birth year] Today's Date: 07/19/2020    History of Present Illness presented to ER status post mechanical fall in home environment [facility]) with acute onset of bilat foot/ankle pain; noted with L 5th metatarsal fracture, stabilized in posterior splint, NWB with plans for outpatient orthopedic follow up.    PT Comments    Pt very talkative and difficulty to re-direct during session but she did agree to supine ex as below.  She declined any OOB activity at this time stated she hoped to be discharged to SNF today and did not want to get her ankle hurting for transport.     Follow Up Recommendations  SNF     Equipment Recommendations  Rolling walker with 5" wheels;3in1 (PT)    Recommendations for Other Services       Precautions / Restrictions Precautions Precautions: Fall Restrictions Weight Bearing Restrictions: Yes LLE Weight Bearing: Non weight bearing    Mobility  Bed Mobility               General bed mobility comments: declined  Transfers                    Ambulation/Gait                 Stairs             Wheelchair Mobility    Modified Rankin (Stroke Patients Only)       Balance                                            Cognition Arousal/Alertness: Awake/alert Behavior During Therapy: WFL for tasks assessed/performed Overall Cognitive Status: Within Functional Limits for tasks assessed                                        Exercises Other Exercises Other Exercises: BLE A/AAROM for SLR, heel slides, and ab/add.  ankle pumps RLE    General Comments        Pertinent Vitals/Pain Pain Assessment: Faces Faces Pain Scale: Hurts little more Pain Location: L foot/ankle Pain Descriptors / Indicators: Aching;Guarding;Grimacing Pain Intervention(s): Limited activity within patient's  tolerance;Monitored during session;Repositioned    Home Living                      Prior Function            PT Goals (current goals can now be found in the care plan section) Progress towards PT goals: Progressing toward goals    Frequency    7X/week      PT Plan Current plan remains appropriate    Co-evaluation              AM-PAC PT "6 Clicks" Mobility   Outcome Measure  Help needed turning from your back to your side while in a flat bed without using bedrails?: A Little Help needed moving from lying on your back to sitting on the side of a flat bed without using bedrails?: A Lot Help needed moving to and from a bed to a chair (including a wheelchair)?: Total Help needed standing up from a chair using your arms (e.g., wheelchair or bedside chair)?: A Lot Help needed  to walk in hospital room?: Total Help needed climbing 3-5 steps with a railing? : Total 6 Click Score: 10    End of Session   Activity Tolerance: Patient tolerated treatment well Patient left: in bed;with call bell/phone within reach   Pain - Right/Left: Left Pain - part of body: Ankle and joints of foot     Time: 1423-1435 PT Time Calculation (min) (ACUTE ONLY): 12 min  Charges:  $Therapeutic Exercise: 8-22 mins                    Danielle Dess, PTA 07/19/20, 3:23 PM

## 2020-07-19 NOTE — ED Notes (Signed)
Pt off bedpan, unable to have BM

## 2020-07-19 NOTE — ED Notes (Signed)
Lunch try provided and set up as per pt instructions.

## 2020-07-19 NOTE — ED Notes (Signed)
Dinner delivered. Pt asked for a different order. Dining Services and a individual order placed. Pt repositioned in bed. Coffee and fresh ice water provided.

## 2020-07-19 NOTE — TOC Progression Note (Signed)
Transition of Care [facility]) - Progression Note    Patient Details  Name: Debbie Hampton MRN: [id number] Date of Birth: March 10, 1932  Transition of Care [facility]) CM/SW Contact  Marina Goodell Phone Number: [phone] 07/19/2020, 1:21 PM  Clinical Narrative:      Received call from Thayer Ohm at [facility], patient's insurance is out of network and [facility] cannot take her. CSW reached out to [place] at [facility] for possible placement.  CSW called patient's son and he did not answer.   Expected Discharge Plan: Skilled Nursing Facility Barriers to Discharge: SNF Pending bed offer  Expected Discharge Plan and Services Expected Discharge Plan: Skilled Nursing Facility In-house Referral: Clinical Social Work   Post Acute Care Choice: Skilled Nursing Facility Living arrangements for the past 2 months: Assisted Living Facility                                       Social Determinants of Health (SDOH) Interventions    Readmission Risk Interventions Readmission Risk Prevention Plan 08/23/2018  Post Dischage Appt Complete  Medication Screening Complete  Transportation Screening Complete  PCP follow-up Complete  Some recent data might be hidden

## 2020-07-19 NOTE — ED Notes (Addendum)
Pt ate 90% of salad and 50% of fruit. PT area cleaned up and pt given rag to clean face and hands. PT's hearing aids placed on charger as well as her cellphone per her request

## 2020-07-19 NOTE — ED Notes (Signed)
Pt had a large black liquid BM. Pt cleaned and bedding changed.

## 2020-07-19 NOTE — ED Notes (Signed)
Pt placed on bedpan

## 2020-07-19 NOTE — ED Notes (Signed)
Pt sleeping at this time. Lights dimmed.

## 2020-07-19 NOTE — TOC Progression Note (Signed)
Transition of Care [facility]) - Progression Note    Patient Details  Name: Elayah Klooster MRN: [id number] Date of Birth: 21-Jul-1932  Transition of Care [facility]) CM/SW Contact  Marina Goodell Phone Number:  [phone] 07/19/2020, 12:28 PM  Clinical Narrative:     Patient has bed offer at [facility] pending insurance authorization.  Expected Discharge Plan: Skilled Nursing Facility Barriers to Discharge: SNF Pending bed offer  Expected Discharge Plan and Services Expected Discharge Plan: Skilled Nursing Facility In-house Referral: Clinical Social Work   Post Acute Care Choice: Skilled Nursing Facility Living arrangements for the past 2 months: Assisted Living Facility                                       Social Determinants of Health (SDOH) Interventions    Readmission Risk Interventions Readmission Risk Prevention Plan 08/23/2018  Post Dischage Appt Complete  Medication Screening Complete  Transportation Screening Complete  PCP follow-up Complete  Some recent data might be hidden

## 2020-07-19 NOTE — ED Notes (Signed)
Patient positioned in bed for comfort. HOB raised 45 degrees and knees slightly bent with L leg propped on pillow and splint in place. Pt bed low and locked with side rails raised x1 and bedside table across pt as pt requested to promote ability to grab water. Call bell is in reach and cardiac monitor in place. Pt breathing easy and unlabored speaking in full sentences and with symmetric chest rise and fall. Pt denies further needs at this time, RN to continue to monitor.

## 2020-07-19 NOTE — ED Notes (Signed)
Pt reports no decrease in her discomfort.

## 2020-07-19 NOTE — ED Notes (Signed)
Pt resting in bed.

## 2020-07-20 NOTE — ED Notes (Signed)
Report to transport at this time. Patient belongings (clothes/hearaid/hearingaid charger/shoes/cellphone) sent with patient.

## 2020-07-20 NOTE — TOC Progression Note (Addendum)
Transition of Care [facility]) - Progression Note    Patient Details  Name: Eugina Row MRN: [id number] Date of Birth: 1932/02/26  Transition of Care [facility]) CM/SW Contact  Marina Goodell Phone Number:  [phone] 07/20/2020, 1:13 PM  Clinical Narrative:     Patient will d/c to [facility] in [place] Room 204, report [phone].  [facility] will transport at 4:00PM. EDP and Staff notified.  Patient's son Makalya Nave notified. TOC consult complete.   Expected Discharge Plan: Skilled Nursing Facility Barriers to Discharge: SNF Pending bed offer  Expected Discharge Plan and Services Expected Discharge Plan: Skilled Nursing Facility In-house Referral: Clinical Social Work   Post Acute Care Choice: Skilled Nursing Facility Living arrangements for the past 2 months: Assisted Living Facility                                       Social Determinants of Health (SDOH) Interventions    Readmission Risk Interventions Readmission Risk Prevention Plan 08/23/2018  Post Dischage Appt Complete  Medication Screening Complete  Transportation Screening Complete  PCP follow-up Complete  Some recent data might be hidden

## 2020-07-20 NOTE — TOC Progression Note (Signed)
Transition of Care [facility]) - Progression Note    Patient Details  Name: Saanvika Vazques MRN: [id number] Date of Birth: 11-23-1931  Transition of Care [facility]) CM/SW Contact  Joseph Art, [place] Phone Number: 07/20/2020, 12:07 PM  Clinical Narrative:     Possible placement at [facility], pending insurance REF# [id number].  Expected Discharge Plan: Skilled Nursing Facility Barriers to Discharge: SNF Pending bed offer  Expected Discharge Plan and Services Expected Discharge Plan: Skilled Nursing Facility In-house Referral: Clinical Social Work   Post Acute Care Choice: Skilled Nursing Facility Living arrangements for the past 2 months: Assisted Living Facility                                       Social Determinants of Health (SDOH) Interventions    Readmission Risk Interventions Readmission Risk Prevention Plan 08/23/2018  Post Dischage Appt Complete  Medication Screening Complete  Transportation Screening Complete  PCP follow-up Complete  Some recent data might be hidden

## 2020-07-20 NOTE — ED Provider Notes (Signed)
-----------------------------------------   5:23 AM on 07/20/2020 -----------------------------------------   Blood pressure (!) 110/56, pulse 66, temperature 98.3 F (36.8 C), temperature source Oral, resp. rate 10, SpO2 97 %.  The patient is sleeping at this time.  There have been no acute events since the last update.  Awaiting disposition plan from Social Work team.   Irean Hong, MD 07/20/20 [phone]

## 2020-07-20 NOTE — ED Notes (Signed)
Lunch and supper order placed at this time.

## 2021-04-13 ENCOUNTER — Emergency Department
Admission: EM | Admit: 2021-04-13 | Discharge: 2021-04-14 | Disposition: A | Discharge status: Home / Self Care | Payer: Medicare Other | Attending: Emergency Medicine | Admitting: Emergency Medicine

## 2021-04-13 DIAGNOSIS — I4891 Unspecified atrial fibrillation: Secondary | ICD-10-CM | POA: Diagnosis not present

## 2021-04-13 DIAGNOSIS — I10 Essential (primary) hypertension: Secondary | ICD-10-CM

## 2021-04-13 DIAGNOSIS — X58XXXA Exposure to other specified factors, initial encounter: Secondary | ICD-10-CM | POA: Insufficient documentation

## 2021-04-13 DIAGNOSIS — S39012A Strain of muscle, fascia and tendon of lower back, initial encounter: Secondary | ICD-10-CM | POA: Diagnosis not present

## 2021-04-13 DIAGNOSIS — Z79899 Other long term (current) drug therapy: Secondary | ICD-10-CM | POA: Diagnosis not present

## 2021-04-13 DIAGNOSIS — Z7901 Long term (current) use of anticoagulants: Secondary | ICD-10-CM | POA: Insufficient documentation

## 2021-04-13 DIAGNOSIS — Z87891 Personal history of nicotine dependence: Secondary | ICD-10-CM | POA: Diagnosis not present

## 2021-04-13 DIAGNOSIS — S3992XA Unspecified injury of lower back, initial encounter: Secondary | ICD-10-CM | POA: Diagnosis present

## 2021-04-13 NOTE — ED Triage Notes (Signed)
Pt presents via EMS c/o HTN and lower back pain. Pt from [facility]

## 2021-04-14 LAB — CBC
HCT: 34 % — ABNORMAL LOW (ref 36.0–46.0)
Hemoglobin: 11.3 g/dL — ABNORMAL LOW (ref 12.0–15.0)
MCH: 29.3 pg (ref 26.0–34.0)
MCHC: 33.2 g/dL (ref 30.0–36.0)
MCV: 88.1 fL (ref 80.0–100.0)
Platelets: 238 10*3/uL (ref 150–400)
RBC: 3.86 MIL/uL — ABNORMAL LOW (ref 3.87–5.11)
RDW: 13.3 % (ref 11.5–15.5)
WBC: 8.5 10*3/uL (ref 4.0–10.5)
nRBC: 0 % (ref 0.0–0.2)

## 2021-04-14 LAB — BASIC METABOLIC PANEL
Anion gap: 5 (ref 5–15)
BUN: 12 mg/dL (ref 8–23)
CO2: 27 mmol/L (ref 22–32)
Calcium: 9.4 mg/dL (ref 8.9–10.3)
Chloride: 98 mmol/L (ref 98–111)
Creatinine, Ser: 0.79 mg/dL (ref 0.44–1.00)
GFR, Estimated: >60 mL/min (ref >60)
Glucose, Bld: 119 mg/dL — ABNORMAL HIGH (ref 70–99)
Potassium: 3.9 mmol/L (ref 3.5–5.1)
Sodium: 130 mmol/L — ABNORMAL LOW (ref 135–145)

## 2021-04-14 LAB — URINALYSIS, ROUTINE W REFLEX MICROSCOPIC
Bilirubin Urine: NEGATIVE
Glucose, UA: NEGATIVE mg/dL
Ketones, ur: NEGATIVE mg/dL
Leukocytes,Ua: NEGATIVE
Nitrite: NEGATIVE
Protein, ur: NEGATIVE mg/dL
Specific Gravity, Urine: 1.003 — ABNORMAL LOW (ref 1.005–1.030)
WBC, UA: NONE SEEN WBC/hpf (ref 0–5)
pH: 8 (ref 5.0–8.0)

## 2021-04-14 LAB — PROTIME-INR
INR: 1.1 (ref 0.8–1.2)
Prothrombin Time: 14.5 seconds (ref 11.4–15.2)

## 2021-04-14 MED ORDER — TRAMADOL HCL 50 MG PO TABS
50.0000 mg | ORAL_TABLET | Freq: Once | ORAL | Status: AC
  Administered 2021-04-14: 50 mg via ORAL
  Filled 2021-04-14: qty 1

## 2021-04-14 NOTE — ED Notes (Signed)
Pt. Provided ice water, with permission from MD. Pt. And son updated on POC.

## 2021-04-14 NOTE — ED Notes (Signed)
CALLED EMS FOR TRANSPORT BACK TO [facility]

## 2021-04-14 NOTE — Discharge Instructions (Addendum)

## 2021-04-14 NOTE — ED Notes (Signed)
ED Provider at bedside. 

## 2021-04-14 NOTE — ED Provider Notes (Signed)
[facility] Emergency Department Provider Note  ____________________________________________   Event Date/Time   First MD Initiated Contact with Patient 04/13/21 2327     (approximate)  I have reviewed the triage vital signs and the nursing notes.   HISTORY  Chief Complaint Hypertension and Back Pain    HPI Debbie Hampton is a 87 y.o. female who presents by EMS for evaluation of her hypertension as well as for low back pain.  The patient is at a facility and has been doing physical therapy.  She developed low back pain tonight that is just right of center on the bottom of her back.  Moving around makes it worse but she says she is not in pain if she lies still.  However her blood pressure was high, apparently going up as high as 250 systolic, which her adult son confirmed because he was with her and slightly elevated blood pressure.  He is very concerned because she has had issues with labile blood pressure in the past and has been admitted for her blood pressure but then had a lot of issues with her blood pressure dropping too low.  She previously was on multiple blood pressure agents but her primary care doctor, Dr. Madelin Rear, has taken her off of everything except for lisinopril and atenolol.  She no longer takes hydralazine 50 mg 3 times a day as she used to.  She has been getting her prescribed medications regularly from her facility.  She reports that she feels okay, her back just hurts and it is a dull and aching pain that can be severe when she moves around.  No recent falls or other trauma.  She has no pain when she urinates.  She denies fever, sore throat, chest pain, shortness of breath, nausea, vomiting, and abdominal pain.  She has no headache.     Past Medical History:  Diagnosis Date   Atrial fibrillation (HCC)    Bilateral shoulder pain    Colon polyp    Constipation    GERD (gastroesophageal reflux disease)    Hypertension    Impaired fasting  glucose    Syncope 03/2016   with [facility]   Varicose veins of both lower extremities     Patient Active Problem List   Diagnosis Date Noted   Atrial fibrillation (HCC)    Hypertension    Elevated troponin 11/26/2019   Labile blood pressure    Benign essential HTN    Hypertensive crisis    Hyponatremia    Prediabetes    Hypoalbuminemia due to protein-calorie malnutrition (HCC)    Acute blood loss anemia    Stroke (cerebrum) (HCC) 08/01/2017   Acute CVA (cerebrovascular accident) (HCC) 07/29/2017   Syncope, near 03/31/2016    Past Surgical History:  Procedure Laterality Date   TONSILLECTOMY     TUMOR REMOVAL     ovaries    Prior to Admission medications   Medication Sig Start Date End Date Taking? Authorizing Provider  acetaminophen (TYLENOL) 500 MG tablet Take 500 mg by mouth every 8 (eight) hours as needed.    [provider]  apixaban (ELIQUIS) 2.5 MG TABS tablet Take 2.5 mg by mouth every 12 (twelve) hours. 12/20/19   [provider]  atenolol (TENORMIN) 50 MG tablet Take 1 tablet (50 mg total) by mouth daily. 08/23/18   Adrian Saran, MD  Cholecalciferol (VITAMIN D) 2000 units tablet Take 2,000 Units by mouth daily.     [provider]  citalopram (CELEXA)  20 MG tablet Take 20 mg by mouth daily.     [provider]  clobetasol cream (TEMOVATE) 0.05 % Apply 1 application topically 2 (two) times daily as needed (rash or itching). (apply to feet, ankles, arms, back, buttocks and perineal area)    [provider]  famotidine (PEPCID) 40 MG tablet Take 40 mg by mouth at bedtime. 06/27/20   [provider]  hydrALAZINE (APRESOLINE) 50 MG tablet Take 1 tablet (50 mg total) by mouth 3 (three) times daily. 08/13/17   Love, Evlyn Kanner, PA-C  iron polysaccharides (POLY-IRON 150) 150 MG capsule Take 150 mg by mouth daily.    [provider]  lisinopril (ZESTRIL) 20 MG tablet Take 20 mg by mouth daily. 06/27/20   [provider]  mometasone (ELOCON) 0.1 % lotion Apply 4 drops topically at bedtime as needed for itching or dry skin. Place into each ear. 05/12/20   [provider]  Multiple Vitamins-Minerals (CERTAVITE/ANTIOXIDANTS) TABS Take 1 tablet by mouth daily.    [provider]  omega-3 acid ethyl esters (LOVAZA) 1 g capsule Take 1 g by mouth 2 (two) times daily.    [provider]  pantoprazole (PROTONIX) 40 MG tablet Take 1 tablet (40 mg total) by mouth daily. 08/14/17   Love, Evlyn Kanner, PA-C  Polyethyl Glycol-Propyl Glycol (SYSTANE ULTRA) 0.4-0.3 % SOLN Place 1 drop into both eyes 3 (three) times daily as needed.     [provider]  polyethylene glycol (MIRALAX / GLYCOLAX) packet Take 17 g by mouth daily. Patient taking differently: Take 17 g by mouth daily.  08/14/17   Love, Evlyn Kanner, PA-C  potassium chloride (K-DUR,KLOR-CON) 10 MEQ tablet Take 1 tablet (10 mEq total) by mouth 2 (two) times daily. 08/13/17   Love, Evlyn Kanner, PA-C  rosuvastatin (CRESTOR) 20 MG tablet Take 20 mg by mouth at bedtime.    [provider]  sodium chloride (OCEAN) 0.65 % SOLN nasal spray Place 1 spray into both nostrils 3 (three) times daily as needed for congestion.     [provider]  spironolactone (ALDACTONE) 25 MG tablet Take 1 tablet (25 mg total) by mouth daily. 08/24/18   Adrian Saran, MD    Allergies Cheese, Mushroom extract complex, Penicillins, Verapamil, Aloe, Amlodipine, Cephalexin, Metoprolol, Miconazole, Neosporin [neomycin-bacitracin zn-polymyx], and Trandolapril-verapamil hcl er  Family History  Problem Relation Age of Onset   Hypertension Other    Breast cancer Sister    Kidney cancer Neg Hx    Kidney disease Neg Hx    Prostate cancer Neg Hx     Social History Social History   Tobacco Use   Smoking status: Former    Types: Cigarettes   Smokeless tobacco: Never   Tobacco comments:    quit 1985  Substance Use Topics   Alcohol use: No   Drug use: No     Review of Systems Constitutional: No fever/chills Eyes: No visual changes. ENT: No sore throat. Cardiovascular: Denies chest pain. Respiratory: Denies shortness of breath. Gastrointestinal: No abdominal pain.  No nausea, no vomiting.  No diarrhea.  No constipation. Genitourinary: Negative for dysuria. Musculoskeletal: Right-sided low back pain, nonradiating. Integumentary: Negative for rash. Neurological: Negative for headaches, focal weakness or numbness.   ____________________________________________   PHYSICAL EXAM:  VITAL SIGNS: ED Triage Vitals [04/13/21 2311]  Enc Vitals Group     BP (!) 211/102     Pulse Rate 80     Resp 16     Temp  98.4 F (36.9 C)     Temp Source Oral     SpO2 97 %     Weight      Height      Head Circumference      [facility] Flow      Pain Score      Pain Loc      Pain Edu?      Excl. in GC?     Constitutional: Alert and oriented.  Eyes: Conjunctivae are normal.  Head: Atraumatic. Nose: No congestion/rhinnorhea. Mouth/Throat: Patient is wearing a mask. Neck: No stridor.  No meningeal signs.   Cardiovascular: Normal rate, regular rhythm. Good peripheral circulation. Respiratory: Normal respiratory effort.  No retractions. Gastrointestinal: Soft and nontender. No distention.  Musculoskeletal: No gross deformities of her extremities nor of her lumbar spine.  She has some soft tissue tenderness to palpation in the right lower back without obvious gross deformity, ecchymosis, etc. Neurologic:  Normal speech and language. No gross focal neurologic deficits are appreciated.  Skin:  Skin is warm, dry and intact. Psychiatric: Mood and affect are normal. Speech and behavior are normal.  ____________________________________________   LABS (all labs ordered are listed, but only abnormal results are displayed)  Labs Reviewed  BASIC METABOLIC PANEL - Abnormal; Notable for the following components:      Result Value   Sodium 130 (*)     Glucose, Bld 119 (*)    All other components within normal limits  CBC - Abnormal; Notable for the following components:   RBC 3.86 (*)    Hemoglobin 11.3 (*)    HCT 34.0 (*)    All other components within normal limits  URINALYSIS, ROUTINE W REFLEX MICROSCOPIC - Abnormal; Notable for the following components:   Color, Urine COLORLESS (*)    APPearance CLEAR (*)    Specific Gravity, Urine 1.003 (*)    Hgb urine dipstick SMALL (*)    Bacteria, UA RARE (*)    All other components within normal limits  PROTIME-INR   ____________________________________________  EKG  ED ECG REPORT I, Loleta Roseory Orel Hord, the attending physician, personally viewed and interpreted this ECG.  Date: 04/13/2021 EKG Time: 23: 57 Rate: 79 Rhythm: Atrial fibrillation QRS Axis: Borderline right axis deviation Intervals: Prolonged QTC at 512 ms with probable left ventricular hypertrophy ST/T Wave abnormalities: Non-specific ST segment / T-wave changes, but no clear evidence of acute ischemia. Narrative Interpretation: no definitive evidence of acute ischemia; does not meet STEMI criteria.  ____________________________________________   PROCEDURES   Procedure(s) performed (including Critical Care):  .1-3 Lead EKG Interpretation  Date/Time: 04/14/2021 2:17 AM Performed by: Loleta RoseForbach, Minna Dumire, MD Authorized by: Loleta RoseForbach, Jeryl Umholtz, MD     Interpretation: normal     ECG rate:  75   ECG rate assessment: normal     Rhythm: sinus rhythm     Ectopy: none     Conduction: normal     ____________________________________________   INITIAL IMPRESSION / MDM / ASSESSMENT AND PLAN / ED COURSE  As part of my medical decision making, I reviewed the following data within the electronic MEDICAL RECORD NUMBER History obtained from family, Nursing notes reviewed and incorporated, Labs reviewed , EKG interpreted , Old chart reviewed, and Notes from prior ED visits   Differential diagnosis includes, but is not limited to, primary  hypertension, hypertension secondary to pain, musculoskeletal strain from physical therapy, UTI/pyelonephritis, lumbar disc disease, arthritis.  The patient is on the cardiac monitor to evaluate for evidence of arrhythmia and/or significant  heart rate changes.  Patient's vital signs show labile blood pressure as previously documented; initially she was quite hypertensive and that her blood pressure came down to the 180s which was certainly an improvement, particularly over the systolic of 250 witnessed by her son.  However that it has trended back up in spite of her sleeping.  Vital signs otherwise unremarkable.  She has no chest pain or shortness of breath, no evidence of encephalopathy, no visual changes.  Basic metabolic panel is reassuring with stable hyponatremia and normal kidney function.  CBC is normal.  Urinalysis is pending.  Patient is on Eliquis and her coags are unremarkable.  I talked with the son and the patient.  They are both very reluctant to give her any antihypertensives which I understand given the prior experience.  We decided to try and address the pain in her back to see if this would have a drop in her blood pressure.  They are also concerned because she has been constipated recently and opioids tend to constipate her a lot.  I spoke with pharmacy who confirmed the tramadol has less of a constipating effect than other analgesics so I give a dose of tramadol 50 mg and we will monitor.     Clinical Course as of 04/14/21 0409  Sat Apr 14, 2021  0209 Urinalysis, Routine w reflex microscopic(!) Reassuring urinalysis with no evidence of infection. [CF]  0407 Patient's blood pressures come down to 161/80.  She said her back feels better.  She has been resting.  Her son is comfortable with the plan for discharge without additional intervention and I think that is very appropriate.  I gave my usual and customary management recommendations and return precautions. [CF]    Clinical  Course User Index [CF] Loleta Rose, MD     ____________________________________________  FINAL CLINICAL IMPRESSION(S) / ED DIAGNOSES  Final diagnoses:  Strain of lumbar region, initial encounter  Hypertension, unspecified type     MEDICATIONS GIVEN DURING THIS VISIT:  Medications  traMADol (ULTRAM) tablet 50 mg (50 mg Oral Given 04/14/21 0129)     ED Discharge Orders     None        Note:  This document was prepared using Dragon voice recognition software and may include unintentional dictation errors.   Loleta Rose, MD 04/14/21 [phone]

## 2021-04-14 NOTE — ED Notes (Signed)
Pt. Resting in bed, eyes closed, chest rise and fall. Pt's son at bedside. Updated on POC. NAD.

## 2021-05-06 ENCOUNTER — Emergency Department: Payer: Medicare Other

## 2021-05-06 ENCOUNTER — Inpatient Hospital Stay
Admission: EM | Admit: 2021-05-06 | Discharge: 2021-05-09 | DRG: 552 | Disposition: A | Discharge status: Skilled Nursing Facility | Payer: Medicare Other | Source: Skilled Nursing Facility | Attending: Internal Medicine | Admitting: Internal Medicine

## 2021-05-06 ENCOUNTER — Encounter: Payer: Self-pay | Admitting: Internal Medicine

## 2021-05-06 ENCOUNTER — Other Ambulatory Visit: Payer: Self-pay

## 2021-05-06 DIAGNOSIS — S22080K Wedge compression fracture of T11-T12 vertebra, subsequent encounter for fracture with nonunion: Secondary | ICD-10-CM | POA: Diagnosis not present

## 2021-05-06 DIAGNOSIS — Z66 Do not resuscitate: Secondary | ICD-10-CM | POA: Diagnosis present

## 2021-05-06 DIAGNOSIS — Z20822 Contact with and (suspected) exposure to covid-19: Secondary | ICD-10-CM | POA: Diagnosis present

## 2021-05-06 DIAGNOSIS — S22080A Wedge compression fracture of T11-T12 vertebra, initial encounter for closed fracture: Secondary | ICD-10-CM | POA: Diagnosis not present

## 2021-05-06 DIAGNOSIS — Z888 Allergy status to other drugs, medicaments and biological substances status: Secondary | ICD-10-CM

## 2021-05-06 DIAGNOSIS — R778 Other specified abnormalities of plasma proteins: Secondary | ICD-10-CM | POA: Diagnosis present

## 2021-05-06 DIAGNOSIS — S22000A Wedge compression fracture of unspecified thoracic vertebra, initial encounter for closed fracture: Secondary | ICD-10-CM | POA: Diagnosis present

## 2021-05-06 DIAGNOSIS — Z79899 Other long term (current) drug therapy: Secondary | ICD-10-CM

## 2021-05-06 DIAGNOSIS — I10 Essential (primary) hypertension: Secondary | ICD-10-CM

## 2021-05-06 DIAGNOSIS — Z880 Allergy status to penicillin: Secondary | ICD-10-CM

## 2021-05-06 DIAGNOSIS — Z8673 Personal history of transient ischemic attack (TIA), and cerebral infarction without residual deficits: Secondary | ICD-10-CM

## 2021-05-06 DIAGNOSIS — I4891 Unspecified atrial fibrillation: Secondary | ICD-10-CM | POA: Diagnosis present

## 2021-05-06 DIAGNOSIS — F0390 Unspecified dementia without behavioral disturbance: Secondary | ICD-10-CM | POA: Diagnosis present

## 2021-05-06 DIAGNOSIS — I119 Hypertensive heart disease without heart failure: Secondary | ICD-10-CM | POA: Diagnosis present

## 2021-05-06 DIAGNOSIS — N179 Acute kidney failure, unspecified: Secondary | ICD-10-CM | POA: Diagnosis not present

## 2021-05-06 DIAGNOSIS — Z881 Allergy status to other antibiotic agents status: Secondary | ICD-10-CM

## 2021-05-06 DIAGNOSIS — Z993 Dependence on wheelchair: Secondary | ICD-10-CM

## 2021-05-06 DIAGNOSIS — K219 Gastro-esophageal reflux disease without esophagitis: Secondary | ICD-10-CM | POA: Diagnosis present

## 2021-05-06 DIAGNOSIS — E871 Hypo-osmolality and hyponatremia: Secondary | ICD-10-CM | POA: Diagnosis not present

## 2021-05-06 DIAGNOSIS — Z87891 Personal history of nicotine dependence: Secondary | ICD-10-CM

## 2021-05-06 DIAGNOSIS — W050XXA Fall from non-moving wheelchair, initial encounter: Secondary | ICD-10-CM | POA: Diagnosis present

## 2021-05-06 DIAGNOSIS — Z91018 Allergy to other foods: Secondary | ICD-10-CM

## 2021-05-06 DIAGNOSIS — I160 Hypertensive urgency: Secondary | ICD-10-CM | POA: Diagnosis present

## 2021-05-06 DIAGNOSIS — Z7901 Long term (current) use of anticoagulants: Secondary | ICD-10-CM

## 2021-05-06 DIAGNOSIS — E039 Hypothyroidism, unspecified: Secondary | ICD-10-CM | POA: Diagnosis present

## 2021-05-06 DIAGNOSIS — Z8249 Family history of ischemic heart disease and other diseases of the circulatory system: Secondary | ICD-10-CM

## 2021-05-06 LAB — CBC WITH DIFFERENTIAL/PLATELET
Abs Immature Granulocytes: 0.03 10*3/uL (ref 0.00–0.07)
Basophils Absolute: 0.1 10*3/uL (ref 0.0–0.1)
Basophils Relative: 1 %
Eosinophils Absolute: 0.1 10*3/uL (ref 0.0–0.5)
Eosinophils Relative: 2 %
HCT: 36.7 % (ref 36.0–46.0)
Hemoglobin: 12.2 g/dL (ref 12.0–15.0)
Immature Granulocytes: 0 %
Lymphocytes Relative: 19 %
Lymphs Abs: 1.7 10*3/uL (ref 0.7–4.0)
MCH: 28.7 pg (ref 26.0–34.0)
MCHC: 33.2 g/dL (ref 30.0–36.0)
MCV: 86.4 fL (ref 80.0–100.0)
Monocytes Absolute: 0.9 10*3/uL (ref 0.1–1.0)
Monocytes Relative: 9 %
Neutro Abs: 6.5 10*3/uL (ref 1.7–7.7)
Neutrophils Relative %: 69 %
Platelets: 256 10*3/uL (ref 150–400)
RBC: 4.25 MIL/uL (ref 3.87–5.11)
RDW: 13.7 % (ref 11.5–15.5)
WBC: 9.3 10*3/uL (ref 4.0–10.5)
nRBC: 0 % (ref 0.0–0.2)

## 2021-05-06 LAB — URINALYSIS, COMPLETE (UACMP) WITH MICROSCOPIC
Bilirubin Urine: NEGATIVE
Glucose, UA: NEGATIVE mg/dL
Ketones, ur: NEGATIVE mg/dL
Nitrite: NEGATIVE
Protein, ur: NEGATIVE mg/dL
Specific Gravity, Urine: 1.015 (ref 1.005–1.030)
pH: 7 (ref 5.0–8.0)

## 2021-05-06 LAB — RESP PANEL BY RT-PCR (FLU A&B, COVID) ARPGX2
Influenza A by PCR: NEGATIVE
Influenza B by PCR: NEGATIVE
SARS Coronavirus 2 by RT PCR: NEGATIVE

## 2021-05-06 LAB — BASIC METABOLIC PANEL
Anion gap: 3 — ABNORMAL LOW (ref 5–15)
BUN: 18 mg/dL (ref 8–23)
CO2: 29 mmol/L (ref 22–32)
Calcium: 10 mg/dL (ref 8.9–10.3)
Chloride: 100 mmol/L (ref 98–111)
Creatinine, Ser: 0.93 mg/dL (ref 0.44–1.00)
GFR, Estimated: 60 mL/min — ABNORMAL LOW (ref >60)
Glucose, Bld: 114 mg/dL — ABNORMAL HIGH (ref 70–99)
Potassium: 4.1 mmol/L (ref 3.5–5.1)
Sodium: 132 mmol/L — ABNORMAL LOW (ref 135–145)

## 2021-05-06 LAB — TROPONIN I (HIGH SENSITIVITY)
Troponin I (High Sensitivity): 21 ng/L — ABNORMAL HIGH (ref <18)
Troponin I (High Sensitivity): 20 ng/L — ABNORMAL HIGH (ref <18)

## 2021-05-06 LAB — CK: Total CK: 65 U/L (ref 38–234)

## 2021-05-06 MED ORDER — VITAMIN D3 25 MCG (1000 UNIT) PO TABS
2000.0000 [IU] | ORAL_TABLET | Freq: Every day | ORAL | Status: DC
  Administered 2021-05-07 – 2021-05-09 (×3): 2000 [IU] via ORAL
  Filled 2021-05-06 (×7): qty 2

## 2021-05-06 MED ORDER — HYDROCODONE-ACETAMINOPHEN 5-325 MG PO TABS
2.0000 | ORAL_TABLET | Freq: Four times a day (QID) | ORAL | Status: DC | PRN
  Administered 2021-05-06 – 2021-05-07 (×4): 2 via ORAL
  Filled 2021-05-06 (×4): qty 2

## 2021-05-06 MED ORDER — ONDANSETRON HCL 4 MG/2ML IJ SOLN
4.0000 mg | Freq: Four times a day (QID) | INTRAMUSCULAR | Status: DC | PRN
  Administered 2021-05-07: 4 mg via INTRAVENOUS
  Filled 2021-05-06: qty 2

## 2021-05-06 MED ORDER — LISINOPRIL 10 MG PO TABS
10.0000 mg | ORAL_TABLET | Freq: Every day | ORAL | Status: DC
  Administered 2021-05-07 – 2021-05-08 (×2): 10 mg via ORAL
  Filled 2021-05-06 (×2): qty 1

## 2021-05-06 MED ORDER — GERHARDT'S BUTT CREAM
TOPICAL_CREAM | Freq: Three times a day (TID) | CUTANEOUS | Status: DC
  Filled 2021-05-06 (×2): qty 1

## 2021-05-06 MED ORDER — ATENOLOL 50 MG PO TABS
50.0000 mg | ORAL_TABLET | Freq: Every day | ORAL | Status: DC
  Administered 2021-05-07 – 2021-05-08 (×2): 50 mg via ORAL
  Filled 2021-05-06 (×2): qty 1

## 2021-05-06 MED ORDER — POLYETHYL GLYCOL-PROPYL GLYCOL 0.4-0.3 % OP SOLN: 1.0000 [drp] | Freq: Three times a day (TID) | OPHTHALMIC | Status: DC | PRN

## 2021-05-06 MED ORDER — CITALOPRAM HYDROBROMIDE 20 MG PO TABS
20.0000 mg | ORAL_TABLET | Freq: Every day | ORAL | Status: DC
  Administered 2021-05-07 – 2021-05-08 (×2): 20 mg via ORAL
  Filled 2021-05-06 (×2): qty 1

## 2021-05-06 MED ORDER — ONDANSETRON HCL 4 MG PO TABS: 4.0000 mg | ORAL_TABLET | Freq: Four times a day (QID) | ORAL | Status: DC | PRN

## 2021-05-06 MED ORDER — POLYETHYLENE GLYCOL 3350 17 G PO PACK
17.0000 g | PACK | Freq: Every day | ORAL | Status: DC
  Administered 2021-05-07 – 2021-05-09 (×3): 17 g via ORAL
  Filled 2021-05-06 (×3): qty 1

## 2021-05-06 MED ORDER — HYDRALAZINE HCL 20 MG/ML IJ SOLN
5.0000 mg | Freq: Once | INTRAMUSCULAR | Status: AC
  Administered 2021-05-06: 5 mg via INTRAVENOUS
  Filled 2021-05-06: qty 1

## 2021-05-06 MED ORDER — HYDRALAZINE HCL 25 MG PO TABS: 25.0000 mg | ORAL_TABLET | Freq: Four times a day (QID) | ORAL | Status: DC | PRN

## 2021-05-06 MED ORDER — OMEGA-3-ACID ETHYL ESTERS 1 G PO CAPS
1.0000 g | ORAL_CAPSULE | Freq: Two times a day (BID) | ORAL | Status: DC
  Filled 2021-05-06 (×2): qty 1

## 2021-05-06 MED ORDER — ROSUVASTATIN CALCIUM 10 MG PO TABS
20.0000 mg | ORAL_TABLET | Freq: Every day | ORAL | Status: DC
  Administered 2021-05-06 – 2021-05-08 (×3): 20 mg via ORAL
  Filled 2021-05-06 (×2): qty 2
  Filled 2021-05-06: qty 1
  Filled 2021-05-06: qty 2

## 2021-05-06 MED ORDER — AMLODIPINE BESYLATE 5 MG PO TABS: 2.5000 mg | ORAL_TABLET | Freq: Every day | ORAL | Status: DC

## 2021-05-06 MED ORDER — ACETAMINOPHEN 500 MG PO TABS
1000.0000 mg | ORAL_TABLET | Freq: Once | ORAL | Status: AC
  Administered 2021-05-06: 1000 mg via ORAL
  Filled 2021-05-06: qty 2

## 2021-05-06 MED ORDER — APIXABAN 2.5 MG PO TABS
2.5000 mg | ORAL_TABLET | Freq: Two times a day (BID) | ORAL | Status: DC
  Administered 2021-05-06 – 2021-05-09 (×6): 2.5 mg via ORAL
  Filled 2021-05-06 (×7): qty 1

## 2021-05-06 MED ORDER — PANTOPRAZOLE SODIUM 40 MG PO TBEC
40.0000 mg | DELAYED_RELEASE_TABLET | Freq: Every day | ORAL | Status: DC
  Administered 2021-05-07 – 2021-05-09 (×3): 40 mg via ORAL
  Filled 2021-05-06 (×3): qty 1

## 2021-05-06 MED ORDER — POLYSACCHARIDE IRON COMPLEX 150 MG PO CAPS
150.0000 mg | ORAL_CAPSULE | Freq: Every day | ORAL | Status: DC
  Administered 2021-05-07 – 2021-05-09 (×3): 150 mg via ORAL
  Filled 2021-05-06 (×3): qty 1

## 2021-05-06 MED ORDER — ACETAMINOPHEN 500 MG PO TABS
500.0000 mg | ORAL_TABLET | Freq: Three times a day (TID) | ORAL | Status: DC | PRN
  Administered 2021-05-07: 500 mg via ORAL
  Filled 2021-05-06: qty 1

## 2021-05-06 MED ORDER — OMEGA-3-ACID ETHYL ESTERS 1 G PO CAPS
1.0000 g | ORAL_CAPSULE | Freq: Two times a day (BID) | ORAL | Status: DC
  Administered 2021-05-07 – 2021-05-09 (×4): 1 g via ORAL
  Filled 2021-05-06 (×5): qty 1

## 2021-05-06 MED ORDER — TRAMADOL HCL 50 MG PO TABS
50.0000 mg | ORAL_TABLET | Freq: Two times a day (BID) | ORAL | Status: DC | PRN
Start: 2021-05-06 — End: 2021-05-06
  Administered 2021-05-06: 50 mg via ORAL
  Filled 2021-05-06: qty 1

## 2021-05-06 MED ORDER — POLYVINYL ALCOHOL 1.4 % OP SOLN
1.0000 [drp] | OPHTHALMIC | Status: DC | PRN
  Filled 2021-05-06: qty 15

## 2021-05-06 MED ORDER — CYCLOBENZAPRINE HCL 10 MG PO TABS: 5.0000 mg | ORAL_TABLET | Freq: Three times a day (TID) | ORAL | Status: DC | PRN

## 2021-05-06 MED ORDER — TIZANIDINE HCL 2 MG PO TABS
2.0000 mg | ORAL_TABLET | Freq: Two times a day (BID) | ORAL | Status: DC
  Administered 2021-05-06 – 2021-05-09 (×6): 2 mg via ORAL
  Filled 2021-05-06 (×9): qty 1

## 2021-05-06 MED ORDER — LISINOPRIL 10 MG PO TABS
10.0000 mg | ORAL_TABLET | Freq: Once | ORAL | Status: AC
  Administered 2021-05-06: 10 mg via ORAL
  Filled 2021-05-06: qty 1

## 2021-05-06 MED ORDER — ASPIRIN 81 MG PO CHEW
324.0000 mg | CHEWABLE_TABLET | Freq: Once | ORAL | Status: AC
  Administered 2021-05-06: 324 mg via ORAL
  Filled 2021-05-06: qty 4

## 2021-05-06 MED ORDER — LIDOCAINE 5 % EX PTCH
1.0000 | MEDICATED_PATCH | CUTANEOUS | Status: DC
  Administered 2021-05-06 – 2021-05-09 (×4): 1 via TRANSDERMAL
  Filled 2021-05-06 (×4): qty 1

## 2021-05-06 MED ORDER — DERMACLOUD EX OINT: TOPICAL_OINTMENT | Freq: Three times a day (TID) | CUTANEOUS | Status: DC

## 2021-05-06 MED ORDER — FUROSEMIDE 20 MG PO TABS
20.0000 mg | ORAL_TABLET | Freq: Every day | ORAL | Status: DC
  Administered 2021-05-07 – 2021-05-08 (×2): 20 mg via ORAL
  Filled 2021-05-06 (×2): qty 1

## 2021-05-06 NOTE — ED Notes (Signed)
Pt remains sleeping in room, not woken up at this time. Easy chest rise and fall

## 2021-05-06 NOTE — ED Notes (Signed)
Breakfast tray left at bedside, pt sleeping

## 2021-05-06 NOTE — Progress Notes (Signed)
Orthopedic Tech Progress Note Patient Details:  Debbie Hampton Mar 22, 1932 [id number]  Called in TLSO brace order to [facility] @ 1623.  [name], female   DOB: [birth year], 87 y.o.   MRN: [id number]  Docia Furl 05/06/2021, 4:24 PM

## 2021-05-06 NOTE — Plan of Care (Signed)

## 2021-05-06 NOTE — Consult Note (Signed)
Referring Physician:  No referring provider defined for this encounter.  Primary Physician:  Pcp, No  Chief Complaint:  back pain  History of Present Illness: 05/06/2021 Debbie Hampton is a 87 y.o. female who presents with the chief complaint of back pain after a slide from her wheelchair.  She slid out of her wheelchair due to difficulty with mobility.  She has no new neurological symptoms.    She is able to give most history, and reports a hard sit onto the toilet as well as sliding out of her wheelchair both within the last day.   Review of Systems:  A 10 point review of systems is negative, except for the pertinent positives and negatives detailed in the HPI.  Past Medical History: Past Medical History:  Diagnosis Date   Atrial fibrillation (HCC)    Bilateral shoulder pain    Colon polyp    Constipation    GERD (gastroesophageal reflux disease)    Hypertension    Impaired fasting glucose    Syncope 03/2016   with [facility]   Varicose veins of both lower extremities     Past Surgical History: Past Surgical History:  Procedure Laterality Date   TONSILLECTOMY     TUMOR REMOVAL     ovaries    Allergies: Allergies as of 05/06/2021 - Review Complete 05/06/2021  Allergen Reaction Noted   Cheese  07/16/2020   Mushroom extract complex  08/10/2017   Penicillins  03/31/2016   Verapamil  07/16/2020   Aloe Hives and Rash 03/31/2016   Amlodipine Hives, Rash, and Hypertension 04/02/2016   Cephalexin Rash 04/02/2016   Metoprolol Hives, Palpitations, and Hypertension 04/02/2016   Miconazole Swelling and Rash 04/02/2016   Neosporin [neomycin-bacitracin zn-polymyx] Rash 04/02/2016   Trandolapril-verapamil hcl er Rash 04/02/2016    Medications:  Current Facility-Administered Medications:    cyclobenzaprine (FLEXERIL) tablet 5 mg, 5 mg, Oral, TID PRN, Agbata, Tochukwu, MD   lidocaine (LIDODERM) 5 % 1 patch, 1 patch, Transdermal, Q24H, Agbata, Tochukwu, MD, 1 patch at 05/06/21  0920   ondansetron (ZOFRAN) tablet 4 mg, 4 mg, Oral, Q6H PRN **OR** ondansetron (ZOFRAN) injection 4 mg, 4 mg, Intravenous, Q6H PRN, Agbata, Tochukwu, MD   traMADol (ULTRAM) tablet 50 mg, 50 mg, Oral, Q12H PRN, Agbata, Tochukwu, MD, 50 mg at 05/06/21 6283  Current Outpatient Medications:    acetaminophen (TYLENOL) 500 MG tablet, Take 500 mg by mouth every 8 (eight) hours as needed., Disp: , Rfl:    apixaban (ELIQUIS) 2.5 MG TABS tablet, Take 2.5 mg by mouth every 12 (twelve) hours., Disp: , Rfl:    atenolol (TENORMIN) 50 MG tablet, Take 1 tablet (50 mg total) by mouth daily., Disp: 30 tablet, Rfl: 0   Cholecalciferol (VITAMIN D) 2000 units tablet, Take 2,000 Units by mouth daily. , Disp: , Rfl:    citalopram (CELEXA) 20 MG tablet, Take 20 mg by mouth daily. , Disp: , Rfl:    clobetasol cream (TEMOVATE) 0.05 %, Apply 1 application topically 2 (two) times daily as needed (rash or itching). (apply to feet, ankles, arms, back, buttocks and perineal area), Disp: , Rfl:    famotidine (PEPCID) 40 MG tablet, Take 40 mg by mouth at bedtime., Disp: , Rfl:    hydrALAZINE (APRESOLINE) 50 MG tablet, Take 1 tablet (50 mg total) by mouth 3 (three) times daily., Disp: 90 tablet, Rfl: 0   HYDROcodone-acetaminophen (NORCO/VICODIN) 5-325 MG tablet, Take 2 tablets by mouth every 6 (six) hours as needed., Disp: , Rfl:  iron polysaccharides (POLY-IRON 150) 150 MG capsule, Take 150 mg by mouth daily., Disp: , Rfl:    lisinopril (ZESTRIL) 20 MG tablet, Take 20 mg by mouth daily., Disp: , Rfl:    mometasone (ELOCON) 0.1 % lotion, Apply 4 drops topically at bedtime as needed for itching or dry skin. Place into each ear., Disp: , Rfl:    Multiple Vitamins-Minerals (CERTAVITE/ANTIOXIDANTS) TABS, Take 1 tablet by mouth daily., Disp: , Rfl:    omega-3 acid ethyl esters (LOVAZA) 1 g capsule, Take 1 g by mouth 2 (two) times daily., Disp: , Rfl:    pantoprazole (PROTONIX) 40 MG tablet, Take 1 tablet (40 mg total) by mouth  daily., Disp: 30 tablet, Rfl: 0   Polyethyl Glycol-Propyl Glycol (SYSTANE ULTRA) 0.4-0.3 % SOLN, Place 1 drop into both eyes 3 (three) times daily as needed. , Disp: , Rfl:    polyethylene glycol (MIRALAX / GLYCOLAX) packet, Take 17 g by mouth daily. (Patient taking differently: Take 17 g by mouth daily. ), Disp: 30 each, Rfl: 0   potassium chloride (K-DUR,KLOR-CON) 10 MEQ tablet, Take 1 tablet (10 mEq total) by mouth 2 (two) times daily., Disp: 60 tablet, Rfl: 0   rosuvastatin (CRESTOR) 20 MG tablet, Take 20 mg by mouth at bedtime., Disp: , Rfl:    sodium chloride (OCEAN) 0.65 % SOLN nasal spray, Place 1 spray into both nostrils 3 (three) times daily as needed for congestion. , Disp: , Rfl:    spironolactone (ALDACTONE) 25 MG tablet, Take 1 tablet (25 mg total) by mouth daily., Disp: 30 tablet, Rfl: 0   Social History: Social History   Tobacco Use   Smoking status: Former    Types: Cigarettes   Smokeless tobacco: Never   Tobacco comments:    quit 1985  Substance Use Topics   Alcohol use: No   Drug use: No    Family Medical History: Family History  Problem Relation Age of Onset   Hypertension Other    Breast cancer Sister    Kidney cancer Neg Hx    Kidney disease Neg Hx    Prostate cancer Neg Hx     Physical Examination: Vitals:   05/06/21 1000 05/06/21 1030  BP: (!) 149/72 (!) 173/122  Pulse: 79 71  Resp: 16 16  Temp:    SpO2: 96% 100%     General: Patient is well developed, well nourished, calm, collected, and in no apparent distress.  Psychiatric: Patient is non-anxious.  Head:  Pupils equal, round, and reactive to light.  ENT:  Oral mucosa appears well hydrated.  Neck:   Supple.  Full range of motion.  Respiratory: Patient is breathing without any difficulty.  Extremities: No edema.  Vascular: Palpable pulses in dorsal pedal vessels.  Skin:   On exposed skin, there are no abnormal skin lesions.  NEUROLOGICAL:  General: In no acute distress.   Awake,  alert, oriented to person, place, and time.  Pupils equal round and reactive to light.  Facial tone is symmetric.  Tongue protrusion is midline.     Strength: Grossly fully strength on R throughout.   Prior history of stroke affecting L side - 4-4+ in most L sided muscles (baseline)    Bilateral upper and lower extremity sensation is intact to light touch.  Clonus is not present.  Toes are down-going.    Gait is untested due to fracture.  Imaging: TL spine imaging 05/06/2021 IMPRESSION: Suspected acute T11 anterior wedge compression fracture with approximately 50% loss of  height. No retropulsion.     Electronically Signed   By: Helyn Numbers M.D.   On: 05/06/2021 02:23    I have personally reviewed the images and agree with the above interpretation.  Labs: CBC Latest Ref Rng & Units 05/06/2021 04/13/2021 07/19/2020  WBC 4.0 - 10.5 K/uL 9.3 8.5 7.4  Hemoglobin 12.0 - 15.0 g/dL 83.0 11.3(L) 10.7(L)  Hematocrit 36.0 - 46.0 % 36.7 34.0(L) 32.4(L)  Platelets 150 - 400 K/uL 256 238 219    Assessment and Plan: Ms. Pung is a pleasant 87 y.o. female with T11 compression fracture without neurological sequelae.  - TLSO (I will order) - pain control - ok to mobilize with PT after brace placed    Kirah Stice K. Myer Haff MD, MPHS Dept. of Neurosurgery

## 2021-05-06 NOTE — ED Notes (Signed)
Pt c/o leg cramping, educated pt about flexing legs and feet. Pt appreciative.

## 2021-05-06 NOTE — Progress Notes (Signed)
Pt arrived to the floor at this time alert and oriented x 4. Pain level 4. Pt restless. BP 233/113. This nurse requested PRN BP med from Dr Joylene Igo before arriving to the floor due to elevated BP noted in the ED flowchart. Per Dr Joylene Igo, pt son refused IV BP meds for pt in ED. This nurse spoke with son on phone and made him aware of current elevated BP and asked if needed, could we give IV BP meds? Son agreed as long as it was the minimum dose. MD notified and night nurse made aware. It was agreed with the son that we would re evaluate BP meds after first night shift vital checks. Night nurse made aware of this as well. Pt oriented to room and call light and bed inlowest position. Oldest son at bedside.

## 2021-05-06 NOTE — ED Notes (Signed)
Son, [name], returned to bedside. Pt awake now. Daily meds from nursing home fax to med tech for med rec.

## 2021-05-06 NOTE — ED Notes (Signed)
Pt's linen, chux, and purewick. Pt had small dark tarry bm

## 2021-05-06 NOTE — ED Notes (Signed)
Updated pt on room. Ortho tech at bedside.

## 2021-05-06 NOTE — ED Triage Notes (Signed)
Patient arrives via EMS from [facility] to ED after slipping out of wheelchair onto floor. Patient is on a blood thinner with hx of AFIB but did not hit her head, is only complaining of lower back pain. Patient arrives hypertensive but awake and alert, very hard of hearing. Patient is a DNR and has gold form with her.

## 2021-05-06 NOTE — ED Notes (Signed)
Pt's son very concerned about giving IV blood pressure medication. Pt's son requests that NO BLOOD PRESSURE MEDICATION BE GIVEN IV due to pt "tanking" in the past with IV BP meds, unless extenuating circumstances.

## 2021-05-06 NOTE — H&P (Signed)
History and Physical    Debbie Hampton VFI:[id number] DOB: July 02, 1932 DOA: 05/06/2021  PCP: Pcp, No   Patient coming from: [name]  I have personally briefly reviewed patient's old medical records in [facility]  Chief Complaint: Back pain  HPI: Debbie Hampton is a 87 y.o. female with medical history significant for atrial fibrillation on chronic anticoagulation therapy, GERD, hypertension who was brought into the ER by EMS after she slid out of her wheelchair and landed on the floor.  She denies hitting her head and denies any loss of consciousness.  Blood pressure was significantly elevated when she arrived to ER. She is able to move her extremities and denies having any urinary or fecal incontinence or saddle anesthesia During my evaluation she appears very uncomfortable and restless but denies having any chest pain, no shortness of breath, no nausea, no vomiting, no abdominal pain, no changes in her bowel habits, no fever, no chills, no cough, no dizziness, no lightheadedness, no headache, no blurred vision no focal deficit. Labs show sodium 132, potassium 4.1, chloride 100, bicarb 29, glucose 114, BUN 18, creatinine 0.93, calcium 10.0, total CK 65, white count 9.3, hemoglobin 12.2, hematocrit 36.7, MCV 86.4, RDW 13.7, platelet count 256 Urinalysis is sterile Respiratory viral panel is negative X-ray of thoracic spine shows suspected acute T11 anterior wedge compression fracture with approximately 50% loss of height. No retropulsion. Lumbar spine x-ray shows no definite acute fracture or traumatic listhesis of the lumbar spine. Remote compression deformity of L3. Twelve-lead EKG reviewed by me shows atrial fibrillation with LVH    ED Course: Patient is an 87 year old female who resides at [facility] who was brought into the ER for evaluation after she slid out of her wheelchair with complaints of back pain. She has an acute T11 anterior wedge compression fracture. She will be  admitted to the hospital for further evaluation.    Review of Systems: As per HPI otherwise all other systems reviewed and negative.    Past Medical History:  Diagnosis Date   Atrial fibrillation (HCC)    Bilateral shoulder pain    Colon polyp    Constipation    GERD (gastroesophageal reflux disease)    Hypertension    Impaired fasting glucose    Syncope 03/2016   with [facility]   Varicose veins of both lower extremities     Past Surgical History:  Procedure Laterality Date   TONSILLECTOMY     TUMOR REMOVAL     ovaries     reports that she has quit smoking. Her smoking use included cigarettes. She has never used smokeless tobacco. She reports that she does not drink alcohol and does not use drugs.  Allergies  Allergen Reactions   Cheese     GOAT cheese   Mushroom Extract Complex    Penicillins     Has patient had a PCN reaction causing immediate rash, facial/tongue/throat swelling, SOB or lightheadedness with hypotension: Yes Has patient had a PCN reaction causing severe rash involving mucus membranes or skin necrosis: No Has patient had a PCN reaction that required hospitalization No Has patient had a PCN reaction occurring within the last 10 years: Yes If all of the above answers are "NO", then may proceed with Cephalosporin use.    Verapamil    Aloe Hives and Rash   Amlodipine Hives, Rash and Hypertension   Cephalexin Rash   Metoprolol Hives, Palpitations and Hypertension   Miconazole Swelling and Rash   Neosporin [Neomycin-Bacitracin Zn-Polymyx] Rash  Trandolapril-Verapamil Hcl Er Rash    Family History  Problem Relation Age of Onset   Hypertension Other    Breast cancer Sister    Kidney cancer Neg Hx    Kidney disease Neg Hx    Prostate cancer Neg Hx       Prior to Admission medications   Medication Sig Start Date End Date Taking? Authorizing Provider  acetaminophen (TYLENOL) 500 MG tablet Take 500 mg by mouth every 8 (eight) hours as needed.     [provider]  apixaban (ELIQUIS) 2.5 MG TABS tablet Take 2.5 mg by mouth every 12 (twelve) hours. 12/20/19   [provider]  atenolol (TENORMIN) 50 MG tablet Take 1 tablet (50 mg total) by mouth daily. 08/23/18   Adrian SaranMody, Sital, MD  Cholecalciferol (VITAMIN D) 2000 units tablet Take 2,000 Units by mouth daily.     [provider]  citalopram (CELEXA) 20 MG tablet Take 20 mg by mouth daily.     [provider]  clobetasol cream (TEMOVATE) 0.05 % Apply 1 application topically 2 (two) times daily as needed (rash or itching). (apply to feet, ankles, arms, back, buttocks and perineal area)    [provider]  famotidine (PEPCID) 40 MG tablet Take 40 mg by mouth at bedtime. 06/27/20   [provider]  hydrALAZINE (APRESOLINE) 50 MG tablet Take 1 tablet (50 mg total) by mouth 3 (three) times daily. 08/13/17   Love, Evlyn KannerPamela S, PA-C  iron polysaccharides (POLY-IRON 150) 150 MG capsule Take 150 mg by mouth daily.    [provider]  lisinopril (ZESTRIL) 20 MG tablet Take 20 mg by mouth daily. 06/27/20   [provider]  mometasone (ELOCON) 0.1 % lotion Apply 4 drops topically at bedtime as needed for itching or dry skin. Place into each ear. 05/12/20   [provider]  Multiple Vitamins-Minerals (CERTAVITE/ANTIOXIDANTS) TABS Take 1 tablet by mouth daily.    [provider]  omega-3 acid ethyl esters (LOVAZA) 1 g capsule Take 1 g by mouth 2 (two) times daily.    [provider]  pantoprazole (PROTONIX) 40 MG tablet Take 1 tablet (40 mg total) by mouth daily. 08/14/17   Love, Evlyn KannerPamela S, PA-C  Polyethyl Glycol-Propyl Glycol (SYSTANE ULTRA) 0.4-0.3 % SOLN Place 1 drop into both eyes 3 (three) times daily as needed.     [provider]  polyethylene glycol (MIRALAX / GLYCOLAX) packet Take 17 g by mouth daily. Patient taking differently: Take 17 g by mouth daily.  08/14/17   Love, Evlyn KannerPamela S, PA-C  potassium  chloride (K-DUR,KLOR-CON) 10 MEQ tablet Take 1 tablet (10 mEq total) by mouth 2 (two) times daily. 08/13/17   Love, Evlyn KannerPamela S, PA-C  rosuvastatin (CRESTOR) 20 MG tablet Take 20 mg by mouth at bedtime.    [provider]  sodium chloride (OCEAN) 0.65 % SOLN nasal spray Place 1 spray into both nostrils 3 (three) times daily as needed for congestion.     [provider]  spironolactone (ALDACTONE) 25 MG tablet Take 1 tablet (25 mg total) by mouth daily. 08/24/18   Adrian SaranMody, Sital, MD    Physical Exam: Vitals:   05/06/21 0700 05/06/21 0730 05/06/21 0800 05/06/21 0830  BP: (!) 165/84 (!) 176/85 118/69 95/61  Pulse: 85 81 88 86  Resp: 19 15 18 18   Temp:      TempSrc:      SpO2: 97% 96% 97% 100%  Weight:      Height:  Vitals:   05/06/21 0700 05/06/21 0730 05/06/21 0800 05/06/21 0830  BP: (!) 165/84 (!) 176/85 118/69 95/61  Pulse: 85 81 88 86  Resp: 19 15 18 18   Temp:      TempSrc:      SpO2: 97% 96% 97% 100%  Weight:      Height:          Constitutional: Alert and oriented x 3 .  Appears to be uncomfortable and in painful distress.  Very restless  HEENT:      Head: Normocephalic and atraumatic.         Eyes: PERLA, EOMI, Conjunctivae are normal. Sclera is non-icteric.       Mouth/Throat: Mucous membranes are moist.       Neck: Supple with no signs of meningismus. Cardiovascular: Irregularly irregular. No murmurs, gallops, or rubs. 2+ symmetrical distal pulses are present . No JVD. No LE edema Respiratory: Respiratory effort normal .Lungs sounds clear bilaterally. No wheezes, crackles, or rhonchi.  Gastrointestinal: Soft, non tender, and non distended with positive bowel sounds.  Genitourinary: No CVA tenderness. Musculoskeletal: Nontender with normal range of motion in all extremities. No cyanosis, or erythema of extremities. Neurologic:  Face is symmetric. Moving all extremities. No gross focal neurologic deficits .  Able to move all extremities Skin: Skin  is warm, dry.  No rash or ulcers Psychiatric: Mood and affect are normal    Labs on Admission: I have personally reviewed following labs and imaging studies  CBC: Recent Labs  Lab 05/06/21 0302  WBC 9.3  NEUTROABS 6.5  HGB 12.2  HCT 36.7  MCV 86.4  PLT 256   Basic Metabolic Panel: Recent Labs  Lab 05/06/21 0302  NA 132*  K 4.1  CL 100  CO2 29  GLUCOSE 114*  BUN 18  CREATININE 0.93  CALCIUM 10.0   GFR: Estimated Creatinine Clearance: 37.6 mL/min (by C-G formula based on SCr of 0.93 mg/dL). Liver Function Tests: No results for input(s): AST, ALT, ALKPHOS, BILITOT, PROT, ALBUMIN in the last 168 hours. No results for input(s): LIPASE, AMYLASE in the last 168 hours. No results for input(s): AMMONIA in the last 168 hours. Coagulation Profile: No results for input(s): INR, PROTIME in the last 168 hours. Cardiac Enzymes: Recent Labs  Lab 05/06/21 0302  CKTOTAL 65   BNP (last 3 results) No results for input(s): PROBNP in the last 8760 hours. HbA1C: No results for input(s): HGBA1C in the last 72 hours. CBG: No results for input(s): GLUCAP in the last 168 hours. Lipid Profile: No results for input(s): CHOL, HDL, LDLCALC, TRIG, CHOLHDL, LDLDIRECT in the last 72 hours. Thyroid Function Tests: No results for input(s): TSH, T4TOTAL, FREET4, T3FREE, THYROIDAB in the last 72 hours. Anemia Panel: No results for input(s): VITAMINB12, FOLATE, FERRITIN, TIBC, IRON, RETICCTPCT in the last 72 hours. Urine analysis:    Component Value Date/Time   COLORURINE YELLOW 05/06/2021 0315   APPEARANCEUR CLEAR 05/06/2021 0315   APPEARANCEUR Clear 11/20/2017 0854   LABSPEC 1.015 05/06/2021 0315   PHURINE 7.0 05/06/2021 0315   GLUCOSEU NEGATIVE 05/06/2021 0315   HGBUR TRACE (A) 05/06/2021 0315   BILIRUBINUR NEGATIVE 05/06/2021 0315   BILIRUBINUR Negative 11/20/2017 0854   KETONESUR NEGATIVE 05/06/2021 0315   PROTEINUR NEGATIVE 05/06/2021 0315   NITRITE NEGATIVE 05/06/2021 0315    LEUKOCYTESUR TRACE (A) 05/06/2021 0315    Radiological Exams on Admission: DG Thoracic Spine 2 View  Result Date: 05/06/2021 CLINICAL DATA:  Fall, back pain EXAM: THORACIC SPINE 2  VIEWS COMPARISON:  None. Findings are correlated with CT examination of the chest of 08/22/2018 FINDINGS: The osseous structures are diffusely osteopenic. There is a compression fracture of T11 with approximately 50% loss of height which is new from prior examination and is associated with adjacent paravertebral stripe widening suggesting surrounding edema and/or hemorrhage in the setting of an acute fracture. No listhesis. No retropulsion. Remaining vertebral body height has been preserved. Mild diffuse degenerative disc disease noted throughout the thoracic spine. IMPRESSION: Suspected acute T11 anterior wedge compression fracture with approximately 50% loss of height. No retropulsion. Electronically Signed   By: Helyn Numbers M.D.   On: 05/06/2021 02:23   DG Lumbar Spine Complete  Result Date: 05/06/2021 CLINICAL DATA:  Fall, back pain EXAM: LUMBAR SPINE - COMPLETE 4+ VIEW COMPARISON:  None. FINDINGS: The osseous structures are diffusely osteopenic. Slightly accentuated lumbar lordosis. Grade 1 anterolisthesis of L4 upon L5. Mild compression deformity of L3 with mild loss of height, likely chronic in nature. No retropulsion. Remaining vertebral body height has been preserved. There is intervertebral disc space narrowing and endplate remodeling at L 2 3 in keeping with changes of moderate degenerative disc disease at this level. Milder degenerative changes are noted at L3-4. Facet arthrosis is noted at L4-5, not well profiled on this examination. The paraspinal soft tissues are unremarkable. IMPRESSION: No definite acute fracture or traumatic listhesis of the lumbar spine. Remote compression deformity of L3. Electronically Signed   By: Helyn Numbers M.D.   On: 05/06/2021 02:26     Assessment/Plan Principal Problem:    Compression fracture of T11 vertebra (HCC) Active Problems:   Hyponatremia   Elevated troponin   Atrial fibrillation (HCC)   Hypertensive urgency      Acute compression fracture of T11 vertebral Following a fall Pain control with Lidoderm patch and tramadol Add muscle relaxants as needed We will consult neurosurgery Place patient on fall precautions    Hypertensive urgency Most likely secondary to pain Patient received a dose of IV hydralazine 5 mg and lisinopril 10 mg oral with improvement in her blood pressure Continue oral antihypertensive medications      Atrial fibrillation Rate controlled Continue Eliquis as secondary prophylaxis for an acute stroke     Hyponatremia Most likely secondary to SSRI use Monitor sodium levels    Elevated troponin Most likely from demand ischemia related to uncontrolled blood pressure Patient has no EKG changes has no chest pain or shortness of breath.   DVT prophylaxis: Apixaban Code Status: DO NOT RESUSCITATE Family Communication: Greater than 50% of time was spent discussing patient's condition and plan of care with her son at the bedside.  All questions and concerns have been addressed.  He verbalizes understanding and agrees with the plan. Disposition Plan: Back to previous home environment Consults called: Neurosurgery Status: Observation    Landy Dunnavant MD [facility]     05/06/2021, 8:58 AM

## 2021-05-06 NOTE — ED Notes (Signed)
Debbie Hampton [phone] [facility])- call when bed avail

## 2021-05-06 NOTE — ED Notes (Signed)
Ortho called for TLSO

## 2021-05-06 NOTE — ED Provider Notes (Signed)
[facility] Emergency Department Provider Note ____________________________________________   Event Date/Time   First MD Initiated Contact with Patient 05/06/21 [phone]     (approximate)  I have reviewed the triage vital signs and the nursing notes.   HISTORY  Chief Complaint Back Pain    HPI Debbie Hampton is a 87 y.o. female with history of atrial fibrillation on Eliquis, hypertension, CVA who presents to the emergency department EMS from [facility] after they report that she slipped out of her wheelchair tonight and was found on the ground.  They are unsure how long she was down.  Patient is unable to tell me why she slipped out of her wheelchair.  She is complaining of mid back pain which her son reports has been an issue for her for the past few weeks.  Because of this pain she has been mostly wheelchair-bound.  She denies any numbness or weakness.  She did not hit her head.  She denies headache, vision changes, chest pain, shortness of breath, abdominal pain, fevers, vomiting or diarrhea currently or recently.  Spoke with Venita Sheffield at her nursing facility.  It is unclear if patient had her blood pressure medication tonight.  She is extremely hypertensive here.  It appears she is on lisinopril 10 mg daily and Lasix 20 mg daily.        Past Medical History:  Diagnosis Date   Atrial fibrillation (HCC)    Bilateral shoulder pain    Colon polyp    Constipation    GERD (gastroesophageal reflux disease)    Hypertension    Impaired fasting glucose    Syncope 03/2016   with [facility]   Varicose veins of both lower extremities     Patient Active Problem List   Diagnosis Date Noted   Hypertensive urgency 05/06/2021   Atrial fibrillation (HCC)    Hypertension    Elevated troponin 11/26/2019   Labile blood pressure    Benign essential HTN    Hypertensive crisis    Hyponatremia    Prediabetes    Hypoalbuminemia due to protein-calorie malnutrition (HCC)    Acute  blood loss anemia    Stroke (cerebrum) (HCC) 08/01/2017   Acute CVA (cerebrovascular accident) (HCC) 07/29/2017   Syncope, near 03/31/2016    Past Surgical History:  Procedure Laterality Date   TONSILLECTOMY     TUMOR REMOVAL     ovaries    Prior to Admission medications   Medication Sig Start Date End Date Taking? Authorizing Provider  acetaminophen (TYLENOL) 500 MG tablet Take 500 mg by mouth every 8 (eight) hours as needed.    [provider]  apixaban (ELIQUIS) 2.5 MG TABS tablet Take 2.5 mg by mouth every 12 (twelve) hours. 12/20/19   [provider]  atenolol (TENORMIN) 50 MG tablet Take 1 tablet (50 mg total) by mouth daily. 08/23/18   Adrian Saran, MD  Cholecalciferol (VITAMIN D) 2000 units tablet Take 2,000 Units by mouth daily.     [provider]  citalopram (CELEXA) 20 MG tablet Take 20 mg by mouth daily.     [provider]  clobetasol cream (TEMOVATE) 0.05 % Apply 1 application topically 2 (two) times daily as needed (rash or itching). (apply to feet, ankles, arms, back, buttocks and perineal area)    [provider]  famotidine (PEPCID) 40 MG tablet Take 40 mg by mouth at bedtime. 06/27/20   [provider]  hydrALAZINE (APRESOLINE) 50 MG tablet Take 1 tablet (50 mg  total) by mouth 3 (three) times daily. 08/13/17   Love, Evlyn Kanner, PA-C  iron polysaccharides (POLY-IRON 150) 150 MG capsule Take 150 mg by mouth daily.    [provider]  lisinopril (ZESTRIL) 20 MG tablet Take 20 mg by mouth daily. 06/27/20   [provider]  mometasone (ELOCON) 0.1 % lotion Apply 4 drops topically at bedtime as needed for itching or dry skin. Place into each ear. 05/12/20   [provider]  Multiple Vitamins-Minerals (CERTAVITE/ANTIOXIDANTS) TABS Take 1 tablet by mouth daily.    [provider]  omega-3 acid ethyl esters (LOVAZA) 1 g capsule Take 1 g by mouth 2 (two) times daily.    [provider]   pantoprazole (PROTONIX) 40 MG tablet Take 1 tablet (40 mg total) by mouth daily. 08/14/17   Love, Evlyn Kanner, PA-C  Polyethyl Glycol-Propyl Glycol (SYSTANE ULTRA) 0.4-0.3 % SOLN Place 1 drop into both eyes 3 (three) times daily as needed.     [provider]  polyethylene glycol (MIRALAX / GLYCOLAX) packet Take 17 g by mouth daily. Patient taking differently: Take 17 g by mouth daily.  08/14/17   Love, Evlyn Kanner, PA-C  potassium chloride (K-DUR,KLOR-CON) 10 MEQ tablet Take 1 tablet (10 mEq total) by mouth 2 (two) times daily. 08/13/17   Love, Evlyn Kanner, PA-C  rosuvastatin (CRESTOR) 20 MG tablet Take 20 mg by mouth at bedtime.    [provider]  sodium chloride (OCEAN) 0.65 % SOLN nasal spray Place 1 spray into both nostrils 3 (three) times daily as needed for congestion.     [provider]  spironolactone (ALDACTONE) 25 MG tablet Take 1 tablet (25 mg total) by mouth daily. 08/24/18   Adrian Saran, MD    Allergies Cheese, Mushroom extract complex, Penicillins, Verapamil, Aloe, Amlodipine, Cephalexin, Metoprolol, Miconazole, Neosporin [neomycin-bacitracin zn-polymyx], and Trandolapril-verapamil hcl er  Family History  Problem Relation Age of Onset   Hypertension Other    Breast cancer Sister    Kidney cancer Neg Hx    Kidney disease Neg Hx    Prostate cancer Neg Hx     Social History Social History   Tobacco Use   Smoking status: Former    Types: Cigarettes   Smokeless tobacco: Never   Tobacco comments:    quit 1985  Substance Use Topics   Alcohol use: No   Drug use: No    Review of Systems Constitutional: No fever. Eyes: No visual changes. ENT: No sore throat. Cardiovascular: Denies chest pain. Respiratory: Denies shortness of breath. Gastrointestinal: No nausea, vomiting, diarrhea. Genitourinary: Negative for dysuria. Musculoskeletal: Negative for back pain. Skin: Negative for rash. Neurological: Negative for focal weakness or  numbness.   ____________________________________________   PHYSICAL EXAM:  VITAL SIGNS: ED Triage Vitals  Enc Vitals Group     BP 05/06/21 0044 (!) 213/137     Pulse Rate 05/06/21 0044 76     Resp 05/06/21 0044 16     Temp 05/06/21 0044 97.6 F (36.4 C)     Temp Source 05/06/21 0044 Oral     SpO2 05/06/21 0044 99 %     Weight 05/06/21 0045 136 lb 14.5 oz (62.1 kg)     Height 05/06/21 0045 5\' 2"  (1.575 m)     Head Circumference --      Peak Flow --      Pain Score --      Pain Loc --      Pain Edu? --  Excl. in GC? --    CONSTITUTIONAL: Alert and oriented x 4 and responds appropriately to questions.  Elderly HEAD: Normocephalic; atraumatic EYES: Conjunctivae clear, PERRL, EOMI ENT: normal nose; no rhinorrhea; moist mucous membranes; pharynx without lesions noted; no dental injury; no septal hematoma NECK: Supple, no meningismus, no LAD; no midline spinal tenderness, step-off or deformity; trachea midline CARD: Irregularly irregular and rate controlled; S1 and S2 appreciated; no murmurs, no clicks, no rubs, no gallops RESP: Normal chest excursion without splinting or tachypnea; breath sounds clear and equal bilaterally; no wheezes, no rhonchi, no rales; no hypoxia or respiratory distress CHEST:  chest wall stable, no crepitus or ecchymosis or deformity, nontender to palpation; no flail chest ABD/GI: Normal bowel sounds; non-distended; soft, non-tender, no rebound, no guarding; no ecchymosis or other lesions noted PELVIS:  stable, nontender to palpation BACK:  The back appears normal and is tender to palpation over the thoracic and lumbar spine without step-off or deformity, patient has significant kyphosis.  No soft tissue swelling or ecchymosis.  No hematoma. EXT: Normal ROM in all joints; non-tender to palpation; no edema; normal capillary refill; no cyanosis, no bony tenderness or bony deformity of patient's extremities, no joint effusion, compartments are soft, extremities  are warm and well-perfused, no ecchymosis SKIN: Normal color for age and race; warm NEURO: Moves all extremities equally, normal sensation diffusely, no saddle anesthesia, normal deep tendon reflexes, normal speech, no facial asymmetry PSYCH: The patient's mood and manner are appropriate. Grooming and personal hygiene are appropriate.  ____________________________________________   LABS (all labs ordered are listed, but only abnormal results are displayed)  Labs Reviewed  BASIC METABOLIC PANEL - Abnormal; Notable for the following components:      Result Value   Sodium 132 (*)    Glucose, Bld 114 (*)    GFR, Estimated 60 (*)    Anion gap 3 (*)    All other components within normal limits  URINALYSIS, COMPLETE (UACMP) WITH MICROSCOPIC - Abnormal; Notable for the following components:   Hgb urine dipstick TRACE (*)    Leukocytes,Ua TRACE (*)    Bacteria, UA RARE (*)    All other components within normal limits  TROPONIN I (HIGH SENSITIVITY) - Abnormal; Notable for the following components:   Troponin I (High Sensitivity) 21 (*)    All other components within normal limits  TROPONIN I (HIGH SENSITIVITY) - Abnormal; Notable for the following components:   Troponin I (High Sensitivity) 20 (*)    All other components within normal limits  RESP PANEL BY RT-PCR (FLU A&B, COVID) ARPGX2  CBC WITH DIFFERENTIAL/PLATELET  CK   ____________________________________________  EKG   EKG Interpretation  Date/Time:  Sunday May 06 2021 03:01:57 EDT Ventricular Rate:  91 PR Interval:    QRS Duration: 91 QT Interval:  395 QTC Calculation: 486 R Axis:   68 Text Interpretation: Atrial fibrillation Probable left ventricular hypertrophy Borderline prolonged QT interval Baseline wander in lead(s) I III aVL Confirmed by Rochele RaringWard, Keiland Pickering [phone]([id number]) on 05/06/2021 6:49:30 AM        ____________________________________________  RADIOLOGY Normajean BaxterI, Jamiyla Ishee, personally viewed and evaluated these images  (plain radiographs) as part of my medical decision making, as well as reviewing the written report by the radiologist.  ED MD interpretation: X-ray shows an acute T11 anterior wedge compression fracture with 50% loss of height and no retropulsion.  Official radiology report(s): DG Thoracic Spine 2 View  Result Date: 05/06/2021 CLINICAL DATA:  Fall, back pain EXAM: THORACIC SPINE 2  VIEWS COMPARISON:  None. Findings are correlated with CT examination of the chest of 08/22/2018 FINDINGS: The osseous structures are diffusely osteopenic. There is a compression fracture of T11 with approximately 50% loss of height which is new from prior examination and is associated with adjacent paravertebral stripe widening suggesting surrounding edema and/or hemorrhage in the setting of an acute fracture. No listhesis. No retropulsion. Remaining vertebral body height has been preserved. Mild diffuse degenerative disc disease noted throughout the thoracic spine. IMPRESSION: Suspected acute T11 anterior wedge compression fracture with approximately 50% loss of height. No retropulsion. Electronically Signed   By: Helyn Numbers M.D.   On: 05/06/2021 02:23   DG Lumbar Spine Complete  Result Date: 05/06/2021 CLINICAL DATA:  Fall, back pain EXAM: LUMBAR SPINE - COMPLETE 4+ VIEW COMPARISON:  None. FINDINGS: The osseous structures are diffusely osteopenic. Slightly accentuated lumbar lordosis. Grade 1 anterolisthesis of L4 upon L5. Mild compression deformity of L3 with mild loss of height, likely chronic in nature. No retropulsion. Remaining vertebral body height has been preserved. There is intervertebral disc space narrowing and endplate remodeling at L 2 3 in keeping with changes of moderate degenerative disc disease at this level. Milder degenerative changes are noted at L3-4. Facet arthrosis is noted at L4-5, not well profiled on this examination. The paraspinal soft tissues are unremarkable. IMPRESSION: No definite acute  fracture or traumatic listhesis of the lumbar spine. Remote compression deformity of L3. Electronically Signed   By: Helyn Numbers M.D.   On: 05/06/2021 02:26    ____________________________________________   PROCEDURES  Procedure(s) performed (including Critical Care):  Procedures  CRITICAL CARE Performed by: Baxter Hire Lyvia Mondesir   Total critical care time: 45 minutes  Critical care time was exclusive of separately billable procedures and treating other patients.  Critical care was necessary to treat or prevent imminent or life-threatening deterioration.  Critical care was time spent personally by me on the following activities: development of treatment plan with patient and/or surrogate as well as nursing, discussions with consultants, evaluation of patient's response to treatment, examination of patient, obtaining history from patient or surrogate, ordering and performing treatments and interventions, ordering and review of laboratory studies, ordering and review of radiographic studies, pulse oximetry and re-evaluation of patient's condition.  ____________________________________________   INITIAL IMPRESSION / ASSESSMENT AND PLAN / ED COURSE  As part of my medical decision making, I reviewed the following data within the electronic MEDICAL RECORD NUMBER History obtained from family, Nursing notes reviewed and incorporated, Labs reviewed , EKG interpreted , Old EKG reviewed, Radiograph reviewed , Discussed with admitting physician , and Notes from prior ED visits         Patient here with a fall out of her wheelchair today.  She is complaining of back pain.  Appears to be neurologically intact and is oriented.  Denies hitting her head but she is on Eliquis.  Will obtain x-rays of her back.  Also found to be very hypertensive here.  She is on lisinopril and Lasix and it is unclear if she had her medications today.  Patient denies that they gave her her medications.  I did talk to the nursing  home staff but they are not sure if she was given the medications or not.  We will give her home dose of lisinopril here.  She is requesting something for pain.  We will start with Tylenol.  Will obtain labs, EKG and urine to evaluate for any signs of endorgan damage.  She denies currently  having any headache, vision changes, chest pain, numbness or weakness.  ED PROGRESS  Patient's x-ray is concerning for an acute T11 compression fracture.  She is tender over this area.  There is no retropulsion and she has no focal neurologic deficits today.  Her son is now at bedside who thinks that this may be from her fall today but could also potentially be within the past few weeks as she has had other injuries and she has been having back pain so bad that she has not been able to ambulate with a walker and has been having to use a wheelchair.  He agrees with starting with Tylenol for pain control as he states that she has had problems with narcotics before.  He is also concerned that her blood pressure is significantly elevated.  This could be from pain however she does not appear significantly uncomfortable currently.  We have given her home dose of medication without significant relief.  Her first troponin has come back mildly elevated.  Second is pending.  EKG shows no ischemic change.  No other signs of endorgan damage.  She is not having chest pain or shortness of breath.  Given her hypertension and minimally elevated troponin, have recommended admission and they agree.   Discussed patient's case with hospitalist, Dr. Para March.  I have recommended admission and patient (and family if present) agree with this plan. Admitting physician will place admission orders.   I reviewed all nursing notes, vitals, pertinent previous records and reviewed/interpreted all EKGs, lab and urine results, imaging (as available).   6:55 AM  Blood pressure continues to be significantly elevated.  Will give IV hydralazine for  concerns for hypertensive urgency.  Repeat troponin stable.  ____________________________________________   FINAL CLINICAL IMPRESSION(S) / ED DIAGNOSES  Final diagnoses:  Closed wedge compression fracture of T11 vertebra, initial encounter (HCC)  Hypertension, unspecified type  Elevated troponin     ED Discharge Orders     None       *Please note:  Debbie Hampton was evaluated in Emergency Department on 05/06/2021 for the symptoms described in the history of present illness. She was evaluated in the context of the global COVID-19 pandemic, which necessitated consideration that the patient might be at risk for infection with the SARS-CoV-2 virus that causes COVID-19. Institutional protocols and algorithms that pertain to the evaluation of patients at risk for COVID-19 are in a state of rapid change based on information released by regulatory bodies including the CDC and federal and state organizations. These policies and algorithms were followed during the patient's care in the ED.  Some ED evaluations and interventions may be delayed as a result of limited staffing during and the pandemic.*   Note:  This document was prepared using Dragon voice recognition software and may include unintentional dictation errors.    Priseis Cratty, Layla Maw, DO 05/06/21 [phone]

## 2021-05-06 NOTE — ED Notes (Signed)
Pt enjoying banana and oatmeal with son's assistance

## 2021-05-07 ENCOUNTER — Inpatient Hospital Stay: Payer: Medicare Other

## 2021-05-07 ENCOUNTER — Observation Stay: Payer: Medicare Other

## 2021-05-07 DIAGNOSIS — I10 Essential (primary) hypertension: Secondary | ICD-10-CM | POA: Diagnosis not present

## 2021-05-07 DIAGNOSIS — Z79899 Other long term (current) drug therapy: Secondary | ICD-10-CM | POA: Diagnosis not present

## 2021-05-07 DIAGNOSIS — E039 Hypothyroidism, unspecified: Secondary | ICD-10-CM | POA: Diagnosis present

## 2021-05-07 DIAGNOSIS — Z8673 Personal history of transient ischemic attack (TIA), and cerebral infarction without residual deficits: Secondary | ICD-10-CM | POA: Diagnosis not present

## 2021-05-07 DIAGNOSIS — F0390 Unspecified dementia without behavioral disturbance: Secondary | ICD-10-CM | POA: Diagnosis present

## 2021-05-07 DIAGNOSIS — Z91018 Allergy to other foods: Secondary | ICD-10-CM | POA: Diagnosis not present

## 2021-05-07 DIAGNOSIS — Z888 Allergy status to other drugs, medicaments and biological substances status: Secondary | ICD-10-CM | POA: Diagnosis not present

## 2021-05-07 DIAGNOSIS — S22080A Wedge compression fracture of T11-T12 vertebra, initial encounter for closed fracture: Secondary | ICD-10-CM | POA: Diagnosis present

## 2021-05-07 DIAGNOSIS — S22080K Wedge compression fracture of T11-T12 vertebra, subsequent encounter for fracture with nonunion: Secondary | ICD-10-CM | POA: Diagnosis not present

## 2021-05-07 DIAGNOSIS — E871 Hypo-osmolality and hyponatremia: Secondary | ICD-10-CM | POA: Diagnosis present

## 2021-05-07 DIAGNOSIS — Z20822 Contact with and (suspected) exposure to covid-19: Secondary | ICD-10-CM | POA: Diagnosis present

## 2021-05-07 DIAGNOSIS — S22000A Wedge compression fracture of unspecified thoracic vertebra, initial encounter for closed fracture: Secondary | ICD-10-CM | POA: Diagnosis present

## 2021-05-07 DIAGNOSIS — N179 Acute kidney failure, unspecified: Secondary | ICD-10-CM | POA: Diagnosis not present

## 2021-05-07 DIAGNOSIS — Z993 Dependence on wheelchair: Secondary | ICD-10-CM | POA: Diagnosis not present

## 2021-05-07 DIAGNOSIS — E119 Type 2 diabetes mellitus without complications: Secondary | ICD-10-CM | POA: Insufficient documentation

## 2021-05-07 DIAGNOSIS — K219 Gastro-esophageal reflux disease without esophagitis: Secondary | ICD-10-CM | POA: Diagnosis present

## 2021-05-07 DIAGNOSIS — R778 Other specified abnormalities of plasma proteins: Secondary | ICD-10-CM | POA: Diagnosis present

## 2021-05-07 DIAGNOSIS — I119 Hypertensive heart disease without heart failure: Secondary | ICD-10-CM | POA: Diagnosis present

## 2021-05-07 DIAGNOSIS — Z880 Allergy status to penicillin: Secondary | ICD-10-CM | POA: Diagnosis not present

## 2021-05-07 DIAGNOSIS — W050XXA Fall from non-moving wheelchair, initial encounter: Secondary | ICD-10-CM | POA: Diagnosis present

## 2021-05-07 DIAGNOSIS — Z66 Do not resuscitate: Secondary | ICD-10-CM | POA: Diagnosis present

## 2021-05-07 DIAGNOSIS — Z881 Allergy status to other antibiotic agents status: Secondary | ICD-10-CM | POA: Diagnosis not present

## 2021-05-07 DIAGNOSIS — Z87891 Personal history of nicotine dependence: Secondary | ICD-10-CM | POA: Diagnosis not present

## 2021-05-07 DIAGNOSIS — Z8249 Family history of ischemic heart disease and other diseases of the circulatory system: Secondary | ICD-10-CM | POA: Diagnosis not present

## 2021-05-07 DIAGNOSIS — Z7901 Long term (current) use of anticoagulants: Secondary | ICD-10-CM | POA: Diagnosis not present

## 2021-05-07 DIAGNOSIS — I4891 Unspecified atrial fibrillation: Secondary | ICD-10-CM | POA: Diagnosis present

## 2021-05-07 DIAGNOSIS — I160 Hypertensive urgency: Secondary | ICD-10-CM | POA: Diagnosis present

## 2021-05-07 LAB — BASIC METABOLIC PANEL
Anion gap: 8 (ref 5–15)
BUN: 19 mg/dL (ref 8–23)
CO2: 26 mmol/L (ref 22–32)
Calcium: 9.2 mg/dL (ref 8.9–10.3)
Chloride: 94 mmol/L — ABNORMAL LOW (ref 98–111)
Creatinine, Ser: 0.84 mg/dL (ref 0.44–1.00)
GFR, Estimated: >60 mL/min (ref >60)
Glucose, Bld: 117 mg/dL — ABNORMAL HIGH (ref 70–99)
Potassium: 4 mmol/L (ref 3.5–5.1)
Sodium: 128 mmol/L — ABNORMAL LOW (ref 135–145)

## 2021-05-07 LAB — COMPREHENSIVE METABOLIC PANEL
ALT: 14 U/L (ref 0–44)
AST: 25 U/L (ref 15–41)
Albumin: 3.5 g/dL (ref 3.5–5.0)
Alkaline Phosphatase: 111 U/L (ref 38–126)
Anion gap: 8 (ref 5–15)
BUN: 23 mg/dL (ref 8–23)
CO2: 26 mmol/L (ref 22–32)
Calcium: 9.4 mg/dL (ref 8.9–10.3)
Chloride: 90 mmol/L — ABNORMAL LOW (ref 98–111)
Creatinine, Ser: 1.1 mg/dL — ABNORMAL HIGH (ref 0.44–1.00)
GFR, Estimated: 49 mL/min — ABNORMAL LOW (ref >60)
Glucose, Bld: 157 mg/dL — ABNORMAL HIGH (ref 70–99)
Potassium: 4.4 mmol/L (ref 3.5–5.1)
Sodium: 124 mmol/L — ABNORMAL LOW (ref 135–145)
Total Bilirubin: 0.8 mg/dL (ref 0.3–1.2)
Total Protein: 6.1 g/dL — ABNORMAL LOW (ref 6.5–8.1)

## 2021-05-07 LAB — CBC
HCT: 30.3 % — ABNORMAL LOW (ref 36.0–46.0)
Hemoglobin: 10.2 g/dL — ABNORMAL LOW (ref 12.0–15.0)
MCH: 29.8 pg (ref 26.0–34.0)
MCHC: 33.7 g/dL (ref 30.0–36.0)
MCV: 88.6 fL (ref 80.0–100.0)
Platelets: 218 10*3/uL (ref 150–400)
RBC: 3.42 MIL/uL — ABNORMAL LOW (ref 3.87–5.11)
RDW: 13.8 % (ref 11.5–15.5)
WBC: 6.7 10*3/uL (ref 4.0–10.5)
nRBC: 0 % (ref 0.0–0.2)

## 2021-05-07 LAB — VITAMIN D 25 HYDROXY (VIT D DEFICIENCY, FRACTURES): Vit D, 25-Hydroxy: 54.48 ng/mL (ref 30–100)

## 2021-05-07 LAB — TSH: TSH: 0.506 u[IU]/mL (ref 0.350–4.500)

## 2021-05-07 MED ORDER — SODIUM CHLORIDE 0.9 % IV SOLN: INTRAVENOUS | Status: DC

## 2021-05-07 NOTE — Progress Notes (Signed)
PROGRESS NOTE    Debbie Hampton  RAQ:[id number] DOB: March 06, 1932 DOA: 05/06/2021 PCP: Oneita Hurt, No  Outpatient Specialists: cardiology    Brief Narrative:   Debbie Hampton is a 87 y.o. female with medical history significant for atrial fibrillation on chronic anticoagulation therapy, GERD, hypertension who was brought into the ER by EMS after she slid out of her wheelchair and landed on the floor.  She denies hitting her head and denies any loss of consciousness.  Blood pressure was significantly elevated when she arrived to ER. She is able to move her extremities and denies having any urinary or fecal incontinence or saddle anesthesia During my evaluation she appears very uncomfortable and restless but denies having any chest pain, no shortness of breath, no nausea, no vomiting, no abdominal pain, no changes in her bowel habits, no fever, no chills, no cough, no dizziness, no lightheadedness, no headache, no blurred vision no focal deficit.   Assessment & Plan:   Principal Problem:   Compression fracture of T11 vertebra (HCC) Active Problems:   Hyponatremia   Elevated troponin   Atrial fibrillation (HCC)   Hypertensive urgency  # Acute T11 compression fracture No sig retropulsion on x ray. Neurosurg has seen, advises tlso brace, pain control, and outpt neurosurg f/u. No pain at rest - pt/ot consults - f/u vit d - consider osteoporosis tx as outpt  # Hypertensive urgency # HTN Urgency resolved - continue home meds  # Hyponatremia, chronic Chronic problem dating back multiple years. Stable and mild. Home SSRI, lasix, and spironolactone likely contributory/causative. Hx of resistant htn likely explains why on those bp meds - will reduce dose of citalopram and monitor. Outpatient attempt to transition off diuretics advised - cont home atenolol, lasix, spironolactone, lisinopril - given hx pulm nodule several years ago on CT, will repeat  # A-fib Rate controlled - cont home apixaban,  atenolol  # Hx cva Stable - cont home statin  # Hypothyroid Does not appear to be on thyroid med at home - f/u tsh   DVT prophylaxis: home doac Code Status: dnr Family Communication: son updated telephonically 8/22  Level of care: Progressive Cardiac Status is: Observation  The patient will require care spanning > 2 midnights and should be moved to inpatient because: Inpatient level of care appropriate due to severity of illness  Dispo: The patient is from: alf              Anticipated d/c is to: tbd              Patient currently is not medically stable to d/c.   Difficult to place patient No        Consultants:  neurosurgery  Procedures: none  Antimicrobials:  none    Subjective: This morning sitting in bed eating breakfast, no pain. No weakness/numbnes  Objective: Vitals:   05/07/21 0004 05/07/21 0114 05/07/21 0416 05/07/21 0840  BP: (!) 86/53 106/68 133/83 124/78  Pulse: 78 68 85 91  Resp: 18  16 18   Temp: 97.6 F (36.4 C)  (!) 97.5 F (36.4 C)   TempSrc: Oral     SpO2: 97%  97% 96%  Weight:      Height:        Intake/Output Summary (Last 24 hours) at 05/07/2021 0953 Last data filed at 05/07/2021 0500 Gross per 24 hour  Intake 240 ml  Output 200 ml  Net 40 ml   Filed Weights   05/06/21 0045  Weight: 62.1 kg  Examination:  General exam: Appears calm and comfortable  Respiratory system: Clear to auscultation. Respiratory effort normal. Cardiovascular system: S1 & S2 heard, irreg irreg Gastrointestinal system: Abdomen is nondistended, soft and nontender. No organomegaly or masses felt. Normal bowel sounds heard. Central nervous system: Alert and oriented. No focal neurological deficits. Extremities: Symmetric 5 x 5 power. Skin: No rashes, lesions or ulcers. Trace LE edema Psychiatry: demented, calm    Data Reviewed: I have personally reviewed following labs and imaging studies  CBC: Recent Labs  Lab 05/06/21 0302 05/07/21 0538   WBC 9.3 6.7  NEUTROABS 6.5  --   HGB 12.2 10.2*  HCT 36.7 30.3*  MCV 86.4 88.6  PLT 256 218   Basic Metabolic Panel: Recent Labs  Lab 05/06/21 0302 05/07/21 0538  NA 132* 128*  K 4.1 4.0  CL 100 94*  CO2 29 26  GLUCOSE 114* 117*  BUN 18 19  CREATININE 0.93 0.84  CALCIUM 10.0 9.2   GFR: Estimated Creatinine Clearance: 41.7 mL/min (by C-G formula based on SCr of 0.84 mg/dL). Liver Function Tests: No results for input(s): AST, ALT, ALKPHOS, BILITOT, PROT, ALBUMIN in the last 168 hours. No results for input(s): LIPASE, AMYLASE in the last 168 hours. No results for input(s): AMMONIA in the last 168 hours. Coagulation Profile: No results for input(s): INR, PROTIME in the last 168 hours. Cardiac Enzymes: Recent Labs  Lab 05/06/21 0302  CKTOTAL 65   BNP (last 3 results) No results for input(s): PROBNP in the last 8760 hours. HbA1C: No results for input(s): HGBA1C in the last 72 hours. CBG: No results for input(s): GLUCAP in the last 168 hours. Lipid Profile: No results for input(s): CHOL, HDL, LDLCALC, TRIG, CHOLHDL, LDLDIRECT in the last 72 hours. Thyroid Function Tests: No results for input(s): TSH, T4TOTAL, FREET4, T3FREE, THYROIDAB in the last 72 hours. Anemia Panel: No results for input(s): VITAMINB12, FOLATE, FERRITIN, TIBC, IRON, RETICCTPCT in the last 72 hours. Urine analysis:    Component Value Date/Time   COLORURINE YELLOW 05/06/2021 0315   APPEARANCEUR CLEAR 05/06/2021 0315   APPEARANCEUR Clear 11/20/2017 0854   LABSPEC 1.015 05/06/2021 0315   PHURINE 7.0 05/06/2021 0315   GLUCOSEU NEGATIVE 05/06/2021 0315   HGBUR TRACE (A) 05/06/2021 0315   BILIRUBINUR NEGATIVE 05/06/2021 0315   BILIRUBINUR Negative 11/20/2017 0854   KETONESUR NEGATIVE 05/06/2021 0315   PROTEINUR NEGATIVE 05/06/2021 0315   NITRITE NEGATIVE 05/06/2021 0315   LEUKOCYTESUR TRACE (A) 05/06/2021 0315   Sepsis Labs: @LABRCNTIP (procalcitonin:4,lacticidven:4)  ) Recent Results (from  the past 240 hour(s))  Resp Panel by RT-PCR (Flu A&B, Covid) Nasopharyngeal Swab     Status: None   Collection Time: 05/06/21  6:25 AM   Specimen: Nasopharyngeal Swab; Nasopharyngeal(NP) swabs in vial transport medium  Result Value Ref Range Status   SARS Coronavirus 2 by RT PCR NEGATIVE NEGATIVE Final    Comment: (NOTE) SARS-CoV-2 target nucleic acids are NOT DETECTED.  The SARS-CoV-2 RNA is generally detectable in upper respiratory specimens during the acute phase of infection. The lowest concentration of SARS-CoV-2 viral copies this assay can detect is 138 copies/mL. A negative result does not preclude SARS-Cov-2 infection and should not be used as the sole basis for treatment or other patient management decisions. A negative result may occur with  improper specimen collection/handling, submission of specimen other than nasopharyngeal swab, presence of viral mutation(s) within the areas targeted by this assay, and inadequate number of viral copies(<138 copies/mL). A negative result must be combined with clinical  observations, patient history, and epidemiological information. The expected result is Negative.  Fact Sheet for Patients:  BloggerCourse.comhttps://www.fda.gov/media/152166/download  Fact Sheet for Healthcare Providers:  SeriousBroker.ithttps://www.fda.gov/media/152162/download  This test is no t yet approved or cleared by the [place] FDA and  has been authorized for detection and/or diagnosis of SARS-CoV-2 by FDA under an Emergency Use Authorization (EUA). This EUA will remain  in effect (meaning this test can be used) for the duration of the COVID-19 declaration under Section 564(b)(1) of the Act, 21 U.S.C.section 360bbb-3(b)(1), unless the authorization is terminated  or revoked sooner.       Influenza A by PCR NEGATIVE NEGATIVE Final   Influenza B by PCR NEGATIVE NEGATIVE Final    Comment: (NOTE) The Xpert Xpress SARS-CoV-2/FLU/RSV plus assay is intended as an aid in the diagnosis of  influenza from Nasopharyngeal swab specimens and should not be used as a sole basis for treatment. Nasal washings and aspirates are unacceptable for Xpert Xpress SARS-CoV-2/FLU/RSV testing.  Fact Sheet for Patients: BloggerCourse.comhttps://www.fda.gov/media/152166/download  Fact Sheet for Healthcare Providers: SeriousBroker.ithttps://www.fda.gov/media/152162/download  This test is not yet approved or cleared by the [place] FDA and has been authorized for detection and/or diagnosis of SARS-CoV-2 by FDA under an Emergency Use Authorization (EUA). This EUA will remain in effect (meaning this test can be used) for the duration of the COVID-19 declaration under Section 564(b)(1) of the Act, 21 U.S.C. section 360bbb-3(b)(1), unless the authorization is terminated or revoked.  Performed at [facility], [address] [place], [place] [id number]          Radiology Studies: DG Thoracic Spine 2 View  Result Date: 05/06/2021 CLINICAL DATA:  Fall, back pain EXAM: THORACIC SPINE 2 VIEWS COMPARISON:  None. Findings are correlated with CT examination of the chest of 08/22/2018 FINDINGS: The osseous structures are diffusely osteopenic. There is a compression fracture of T11 with approximately 50% loss of height which is new from prior examination and is associated with adjacent paravertebral stripe widening suggesting surrounding edema and/or hemorrhage in the setting of an acute fracture. No listhesis. No retropulsion. Remaining vertebral body height has been preserved. Mild diffuse degenerative disc disease noted throughout the thoracic spine. IMPRESSION: Suspected acute T11 anterior wedge compression fracture with approximately 50% loss of height. No retropulsion. Electronically Signed   By: Helyn NumbersAshesh  Parikh M.D.   On: 05/06/2021 02:23   DG Lumbar Spine Complete  Result Date: 05/06/2021 CLINICAL DATA:  Fall, back pain EXAM: LUMBAR SPINE - COMPLETE 4+ VIEW COMPARISON:  None. FINDINGS: The osseous structures are  diffusely osteopenic. Slightly accentuated lumbar lordosis. Grade 1 anterolisthesis of L4 upon L5. Mild compression deformity of L3 with mild loss of height, likely chronic in nature. No retropulsion. Remaining vertebral body height has been preserved. There is intervertebral disc space narrowing and endplate remodeling at L 2 3 in keeping with changes of moderate degenerative disc disease at this level. Milder degenerative changes are noted at L3-4. Facet arthrosis is noted at L4-5, not well profiled on this examination. The paraspinal soft tissues are unremarkable. IMPRESSION: No definite acute fracture or traumatic listhesis of the lumbar spine. Remote compression deformity of L3. Electronically Signed   By: Helyn NumbersAshesh  Parikh M.D.   On: 05/06/2021 02:26        Scheduled Meds:  apixaban  2.5 mg Oral Q12H   atenolol  50 mg Oral Daily   cholecalciferol  2,000 Units Oral Daily   citalopram  20 mg Oral Daily   furosemide  20 mg Oral  Daily   Gerhardt's butt cream   Topical TID   iron polysaccharides  150 mg Oral Daily   lidocaine  1 patch Transdermal Q24H   lisinopril  10 mg Oral Daily   omega-3 acid ethyl esters  1 g Oral BID   pantoprazole  40 mg Oral Daily   polyethylene glycol  17 g Oral Daily   rosuvastatin  20 mg Oral QHS   tiZANidine  2 mg Oral BID   Continuous Infusions:   LOS: 0 days    Time spent: 40  min    Silvano Bilis, MD [facility]   If 7PM-7AM, please contact night-coverage [email or web address] Password [facility] 05/07/2021, 9:53 AM   ]

## 2021-05-07 NOTE — Progress Notes (Signed)
    Attending Progress Note  History: Debbie Hampton is a 87 y.o female presenting to the ED on 05/06/21 after a fall from her wheelchair with complaints of low back pain. He was found to have a T11 anterior wedge compression fracture  Physical Exam: Vitals:   05/07/21 0114 05/07/21 0416  BP: 106/68 133/83  Pulse: 68 85  Resp:  16  Temp:  (!) 97.5 F (36.4 C)  SpO2:  97%    AA Ox3 CNI  Strength: good strength throughout right upper and lower extremities. 4- throughout left upper and lower extremities 2/2 to history of stroke. Sensation intact throughout. 1+ knee and ankle reflexes.   Data:  Recent Labs  Lab 05/06/21 0302 05/07/21 0538  NA 132* 128*  K 4.1 4.0  CL 100 94*  CO2 29 26  BUN 18 19  CREATININE 0.93 0.84  GLUCOSE 114* 117*  CALCIUM 10.0 9.2   No results for input(s): AST, ALT, ALKPHOS in the last 168 hours.  Invalid input(s): TBILI   Recent Labs  Lab 05/06/21 0302 05/07/21 0538  WBC 9.3 6.7  HGB 12.2 10.2*  HCT 36.7 30.3*  PLT 256 218   No results for input(s): APTT, INR in the last 168 hours.       Other tests/results:   TL spine imaging 05/06/2021 IMPRESSION: Suspected acute T11 anterior wedge compression fracture with approximately 50% loss of height. No retropulsion.     Electronically Signed   By: Helyn Numbers M.D.   On: 05/06/2021 02:23    Assessment/Plan:  Debbie Hampton is a 87 y.o presenting from outside nursing facility after a fall from her wheelchair. Imaging concerning for T11 wedge compression fracture.   - TLSO brace at bedside - mobilize with PT in TLSO brace - pain control - no role for acute neurosurgical intervention at this time. Will follow up outpatient.   Manning Charity PA-C Department of Neurosurgery

## 2021-05-07 NOTE — Evaluation (Signed)
Physical Therapy Evaluation Patient Details Name: Debbie Hampton MRN: [id number] DOB: Jun 11, 1932 Today's Date: 05/07/2021   History of Present Illness  Pt is an 87 y/o F admitted on 05/06/21 after presenting to ER by EMS after she slid out of her w/c & landed on the floor. Pt foundt o have acute T11 compression fx with neurosurgery advising TLSO brace. PMH: a-fib on chronic anticoagulation therapy, GERD, HTN, CVA with L hemiparesis  Clinical Impression  Pt seen for PT evaluation with pt agreeable to tx. PT attempts to educate pt on log rolling to protect back but pt [facility] & with difficulty understanding. Pt requires max assist for bed mobility & c/o significantly increased back pain with all movement & static sitting EOB. PT educates pt on TLSO & need to wear it OOB with pt donning it total assist. Pt reports increased discomfort & fatigue so PT doffs TLSO total assist. Pt requires +2 assist to scoot to [facility]. Pt would benefit from STR upon d/c to maximize independence with functional mobility & reduce fall risk. Pt would benefit from ongoing acute PT treatment to address strength and gait with LRAD.       Follow Up Recommendations SNF;Supervision/Assistance - 24 hour    Equipment Recommendations  None recommended by PT    Recommendations for Other Services       Precautions / Restrictions Precautions Precautions: Fall;Back Required Braces or Orthoses: Spinal Brace Spinal Brace: Thoracolumbosacral orthotic;Other (comment) Spinal Brace Comments: when OOB Restrictions Weight Bearing Restrictions: No      Mobility  Bed Mobility Overal bed mobility: Needs Assistance Bed Mobility: Rolling;Sidelying to Sit;Sit to Supine Rolling: Max assist Sidelying to sit: Max assist   Sit to supine: Max assist   General bed mobility comments: PT attempts to educate pt on log rolling technique but pt limited by [facility] & pain with movement    Transfers Overall transfer level:  (not attempted, pt  requested to lie back down 2/2 increased pain sitting EOB)                  Ambulation/Gait                Stairs            Wheelchair Mobility    Modified Rankin (Stroke Patients Only)       Balance Overall balance assessment: Needs assistance Sitting-balance support: Bilateral upper extremity supported;Feet supported Sitting balance-Leahy Scale: Fair Sitting balance - Comments: close supervision sitting EOB                                     Pertinent Vitals/Pain Pain Assessment: Faces Faces Pain Scale: Hurts whole lot Pain Location: back Pain Descriptors / Indicators: Discomfort Pain Intervention(s): Limited activity within patient's tolerance;Monitored during session;Repositioned;Patient requesting pain meds-RN notified    Home Living Family/patient expects to be discharged to:: Assisted living               Home Equipment: Walker - 4 wheels;Wheelchair - manual Additional Comments: Resident of [name]    Prior Function           Comments: Ambulatory to dining hall, used w/c in her room     Hand Dominance        Extremity/Trunk Assessment   Upper Extremity Assessment Upper Extremity Assessment: Generalized weakness    Lower Extremity Assessment Lower Extremity Assessment: Generalized weakness  Communication   Communication: HOH (broken speech (unsure if pt has trouble breathing, although SpO2 >90% on room air, if it's due to pain, or if this is residual 2/2 hx of CVA))  Cognition Arousal/Alertness: Awake/alert Behavior During Therapy: WFL for tasks assessed/performed Overall Cognitive Status: No family/caregiver present to determine baseline cognitive functioning                                 General Comments: Pleasant woman, able to follow simple commands. Wants to ensure her son [name]) who is POA & financial POA is made aware of all things happening to her.      General  Comments      Exercises     Assessment/Plan    PT Assessment Patient needs continued PT services  PT Problem List Decreased strength;Decreased mobility;Decreased safety awareness;Decreased knowledge of precautions;Decreased activity tolerance;Decreased balance;Decreased knowledge of use of DME;Pain       PT Treatment Interventions DME instruction;Therapeutic activities;Modalities;Gait training;Therapeutic exercise;Patient/family education;Wheelchair mobility training;Functional mobility training;Neuromuscular re-education;Manual techniques;Balance training    PT Goals (Current goals can be found in the Care Plan section)  Acute Rehab PT Goals Patient Stated Goal: decreased pain PT Goal Formulation: With patient Time For Goal Achievement: 05/21/21 Potential to Achieve Goals: Good    Frequency Min 2X/week   Barriers to discharge        Co-evaluation               AM-PAC PT "6 Clicks" Mobility  Outcome Measure Help needed turning from your back to your side while in a flat bed without using bedrails?: Total Help needed moving from lying on your back to sitting on the side of a flat bed without using bedrails?: Total Help needed moving to and from a bed to a chair (including a wheelchair)?: Total Help needed standing up from a chair using your arms (e.g., wheelchair or bedside chair)?: Total Help needed to walk in hospital room?: Total Help needed climbing 3-5 steps with a railing? : Total 6 Click Score: 6    End of Session Equipment Utilized During Treatment: Back brace Activity Tolerance: Patient limited by fatigue;Patient limited by pain Patient left: in bed;with call bell/phone within reach;with bed alarm set;with nursing/sitter in room Nurse Communication: Mobility status PT Visit Diagnosis: Muscle weakness (generalized) (M62.81);Difficulty in walking, not elsewhere classified (R26.2);Pain Pain - part of body:  (back)    Time: 4403-4742 PT Time Calculation  (min) (ACUTE ONLY): 19 min   Charges:   PT Evaluation $PT Eval Moderate Complexity: 1 Mod PT Treatments $Therapeutic Activity: 8-22 mins        Aleda Grana, PT, DPT 05/07/21, 11:41 AM   Sandi Mariscal 05/07/2021, 11:39 AM

## 2021-05-07 NOTE — [place] FL2 (Signed)
[place] MEDICAID FL2 LEVEL OF CARE SCREENING TOOL     IDENTIFICATION  Patient Name: Debbie Hampton Birthdate: 1931/10/02 Sex: female Admission Date (Current Location): 05/06/2021  [facility] and [place] Number:  Chiropodist and Address:  [facility], [address] [place], [place] [address]      Provider Number: [id number]  Attending Physician Name and Address:  Kathrynn Running, MD  Relative Name and Phone Number:       Current Level of Care: Hospital Recommended Level of Care: Skilled Nursing Facility Prior Approval Number:    Date Approved/Denied:   PASRR Number: [id number]  Discharge Plan: SNF    Current Diagnoses: Patient Active Problem List   Diagnosis Date Noted   Hypothyroidism 05/07/2021   Type 2 diabetes mellitus without complications (HCC) 05/07/2021   Compression fracture of body of thoracic vertebra (HCC) 05/07/2021   Hypertensive urgency 05/06/2021   Compression fracture of T11 vertebra (HCC) 05/06/2021   History of stroke 12/06/2019   Atrial fibrillation, controlled (HCC) 12/06/2019   Atrial fibrillation (HCC)    Hypertension    Elevated troponin 11/26/2019   Closed Colles' fracture of left radius 10/03/2017   Labile blood pressure    Benign essential HTN    Hypertensive crisis    Hyponatremia    Prediabetes    Hypoalbuminemia due to protein-calorie malnutrition (HCC)    Acute blood loss anemia    Stroke (cerebrum) (HCC) 08/01/2017   Acute CVA (cerebrovascular accident) (HCC) 07/29/2017   Syncope, near 03/31/2016   Gastro-esophageal reflux disease without esophagitis 02/15/2014    Orientation RESPIRATION BLADDER Height & Weight     Self, Time, Situation, Place  Normal Incontinent Weight: 136 lb 14.5 oz (62.1 kg) Height:  5\' 2"  (157.5 cm)  BEHAVIORAL SYMPTOMS/MOOD NEUROLOGICAL BOWEL NUTRITION STATUS      Incontinent Diet (DYS 3 diet, thin liquids)  AMBULATORY STATUS COMMUNICATION OF NEEDS Skin    Extensive Assist Verbally Normal                       Personal Care Assistance Level of Assistance  Bathing, Feeding, Dressing Bathing Assistance: Maximum assistance Feeding assistance: Independent Dressing Assistance: Maximum assistance     Functional Limitations Info  Sight, Speech, Hearing Sight Info: Adequate Hearing Info: Adequate Speech Info: Adequate    SPECIAL CARE FACTORS FREQUENCY  PT (By licensed PT), OT (By licensed OT)     PT Frequency: 5x OT Frequency: 5x            Contractures      Additional Factors Info  Code Status, Allergies Code Status Info: dnr Allergies Info: Cheese, Mushroom Extract Complex, Penicillins, Verapamil, Aloe, Amlodipine, Cephalexin, Metoprolol, Miconazole, Neosporin (Neomycin-bacitracin Zn-polymyx), Trandolapril-verapamil Hcl Er           Current Medications (05/07/2021):  This is the current hospital active medication list Current Facility-Administered Medications  Medication Dose Route Frequency Provider Last Rate Last Admin   acetaminophen (TYLENOL) tablet 500 mg  500 mg Oral Q8H PRN Agbata, Tochukwu, MD       apixaban (ELIQUIS) tablet 2.5 mg  2.5 mg Oral Q12H Agbata, Tochukwu, MD   2.5 mg at 05/07/21 0951   atenolol (TENORMIN) tablet 50 mg  50 mg Oral Daily Agbata, Tochukwu, MD   50 mg at 05/07/21 05/09/21   cholecalciferol (VITAMIN D) tablet 2,000 Units  2,000 Units Oral Daily Agbata, Tochukwu, MD   2,000 Units at 05/07/21 0951   citalopram (CELEXA) tablet 20  mg  20 mg Oral Daily Agbata, Tochukwu, MD   20 mg at 05/07/21 0951   furosemide (LASIX) tablet 20 mg  20 mg Oral Daily Agbata, Tochukwu, MD   20 mg at 05/07/21 2836   Gerhardt's butt cream   Topical TID Rico Junker, [facility]       HYDROcodone-acetaminophen (NORCO/VICODIN) 5-325 MG per tablet 2 tablet  2 tablet Oral Q6H PRN Agbata, Tochukwu, MD   2 tablet at 05/07/21 1034   iron polysaccharides (NIFEREX) capsule 150 mg  150 mg Oral Daily Agbata, Tochukwu, MD   150 mg at  05/07/21 0952   lidocaine (LIDODERM) 5 % 1 patch  1 patch Transdermal Q24H Agbata, Tochukwu, MD   1 patch at 05/07/21 0959   lisinopril (ZESTRIL) tablet 10 mg  10 mg Oral Daily Agbata, Tochukwu, MD   10 mg at 05/07/21 6294   omega-3 acid ethyl esters (LOVAZA) capsule 1 g  1 g Oral BID Rico Junker, RPH   1 g at 05/07/21 0951   ondansetron (ZOFRAN) tablet 4 mg  4 mg Oral Q6H PRN Agbata, Tochukwu, MD       Or   ondansetron (ZOFRAN) injection 4 mg  4 mg Intravenous Q6H PRN Agbata, Tochukwu, MD   4 mg at 05/07/21 1142   pantoprazole (PROTONIX) EC tablet 40 mg  40 mg Oral Daily Agbata, Tochukwu, MD   40 mg at 05/07/21 0951   polyethylene glycol (MIRALAX / GLYCOLAX) packet 17 g  17 g Oral Daily Agbata, Tochukwu, MD   17 g at 05/07/21 0956   polyvinyl alcohol (LIQUIFILM TEARS) 1.4 % ophthalmic solution 1 drop  1 drop Both Eyes PRN Rico Junker, RPH       rosuvastatin (CRESTOR) tablet 20 mg  20 mg Oral QHS Agbata, Tochukwu, MD   20 mg at 05/06/21 2224   tiZANidine (ZANAFLEX) tablet 2 mg  2 mg Oral BID Agbata, Tochukwu, MD   2 mg at 05/07/21 7654     Discharge Medications: Please see discharge summary for a list of discharge medications.  Relevant Imaging Results:  Relevant Lab Results:   Additional Information SSN:[id number]  Reuel Boom [name], LCSW

## 2021-05-07 NOTE — Progress Notes (Addendum)
   05/07/21 2004  Assess: MEWS Score  Temp 98.3 F (36.8 C)  BP (!) 159/134  Pulse Rate 100  Resp 18  SpO2 97 %  O2 Device Room Air  Assess: MEWS Score  MEWS Temp 0  MEWS Systolic 0  MEWS Pulse 0  MEWS RR 0  MEWS LOC 0  MEWS Score 0  MEWS Score Color Green  Assess: SIRS CRITERIA  SIRS Temperature  0  SIRS Pulse 1  SIRS Respirations  0  SIRS WBC 0  SIRS Score Sum  1  Pt is drowsy and sleeping on the bed, starts to yell and not able to talk in clear voice. Alert but Disoriented x4. Notified the NP, Ordered STAT CT. Will keep monitoring.

## 2021-05-07 NOTE — Evaluation (Signed)
Clinical/Bedside Swallow Evaluation Patient Details  Name: Debbie Hampton MRN: [id number] Date of Birth: 06-30-1932  Today's Date: 05/07/2021 Time: SLP Start Time (ACUTE ONLY): 1250 SLP Stop Time (ACUTE ONLY): 1325 SLP Time Calculation (min) (ACUTE ONLY): 35 min  Past Medical History:  Past Medical History:  Diagnosis Date   Atrial fibrillation (HCC)    Bilateral shoulder pain    Colon polyp    Constipation    GERD (gastroesophageal reflux disease)    Hypertension    Impaired fasting glucose    Syncope 03/2016   with [facility]   Varicose veins of both lower extremities    Past Surgical History:  Past Surgical History:  Procedure Laterality Date   TONSILLECTOMY     TUMOR REMOVAL     ovaries   HPI:  Per admitting H&P "Debbie Hampton is a 87 y.o. female with medical history significant for atrial fibrillation on chronic anticoagulation therapy, GERD, hypertension who was brought into the ER by EMS after she slid out of her wheelchair and landed on the floor.  She denies hitting her head and denies any loss of consciousness.  Blood pressure was significantly elevated when she arrived to ER.  She is able to move her extremities and denies having any urinary or fecal incontinence or saddle anesthesia  During my evaluation she appears very uncomfortable and restless but denies having any chest pain, no shortness of breath, no nausea, no vomiting, no abdominal pain, no changes in her bowel habits, no fever, no chills, no cough, no dizziness, no lightheadedness, no headache, no blurred vision no focal deficit.  Labs show sodium 132, potassium 4.1, chloride 100, bicarb 29, glucose 114, BUN 18, creatinine 0.93, calcium 10.0, total CK 65, white count 9.3, hemoglobin 12.2, hematocrit 36.7, MCV 86.4, RDW 13.7, platelet count 256  Urinalysis is sterile  Respiratory viral panel is negative  X-ray of thoracic spine shows suspected acute T11 anterior wedge compression fracture with approximately 50% loss of  height. No retropulsion.  Lumbar spine x-ray shows no definite acute fracture or traumatic listhesis of the lumbar  spine. Remote compression deformity of L3.  Twelve-lead EKG reviewed by me shows atrial fibrillation with LVH"   Assessment / Plan / Recommendation Clinical Impression  Pt presents with mild dysphagia but no overt s/s of aspiration at bedside today.Oral mech exam revealed structures to be functioning adequately. Pt was admitted with back fracture after sliding out of a chair. Pt was positioned at 45 degree angle with 2 pillows behind her head for maximum comfort as well as safety with swallowing. Pt tolerated thin liquids, purees, and soft solids without s/s of aspiration. Pt was noted to belch whenever she took more than one sip at a time and also pointed to her chest reporting heart burn. Pt does have an order for Protonix that was started this am. Cues to take single sips allowing for rest between sips alleviated the belching and heart burn. Pt was noted to have a slow oral tranist with solids and mild oral resiude after the swallow. Pt tolerated the spagetti from her lunch tray much better when it was chopped fine. Rec Dys 3 diet with chopped meats. ST to follow up with toleratio of diet and possibly downgrade to Dys 2 chopped if needed. Pt does fatigue easily and PO intake is limited. St to follow SLP Visit Diagnosis: Dysphagia, oropharyngeal phase (R13.12)    Aspiration Risk  Mild aspiration risk    Diet Recommendation Dysphagia 3 (Mech soft)  Liquid Administration via: Straw Medication Administration: Whole meds with puree Supervision: Staff to assist with self feeding Compensations: Minimize environmental distractions;Slow rate;Small sips/bites;Other (Comment) (Allow periods of rest) Postural Changes: Other (Comment) (Position at 45 degree when ewating in bed (as tolerated) with 2 pillow behind he head)    Other  Recommendations     Follow up Recommendations   ST to follow       Frequency and Duration     Follow up with toleration of diet.       Prognosis Barriers to Reach Goals: Cognitive deficits;Other (Comment) (Pt is very HOH)      Swallow Study   General Date of Onset: 05/06/21 HPI: Per admitting H&P "Debbie Hampton is a 87 y.o. female with medical history significant for atrial fibrillation on chronic anticoagulation therapy, GERD, hypertension who was brought into the ER by EMS after she slid out of her wheelchair and landed on the floor.  She denies hitting her head and denies any loss of consciousness.  Blood pressure was significantly elevated when she arrived to ER.  She is able to move her extremities and denies having any urinary or fecal incontinence or saddle anesthesia  During my evaluation she appears very uncomfortable and restless but denies having any chest pain, no shortness of breath, no nausea, no vomiting, no abdominal pain, no changes in her bowel habits, no fever, no chills, no cough, no dizziness, no lightheadedness, no headache, no blurred vision no focal deficit.  Labs show sodium 132, potassium 4.1, chloride 100, bicarb 29, glucose 114, BUN 18, creatinine 0.93, calcium 10.0, total CK 65, white count 9.3, hemoglobin 12.2, hematocrit 36.7, MCV 86.4, RDW 13.7, platelet count 256  Urinalysis is sterile  Respiratory viral panel is negative  X-ray of thoracic spine shows suspected acute T11 anterior wedge compression fracture with approximately 50% loss of height. No retropulsion.  Lumbar spine x-ray shows no definite acute fracture or traumatic listhesis of the lumbar  spine. Remote compression deformity of L3.  Twelve-lead EKG reviewed by me shows atrial fibrillation with LVH" Type of Study: Bedside Swallow Evaluation Diet Prior to this Study: Regular Temperature Spikes Noted: No Respiratory Status: Room air History of Recent Intubation: No Behavior/Cognition: Alert;Cooperative;Pleasant mood;Other (Comment) (Very HOH, has hearing aid on the  left) Oral Cavity Assessment: Within Functional Limits Oral Care Completed by SLP: No Oral Cavity - Dentition: Adequate natural dentition;Missing dentition Vision: Functional for self-feeding Self-Feeding Abilities: Needs assist;Needs set up Patient Positioning: Partially reclined;Other (comment) (Compressison fx) Baseline Vocal Quality: Normal    Oral/Motor/Sensory Function Overall Oral Motor/Sensory Function: Within functional limits   Ice Chips Ice chips: Not tested   Thin Liquid Thin Liquid: Within functional limits Presentation: Cup;Straw    Nectar Thick Nectar Thick Liquid: Not tested   Honey Thick Honey Thick Liquid: Not tested   Puree Puree: Within functional limits   Solid     Solid: Impaired Presentation: Spoon Oral Phase Impairments: Impaired mastication Oral Phase Functional Implications: Prolonged oral transit;Oral residue      Eather Colas 05/07/2021,3:32 PM

## 2021-05-07 NOTE — Evaluation (Signed)
Occupational Therapy Evaluation Patient Details Name: Debbie Hampton MRN: [id number] DOB: 01/22/1932 Today's Date: 05/07/2021    History of Present Illness Pt is an 87 y/o F admitted on 05/06/21 after presenting to ER by EMS after she slid out of her w/c & landed on the floor. Pt foundt o have acute T11 compression fx with neurosurgery advising TLSO brace. PMH: a-fib on chronic anticoagulation therapy, GERD, HTN, CVA with L hemiparesis   Clinical Impression   Ms Rogue Bussing was seen for OT evaluation this date. Prior to hospital admission, pt was MOD I for mobility using 4WW and w/c as needed. Pt lives at [facility]. Pt presents to acute OT demonstrating impaired ADL performance and functional mobility 2/2 decreased activity tolerance, pain, and functional strength/balance deficits. Pt currently requires MAX A don brace seated EOB. MAX A for log rolling to EOB, tolerated <5 min sitting reporting 8/10 pain and requesting return to bed. MIN A self-drinking at bed level. Pt would benefit from skilled OT to address noted impairments to maximize safety and independence. Upon hospital discharge, recommend STR to maximize pt safety and return to PLOF.     Follow Up Recommendations  SNF    Equipment Recommendations  Other (comment) (TBD)    Recommendations for Other Services       Precautions / Restrictions Precautions Precautions: Fall;Back Required Braces or Orthoses: Spinal Brace Spinal Brace: Thoracolumbosacral orthotic Spinal Brace Comments: when OOB Restrictions Weight Bearing Restrictions: No      Mobility Bed Mobility Overal bed mobility: Needs Assistance Bed Mobility: Rolling;Sidelying to Sit;Sit to Supine Rolling: Max assist Sidelying to sit: Max assist   Sit to supine: Max assist   General bed mobility comments: limited carryover    Transfers Overall transfer level:  (not attempted, pt requested to lie back down 2/2 increased pain sitting EOB)                General transfer comment: deferred 2/2 pain and poor brace fitting    Balance Overall balance assessment: Needs assistance Sitting-balance support: Bilateral upper extremity supported;Feet supported Sitting balance-Leahy Scale: Fair Sitting balance - Comments: close supervision sitting EOB                                   ADL either performed or assessed with clinical judgement   ADL Overall ADL's : Needs assistance/impaired                                       General ADL Comments: MAX A don brace seated EOB. MAX A for log rolling to EOB, tolerated <5 min sitting reporting 8/10 pain and requesting return to bed. MIN A self-drinking at bed level      Pertinent Vitals/Pain Pain Assessment: 0-10 Pain Score: 8  Faces Pain Scale: Hurts whole lot Pain Location: back Pain Descriptors / Indicators: Discomfort;Moaning Pain Intervention(s): Repositioned;Patient requesting pain meds-RN notified;Limited activity within patient's tolerance     Hand Dominance Right   Extremity/Trunk Assessment Upper Extremity Assessment Upper Extremity Assessment: Generalized weakness   Lower Extremity Assessment Lower Extremity Assessment: Generalized weakness       Communication Communication Communication: HOH   Cognition Arousal/Alertness: Awake/alert Behavior During Therapy: WFL for tasks assessed/performed Overall Cognitive Status: No family/caregiver present to determine baseline cognitive functioning  General Comments: Pleasant woman, able to follow simple commands. Wants to ensure her son [name]) who is POA & financial POA is made aware of all things happening to her.   General Comments       Exercises Exercises: Other exercises Other Exercises Other Exercises: Pt educated re: OT role, DME recs, d/c recs, falls prevention, ECS Other Exercises: LBD, UBD, don/doff brace, log rolling, perihygiene, sitting  balance/tolerance   Shoulder Instructions      Home Living Family/patient expects to be discharged to:: Assisted living                             Home Equipment: Walker - 4 wheels;Wheelchair - manual   Additional Comments: Resident of [name]      Prior Functioning/Environment Level of Independence: Independent with assistive device(s)        Comments: Ambulatory to dining hall, used w/c in her room        OT Problem List: Decreased strength;Decreased range of motion;Decreased activity tolerance;Impaired balance (sitting and/or standing);Decreased safety awareness;Decreased knowledge of precautions      OT Treatment/Interventions: Self-care/ADL training;Therapeutic exercise;Energy conservation;DME and/or AE instruction;Therapeutic activities;Patient/family education;Balance training    OT Goals(Current goals can be found in the care plan section) Acute Rehab OT Goals Patient Stated Goal: decreased pain OT Goal Formulation: With patient Time For Goal Achievement: 05/21/21 Potential to Achieve Goals: Good ADL Goals Pt Will Perform Grooming: with set-up;with supervision;sitting Pt Will Perform Upper Body Dressing: with min assist;sitting;with caregiver independent in assisting Pt Will Transfer to Toilet: with min assist;stand pivot transfer;bedside commode (c LRAD PRN)  OT Frequency: Min 2X/week    AM-PAC OT "6 Clicks" Daily Activity     Outcome Measure Help from another person eating meals?: A Little Help from another person taking care of personal grooming?: A Little Help from another person toileting, which includes using toliet, bedpan, or urinal?: A Lot Help from another person bathing (including washing, rinsing, drying)?: A Lot Help from another person to put on and taking off regular upper body clothing?: A Lot Help from another person to put on and taking off regular lower body clothing?: A Lot 6 Click Score: 14   End of Session Nurse  Communication: Patient requests pain meds  Activity Tolerance: Patient limited by pain;Patient tolerated treatment well Patient left: in bed;with call bell/phone within reach;with bed alarm set  OT Visit Diagnosis: Other abnormalities of gait and mobility (R26.89)                Time: 1224-4975 OT Time Calculation (min): 20 min Charges:  OT General Charges $OT Visit: 1 Visit OT Evaluation $OT Eval Low Complexity: 1 Low OT Treatments $Self Care/Home Management : 8-22 mins  Kathie Dike, M.S. OTR/L  05/07/21, 2:12 PM  ascom [phone]

## 2021-05-08 DIAGNOSIS — S22080K Wedge compression fracture of T11-T12 vertebra, subsequent encounter for fracture with nonunion: Secondary | ICD-10-CM | POA: Diagnosis not present

## 2021-05-08 LAB — BASIC METABOLIC PANEL
Anion gap: 8 (ref 5–15)
BUN: 23 mg/dL (ref 8–23)
CO2: 24 mmol/L (ref 22–32)
Calcium: 9.5 mg/dL (ref 8.9–10.3)
Chloride: 94 mmol/L — ABNORMAL LOW (ref 98–111)
Creatinine, Ser: 1.06 mg/dL — ABNORMAL HIGH (ref 0.44–1.00)
GFR, Estimated: 51 mL/min — ABNORMAL LOW (ref >60)
Glucose, Bld: 112 mg/dL — ABNORMAL HIGH (ref 70–99)
Potassium: 4.8 mmol/L (ref 3.5–5.1)
Sodium: 126 mmol/L — ABNORMAL LOW (ref 135–145)

## 2021-05-08 LAB — OSMOLALITY, URINE: Osmolality, Ur: 201 mOsm/kg — ABNORMAL LOW (ref 300–900)

## 2021-05-08 LAB — GLUCOSE, CAPILLARY
Glucose-Capillary: 104 mg/dL — ABNORMAL HIGH (ref 70–99)
Glucose-Capillary: 119 mg/dL — ABNORMAL HIGH (ref 70–99)

## 2021-05-08 LAB — SODIUM, URINE, RANDOM: Sodium, Ur: 12 mmol/L

## 2021-05-08 LAB — OSMOLALITY: Osmolality: 276 mOsm/kg (ref 275–295)

## 2021-05-08 MED ORDER — SENNA 8.6 MG PO TABS
2.0000 | ORAL_TABLET | Freq: Every day | ORAL | Status: DC
  Administered 2021-05-08: 17.2 mg via ORAL
  Filled 2021-05-08: qty 2

## 2021-05-08 MED ORDER — OXYCODONE HCL 5 MG PO TABS
5.0000 mg | ORAL_TABLET | Freq: Four times a day (QID) | ORAL | Status: DC | PRN
  Administered 2021-05-08 – 2021-05-09 (×2): 5 mg via ORAL
  Filled 2021-05-08 (×2): qty 1

## 2021-05-08 MED ORDER — CITALOPRAM HYDROBROMIDE 20 MG PO TABS
10.0000 mg | ORAL_TABLET | Freq: Every day | ORAL | Status: DC
  Administered 2021-05-09: 10 mg via ORAL
  Filled 2021-05-08: qty 1

## 2021-05-08 MED ORDER — ATENOLOL 100 MG PO TABS
100.0000 mg | ORAL_TABLET | Freq: Every day | ORAL | Status: DC
  Administered 2021-05-09: 100 mg via ORAL
  Filled 2021-05-08: qty 1

## 2021-05-08 MED ORDER — LISINOPRIL 20 MG PO TABS
20.0000 mg | ORAL_TABLET | Freq: Every day | ORAL | Status: DC
  Administered 2021-05-09: 20 mg via ORAL
  Filled 2021-05-08: qty 1

## 2021-05-08 MED ORDER — ACETAMINOPHEN 325 MG PO TABS
650.0000 mg | ORAL_TABLET | Freq: Four times a day (QID) | ORAL | Status: DC | PRN
  Administered 2021-05-08: 650 mg via ORAL
  Filled 2021-05-08: qty 2

## 2021-05-08 MED ORDER — ACETAMINOPHEN 500 MG PO TABS: 500.0000 mg | ORAL_TABLET | Freq: Four times a day (QID) | ORAL | Status: DC | PRN

## 2021-05-08 NOTE — Progress Notes (Signed)
Cross Cover Note Nurse reported changes in baseline neuro status approx 2030 05/07/21 Bedside patient with garb;led speech and inability to follow dimple commands, left facial labial flattening, assumed no new as with hx of prior CVA and recently with dysphagia diagnosis Last known narcotic use 1000 8/22 Known hyponatremia relatively stable per lab review  CT head w/o contrast - no findings of acute CVA  Repeat chemistries showed worsening hyponatremia and AKI. Likely dehydration Started NS at 75/hr. Repeat chemistries already scheduled Narcotics have been discontinued and tylenol dose increaed to manage her apin

## 2021-05-08 NOTE — Progress Notes (Signed)
Physical Therapy Treatment Patient Details Name: Debbie Hampton MRN: [id number] DOB: March 23, 1932 Today's Date: 05/08/2021    History of Present Illness Pt is an 87 y/o F admitted on 05/06/21 after presenting to ER by EMS after she slid out of her w/c & landed on the floor. Pt foundt o have acute T11 compression fx with neurosurgery advising TLSO brace. PMH: a-fib on chronic anticoagulation therapy, GERD, HTN, CVA with L hemiparesis    PT Comments    Pt is making progress towards goals with ability to tolerate multiple bouts of rolling for ADLs. Pt then able to sit at EOB and PT donned and adjusted TLSO. Appears to tolerate this date. Able to stand, but unsteady and unable to take formal steps. Worked on weight shifting, however pt fatigued. Ultimately returned to bed with +2 required for positioning. Pt remains confused, however able to follow commands and educated on back precautions including log rolling. Updated frequency. Will continue to progress as able.  Follow Up Recommendations  SNF     Equipment Recommendations  None recommended by PT    Recommendations for Other Services       Precautions / Restrictions Precautions Precautions: Fall;Back Required Braces or Orthoses: Spinal Brace Spinal Brace: Thoracolumbosacral orthotic Spinal Brace Comments: when OOB Restrictions Weight Bearing Restrictions: No    Mobility  Bed Mobility Overal bed mobility: Needs Assistance Bed Mobility: Rolling;Supine to Sit Rolling: Mod assist   Supine to sit: Mod assist Sit to supine: Max assist;+2 for safety/equipment   General bed mobility comments: cues for log rolling. Once seated, able to maintain balance while therapist adjusted TLSO brace. Required +2 assist for scooting up towards [facility]    Transfers Overall transfer level: Needs assistance Equipment used: Rolling walker (2 wheeled) Transfers: Sit to/from Stand Sit to Stand: Mod assist         General transfer comment: needs assist  for momentum. once standing, post bias noted. Able to stand and weightshift however unable to take steps at this time. Fatigues quickly and required to sit back down on bed. Multiple stands performed for bed linen change due to incontience.  Ambulation/Gait             General Gait Details: attempted, however unable   Stairs             Wheelchair Mobility    Modified Rankin (Stroke Patients Only)       Balance Overall balance assessment: Needs assistance Sitting-balance support: Bilateral upper extremity supported;Feet supported Sitting balance-Leahy Scale: Fair     Standing balance support: Bilateral upper extremity supported Standing balance-Leahy Scale: Poor                              Cognition Arousal/Alertness: Awake/alert Behavior During Therapy: WFL for tasks assessed/performed Overall Cognitive Status: History of cognitive impairments - at baseline                                 General Comments: pleasant and agreeable to session, HOH      Exercises Other Exercises Other Exercises: Multiple bouts of rolling performed for bed linen adjustment. Mod assist required. +2 for bathing with pt able to participate minimally.    General Comments        Pertinent Vitals/Pain Pain Assessment: Faces Faces Pain Scale: Hurts little more Pain Location: back Pain Descriptors / Indicators: Discomfort;Moaning Pain Intervention(s):  Limited activity within patient's tolerance;Premedicated before session;Repositioned    Home Living                      Prior Function            PT Goals (current goals can now be found in the care plan section) Acute Rehab PT Goals Patient Stated Goal: decreased pain PT Goal Formulation: With patient Time For Goal Achievement: 05/21/21 Potential to Achieve Goals: Good Progress towards PT goals: Progressing toward goals    Frequency    7X/week      PT Plan Frequency needs to be  updated    Co-evaluation              AM-PAC PT "6 Clicks" Mobility   Outcome Measure  Help needed turning from your back to your side while in a flat bed without using bedrails?: A Lot Help needed moving from lying on your back to sitting on the side of a flat bed without using bedrails?: A Lot Help needed moving to and from a bed to a chair (including a wheelchair)?: Total Help needed standing up from a chair using your arms (e.g., wheelchair or bedside chair)?: Total Help needed to walk in hospital room?: Total Help needed climbing 3-5 steps with a railing? : Total 6 Click Score: 8    End of Session Equipment Utilized During Treatment: Back brace Activity Tolerance: Patient limited by fatigue;Patient limited by pain Patient left: in bed;with call bell/phone within reach;with bed alarm set;with nursing/sitter in room Nurse Communication: Mobility status PT Visit Diagnosis: Muscle weakness (generalized) (M62.81);Difficulty in walking, not elsewhere classified (R26.2);Pain Pain - part of body:  (back)     Time: 6546-5035 PT Time Calculation (min) (ACUTE ONLY): 32 min  Charges:  $Therapeutic Exercise: 8-22 mins $Therapeutic Activity: 8-22 mins                     Elizabeth Palau, PT, DPT [phone]    Berklee Battey 05/08/2021, 2:53 PM

## 2021-05-08 NOTE — TOC Initial Note (Signed)
Transition of Care [facility]) - Initial/Assessment Note    Patient Details  Name: Debbie Hampton MRN: [id number] Date of Birth: April 25, 1932  Transition of Care [facility]) CM/SW Contact:    Maree Krabbe, LCSW Phone Number: 05/08/2021, 12:57 PM  Clinical Narrative:    CSW spoke with pt's son and pt's son states they would like for pt to go to [facility] CSW has sent referral and they will accept pt. Insurance Berkley Harvey has been started. Pt's son updated.               Expected Discharge Plan: Skilled Nursing Facility Barriers to Discharge: Continued Medical Work up   Patient Goals and CMS Choice Patient states their goals for this hospitalization and ongoing recovery are:: for pt to go to snf   Choice offered to / list presented to : Adult Children  Expected Discharge Plan and Services Expected Discharge Plan: Skilled Nursing Facility In-house Referral: NA   Post Acute Care Choice: Skilled Nursing Facility Living arrangements for the past 2 months: Single Family Home                                      Prior Living Arrangements/Services Living arrangements for the past 2 months: Single Family Home Lives with:: Self Patient language and need for interpreter reviewed:: Yes Do you feel safe going back to the place where you live?: Yes      Need for Family Participation in Patient Care: Yes (Comment) Care giver support system in place?: Yes (comment)   Criminal Activity/Legal Involvement Pertinent to Current Situation/Hospitalization: No - Comment as needed  Activities of Daily Living Home Assistive Devices/Equipment: Wheelchair ADL Screening (condition at time of admission) Patient's cognitive ability adequate to safely complete daily activities?: Yes Is the patient deaf or have difficulty hearing?: Yes Does the patient have difficulty seeing, even when wearing glasses/contacts?: No Does the patient have difficulty concentrating, remembering, or making decisions?: No Patient  able to express need for assistance with ADLs?: Yes Does the patient have difficulty dressing or bathing?: Yes Independently performs ADLs?: No Communication: Independent Dressing (OT): Needs assistance Is this a change from baseline?: Pre-admission baseline Grooming: Needs assistance Is this a change from baseline?: Pre-admission baseline Feeding: Needs assistance Is this a change from baseline?: Pre-admission baseline Bathing: Needs assistance Is this a change from baseline?: Pre-admission baseline Toileting: Needs assistance Is this a change from baseline?: Pre-admission baseline In/Out Bed: Needs assistance Is this a change from baseline?: Pre-admission baseline Walks in Home: Needs assistance Is this a change from baseline?: Pre-admission baseline Does the patient have difficulty walking or climbing stairs?: Yes Weakness of Legs: Both Weakness of Arms/Hands: Both  Permission Sought/Granted Permission sought to share information with : Family Supports Permission granted to share information with : Yes, Release of Information Signed  Share Information with NAME: White  Permission granted to share info w AGENCY: [facility]  Permission granted to share info w Relationship: son     Emotional Assessment Appearance:: Appears stated age Attitude/Demeanor/Rapport: Unable to Assess Affect (typically observed): Unable to Assess Orientation: : Oriented to Self, Oriented to Place, Oriented to  Time Alcohol / Substance Use: Not Applicable Psych Involvement: No (comment)  Admission diagnosis:  Elevated troponin [R77.8] Hypertensive urgency [I16.0] Closed wedge compression fracture of T11 vertebra, initial encounter (HCC) [S22.080A] Hypertension, unspecified type [I10] Compression fracture of body of thoracic vertebra (HCC) [S22.000A] Patient Active Problem  List   Diagnosis Date Noted   Hypothyroidism 05/07/2021   Type 2 diabetes mellitus without complications (HCC) 05/07/2021    Compression fracture of body of thoracic vertebra (HCC) 05/07/2021   Hypertensive urgency 05/06/2021   Compression fracture of T11 vertebra (HCC) 05/06/2021   History of stroke 12/06/2019   Atrial fibrillation, controlled (HCC) 12/06/2019   Atrial fibrillation (HCC)    Hypertension    Elevated troponin 11/26/2019   Closed Colles' fracture of left radius 10/03/2017   Labile blood pressure    Benign essential HTN    Hypertensive crisis    Hyponatremia    Prediabetes    Hypoalbuminemia due to protein-calorie malnutrition (HCC)    Acute blood loss anemia    Stroke (cerebrum) (HCC) 08/01/2017   Acute CVA (cerebrovascular accident) (HCC) 07/29/2017   Syncope, near 03/31/2016   Gastro-esophageal reflux disease without esophagitis 02/15/2014   PCP:  Pcp, No Pharmacy:   [facility] - [place], [place] - [address] [place] [address] Phone: [phone] Fax: [phone]     Social Determinants of Health (SDOH) Interventions    Readmission Risk Interventions Readmission Risk Prevention Plan 08/23/2018  Post Dischage Appt Complete  Medication Screening Complete  Transportation Screening Complete  PCP follow-up Complete  Some recent data might be hidden

## 2021-05-08 NOTE — Progress Notes (Signed)
PROGRESS NOTE    Debbie Hampton  ONG:[id number] DOB: 1932-03-02 DOA: 05/06/2021 PCP: Oneita Hurt, No  Outpatient Specialists: cardiology    Brief Narrative:   Debbie Hampton is a 87 y.o. female with medical history significant for atrial fibrillation on chronic anticoagulation therapy, GERD, hypertension who was brought into the ER by EMS after she slid out of her wheelchair and landed on the floor.  She denies hitting her head and denies any loss of consciousness.  Blood pressure was significantly elevated when she arrived to ER. She is able to move her extremities and denies having any urinary or fecal incontinence or saddle anesthesia During my evaluation she appears very uncomfortable and restless but denies having any chest pain, no shortness of breath, no nausea, no vomiting, no abdominal pain, no changes in her bowel habits, no fever, no chills, no cough, no dizziness, no lightheadedness, no headache, no blurred vision no focal deficit.   Assessment & Plan:   Principal Problem:   Compression fracture of T11 vertebra (HCC) Active Problems:   Hyponatremia   Elevated troponin   Atrial fibrillation (HCC)   Hypertensive urgency   Compression fracture of body of thoracic vertebra (HCC)  # Acute T11 compression fracture No sig retropulsion on x ray. Neurosurg has seen, advises tlso brace, pain control, and outpt neurosurg f/u. Pain is controlled. Vit d wnl - oxy prn, miralax/senna standing - pt/ot advising snf, has bed acceptance, pending insurance authorization - consider osteoporosis tx as outpt  # Hypertensive urgency # HTN Urgency resolved. Hyponatremia as below - holding home lasix as may be causing hyponatremia. Cleda Daub also on med list but pt reports not taking. - increase atenolol to 100, and lisinopril to 20 - could resume hydralazine as needed (prescribed in past, pt reports not taking)  # Hyponatremia, chronic Chronic problem dating back multiple years. Stable and mild. Home  SSRI, lasix, and spironolactone likely contributory/causative. Sodium upper 20s here, 126 today. CT chest w/o concerning findings. - have reduced dose of citalopram and will d/c furosemide today - will check urine sodium, urine osm, and serum osm, as well as am cortisol  # A-fib Rate controlled - cont home apixaban, atenolol  # Hx cva Stable - cont home statin, apixaban  # Hypothyroid Does not appear to be on thyroid med at home. Tsh wnl   DVT prophylaxis: apixaban Code Status: dnr Family Communication: son updated telephonically 8/23  Level of care: Med-Surg Status is: Observation  The patient will require care spanning > 2 midnights and should be moved to inpatient because: Inpatient level of care appropriate due to severity of illness  Dispo: The patient is from: alf              Anticipated d/c is to: tbd              Patient currently is not medically stable to d/c.   Difficult to place patient No   Consultants:  neurosurgery  Procedures: none  Antimicrobials:  none    Subjective: This morning sitting in bed eating breakfast, no pain. No weakness/numbnes  Objective: Vitals:   05/08/21 0349 05/08/21 0410 05/08/21 0759 05/08/21 1134  BP: (!) 175/104 (!) 151/71 (!) 164/92 (!) 156/96  Pulse: 81 73 84 73  Resp: 18  18 19   Temp: (!) 97.5 F (36.4 C)  98.2 F (36.8 C) 98.2 F (36.8 C)  TempSrc:    Oral  SpO2: 97%  95% 99%  Weight:      Height:  Intake/Output Summary (Last 24 hours) at 05/08/2021 1417 Last data filed at 05/08/2021 1330 Gross per 24 hour  Intake 360 ml  Output 200 ml  Net 160 ml   Filed Weights   05/06/21 0045  Weight: 62.1 kg    Examination:  General exam: Appears calm and comfortable  Respiratory system: Clear to auscultation. Respiratory effort normal. Cardiovascular system: S1 & S2 heard, irreg irreg Gastrointestinal system: Abdomen is nondistended, soft and nontender. No organomegaly or masses felt. Normal bowel sounds  heard. Central nervous system: Alert and oriented. No focal neurological deficits. Extremities: Symmetric 5 x 5 power. Skin: No rashes, lesions or ulcers. No edema Psychiatry: demented, calm    Data Reviewed: I have personally reviewed following labs and imaging studies  CBC: Recent Labs  Lab 05/06/21 0302 05/07/21 0538  WBC 9.3 6.7  NEUTROABS 6.5  --   HGB 12.2 10.2*  HCT 36.7 30.3*  MCV 86.4 88.6  PLT 256 218   Basic Metabolic Panel: Recent Labs  Lab 05/06/21 0302 05/07/21 0538 05/07/21 2139 05/08/21 0356  NA 132* 128* 124* 126*  K 4.1 4.0 4.4 4.8  CL 100 94* 90* 94*  CO2 29 26 26 24   GLUCOSE 114* 117* 157* 112*  BUN 18 19 23 23   CREATININE 0.93 0.84 1.10* 1.06*  CALCIUM 10.0 9.2 9.4 9.5   GFR: Estimated Creatinine Clearance: 33 mL/min (A) (by C-G formula based on SCr of 1.06 mg/dL (H)). Liver Function Tests: Recent Labs  Lab 05/07/21 2139  AST 25  ALT 14  ALKPHOS 111  BILITOT 0.8  PROT 6.1*  ALBUMIN 3.5   No results for input(s): LIPASE, AMYLASE in the last 168 hours. No results for input(s): AMMONIA in the last 168 hours. Coagulation Profile: No results for input(s): INR, PROTIME in the last 168 hours. Cardiac Enzymes: Recent Labs  Lab 05/06/21 0302  CKTOTAL 65   BNP (last 3 results) No results for input(s): PROBNP in the last 8760 hours. HbA1C: No results for input(s): HGBA1C in the last 72 hours. CBG: Recent Labs  Lab 05/08/21 0803 05/08/21 1136  GLUCAP 104* 119*   Lipid Profile: No results for input(s): CHOL, HDL, LDLCALC, TRIG, CHOLHDL, LDLDIRECT in the last 72 hours. Thyroid Function Tests: Recent Labs    05/07/21 1051  TSH 0.506   Anemia Panel: No results for input(s): VITAMINB12, FOLATE, FERRITIN, TIBC, IRON, RETICCTPCT in the last 72 hours. Urine analysis:    Component Value Date/Time   COLORURINE YELLOW 05/06/2021 0315   APPEARANCEUR CLEAR 05/06/2021 0315   APPEARANCEUR Clear 11/20/2017 0854   LABSPEC 1.015  05/06/2021 0315   PHURINE 7.0 05/06/2021 0315   GLUCOSEU NEGATIVE 05/06/2021 0315   HGBUR TRACE (A) 05/06/2021 0315   BILIRUBINUR NEGATIVE 05/06/2021 0315   BILIRUBINUR Negative 11/20/2017 0854   KETONESUR NEGATIVE 05/06/2021 0315   PROTEINUR NEGATIVE 05/06/2021 0315   NITRITE NEGATIVE 05/06/2021 0315   LEUKOCYTESUR TRACE (A) 05/06/2021 0315   Sepsis Labs: @LABRCNTIP (procalcitonin:4,lacticidven:4)  ) Recent Results (from the past 240 hour(s))  Resp Panel by RT-PCR (Flu A&B, Covid) Nasopharyngeal Swab     Status: None   Collection Time: 05/06/21  6:25 AM   Specimen: Nasopharyngeal Swab; Nasopharyngeal(NP) swabs in vial transport medium  Result Value Ref Range Status   SARS Coronavirus 2 by RT PCR NEGATIVE NEGATIVE Final    Comment: (NOTE) SARS-CoV-2 target nucleic acids are NOT DETECTED.  The SARS-CoV-2 RNA is generally detectable in upper respiratory specimens during the acute phase of infection. The lowest  concentration of SARS-CoV-2 viral copies this assay can detect is 138 copies/mL. A negative result does not preclude SARS-Cov-2 infection and should not be used as the sole basis for treatment or other patient management decisions. A negative result may occur with  improper specimen collection/handling, submission of specimen other than nasopharyngeal swab, presence of viral mutation(s) within the areas targeted by this assay, and inadequate number of viral copies(<138 copies/mL). A negative result must be combined with clinical observations, patient history, and epidemiological information. The expected result is Negative.  Fact Sheet for Patients:  BloggerCourse.com  Fact Sheet for Healthcare Providers:  SeriousBroker.it  This test is no t yet approved or cleared by the [place] FDA and  has been authorized for detection and/or diagnosis of SARS-CoV-2 by FDA under an Emergency Use Authorization (EUA). This EUA  will remain  in effect (meaning this test can be used) for the duration of the COVID-19 declaration under Section 564(b)(1) of the Act, 21 U.S.C.section 360bbb-3(b)(1), unless the authorization is terminated  or revoked sooner.       Influenza A by PCR NEGATIVE NEGATIVE Final   Influenza B by PCR NEGATIVE NEGATIVE Final    Comment: (NOTE) The Xpert Xpress SARS-CoV-2/FLU/RSV plus assay is intended as an aid in the diagnosis of influenza from Nasopharyngeal swab specimens and should not be used as a sole basis for treatment. Nasal washings and aspirates are unacceptable for Xpert Xpress SARS-CoV-2/FLU/RSV testing.  Fact Sheet for Patients: BloggerCourse.com  Fact Sheet for Healthcare Providers: SeriousBroker.it  This test is not yet approved or cleared by the [place] FDA and has been authorized for detection and/or diagnosis of SARS-CoV-2 by FDA under an Emergency Use Authorization (EUA). This EUA will remain in effect (meaning this test can be used) for the duration of the COVID-19 declaration under Section 564(b)(1) of the Act, 21 U.S.C. section 360bbb-3(b)(1), unless the authorization is terminated or revoked.  Performed at [facility], [address], [place], [place] [address]          Radiology Studies: CT HEAD WO CONTRAST ( )  Result Date: 05/07/2021 CLINICAL DATA:  Altered level of consciousness EXAM: CT HEAD WITHOUT CONTRAST TECHNIQUE: Contiguous axial images were obtained from the base of the skull through the vertex without intravenous contrast. COMPARISON:  07/16/2020 FINDINGS: Brain: Evaluation is slightly limited due to patient motion during the exam. Chronic hypodensities throughout the periventricular and subcortical white matter consistent with chronic small vessel ischemic change. No acute infarct or hemorrhage. Lateral ventricles and remaining midline structures are unremarkable. No acute  extra-axial fluid collections. No mass effect. Vascular: Stable atherosclerosis.  No hyperdense vessel. Skull: Normal. Negative for fracture or focal lesion. Sinuses/Orbits: Chronic inspissated secretions within the left maxillary sinus. Remaining paranasal sinuses are clear. Marked temporomandibular joint osteoarthritis, right greater than left, which has progressed since prior study. Other: None. IMPRESSION: 1. Chronic small vessel ischemic changes. No acute intracranial process. 2. No acute hemorrhage. Electronically Signed   By: Sharlet Salina M.D.   On: 05/07/2021 21:22   CT CHEST WO CONTRAST  Result Date: 05/07/2021 CLINICAL DATA:  History pulmonary nodule.  Hyponatremia. EXAM: CT CHEST WITHOUT CONTRAST TECHNIQUE: Multidetector CT imaging of the chest was performed following the standard protocol without IV contrast. COMPARISON:  08/22/2018 FINDINGS: Cardiovascular: Moderate cardiac enlargement. Aortic atherosclerosis and 3 vessel coronary artery calcifications. Mediastinum/Nodes: Bilateral thyroid nodules. The largest is in the posterior left lobe measuring 1.5 cm, image 8/2. The trachea appears patent and is midline. The esophagus  is dilated with air fluid level. Large hiatal hernia is identified which appears distended with fluid. No enlarged lymph nodes. Lungs/Pleura: Small bilateral pleural effusions, right greater than left. Platelike atelectasis noted within the lung bases. No significant interstitial edema. No airspace consolidation. There is a linear branching soft tissue density within the anterior right upper lobe, this is increased in size from previous exam and likely represents chronic mucous plugging in a dilated bronchial, image 30/3. This corresponds to the previously described cluster of nodular densities within the right upper lobe seen on 08/22/2018. Upper Abdomen: No acute abnormality. Musculoskeletal: Advanced changes of osteoarthritis noted involving both glenohumeral joints.  Multilevel thoracic degenerative disc disease. Compression deformity within the lower thoracic spine is identified as noted on 05/06/2021. Here, there is loss of approximately 50% of the vertebral body height. IMPRESSION: 1. No acute cardiopulmonary abnormalities. 2. Progressive, linear branching soft tissue density within the right upper lobe corresponding to previously characterize cluster of nodular densities. This likely represents benign chronic mucous plugging within dilated distal bronchials. 3. Small bilateral pleural effusions, right greater than left. 4. Aortic atherosclerosis and 3 vessel coronary artery calcifications. 5. Large hiatal hernia. 6. Bilateral thyroid nodules. These do not require immediate attention and have been noted on previous imaging. If not already performed, further evaluation with nonemergent thyroid US is advised. (Ref: J Am Coll Radiol. 2015 Feb;12(2): 143-50). Aortic Atherosclerosis (ICD10-I70.0). Electronically Signed   By: Signa Kell M.D.   On: 05/07/2021 14:38        Scheduled Meds:  apixaban  2.5 mg Oral Q12H   atenolol  50 mg Oral Daily   cholecalciferol  2,000 Units Oral Daily   [START ON 05/09/2021] citalopram  10 mg Oral Daily   furosemide  20 mg Oral Daily   Gerhardt's butt cream   Topical TID   iron polysaccharides  150 mg Oral Daily   lidocaine  1 patch Transdermal Q24H   lisinopril  10 mg Oral Daily   omega-3 acid ethyl esters  1 g Oral BID   pantoprazole  40 mg Oral Daily   polyethylene glycol  17 g Oral Daily   rosuvastatin  20 mg Oral QHS   tiZANidine  2 mg Oral BID   Continuous Infusions:  sodium chloride 75 mL/hr at 05/07/21 2254     LOS: 1 day    Time spent: 30  min    Silvano Bilis, MD [facility]   If 7PM-7AM, please contact night-coverage [email or web address] Password [email or web address] 05/08/2021, 2:17 PM   ]

## 2021-05-09 DIAGNOSIS — I4891 Unspecified atrial fibrillation: Secondary | ICD-10-CM | POA: Diagnosis not present

## 2021-05-09 DIAGNOSIS — S22080K Wedge compression fracture of T11-T12 vertebra, subsequent encounter for fracture with nonunion: Secondary | ICD-10-CM | POA: Diagnosis not present

## 2021-05-09 DIAGNOSIS — I10 Essential (primary) hypertension: Secondary | ICD-10-CM | POA: Diagnosis not present

## 2021-05-09 DIAGNOSIS — S22000A Wedge compression fracture of unspecified thoracic vertebra, initial encounter for closed fracture: Secondary | ICD-10-CM | POA: Diagnosis not present

## 2021-05-09 LAB — BASIC METABOLIC PANEL
Anion gap: 5 (ref 5–15)
BUN: 18 mg/dL (ref 8–23)
CO2: 26 mmol/L (ref 22–32)
Calcium: 9.2 mg/dL (ref 8.9–10.3)
Chloride: 101 mmol/L (ref 98–111)
Creatinine, Ser: 0.75 mg/dL (ref 0.44–1.00)
GFR, Estimated: >60 mL/min (ref >60)
Glucose, Bld: 91 mg/dL (ref 70–99)
Potassium: 4.3 mmol/L (ref 3.5–5.1)
Sodium: 132 mmol/L — ABNORMAL LOW (ref 135–145)

## 2021-05-09 LAB — CORTISOL-AM, BLOOD: Cortisol - AM: 6 ug/dL — ABNORMAL LOW (ref 6.7–22.6)

## 2021-05-09 MED ORDER — FUROSEMIDE 20 MG PO TABS: 20.0000 mg | ORAL_TABLET | Freq: Every day | ORAL | 0 refills | Status: DC | PRN

## 2021-05-09 MED ORDER — HYDROCODONE-ACETAMINOPHEN 5-325 MG PO TABS: 2.0000 | ORAL_TABLET | Freq: Four times a day (QID) | ORAL | 0 refills | Status: DC | PRN

## 2021-05-09 MED ORDER — LIDOCAINE 5 % EX PTCH: 1.0000 | MEDICATED_PATCH | CUTANEOUS | 0 refills | Status: DC

## 2021-05-09 MED ORDER — ATENOLOL 50 MG PO TABS: 50.0000 mg | ORAL_TABLET | Freq: Two times a day (BID) | ORAL | 0 refills | Status: DC

## 2021-05-09 NOTE — Plan of Care (Signed)

## 2021-05-09 NOTE — TOC Transition Note (Signed)
Transition of Care [facility]) - CM/SW Discharge Note   Patient Details  Name: Debbie Hampton MRN: [id number] Date of Birth: Apr 10, 1932  Transition of Care [facility]) CM/SW Contact:  Maree Krabbe, LCSW Phone Number: 05/09/2021, 1:10 PM   Clinical Narrative:  Clinical Social Worker facilitated patient discharge including contacting patient family and facility to confirm patient discharge plans.  Clinical information faxed to facility and family agreeable with plan.  CSW arranged ambulance transport via [facility] to [facility].  RN to call [phone] for report prior to discharge.    Final next level of care: Skilled Nursing Facility Barriers to Discharge: No Barriers Identified   Patient Goals and CMS Choice Patient states their goals for this hospitalization and ongoing recovery are:: for pt to go to snf   Choice offered to / list presented to : Adult Children  Discharge Placement              Patient chooses bed at:  [facility]) Patient to be transferred to facility by: [facility]   Patient and family notified of of transfer: 05/09/21  Discharge Plan and Services In-house Referral: NA   Post Acute Care Choice: Skilled Nursing Facility                               Social Determinants of Health (SDOH) Interventions     Readmission Risk Interventions Readmission Risk Prevention Plan 08/23/2018  Post Dischage Appt Complete  Medication Screening Complete  Transportation Screening Complete  PCP follow-up Complete  Some recent data might be hidden

## 2021-05-09 NOTE — Discharge Summary (Signed)
[facility] [place] [facility]   PATIENT NAME: Debbie Hampton    MR#:  [id number]  DATE OF BIRTH:  1931-11-12  DATE OF ADMISSION:  05/06/2021 ADMITTING PHYSICIAN: Kathrynn Running, MD  DATE OF DISCHARGE: 05/09/2021  PRIMARY CARE PHYSICIAN: Kandyce Rud, MD    ADMISSION DIAGNOSIS:  Elevated troponin [R77.8] Hypertensive urgency [I16.0] Closed wedge compression fracture of T11 vertebra, initial encounter (HCC) [S22.080A] Hypertension, unspecified type [I10] Compression fracture of body of thoracic vertebra (HCC) [S22.000A]  DISCHARGE DIAGNOSIS:  acute T11 compression fracture-- conservative management SECONDARY DIAGNOSIS:   Past Medical History:  Diagnosis Date  . Atrial fibrillation (HCC)   . Bilateral shoulder pain   . Colon polyp   . Constipation   . GERD (gastroesophageal reflux disease)   . Hypertension   . Impaired fasting glucose   . Syncope 03/2016   with [facility]  . Varicose veins of both lower extremities     HOSPITAL COURSE:  Debbie Hampton is a 87 y.o. female with medical history significant for atrial fibrillation on chronic anticoagulation therapy, GERD, hypertension who was brought into the ER by EMS after she slid out of her wheelchair and landed on the floor.  She denies hitting her head and denies any loss of consciousness.  Blood pressure was significantly elevated when she arrived to ER.  # Acute T11 compression fracture s/p mechanical fall (slid down from her WC) --No sig retropulsion on x ray. Neurosurg has seen, advises tlso brace, pain control, and outpt neurosurg f/u.  --Pain is controlled. Vit d wnl - pt/ot advising snf, has bed acceptance - consider osteoporosis tx as outpt--defer to PCP   # Hypertensive urgency # HTN Urgency resolved. Hyponatremia as below - holding home lasix as may be causing hyponatremia. Cleda Daub also on med list but pt reports not taking. - increase atenolol to 50 mg bid, and lisinopril  - BP much  stable   # Hyponatremia, chronic Chronic problem dating back multiple years. Stable and mild. Home SSRI, lasix,  likely contributory/causative. -Na 132 -- CT chest w/o concerning findings. - lasix prn  for nw   # A-fib Rate controlled - cont home apixaban, atenolol   # Hx cva Stable - cont home statin, apixaban   # Hypothyroid Does not appear to be on thyroid med at home. Tsh wnl     DVT prophylaxis: apixaban Code Status: dnr Family Communication: son updated telephonically 8/24   Level of care: Med-Surg Status is: inpatient     Dispo: The patient is from: alf              Anticipated d/c is to: SNF today              Patient currently is medically optimized for discharge              Difficult to place patient No      CONSULTS OBTAINED:    DRUG ALLERGIES:   Allergies  Allergen Reactions  . Cheese     GOAT cheese  . Mushroom Extract Complex   . Penicillins     Has patient had a PCN reaction causing immediate rash, facial/tongue/throat swelling, SOB or lightheadedness with hypotension: Yes Has patient had a PCN reaction causing severe rash involving mucus membranes or skin necrosis: No Has patient had a PCN reaction that required hospitalization No Has patient had a PCN reaction occurring within the last 10 years: Yes If all of the above answers are "NO", then may  proceed with Cephalosporin use.   . Verapamil   . Aloe Hives and Rash  . Amlodipine Hives, Rash and Hypertension  . Cephalexin Rash  . Metoprolol Hives, Palpitations and Hypertension  . Miconazole Swelling and Rash  . Neosporin [Neomycin-Bacitracin Zn-Polymyx] Rash  . Trandolapril-Verapamil Hcl Er Rash    DISCHARGE MEDICATIONS:   Allergies as of 05/09/2021       Reactions   Cheese    GOAT cheese   Mushroom Extract Complex    Penicillins    Has patient had a PCN reaction causing immediate rash, facial/tongue/throat swelling, SOB or lightheadedness with hypotension: Yes Has patient had a  PCN reaction causing severe rash involving mucus membranes or skin necrosis: No Has patient had a PCN reaction that required hospitalization No Has patient had a PCN reaction occurring within the last 10 years: Yes If all of the above answers are "NO", then may proceed with Cephalosporin use.   Verapamil    Aloe Hives, Rash   Amlodipine Hives, Rash, Hypertension   Cephalexin Rash   Metoprolol Hives, Palpitations, Hypertension   Miconazole Swelling, Rash   Neosporin [neomycin-bacitracin Zn-polymyx] Rash   Trandolapril-verapamil Hcl Er Rash        Medication List     STOP taking these medications    hydrALAZINE 50 MG tablet Commonly known as: APRESOLINE   potassium chloride 10 MEQ tablet Commonly known as: KLOR-CON   spironolactone 25 MG tablet Commonly known as: ALDACTONE       TAKE these medications    acetaminophen 500 MG tablet Commonly known as: TYLENOL Take 500 mg by mouth every 8 (eight) hours as needed.   apixaban 2.5 MG Tabs tablet Commonly known as: ELIQUIS Take 2.5 mg by mouth every 12 (twelve) hours.   atenolol 50 MG tablet Commonly known as: TENORMIN Take 1 tablet (50 mg total) by mouth 2 (two) times daily. What changed: when to take this   Aveeno Daily Moisturizer Lotn Apply topically 2 (two) times daily as needed (for dry skin).   CertaVite/Antioxidants Tabs Take 1 tablet by mouth daily.   citalopram 20 MG tablet Commonly known as: CELEXA Take 20 mg by mouth daily.   clobetasol cream 0.05 % Commonly known as: TEMOVATE Apply 1 application topically 2 (two) times daily as needed (rash or itching). (apply to feet, ankles, arms, back, buttocks and perineal area)   Dermacloud Oint Apply topically 3 (three) times daily. Apply to buttocks three times daily   diclofenac Sodium 1 % Gel Commonly known as: VOLTAREN Apply 2 g topically 4 (four) times daily.   famotidine 40 MG tablet Commonly known as: PEPCID Take 40 mg by mouth at bedtime.    Fluocinolone Acetonide 0.01 % Oil Place 2 drops into both ears once a week.   furosemide 20 MG tablet Commonly known as: LASIX Take 1 tablet (20 mg total) by mouth daily as needed. For SOB or leg edema What changed:  when to take this reasons to take this additional instructions   HYDROcodone-acetaminophen 5-325 MG tablet Commonly known as: NORCO/VICODIN Take 2 tablets by mouth every 6 (six) hours as needed.   iron polysaccharides 150 MG capsule Commonly known as: NIFEREX Take 150 mg by mouth daily.   lidocaine 5 % Commonly known as: LIDODERM Place 1 patch onto the skin daily. Remove & Discard patch within 12 hours or as directed by MD Start taking on: May 10, 2021   lisinopril 10 MG tablet Commonly known as: ZESTRIL Take 10 mg by  mouth daily. What changed: Another medication with the same name was removed. Continue taking this medication, and follow the directions you see here.   mometasone 0.1 % lotion Commonly known as: ELOCON Apply 4 drops topically at bedtime as needed for itching or dry skin. Place into each ear.   omega-3 acid ethyl esters 1 g capsule Commonly known as: LOVAZA Take 1 g by mouth 2 (two) times daily.   pantoprazole 40 MG tablet Commonly known as: PROTONIX Take 1 tablet (40 mg total) by mouth daily.   Polyethyl Glycol-Propyl Glycol 0.4-0.3 % Soln Place 1 drop into both eyes 3 (three) times daily as needed.   polyethylene glycol 17 g packet Commonly known as: MIRALAX / GLYCOLAX Take 17 g by mouth daily.   rosuvastatin 20 MG tablet Commonly known as: CRESTOR Take 20 mg by mouth at bedtime.   sodium chloride 0.65 % Soln nasal spray Commonly known as: OCEAN Place 1 spray into both nostrils 3 (three) times daily as needed for congestion.   tiZANidine 2 MG tablet Commonly known as: ZANAFLEX Take 2 mg by mouth in the morning and at bedtime.   Vitamin D 50 MCG (2000 UT) tablet Take 2,000 Units by mouth daily.   Xlear Sinus Care Spray  Soln Place 1 spray into both nostrils 3 (three) times daily.        If you experience worsening of your admission symptoms, develop shortness of breath, life threatening emergency, suicidal or homicidal thoughts you must seek medical attention immediately by calling 911 or calling your MD immediately  if symptoms less severe.  You Must read complete instructions/literature along with all the possible adverse reactions/side effects for all the Medicines you take and that have been prescribed to you. Take any new Medicines after you have completely understood and accept all the possible adverse reactions/side effects.   Please note  You were cared for by a hospitalist during your hospital stay. If you have any questions about your discharge medications or the care you received while you were in the hospital after you are discharged, you can call the unit and asked to speak with the hospitalist on call if the hospitalist that took care of you is not available. Once you are discharged, your primary care physician will handle any further medical issues. Please note that NO REFILLS for any discharge medications will be authorized once you are discharged, as it is imperative that you return to your primary care physician (or establish a relationship with a primary care physician if you do not have one) for your aftercare needs so that they can reassess your need for medications and monitor your lab values. Today   SUBJECTIVE   hard of hearing  VITAL SIGNS:  Blood pressure 135/74, pulse 83, temperature 97.9 F (36.6 C), resp. rate 14, height 5\' 2"  (1.575 m), weight 62.1 kg, SpO2 97 %.  I/O:   Intake/Output Summary (Last 24 hours) at 05/09/2021 1242 Last data filed at 05/09/2021 0855 Gross per 24 hour  Intake 1217.87 ml  Output 300 ml  Net 917.87 ml    PHYSICAL EXAMINATION:  GENERAL:  87 y.o.-year-old patient lying in the bed with no acute distress.  LUNGS: Normal breath sounds bilaterally,  no wheezing, rales,rhonchi or crepitation. No use of accessory muscles of respiration.  CARDIOVASCULAR: S1, S2 normal. No murmurs, rubs, or gallops.  ABDOMEN: Soft, non-tender, non-distended. Bowel sounds present. No organomegaly or mass.  EXTREMITIES: No pedal edema, cyanosis, or clubbing.  NEUROLOGIC: weak, non  focal PSYCHIATRIC:patient is alert and awake SKIN: No obvious rash, lesion, or ulcer.   DATA REVIEW:   CBC  Recent Labs  Lab 05/07/21 0538  WBC 6.7  HGB 10.2*  HCT 30.3*  PLT 218    Chemistries  Recent Labs  Lab 05/07/21 2139 05/08/21 0356 05/09/21 0519  NA 124*   < > 132*  K 4.4   < > 4.3  CL 90*   < > 101  CO2 26   < > 26  GLUCOSE 157*   < > 91  BUN 23   < > 18  CREATININE 1.10*   < > 0.75  CALCIUM 9.4   < > 9.2  AST 25  --   --   ALT 14  --   --   ALKPHOS 111  --   --   BILITOT 0.8  --   --    < > = values in this interval not displayed.    Microbiology Results   Recent Results (from the past 240 hour(s))  Resp Panel by RT-PCR (Flu A&B, Covid) Nasopharyngeal Swab     Status: None   Collection Time: 05/06/21  6:25 AM   Specimen: Nasopharyngeal Swab; Nasopharyngeal(NP) swabs in vial transport medium  Result Value Ref Range Status   SARS Coronavirus 2 by RT PCR NEGATIVE NEGATIVE Final    Comment: (NOTE) SARS-CoV-2 target nucleic acids are NOT DETECTED.  The SARS-CoV-2 RNA is generally detectable in upper respiratory specimens during the acute phase of infection. The lowest concentration of SARS-CoV-2 viral copies this assay can detect is 138 copies/mL. A negative result does not preclude SARS-Cov-2 infection and should not be used as the sole basis for treatment or other patient management decisions. A negative result may occur with  improper specimen collection/handling, submission of specimen other than nasopharyngeal swab, presence of viral mutation(s) within the areas targeted by this assay, and inadequate number of viral copies(<138  copies/mL). A negative result must be combined with clinical observations, patient history, and epidemiological information. The expected result is Negative.  Fact Sheet for Patients:  BloggerCourse.comhttps://www.fda.gov/media/152166/download  Fact Sheet for Healthcare Providers:  SeriousBroker.ithttps://www.fda.gov/media/152162/download  This test is no t yet approved or cleared by the [place] FDA and  has been authorized for detection and/or diagnosis of SARS-CoV-2 by FDA under an Emergency Use Authorization (EUA). This EUA will remain  in effect (meaning this test can be used) for the duration of the COVID-19 declaration under Section 564(b)(1) of the Act, 21 U.S.C.section 360bbb-3(b)(1), unless the authorization is terminated  or revoked sooner.       Influenza A by PCR NEGATIVE NEGATIVE Final   Influenza B by PCR NEGATIVE NEGATIVE Final    Comment: (NOTE) The Xpert Xpress SARS-CoV-2/FLU/RSV plus assay is intended as an aid in the diagnosis of influenza from Nasopharyngeal swab specimens and should not be used as a sole basis for treatment. Nasal washings and aspirates are unacceptable for Xpert Xpress SARS-CoV-2/FLU/RSV testing.  Fact Sheet for Patients: BloggerCourse.comhttps://www.fda.gov/media/152166/download  Fact Sheet for Healthcare Providers: SeriousBroker.ithttps://www.fda.gov/media/152162/download  This test is not yet approved or cleared by the [place] FDA and has been authorized for detection and/or diagnosis of SARS-CoV-2 by FDA under an Emergency Use Authorization (EUA). This EUA will remain in effect (meaning this test can be used) for the duration of the COVID-19 declaration under Section 564(b)(1) of the Act, 21 U.S.C. section 360bbb-3(b)(1), unless the authorization is terminated or revoked.  Performed at [facility], [address]  [place], [place] [address]     RADIOLOGY:  CT HEAD WO CONTRAST ( )  Result Date: 05/07/2021 CLINICAL DATA:  Altered level of consciousness EXAM: CT HEAD  WITHOUT CONTRAST TECHNIQUE: Contiguous axial images were obtained from the base of the skull through the vertex without intravenous contrast. COMPARISON:  07/16/2020 FINDINGS: Brain: Evaluation is slightly limited due to patient motion during the exam. Chronic hypodensities throughout the periventricular and subcortical white matter consistent with chronic small vessel ischemic change. No acute infarct or hemorrhage. Lateral ventricles and remaining midline structures are unremarkable. No acute extra-axial fluid collections. No mass effect. Vascular: Stable atherosclerosis.  No hyperdense vessel. Skull: Normal. Negative for fracture or focal lesion. Sinuses/Orbits: Chronic inspissated secretions within the left maxillary sinus. Remaining paranasal sinuses are clear. Marked temporomandibular joint osteoarthritis, right greater than left, which has progressed since prior study. Other: None. IMPRESSION: 1. Chronic small vessel ischemic changes. No acute intracranial process. 2. No acute hemorrhage. Electronically Signed   By: Sharlet Salina M.D.   On: 05/07/2021 21:22   CT CHEST WO CONTRAST  Result Date: 05/07/2021 CLINICAL DATA:  History pulmonary nodule.  Hyponatremia. EXAM: CT CHEST WITHOUT CONTRAST TECHNIQUE: Multidetector CT imaging of the chest was performed following the standard protocol without IV contrast. COMPARISON:  08/22/2018 FINDINGS: Cardiovascular: Moderate cardiac enlargement. Aortic atherosclerosis and 3 vessel coronary artery calcifications. Mediastinum/Nodes: Bilateral thyroid nodules. The largest is in the posterior left lobe measuring 1.5 cm, image 8/2. The trachea appears patent and is midline. The esophagus is dilated with air fluid level. Large hiatal hernia is identified which appears distended with fluid. No enlarged lymph nodes. Lungs/Pleura: Small bilateral pleural effusions, right greater than left. Platelike atelectasis noted within the lung bases. No significant interstitial edema.  No airspace consolidation. There is a linear branching soft tissue density within the anterior right upper lobe, this is increased in size from previous exam and likely represents chronic mucous plugging in a dilated bronchial, image 30/3. This corresponds to the previously described cluster of nodular densities within the right upper lobe seen on 08/22/2018. Upper Abdomen: No acute abnormality. Musculoskeletal: Advanced changes of osteoarthritis noted involving both glenohumeral joints. Multilevel thoracic degenerative disc disease. Compression deformity within the lower thoracic spine is identified as noted on 05/06/2021. Here, there is loss of approximately 50% of the vertebral body height. IMPRESSION: 1. No acute cardiopulmonary abnormalities. 2. Progressive, linear branching soft tissue density within the right upper lobe corresponding to previously characterize cluster of nodular densities. This likely represents benign chronic mucous plugging within dilated distal bronchials. 3. Small bilateral pleural effusions, right greater than left. 4. Aortic atherosclerosis and 3 vessel coronary artery calcifications. 5. Large hiatal hernia. 6. Bilateral thyroid nodules. These do not require immediate attention and have been noted on previous imaging. If not already performed, further evaluation with nonemergent thyroid US is advised. (Ref: J Am Coll Radiol. 2015 Feb;12(2): 143-50). Aortic Atherosclerosis (ICD10-I70.0). Electronically Signed   By: Signa Kell M.D.   On: 05/07/2021 14:38     CODE STATUS:     Code Status Orders  (From admission, onward)           Start     Ordered   05/06/21 0850  Do not attempt resuscitation (DNR)  Continuous       Question Answer Comment  In the event of cardiac or respiratory ARREST Do not call a "code blue"   In the event of cardiac or respiratory ARREST Do not perform Intubation, CPR, defibrillation or ACLS  In the event of cardiac or respiratory ARREST Use  medication by any route, position, wound care, and other measures to relive pain and suffering. May use oxygen, suction and manual treatment of airway obstruction as needed for comfort.   Comments Discussed CODE STATUS with patient's son who was at the bedside, Debbie Hampton and she is a DNR.      05/06/21 0851           Code Status History     Date Active Date Inactive Code Status Order ID Comments User Context   11/26/2019 2020 11/28/2019 2329 DNR [id number]  Lindie Spruce, MD ED   08/22/2018 0548 08/23/2018 1712 DNR [id number]  Barbaraann Rondo, MD Inpatient   08/01/2017 1711 08/13/2017 2110 DNR [id number]  Jacquelynn Cree, PA-C Inpatient   08/01/2017 1710 08/01/2017 1711 DNR [id number]  Jacquelynn Cree, PA-C Inpatient   07/29/2017 1820 08/01/2017 1637 DNR [id number]  Shaune Pollack, MD Inpatient   03/31/2016 1523 04/03/2016 1750 Full Code [id number]  Hower, Cletis [place], MD ED      Advance Directive Documentation    Flowsheet Row Most Recent Value  Type of Advance Directive Healthcare Power of Attorney  Pre-existing out of facility DNR order (yellow form or pink MOST form) --  "MOST" Form in Place? --        TOTAL TIME TAKING CARE OF THIS PATIENT: 35 minutes.    Enedina Finner M.D  [facility]    CC: Primary care physician; Kandyce Rud, MD

## 2021-05-22 ENCOUNTER — Other Ambulatory Visit: Payer: Self-pay | Admitting: Neurosurgery

## 2021-05-22 ENCOUNTER — Other Ambulatory Visit (HOSPITAL_COMMUNITY): Payer: Self-pay | Admitting: Neurosurgery

## 2021-05-22 DIAGNOSIS — Z8781 Personal history of (healed) traumatic fracture: Secondary | ICD-10-CM

## 2021-05-29 ENCOUNTER — Other Ambulatory Visit: Payer: Self-pay | Admitting: Orthopedic Surgery

## 2021-05-29 ENCOUNTER — Ambulatory Visit
Admission: RE | Admit: 2021-05-29 | Discharge: 2021-05-29 | Disposition: A | Discharge status: Home / Self Care | Payer: Medicare Other | Source: Ambulatory Visit | Attending: Neurosurgery | Admitting: Neurosurgery

## 2021-05-29 ENCOUNTER — Other Ambulatory Visit: Payer: Self-pay

## 2021-05-29 DIAGNOSIS — Z8781 Personal history of (healed) traumatic fracture: Secondary | ICD-10-CM | POA: Insufficient documentation

## 2021-06-01 ENCOUNTER — Other Ambulatory Visit
Admission: RE | Admit: 2021-06-01 | Discharge: 2021-06-01 | Disposition: A | Discharge status: Home / Self Care | Payer: Medicare Other | Source: Ambulatory Visit | Attending: Orthopedic Surgery | Admitting: Orthopedic Surgery

## 2021-06-01 HISTORY — DX: Unspecified osteoarthritis, unspecified site: M19.90

## 2021-06-01 HISTORY — DX: Personal history of other diseases of the digestive system: Z87.19

## 2021-06-01 HISTORY — DX: Cerebral infarction, unspecified: I63.9

## 2021-06-01 HISTORY — DX: Unspecified atrial fibrillation: I48.91

## 2021-06-01 NOTE — Patient Instructions (Addendum)
Your procedure is scheduled on: 06/05/21 - Tuesday Report to the Registration Desk on the 1st floor of the [facility]. To find out your arrival time, please call [phone] between 1PM - 3PM on: 06/04/21 - Monday  REMEMBER: Instructions that are not followed completely may result in serious medical risk, up to and including death; or upon the discretion of your surgeon and anesthesiologist your surgery may need to be rescheduled.  Do not eat food after midnight the night before surgery.  No gum chewing, lozengers or hard candies.  You may however, drink CLEAR liquids up to 2 hours before you are scheduled to arrive for your surgery. Do not drink anything within 2 hours of your scheduled arrival time.  Clear liquids include: - water  - apple juice without pulp - gatorade (not RED, PURPLE, OR BLUE) - black coffee or tea (Do NOT add milk or creamers to the coffee or tea) Do NOT drink anything that is not on this list.   In addition, your doctor has ordered for you to drink the provided  Ensure Pre-Surgery Clear Carbohydrate Drink  Drinking this carbohydrate drink up to two hours before surgery helps to reduce insulin resistance and improve patient outcomes. Please complete drinking 2 hours prior to scheduled arrival time.  TAKE THESE MEDICATIONS THE MORNING OF SURGERY WITH A SIP OF WATER: - atenolol (TENORMIN) 50 MG tablet - citalopram (CELEXA) 20 MG tablet - Esomeprazole Magnesium 20 MG TBEC - tiZANidine (ZANAFLEX) 2 MG tablet  Follow recommendations from Cardiologist, Pulmonologist or PCP regarding stopping Aspirin, Coumadin, Plavix, Eliquis, Pradaxa, or Pletal.   Stop taking Eliquis beginning 06/02/21, may resume 1 day after surgery on 06/06/21.  One week prior to surgery: Stop Anti-inflammatories (NSAIDS) such as Advil, Aleve, Ibuprofen, Motrin, Naproxen, Naprosyn and Aspirin based products such as Excedrin, Goodys Powder, BC Powder.  Stop ANY OVER THE COUNTER supplements  until after surgery.  You may however, continue to take Tylenol if needed for pain up until the day of surgery.  No Alcohol for 24 hours before or after surgery.  No Smoking including e-cigarettes for 24 hours prior to surgery.  No chewable tobacco products for at least 6 hours prior to surgery.  No nicotine patches on the day of surgery.  Do not use any "recreational" drugs for at least a week prior to your surgery.  Please be advised that the combination of cocaine and anesthesia may have negative outcomes, up to and including death. If you test positive for cocaine, your surgery will be cancelled.  On the morning of surgery brush your teeth with toothpaste and water, you may rinse your mouth with mouthwash if you wish. Do not swallow any toothpaste or mouthwash.  Use CHG Soap or wipes as directed on instruction sheet.  Do not wear jewelry, make-up, hairpins, clips or nail polish.  Do not wear lotions, powders, or perfumes.   Do not shave body from the neck down 48 hours prior to surgery just in case you cut yourself which could leave a site for infection.  Also, freshly shaved skin may become irritated if using the CHG soap.  Contact lenses, hearing aids and dentures may not be worn into surgery.  Do not bring valuables to the hospital. [facility] is not responsible for any missing/lost belongings or valuables.   Notify your doctor if there is any change in your medical condition (cold, fever, infection).  Wear comfortable clothing (specific to your surgery type) to the hospital.  After  surgery, you can help prevent lung complications by doing breathing exercises.  Take deep breaths and cough every 1-2 hours. Your doctor may order a device called an Incentive Spirometer to help you take deep breaths. When coughing or sneezing, hold a pillow firmly against your incision with both hands. This is called "splinting." Doing this helps protect your incision. It also decreases belly  discomfort.  If you are being admitted to the hospital overnight, leave your suitcase in the car. After surgery it may be brought to your room.  If you are being discharged the day of surgery, you will not be allowed to drive home. You will need a responsible adult (18 years or older) to drive you home and stay with you that night.   If you are taking public transportation, you will need to have a responsible adult (18 years or older) with you. Please confirm with your physician that it is acceptable to use public transportation.   Please call the [facility] at [phone] if you have any questions about these instructions.  Surgery Visitation Policy:  Patients undergoing a surgery or procedure may have one family member or support person with them as long as that person is not COVID-19 positive or experiencing its symptoms.  That person may remain in the waiting area during the procedure.  Inpatient Visitation:    Visiting hours are 7 a.m. to 8 p.m. Inpatients will be allowed two visitors daily. The visitors may change each day during the patient's stay. No visitors under the age of 61. Any visitor under the age of 76 must be accompanied by an adult. The visitor must pass COVID-19 screenings, use hand sanitizer when entering and exiting the patient's room and wear a mask at all times, including in the patient's room. Patients must also wear a mask when staff or their visitor are in the room. Masking is required regardless of vaccination status.

## 2021-06-01 NOTE — Pre-Procedure Instructions (Signed)
Pre-admission phone call done with [facility]CP[facility]-  - Cedric Fishman (POA),[place] , instructions faxed to [facility] attention - [facility] and  copy sent to [facility] -secured. This Clinical research associate was unable to be connected to a nurse at the facility, per secretary " they are passing trays and passing medications". This Clinical research associate informed her that it was very important for the nurse to follow the pre op instructions, as they included orders for some meds to be stopped and only certain ones to be given the morning of surgery.

## 2021-06-04 ENCOUNTER — Encounter: Payer: Self-pay | Admitting: Orthopedic Surgery

## 2021-06-04 NOTE — Progress Notes (Signed)
Perioperative Services  Pre-Admission/Anesthesia Testing Clinical Review  Date: 06/04/21  Patient Demographics:  Name: Debbie Hampton DOB:   1931/09/26 MRN:   [id number]  Planned Surgical Procedure(s):    Case: [id number] Date/Time: 06/05/21 1423   Procedure: T 11 KYPHOPLASTY   Anesthesia type: Choice   Pre-op diagnosis: T11 Compression Fracture   Location: [facility] OR ROOM 01 / [facility] ORS FOR ANESTHESIA GROUP   Surgeons: Debbie Bucker, MD     NOTE: Available PAT nursing documentation and vital signs have been reviewed. Clinical nursing staff has updated patient's PMH/PSHx, current medication list, and drug allergies/intolerances to ensure comprehensive history available to assist in medical decision making as it pertains to the aforementioned surgical procedure and anticipated anesthetic course. Extensive review of available clinical information performed. Debbie Hampton PMH and PSHx updated with any diagnoses/procedures that  may have been inadvertently omitted during her intake with the pre-admission testing department's nursing staff.  Clinical Discussion:  Debbie Hampton is a 87 y.o. female who is submitted for pre-surgical anesthesia review and clearance prior to her undergoing the above procedure. Patient is a Former Smoker (quit 02/1984). Pertinent PMH includes: atrial fibrillation, coronary artery calcification, carotid artery disease, CVA, valvular regurgitation, aortic atherosclerosis, HTN, HLD, DOE, GERD (on daily PPI), large hiatal hernia, OA, falls, depression.  Patient is followed by cardiology Debbie Junker, MD). She was last seen in the cardiology clinic on 05/22/2021; notes reviewed.  At the time of her clinic visit, patient denied any episodes of chest pain.  She complained of chronic exertional dyspnea, which was reported to be at baseline.  Patient with mild chronic peripheral edema.  No PND, orthopnea, palpitations, vertiginous symptoms, or presyncope/syncope.  PMH significant for  cardiovascular diagnoses.  Patient suffered an acute CVA on 07/29/2017.  Imaging revealed a 1.5 cm infarction in the periventricular deep white matter on the RIGHT adjacent to the posterior body of the lateral ventricle.  BILATERAL carotid Doppler study performed on 07/30/2017 revealed mild to moderate amount of atherosclerotic plaque in the BILATERAL ICAs compatible with a 50-69% luminal narrowing (R >L).  TTE performed on 07/30/2017 revealed normal left ventricular systolic function with an EF of 60 to 65%.  Wall motion normal.  There was annular calcification of the mitral valve with mild regurgitation; no stenosis.  The left atrium was mildly to moderately dilated.  Right ventricular systolic function normal.  PASP moderately elevated with a peak pressure 49 mmHg.  PMH significant for AN atrial fibrillation diagnosis. CHA2DS2-VASc Score = 7 (age x 2, sex, CHF, previous CVA x 2, aortic plaque).  Patient chronically anticoagulated using daily low-dose apixaban; compliant with therapy with no evidence of GI bleeding.  Rate and rhythm controlled with beta-blocker monotherapy.  Blood pressure elevated 160/80 on currently prescribed beta-blocker, diuretic, and ACEi therapies.  Blood pressure felt to be elevated in setting of pain secondary to acute thoracic compression fracture. Patient is on a statin for her HLD.  Patient is not diabetic.  Patient with age-related debility resulting in frequent falls.  Functional capacity limited by orthopedic pain, age, and multiple medical comorbidities; not able to achieve 4 METS of activity.  Changes were made to patient's acid reflux medications; other changes were made.  Patient follow-up with outpatient cardiology in 4 months or sooner if needed.  Debbie Hampton is scheduled for an T 11 KYPHOPLASTY on 06/05/2021 with Dr. Kennedy Bucker, MD.  Given patient's past medical history significant for cardiovascular diagnoses, presurgical cardiac clearance was sought by the  performing surgeon's office and  PAT team. Per cardiology, "this patient is optimized for surgery and may proceed with the planned procedural course with a LOW risk of significant perioperative cardiovascular complications".  Again, this patient is on daily anticoagulation therapy. She has been instructed on recommendations for holding her daily apixaban for 3 days prior to her procedure with plans to restart as soon as postoperative bleeding risk felt to be minimized by her attending surgeon.  The patient resides in a SNF.  Faxed documentation has been sent to her facility advising that the patient's last dose of apixaban will be on 06/01/2021.  Patient denies previous perioperative complications with anesthesia in the past. In review of the EMR, there are no records available for review regarding patient's past surgical/anesthetic courses within the [facility] system.  Vitals with BMI 05/09/2021 05/09/2021 05/09/2021  Height - - -  Weight - - -  BMI - - -  Systolic 135 198 937  Diastolic 74 105 78  Pulse 83 82 66    Providers/Specialists:   NOTE: Primary physician provider listed below. Patient may have been seen by APP or partner within same practice.   PROVIDER ROLE / SPECIALTY LAST Debbie Rise, MD Orthopedics (Surgeon) 05/30/2021  Debbie Rud, MD Primary Care Provider 04/19/2021  Debbie Millard, MD Cardiology 05/22/2021   Allergies:  Cheese, Mushroom extract complex, Penicillins, Verapamil, Aloe, Amlodipine, Cephalexin, Metoprolol, Miconazole, Neosporin [neomycin-bacitracin zn-polymyx], and Trandolapril-verapamil hcl er  Current Home Medications:   No current facility-administered medications for this encounter.    acetaminophen (TYLENOL) 500 MG tablet   apixaban (ELIQUIS) 2.5 MG TABS tablet   atenolol (TENORMIN) 50 MG tablet   calcium carbonate (TUMS EX) 750 MG chewable tablet   cholecalciferol (VITAMIN D3) 25 MCG (1000 UNIT) tablet   citalopram (CELEXA) 20 MG  tablet   diclofenac Sodium (VOLTAREN) 1 % GEL   Esomeprazole Magnesium 20 MG TBEC   famotidine (PEPCID) 40 MG tablet   Fluocinolone Acetonide 0.01 % OIL   fluticasone (FLONASE) 50 MCG/ACT nasal spray   furosemide (LASIX) 20 MG tablet   guaiFENesin (MUCINEX) 600 MG 12 hr tablet   HYDROcodone-acetaminophen (NORCO/VICODIN) 5-325 MG tablet   iron polysaccharides (NIFEREX) 150 MG capsule   lidocaine (LIDODERM) 5 %   lisinopril (ZESTRIL) 10 MG tablet   liver oil-zinc oxide (DESITIN) 40 % ointment   mometasone (ELOCON) 0.1 % lotion   Multiple Vitamins-Minerals (CERTAVITE/ANTIOXIDANTS) TABS   omega-3 acid ethyl esters (LOVAZA) 1 g capsule   oxymetazoline (AFRIN) 0.05 % nasal spray   Polyethyl Glycol-Propyl Glycol 0.4-0.3 % SOLN   polyethylene glycol (MIRALAX / GLYCOLAX) packet   rosuvastatin (CRESTOR) 20 MG tablet   sodium chloride (OCEAN) 0.65 % SOLN nasal spray   Sunscreens (AVEENO DAILY MOISTURIZER) LOTN   tiZANidine (ZANAFLEX) 2 MG tablet   pantoprazole (PROTONIX) 40 MG tablet   History:   Past Medical History:  Diagnosis Date   Aortic atherosclerosis (HCC)    Arthritis    Atrial fibrillation (HCC)    a.) CHA2DS2-VASc = 7; on low dose apixaban. b.) TTE 07/30/2017 --> EF 60-65%; mild MR and TR.   Bilateral shoulder pain    Carotid artery disease (HCC)    a.)  BILATERAL carotid Doppler on 07/30/2017 --> mild to moderate plaque in BILATERAL ICAs compatible with 50-69% stenosis (R >L).   Chronic anticoagulation    Apixaban   Colon polyp    Constipation    Coronary artery calcification    a.) CT chest on 05/07/2021 --> 3 vessel  CAD.   Depression    DOE (dyspnea on exertion)    Falls    GERD (gastroesophageal reflux disease)    Hiatal hernia    a.) CT chest on 05/07/2021 --> large; distended with fluid.   History of hiatal hernia    HLD (hyperlipidemia)    Hypertension    Impaired fasting glucose    Multiple thyroid nodules    Bilateral   Stroke (HCC) 07/29/2017   a.)  Acute 1.5 cm infarction within the periventricular deep white matter on the RIGHT adjacent to the posterior body of the lateral ventricle.   Syncope 03/2016   with [facility]   Valvular regurgitation    a.) TTE 08/03/2017 --> mild MR and TR.   Varicose veins of both lower extremities    Past Surgical History:  Procedure Laterality Date   COLONOSCOPY     TONSILLECTOMY     TUMOR REMOVAL     ovaries   Family History  Problem Relation Age of Onset   Hypertension Other    Breast cancer Sister    Kidney cancer Neg Hx    Kidney disease Neg Hx    Prostate cancer Neg Hx    Social History   Tobacco Use   Smoking status: Former    Types: Cigarettes    Quit date: 02/16/1984    Years since quitting: 37.3   Smokeless tobacco: Never  Substance Use Topics   Alcohol use: No   Drug use: No    Pertinent Clinical Results:  LABS: Labs reviewed: Acceptable for surgery.  Lab Results  Component Value Date   WBC 6.7 05/07/2021   HGB 10.2 (L) 05/07/2021   HCT 30.3 (L) 05/07/2021   MCV 88.6 05/07/2021   PLT 218 05/07/2021   Lab Results  Component Value Date   NA 132 (L) 05/09/2021   K 4.3 05/09/2021   CO2 26 05/09/2021   GLUCOSE 91 05/09/2021   BUN 18 05/09/2021   CREATININE 0.75 05/09/2021   CALCIUM 9.2 05/09/2021   GFRNONAA >60 05/09/2021   GFRAA >60 11/28/2019    ECG: Date: 05/06/2021 Time ECG obtained: 0301 AM Rate: 91 bpm Rhythm: atrial fibrillation Axis (leads I and aVF): Normal Intervals: QRS 91 ms. QTc 486 ms. ST segment and T wave changes: No evidence of acute ST segment elevation or depression Comparison: Similar to previous tracing obtained on 08/08/2020   IMAGING / PROCEDURES: MRI THORACIC SPINE WITHOUT CONTRAST performed on 05/29/2021 Subacute T11 compression fracture with approximately 50% height loss and 4 mm of retropulsion.  No associated stenosis.   Small pleural effusions. Large hiatal hernia  CT CHEST WITHOUT CONTRAST performed on 05/07/2021 No acute  cardiopulmonary abnormalities. Progressive, linear branching soft tissue density within the right upper lobe corresponding to previously characterize cluster of nodular densities. This likely represents benign chronic mucous plugging within dilated distal bronchials. Small bilateral pleural effusions, right greater than left. Aortic atherosclerosis and 3 vessel coronary artery calcifications. Large hiatal hernia. Bilateral thyroid nodules. These do not require immediate attention and have been noted on previous imaging. If not already performed, further evaluation with nonemergent thyroid US is advised.   TRANSTHORACIC ECHOCARDIOGRAM performed on 07/30/2017 Normal left ventricular systolic function with an EF of 60 to 65% Cavity obliteration in systole Normal left ventricular wall motions Calcified mitral valve annulus with mild valvular regurgitation Mild tricuspid valve regurgitation Mildly to moderately dilated left atrium Right ventricular systolic function normal PASP moderately elevated with a peak pressure of 49 mmHg  BILATERAL  CAROTID DOPPLER performed on 07/30/2017 Moderate to large amount of bilateral atherosclerotic plaque resulting in elevated peak systolic velocities within BILATERAL internal carotid arteries compatible with 50-69% luminal narrowing range (R >L). Antegrade flow noted in the bilateral vertebral arteries.  Impression and Plan:  Debbie Hampton has been referred for pre-anesthesia review and clearance prior to her undergoing the planned anesthetic and procedural courses. Available labs, pertinent testing, and imaging results were personally reviewed by me. This patient has been appropriately cleared by cardiology with an overall LOW risk of significant perioperative cardiovascular complications.  Based on clinical review performed today (06/04/21), barring any significant acute changes in the patient's overall condition, it is anticipated that she will be able to  proceed with the planned surgical intervention. Any acute changes in clinical condition may necessitate her procedure being postponed and/or cancelled. Patient will meet with anesthesia team (MD and/or CRNA) on the day of her procedure for preoperative evaluation/assessment. Questions regarding anesthetic course will be fielded at that time.   Pre-surgical instructions were reviewed with the patient during her PAT appointment and questions were fielded by PAT clinical staff. Patient was advised that if any questions or concerns arise prior to her procedure then she should return a call to PAT and/or her surgeon's office to discuss.  Debbie Mulling, MSN, APRN, FNP-C, CEN [facility]  Peri-operative Services Nurse Practitioner Phone: [phone] Fax: [phone] 06/04/21 9:44 AM  NOTE: This note has been prepared using Dragon dictation software. Despite my best ability to proofread, there is always the potential that unintentional transcriptional errors may still occur from this process.

## 2021-06-05 ENCOUNTER — Ambulatory Visit: Payer: Medicare Other

## 2021-06-05 ENCOUNTER — Encounter
Admission: RE | Disposition: A | Discharge status: Skilled Nursing Facility | Payer: Self-pay | Source: Home / Self Care | Attending: Orthopedic Surgery

## 2021-06-05 ENCOUNTER — Other Ambulatory Visit: Payer: Self-pay

## 2021-06-05 ENCOUNTER — Ambulatory Visit: Payer: Medicare Other | Admitting: Urgent Care

## 2021-06-05 ENCOUNTER — Ambulatory Visit
Admission: RE | Admit: 2021-06-05 | Discharge: 2021-06-06 | Disposition: A | Discharge status: Skilled Nursing Facility | Payer: Medicare Other | Attending: Orthopedic Surgery | Admitting: Orthopedic Surgery

## 2021-06-05 ENCOUNTER — Encounter: Payer: Self-pay | Admitting: Orthopedic Surgery

## 2021-06-05 DIAGNOSIS — Z7901 Long term (current) use of anticoagulants: Secondary | ICD-10-CM | POA: Diagnosis not present

## 2021-06-05 DIAGNOSIS — Z20822 Contact with and (suspected) exposure to covid-19: Secondary | ICD-10-CM | POA: Diagnosis not present

## 2021-06-05 DIAGNOSIS — Z880 Allergy status to penicillin: Secondary | ICD-10-CM | POA: Insufficient documentation

## 2021-06-05 DIAGNOSIS — Z883 Allergy status to other anti-infective agents status: Secondary | ICD-10-CM | POA: Diagnosis not present

## 2021-06-05 DIAGNOSIS — Z79899 Other long term (current) drug therapy: Secondary | ICD-10-CM | POA: Diagnosis not present

## 2021-06-05 DIAGNOSIS — S22080A Wedge compression fracture of T11-T12 vertebra, initial encounter for closed fracture: Secondary | ICD-10-CM | POA: Insufficient documentation

## 2021-06-05 DIAGNOSIS — Z882 Allergy status to sulfonamides status: Secondary | ICD-10-CM | POA: Diagnosis not present

## 2021-06-05 DIAGNOSIS — Z9889 Other specified postprocedural states: Secondary | ICD-10-CM

## 2021-06-05 DIAGNOSIS — X58XXXA Exposure to other specified factors, initial encounter: Secondary | ICD-10-CM | POA: Diagnosis not present

## 2021-06-05 DIAGNOSIS — Z888 Allergy status to other drugs, medicaments and biological substances status: Secondary | ICD-10-CM | POA: Diagnosis not present

## 2021-06-05 DIAGNOSIS — Z881 Allergy status to other antibiotic agents status: Secondary | ICD-10-CM | POA: Insufficient documentation

## 2021-06-05 DIAGNOSIS — Z419 Encounter for procedure for purposes other than remedying health state, unspecified: Secondary | ICD-10-CM

## 2021-06-05 HISTORY — DX: Unspecified fall, initial encounter: W19.XXXA

## 2021-06-05 HISTORY — DX: Nontoxic multinodular goiter: E04.2

## 2021-06-05 HISTORY — PX: KYPHOPLASTY: SHX5884

## 2021-06-05 HISTORY — DX: Endocarditis, valve unspecified: I38

## 2021-06-05 HISTORY — DX: Diaphragmatic hernia without obstruction or gangrene: K44.9

## 2021-06-05 HISTORY — DX: Other forms of dyspnea: R06.09

## 2021-06-05 HISTORY — DX: Depression, unspecified: F32.A

## 2021-06-05 HISTORY — DX: Hyperlipidemia, unspecified: E78.5

## 2021-06-05 HISTORY — DX: Atherosclerotic heart disease of native coronary artery without angina pectoris: I25.10

## 2021-06-05 HISTORY — DX: Disorder of arteries and arterioles, unspecified: I77.9

## 2021-06-05 HISTORY — DX: Repeated falls: R29.6

## 2021-06-05 HISTORY — DX: Dyspnea, unspecified: R06.00

## 2021-06-05 HISTORY — DX: Atherosclerosis of aorta: I70.0

## 2021-06-05 HISTORY — DX: Long term (current) use of anticoagulants: Z79.01

## 2021-06-05 LAB — RESP PANEL BY RT-PCR (FLU A&B, COVID) ARPGX2
Influenza A by PCR: NEGATIVE
Influenza B by PCR: NEGATIVE
SARS Coronavirus 2 by RT PCR: NEGATIVE

## 2021-06-05 SURGERY — KYPHOPLASTY
Anesthesia: General | Site: Spine Thoracic

## 2021-06-05 MED ORDER — LABETALOL HCL 5 MG/ML IV SOLN
INTRAVENOUS | Status: AC
  Filled 2021-06-05: qty 4

## 2021-06-05 MED ORDER — POLYETHYLENE GLYCOL 3350 17 G PO PACK
17.0000 g | PACK | Freq: Every day | ORAL | Status: DC
  Administered 2021-06-05 – 2021-06-06 (×2): 17 g via ORAL
  Filled 2021-06-05 (×2): qty 1

## 2021-06-05 MED ORDER — PANTOPRAZOLE SODIUM 40 MG PO TBEC
40.0000 mg | DELAYED_RELEASE_TABLET | Freq: Every day | ORAL | Status: DC
  Administered 2021-06-06: 40 mg via ORAL
  Filled 2021-06-05: qty 1

## 2021-06-05 MED ORDER — METHOCARBAMOL 500 MG PO TABS: 500.0000 mg | ORAL_TABLET | Freq: Four times a day (QID) | ORAL | Status: DC | PRN

## 2021-06-05 MED ORDER — ONDANSETRON HCL 4 MG/2ML IJ SOLN: 4.0000 mg | Freq: Once | INTRAMUSCULAR | Status: DC | PRN

## 2021-06-05 MED ORDER — CHLORHEXIDINE GLUCONATE 0.12 % MT SOLN
15.0000 mL | Freq: Once | OROMUCOSAL | Status: AC
  Administered 2021-06-05: 15 mL via OROMUCOSAL

## 2021-06-05 MED ORDER — VITAMIN D 25 MCG (1000 UNIT) PO TABS
1000.0000 [IU] | ORAL_TABLET | Freq: Every day | ORAL | Status: DC
  Administered 2021-06-05 – 2021-06-06 (×2): 1000 [IU] via ORAL
  Filled 2021-06-05 (×2): qty 1

## 2021-06-05 MED ORDER — CEFAZOLIN SODIUM-DEXTROSE 2-4 GM/100ML-% IV SOLN
INTRAVENOUS | Status: AC
  Filled 2021-06-05: qty 100

## 2021-06-05 MED ORDER — ORAL CARE MOUTH RINSE: 15.0000 mL | Freq: Once | OROMUCOSAL | Status: AC

## 2021-06-05 MED ORDER — LIDOCAINE HCL (PF) 1 % IJ SOLN
INTRAMUSCULAR | Status: DC | PRN
  Administered 2021-06-05: 40 mL

## 2021-06-05 MED ORDER — SODIUM CHLORIDE 0.9 % IV SOLN: INTRAVENOUS | Status: DC

## 2021-06-05 MED ORDER — ACETAMINOPHEN 10 MG/ML IV SOLN: 1000.0000 mg | Freq: Once | INTRAVENOUS | Status: DC | PRN

## 2021-06-05 MED ORDER — CALCIUM CARBONATE ANTACID 500 MG PO CHEW: 2.0000 | CHEWABLE_TABLET | Freq: Three times a day (TID) | ORAL | Status: DC | PRN

## 2021-06-05 MED ORDER — ATENOLOL 25 MG PO TABS
50.0000 mg | ORAL_TABLET | Freq: Every day | ORAL | Status: DC
  Administered 2021-06-05 – 2021-06-06 (×2): 50 mg via ORAL
  Filled 2021-06-05 (×2): qty 2

## 2021-06-05 MED ORDER — TIZANIDINE HCL 2 MG PO TABS
2.0000 mg | ORAL_TABLET | ORAL | Status: DC | PRN
  Filled 2021-06-05: qty 1

## 2021-06-05 MED ORDER — LIDOCAINE HCL 1 % IJ SOLN
INTRAMUSCULAR | Status: DC | PRN
  Administered 2021-06-05: 10 mL

## 2021-06-05 MED ORDER — FENTANYL CITRATE (PF) 100 MCG/2ML IJ SOLN
INTRAMUSCULAR | Status: AC
  Filled 2021-06-05: qty 2

## 2021-06-05 MED ORDER — SALINE SPRAY 0.65 % NA SOLN
1.0000 | Freq: Three times a day (TID) | NASAL | Status: DC | PRN
  Filled 2021-06-05: qty 44

## 2021-06-05 MED ORDER — CITALOPRAM HYDROBROMIDE 20 MG PO TABS
20.0000 mg | ORAL_TABLET | Freq: Every day | ORAL | Status: DC
  Administered 2021-06-05 – 2021-06-06 (×2): 20 mg via ORAL
  Filled 2021-06-05 (×2): qty 1

## 2021-06-05 MED ORDER — ACETAMINOPHEN 325 MG PO TABS: 325.0000 mg | ORAL_TABLET | Freq: Four times a day (QID) | ORAL | Status: DC | PRN

## 2021-06-05 MED ORDER — POLYSACCHARIDE IRON COMPLEX 150 MG PO CAPS
150.0000 mg | ORAL_CAPSULE | Freq: Every day | ORAL | Status: DC
  Administered 2021-06-05 – 2021-06-06 (×2): 150 mg via ORAL
  Filled 2021-06-05 (×2): qty 1

## 2021-06-05 MED ORDER — ONDANSETRON HCL 4 MG PO TABS: 4.0000 mg | ORAL_TABLET | Freq: Four times a day (QID) | ORAL | Status: DC | PRN

## 2021-06-05 MED ORDER — 0.9 % SODIUM CHLORIDE (POUR BTL) OPTIME
TOPICAL | Status: DC | PRN
  Administered 2021-06-05: 500 mL

## 2021-06-05 MED ORDER — ESOMEPRAZOLE MAGNESIUM 20 MG PO TBEC: 20.0000 mg | DELAYED_RELEASE_TABLET | Freq: Every day | ORAL | Status: DC

## 2021-06-05 MED ORDER — FENTANYL CITRATE (PF) 100 MCG/2ML IJ SOLN: 25.0000 ug | INTRAMUSCULAR | Status: DC | PRN

## 2021-06-05 MED ORDER — MORPHINE SULFATE (PF) 2 MG/ML IV SOLN: 0.5000 mg | INTRAVENOUS | Status: DC | PRN

## 2021-06-05 MED ORDER — BUPIVACAINE-EPINEPHRINE (PF) 0.5% -1:200000 IJ SOLN
INTRAMUSCULAR | Status: AC
  Filled 2021-06-05: qty 30

## 2021-06-05 MED ORDER — LIDOCAINE HCL (PF) 1 % IJ SOLN
INTRAMUSCULAR | Status: AC
  Filled 2021-06-05: qty 30

## 2021-06-05 MED ORDER — PROPOFOL 500 MG/50ML IV EMUL
INTRAVENOUS | Status: DC | PRN
  Administered 2021-06-05: 40 ug/kg/min via INTRAVENOUS

## 2021-06-05 MED ORDER — CHLORHEXIDINE GLUCONATE 0.12 % MT SOLN
OROMUCOSAL | Status: AC
  Filled 2021-06-05: qty 15

## 2021-06-05 MED ORDER — FENTANYL CITRATE (PF) 100 MCG/2ML IJ SOLN
INTRAMUSCULAR | Status: DC | PRN
  Administered 2021-06-05 (×2): 12.5 ug via INTRAVENOUS

## 2021-06-05 MED ORDER — OXYMETAZOLINE HCL 0.05 % NA SOLN
1.0000 | Freq: Three times a day (TID) | NASAL | Status: DC | PRN
  Filled 2021-06-05: qty 15

## 2021-06-05 MED ORDER — ONDANSETRON HCL 4 MG/2ML IJ SOLN: 4.0000 mg | Freq: Four times a day (QID) | INTRAMUSCULAR | Status: DC | PRN

## 2021-06-05 MED ORDER — FAMOTIDINE 20 MG PO TABS
40.0000 mg | ORAL_TABLET | Freq: Every day | ORAL | Status: DC
  Administered 2021-06-05: 40 mg via ORAL
  Filled 2021-06-05: qty 2

## 2021-06-05 MED ORDER — IOHEXOL 180 MG/ML  SOLN
INTRAMUSCULAR | Status: DC | PRN
  Administered 2021-06-05: 10 mL

## 2021-06-05 MED ORDER — ROSUVASTATIN CALCIUM 10 MG PO TABS
20.0000 mg | ORAL_TABLET | Freq: Every day | ORAL | Status: DC
  Administered 2021-06-05: 20 mg via ORAL
  Filled 2021-06-05: qty 2

## 2021-06-05 MED ORDER — CEFAZOLIN SODIUM-DEXTROSE 2-4 GM/100ML-% IV SOLN
2.0000 g | INTRAVENOUS | Status: AC
  Administered 2021-06-05: 1 g via INTRAVENOUS

## 2021-06-05 MED ORDER — FLUOCINOLONE ACETONIDE 0.01 % OT OIL: 2.0000 [drp] | TOPICAL_OIL | OTIC | Status: DC

## 2021-06-05 MED ORDER — FLUTICASONE PROPIONATE 50 MCG/ACT NA SUSP
1.0000 | Freq: Every day | NASAL | Status: DC | PRN
  Filled 2021-06-05: qty 16

## 2021-06-05 MED ORDER — GUAIFENESIN ER 600 MG PO TB12: 600.0000 mg | ORAL_TABLET | Freq: Two times a day (BID) | ORAL | Status: DC | PRN

## 2021-06-05 MED ORDER — LACTATED RINGERS IV SOLN: INTRAVENOUS | Status: DC

## 2021-06-05 MED ORDER — LABETALOL HCL 5 MG/ML IV SOLN
INTRAVENOUS | Status: DC | PRN
Start: 2021-06-05 — End: 2021-06-05
  Administered 2021-06-05 (×2): 2.5 mg via INTRAVENOUS

## 2021-06-05 MED ORDER — LISINOPRIL 10 MG PO TABS
10.0000 mg | ORAL_TABLET | Freq: Every day | ORAL | Status: DC
  Administered 2021-06-05 – 2021-06-06 (×2): 10 mg via ORAL
  Filled 2021-06-05 (×2): qty 1

## 2021-06-05 MED ORDER — ONDANSETRON HCL 4 MG/2ML IJ SOLN
INTRAMUSCULAR | Status: DC | PRN
  Administered 2021-06-05: 4 mg via INTRAVENOUS

## 2021-06-05 MED ORDER — ACETAMINOPHEN 500 MG PO TABS
500.0000 mg | ORAL_TABLET | Freq: Three times a day (TID) | ORAL | Status: DC
  Administered 2021-06-05 – 2021-06-06 (×2): 500 mg via ORAL
  Filled 2021-06-05 (×2): qty 1

## 2021-06-05 MED ORDER — HYDROCODONE-ACETAMINOPHEN 7.5-325 MG PO TABS: 1.0000 | ORAL_TABLET | ORAL | Status: DC | PRN

## 2021-06-05 MED ORDER — FUROSEMIDE 20 MG PO TABS: 20.0000 mg | ORAL_TABLET | Freq: Every day | ORAL | Status: DC | PRN

## 2021-06-05 MED ORDER — POLYVINYL ALCOHOL 1.4 % OP SOLN
1.0000 [drp] | Freq: Three times a day (TID) | OPHTHALMIC | Status: DC | PRN
  Filled 2021-06-05: qty 15

## 2021-06-05 MED ORDER — APIXABAN 2.5 MG PO TABS
2.5000 mg | ORAL_TABLET | Freq: Two times a day (BID) | ORAL | Status: DC
  Administered 2021-06-06: 2.5 mg via ORAL
  Filled 2021-06-05: qty 1

## 2021-06-05 MED ORDER — HYDROCODONE-ACETAMINOPHEN 5-325 MG PO TABS: 1.0000 | ORAL_TABLET | ORAL | Status: DC | PRN

## 2021-06-05 MED ORDER — METHOCARBAMOL 1000 MG/10ML IJ SOLN
500.0000 mg | Freq: Four times a day (QID) | INTRAVENOUS | Status: DC | PRN
  Filled 2021-06-05: qty 5

## 2021-06-05 SURGICAL SUPPLY — 20 items
ADH SKN CLS APL DERMABOND .7 (GAUZE/BANDAGES/DRESSINGS) ×1
CEMENT KYPHON CX01A KIT/MIXER (Cement) ×2 IMPLANT
DERMABOND ADVANCED (GAUZE/BANDAGES/DRESSINGS) ×1
DERMABOND ADVANCED .7 DNX12 (GAUZE/BANDAGES/DRESSINGS) ×1 IMPLANT
DEVICE BIOPSY BONE KYPH (INSTRUMENTS) ×1 IMPLANT
DRAPE C-ARM XRAY 36X54 (DRAPES) ×2 IMPLANT
DURAPREP 26ML APPLICATOR (WOUND CARE) ×2 IMPLANT
GAUZE 4X4 16PLY ~~LOC~~+RFID DBL (SPONGE) ×2 IMPLANT
GLOVE SURG SYN 9.0  PF PI (GLOVE) ×2
GLOVE SURG SYN 9.0 PF PI (GLOVE) ×1 IMPLANT
GOWN SRG 2XL LVL 4 RGLN SLV (GOWNS) ×1 IMPLANT
GOWN STRL NON-REIN 2XL LVL4 (GOWNS) ×2
GOWN STRL REUS W/ TWL LRG LVL3 (GOWN DISPOSABLE) ×1 IMPLANT
GOWN STRL REUS W/TWL LRG LVL3 (GOWN DISPOSABLE) ×2
MANIFOLD NEPTUNE II (INSTRUMENTS) ×2 IMPLANT
PACK KYPHOPLASTY (MISCELLANEOUS) ×2 IMPLANT
STRAP SAFETY 5IN WIDE (MISCELLANEOUS) ×2 IMPLANT
SWABSTK COMLB BENZOIN TINCTURE (MISCELLANEOUS) ×2 IMPLANT
TRAY KYPHOPAK 15/2 EXPRESS (KITS) ×1 IMPLANT
WATER STERILE IRR 500ML POUR (IV SOLUTION) ×2 IMPLANT

## 2021-06-05 NOTE — Op Note (Signed)
06/05/2021  2:56 PM  PATIENT:  Debbie Hampton   MRN: [id number]   PRE-OPERATIVE DIAGNOSIS:  closed wedge compression fracture of T11 traumatic   POST-OPERATIVE DIAGNOSIS:  closed wedge compression fracture of T11 traumatic   PROCEDURE:  Procedure(s): KYPHOPLASTY T11  SURGEON: Laurene Footman, MD   ASSISTANTS: None   ANESTHESIA:   local and MAC   EBL:  No intake/output data recorded.   BLOOD ADMINISTERED:none   DRAINS: none    LOCAL MEDICATIONS USED:  MARCAINE    and XYLOCAINE    SPECIMEN: T11 vertebral body biopsy   DISPOSITION OF SPECIMEN: Pathology   COUNTS:  YES   TOURNIQUET:  * No tourniquets in log *   IMPLANTS: Bone cement   DICTATION: .Dragon Dictation  patient was brought to the operating room and after adequate anesthesia was obtained the patient was placed prone.  C arm was brought in in good visualization of the affected level obtained on both AP and lateral projections.  After patient identification and timeout procedures were completed, local anesthetic was infiltrated with 10 cc 1% Xylocaine infiltrated subcutaneously.  This is done the area on the each side of the planned approach.  The back was then prepped and draped in the usual sterile manner and repeat timeout procedure carried out.  A spinal needle was brought down to the pedicle on the each side of T11 and a 50-50 mix of 1% Xylocaine half percent Sensorcaine with epinephrine total of 20 cc injected on each side.  After allowing this to set a small incision was made and the trocar was advanced into the vertebral body in an extrapedicular fashion.  Biopsy was obtained drilling was carried out balloon inserted with inflation to 2.5 cc on the right and across the midline so left-sided stick was not required when the cement was appropriate consistency 3 cc were injected on the right into the vertebral body without extravasation, good fill superior to inferior endplates and from right to left sides along the inferior  endplate.  After the cement had set the trochar was removed and permanent C-arm views obtained.  The wound was closed with Dermabond followed by Band-Aid   PLAN OF CARE: Overnight observation   PATIENT DISPOSITION:  PACU - hemodynamically stable.

## 2021-06-05 NOTE — H&P (Signed)
Chief Complaint  Patient presents with   Middle Back - Pain  T11 fx per MRI    History of the Present Illness: Debbie Hampton is a 87 y.o. female here today.   The patient presents for evaluation of T11 compression fracture. The patient has been followed by neurosurgery here. She had an MRI yesterday at [facility] showing extensive edema throughout the body, with what appears to be potential nonunion, 50% height loss with some retropulsion without cord compression. She has been having unrelenting pain. She was initially seen in the hospital in 04/2021 after a mechanical fall. She went to skilled care, but still was not able to ambulate. She comes in today to discuss possible kyphoplasty.  The patient slide out of her wheelchair causing her compression fracture. She is accompanied by her sons. She rates her pain as 8 out of 10 today, and the pain can radiate around her sides. She is currently at [facility]. Before that she was at [facility]. Prior to her fracture, she was able to get around in the wheeled walker to the dining room 3 times a day.  The patient takes Eliquis. Her primary care physician is Nadara Mustard, MD. She has an assigned doctor at [facility].   I have reviewed past medical, surgical, social and family history, and allergies as documented in the EMR.  Past Medical History: Past Medical History:  Diagnosis Date   Constipation   GERD (gastroesophageal reflux disease)   History of colonic polyps   Hyperlipidemia   Hypertension   Varicose veins of left lower extremity with ulcer of ankle (CMS-HCC) 10/30/2015   Past Surgical History: Past Surgical History:  Procedure Laterality Date   CATARACT EXTRACTION   OOPHORECTOMY Bilateral   REMOVAL OVARIAN CYST Bilateral   TONSILLECTOMY   Past Family History: Family History  Problem Relation Age of Onset   Breast cancer Sister   Medications: Current Outpatient Medications Ordered in Epic   Medication Sig Dispense Refill   acetaminophen (TYLENOL) 500 MG tablet Take 500 mg by mouth every 8 (eight) hours as needed for Pain   apixaban (ELIQUIS) 2.5 mg tablet Take 1 tablet (2.5 mg total) by mouth every 12 (twelve) hours 60 tablet 11   atenoloL (TENORMIN) 50 MG tablet Take 1 tablet (50 mg total) by mouth once daily 90 tablet 1   calcium carbonate (TUMS E-X) 300 mg (750 mg) chewable tablet Take 300 mg of elemental by mouth every 2 (two) hours as needed for Heartburn   cholecalciferol (VITAMIN D3) 2,000 unit tablet Take 2,000 Units by mouth once daily   citalopram (CELEXA) 20 MG tablet Take 20 mg by mouth once daily   clobetasoL (TEMOVATE) 0.05 % cream APPLY TOPICALLY TO AFFECTED AREA TWICE A DAY AS NEEDED FOR RASH. 30 g 1   DERMACLOUD Oint ointment   diclofenac (VOLTAREN) 1 % topical gel   esomeprazole (NEXIUM) 20 MG DR capsule Take 1 capsule (20 mg total) by mouth once daily 90 capsule 2   fluocinolone acetonide (DERMOTIC) 0.01 % otic drop   fluticasone propionate (FLONASE) 50 mcg/actuation nasal spray Place 2 sprays into both nostrils once daily   FUROsemide (LASIX) 20 MG tablet Take 1 tablet (20 mg total) by mouth once daily 90 tablet 3   guaiFENesin (MUCINEX) 600 mg SR tablet Take 600 mg by mouth every 12 (twelve) hours as needed for Cough   HYDROcodone-acetaminophen (NORCO) 5-325 mg tablet Take 1 tablet by mouth every 6 (  six) hours as needed for Pain   iron ag,ps-C-[id number]-Zn-sa-sto (NIFEREX, SUMALATE-QUATREFOLIC,) 150 mg iron- 60 mg-1 mg Tab Take by mouth   lidocaine (LIDODERM) 5 % patch Place 1 patch onto the skin daily Apply patch to the most painful area for up to 12 hours in a 24 hour period.   lisinopriL (ZESTRIL) 10 MG tablet TAKE (1) TABLET BY MOUTH ONCE A DAY 30 tablet 10   magnesium hydroxide (MILK OF MAGNESIA) 400 mg/5 mL suspension Take 5 mLs by mouth once daily as needed for Constipation   mometasone (ELOCON) 0.1 % lotion Apply topically   multivitamin tablet Take 1  tablet by mouth once daily   omega-3 acid ethyl esters (LOVAZA) 1 gram capsule Take 2 g by mouth 2 (two) times daily   oxymetazoline (AFRIN) 0.05 % nasal spray Place into both nostrils 2 (two) times daily   peg 400-propylene glycol (SYSTANE ULTRA) 0.4-0.3 % drops Apply to eye   polyethylene glycol (MIRALAX) packet Take 17 g by mouth once daily Mix in 4-8ounces of fluid prior to taking.   rosuvastatin (CRESTOR) 20 MG tablet Take 20 mg by mouth once daily   sodium chloride (AYR) 0.65 % nasal drops Place into one nostril   tiZANidine (ZANAFLEX) 2 MG tablet TAKE ONE(1) TABLET BY MOUTH TWICE A DAY AS NEEDED FOR MUSCLE SPASM. 60 tablet 0   zinc oxide 10 % ointment Apply topically as needed for Rash   iron polysaccharides (FERREX) 150 mg iron capsule Take 150 mg by mouth once daily (Patient not taking: Reported on 05/30/2021)   No current Epic-ordered facility-administered medications on file.   Allergies: Allergies  Allergen Reactions   Aloe Unknown   Cheese Other (See Comments)  GOAT cheese   Keflex [Cephalexin] Rash   Miconazole Unknown   Neosporin [Neomycin-Polymyxin] Unknown   Norvasc [Amlodipine] Unknown   Penicillin G Unknown   Shiitake Mushroom (Lentinus Edodes) Unknown   Sulfa (Sulfonamide Antibiotics) Unknown   Tarka [Trandolapril-Verapamil] Rash   Toprol Xl [Metoprolol Succinate] Unknown   Trandolapril Other (See Comments)   Verapamil Other (See Comments)    Body mass index is 24.51 kg/m.  Review of Systems: A comprehensive 14 point ROS was performed, reviewed, and the pertinent orthopaedic findings are documented in the HPI.  Vitals:  05/30/21 1253  BP: 112/66    General Physical Examination:   General/Constitutional: No apparent distress: well-nourished and well developed. Eyes: Pupils equal, round with synchronous movement. Lungs: Clear to auscultation HEENT: She is missing several teeth, otherwise in good repair. Vascular: No edema, swelling or tenderness,  except as noted in detailed exam. Cardiac: Heart rate and rhythm is regular. Integumentary: No impressive skin lesions present, except as noted in detailed exam. Neuro/Psych: Normal mood and affect, oriented to person, place and time.  On exam, tenderness to T11.  Radiographs:  No new imaging studies were obtained or reviewed today.  Assessment: ICD-10-CM  1. Closed wedge compression fracture of T11 vertebra with delayed healing, subsequent encounter S22.080G   Plan:  The patient has clinical findings of very painful T11 compression fracture secondary to trauma.  We discussed the patient's prior MRI findings. I recommend T11 kyphoplasty next week when she has been off of Eliquis for 3 days. I explained the surgery and postoperative course in detail.  Surgical Risks:  The nature of the condition and the proposed procedure has been reviewed in detail with the patient. Surgical versus non-surgical options and prognosis for recovery have been reviewed and the inherent risks  and benefits of each have been discussed including the risks of infection, bleeding, injury to nerves/blood vessels/tendons, incomplete relief of symptoms, persisting pain and/or stiffness, loss of function, complex regional pain syndrome, failure of the procedure, as appropriate.  Teeth: She is missing several teeth, otherwise in good repair.  Scribe Attestation: I, Dawn Royse, am acting as scribe for [employer], MD.   Electronically signed by Marlena Clipper, MD at 05/31/2021 7:34 PM EDT  Reviewed  H+P. No changes noted.

## 2021-06-05 NOTE — Anesthesia Preprocedure Evaluation (Signed)
Anesthesia Evaluation  Patient identified by MRN, date of birth, ID band Patient awake    Reviewed: Allergy & Precautions, NPO status , Patient's Chart, lab work & pertinent test results  History of Anesthesia Complications Negative for: history of anesthetic complications  Airway Mallampati: II  TM Distance: >3 FB Neck ROM: Full    Dental  (+) Poor Dentition, Chipped   Pulmonary neg pulmonary ROS, neg sleep apnea, neg COPD, Patient abstained from smoking.Not current smoker, former smoker,    Pulmonary exam normal breath sounds clear to auscultation       Cardiovascular Exercise Tolerance: Poor METShypertension, + CAD and + DOE  (-) Past MI + dysrhythmias Atrial Fibrillation  Rhythm:Irregular Rate:Normal - Systolic murmurs    Neuro/Psych PSYCHIATRIC DISORDERS Depression CVA, No Residual Symptoms    GI/Hepatic GERD  ,(+)     (-) substance abuse  ,   Endo/Other  diabetesHypothyroidism   Renal/GU negative Renal ROS     Musculoskeletal  (+) Arthritis ,   Abdominal   Peds  Hematology  (+) anemia ,   Anesthesia Other Findings Past Medical History: No date: Aortic atherosclerosis (HCC) No date: Arthritis No date: Atrial fibrillation [facility])     Comment:  a.) CHA2DS2-VASc = 7; on low dose apixaban. b.) TTE               07/30/2017 --> EF 60-65%; mild MR and TR. No date: Bilateral shoulder pain No date: Carotid artery disease (HCC)     Comment:  a.)  BILATERAL carotid Doppler on 07/30/2017 --> mild to              moderate plaque in BILATERAL ICAs compatible with 50-69%               stenosis (R >L). No date: Chronic anticoagulation     Comment:  Apixaban No date: Colon polyp No date: Constipation No date: Coronary artery calcification     Comment:  a.) CT chest on 05/07/2021 --> 3 vessel CAD. No date: Depression No date: DOE (dyspnea on exertion) No date: Falls No date: GERD (gastroesophageal reflux  disease) No date: Hiatal hernia     Comment:  a.) CT chest on 05/07/2021 --> large; distended with               fluid. No date: History of hiatal hernia No date: HLD (hyperlipidemia) No date: Hypertension No date: Impaired fasting glucose No date: Multiple thyroid nodules     Comment:  Bilateral 07/29/2017: Stroke [facility])     Comment:  a.) Acute 1.5 cm infarction within the periventricular               deep white matter on the RIGHT adjacent to the posterior               body of the lateral ventricle. 03/2016: Syncope     Comment:  with [facility] No date: Valvular regurgitation     Comment:  a.) TTE 08/03/2017 --> mild MR and TR. No date: Varicose veins of both lower extremities  Reproductive/Obstetrics                             Anesthesia Physical Anesthesia Plan  ASA: 3  Anesthesia Plan: General   Post-op Pain Management:    Induction: Intravenous  PONV Risk Score and Plan: 3 and Ondansetron, Propofol infusion and TIVA  Airway Management Planned: Nasal Cannula  Additional Equipment: None  Intra-op  Plan:   Post-operative Plan:   Informed Consent: I have reviewed the patients History and Physical, chart, labs and discussed the procedure including the risks, benefits and alternatives for the proposed anesthesia with the patient or authorized representative who has indicated his/her understanding and acceptance.     Dental advisory given  Plan Discussed with: CRNA and Surgeon  Anesthesia Plan Comments: (Discussed risks of anesthesia with patient, including possibility of difficulty with spontaneous ventilation under anesthesia necessitating airway intervention, PONV, and rare risks such as cardiac or respiratory or neurological events, and allergic reactions. Patient understands.)        Anesthesia Quick Evaluation

## 2021-06-05 NOTE — Plan of Care (Signed)

## 2021-06-05 NOTE — Transfer of Care (Signed)
Immediate Anesthesia Transfer of Care Note  Patient: Debbie Hampton  Procedure(s) Performed: T 11 KYPHOPLASTY (Spine Thoracic)  Patient Location: PACU  Anesthesia Type:General  Level of Consciousness: awake, alert  and oriented  Airway & Oxygen Therapy: Patient Spontanous Breathing  Post-op Assessment: Report given to RN and Post -op Vital signs reviewed and stable  Post vital signs: Reviewed and stable  Last Vitals:  Vitals Value Taken Time  BP 145/79 06/05/21 1507  Temp    Pulse 86 06/05/21 1509  Resp 19 06/05/21 1509  SpO2 96 % 06/05/21 1509  Vitals shown include unvalidated device data.  Last Pain:  Vitals:   06/05/21 1356  TempSrc: Temporal  PainSc: 6       Patients Stated Pain Goal: 2 (06/05/21 1356)  Complications: No notable events documented.

## 2021-06-05 NOTE — Anesthesia Procedure Notes (Signed)
Procedure Name: MAC Date/Time: 06/05/2021 2:16 PM Performed by: Lily Peer, Clyde Zarrella, CRNA Pre-anesthesia Checklist: Patient identified, Emergency Drugs available, Suction available and Patient being monitored Patient Re-evaluated:Patient Re-evaluated prior to induction Oxygen Delivery Method: Nasal cannula Preoxygenation: Pre-oxygenation with 100% oxygen Induction Type: IV induction

## 2021-06-05 NOTE — Anesthesia Postprocedure Evaluation (Signed)
Anesthesia Post Note  Patient: Debbie Hampton  Procedure(s) Performed: T 11 KYPHOPLASTY (Spine Thoracic)  Patient location during evaluation: PACU Anesthesia Type: General Level of consciousness: awake and alert Pain management: pain level controlled Vital Signs Assessment: post-procedure vital signs reviewed and stable Respiratory status: spontaneous breathing, nonlabored ventilation, respiratory function stable and patient connected to nasal cannula oxygen Cardiovascular status: blood pressure returned to baseline and stable Postop Assessment: no apparent nausea or vomiting Anesthetic complications: no   No notable events documented.   Last Vitals:  Vitals:   06/05/21 1505 06/05/21 1515  BP: (!) 145/79 (!) 186/84  Pulse: 82 81  Resp:  (!) 24  Temp: 36.5 C   SpO2: 95% 94%    Last Pain:  Vitals:   06/05/21 1505  TempSrc:   PainSc: 0-No pain                 Corinda Gubler

## 2021-06-06 ENCOUNTER — Encounter: Payer: Self-pay | Admitting: Orthopedic Surgery

## 2021-06-06 ENCOUNTER — Ambulatory Visit: Payer: Medicare Other

## 2021-06-06 DIAGNOSIS — S22080A Wedge compression fracture of T11-T12 vertebra, initial encounter for closed fracture: Secondary | ICD-10-CM | POA: Diagnosis not present

## 2021-06-06 MED ORDER — HYDROCODONE-ACETAMINOPHEN 5-325 MG PO TABS: 1.0000 | ORAL_TABLET | ORAL | 0 refills | Status: DC | PRN

## 2021-06-06 NOTE — Discharge Summary (Signed)
Physician Discharge Summary  [name] MRN: [id number] DOB/AGE: 85/02/1932 85 y.o.  Admit date: 06/05/2021 Discharge date: 06/06/2021  Admission Diagnoses:  Status post kyphoplasty [Z98.890]   Discharge Diagnoses: Patient Active Problem List   Diagnosis Date Noted   Status post kyphoplasty 06/05/2021   Hypothyroidism 05/07/2021   Type 2 diabetes mellitus without complications (HCC) 05/07/2021   Compression fracture of body of thoracic vertebra (HCC) 05/07/2021   Hypertensive urgency 05/06/2021   Compression fracture of T11 vertebra (HCC) 05/06/2021   History of stroke 12/06/2019   Atrial fibrillation, controlled (HCC) 12/06/2019   Atrial fibrillation (HCC)    Hypertension    Elevated troponin 11/26/2019   Closed Colles' fracture of left radius 10/03/2017   Labile blood pressure    Benign essential HTN    Hypertensive crisis    Hyponatremia    Prediabetes    Hypoalbuminemia due to protein-calorie malnutrition (HCC)    Acute blood loss anemia    Stroke (cerebrum) (HCC) 08/01/2017   Acute CVA (cerebrovascular accident) (HCC) 07/29/2017   Syncope, near 03/31/2016   Gastro-esophageal reflux disease without esophagitis 02/15/2014    Past Medical History:  Diagnosis Date   Aortic atherosclerosis (HCC)    Arthritis    Atrial fibrillation (HCC)    a.) CHA2DS2-VASc = 7; on low dose apixaban. b.) TTE 07/30/2017 --> EF 60-65%; mild MR and TR.   Bilateral shoulder pain    Carotid artery disease (HCC)    a.)  BILATERAL carotid Doppler on 07/30/2017 --> mild to moderate plaque in BILATERAL ICAs compatible with 50-69% stenosis (R >L).   Chronic anticoagulation    Apixaban   Colon polyp    Constipation    Coronary artery calcification    a.) CT chest on 05/07/2021 --> 3 vessel CAD.   Depression    DOE (dyspnea on exertion)    Falls    GERD (gastroesophageal reflux disease)    Hiatal hernia    a.) CT chest on 05/07/2021 --> large; distended with fluid.    History of hiatal hernia    HLD (hyperlipidemia)    Hypertension    Impaired fasting glucose    Multiple thyroid nodules    Bilateral   Stroke (HCC) 07/29/2017   a.) Acute 1.5 cm infarction within the periventricular deep white matter on the RIGHT adjacent to the posterior body of the lateral ventricle.   Syncope 03/2016   with [facility]   Valvular regurgitation    a.) TTE 08/03/2017 --> mild MR and TR.   Varicose veins of both lower extremities      Transfusion: None   Consultants (if any):   Discharged Condition: Improved  Hospital Course: Debbie Hampton is an 87 y.o. female who was admitted 06/05/2021 with a diagnosis of T12 compression fracture and went to the operating room on 06/05/2021 and underwent the above named procedures.    Surgeries: Procedure(s): T 11 KYPHOPLASTY on 06/05/2021 Patient tolerated the surgery well. Taken to PACU where she was stabilized and then transferred to the orthopedic floor.  On postop day 1, back pain improved.  Vital signs are stable.  Patient doing well with no complaints.  Patient stable and ready for discharge back to assisted living facility    She was given perioperative antibiotics:  Anti-infectives (From admission, onward)    Start     Dose/Rate Route Frequency Ordered Stop   06/05/21 1242  ceFAZolin (ANCEF) 2-4 GM/100ML-% IVPB       Note to Pharmacy: Register, Clydie Braun   :  cabinet override      06/05/21 1242 06/05/21 1422   06/05/21 0600  ceFAZolin (ANCEF) IVPB 2g/100 mL premix        2 g 200 mL/hr over 30 Minutes Intravenous On call to O.R. 06/05/21 [id number] 06/05/21 1451     .   She benefited maximally from the hospital stay and there were no complications.    Recent vital signs:  Vitals:   06/06/21 0807 06/06/21 0840  BP: (!) 198/99 (!) 175/84  Pulse: 81 62  Resp: 16   Temp: 97.7 F (36.5 C)   SpO2: 97%     Recent laboratory studies:  Lab Results  Component Value Date   HGB 10.2 (L) 05/07/2021   HGB 12.2 05/06/2021   HGB  11.3 (L) 04/13/2021   Lab Results  Component Value Date   WBC 6.7 05/07/2021   PLT 218 05/07/2021   Lab Results  Component Value Date   INR 1.1 04/13/2021   Lab Results  Component Value Date   NA 132 (L) 05/09/2021   K 4.3 05/09/2021   CL 101 05/09/2021   CO2 26 05/09/2021   BUN 18 05/09/2021   CREATININE 0.75 05/09/2021   GLUCOSE 91 05/09/2021    Discharge Medications:   Allergies as of 06/06/2021       Reactions   Cheese    GOAT cheese   Mushroom Extract Complex    Penicillins    Has patient had a PCN reaction causing immediate rash, facial/tongue/throat swelling, SOB or lightheadedness with hypotension: Yes Has patient had a PCN reaction causing severe rash involving mucus membranes or skin necrosis: No Has patient had a PCN reaction that required hospitalization No Has patient had a PCN reaction occurring within the last 10 years: Yes If all of the above answers are "NO", then may proceed with Cephalosporin use.   Verapamil    Aloe Hives, Rash   Amlodipine Hives, Rash, Hypertension   Cephalexin Rash   Metoprolol Hives, Palpitations, Hypertension   Succinate   Miconazole Swelling, Rash   Neosporin [neomycin-bacitracin Zn-polymyx] Rash   Trandolapril-verapamil Hcl Er Rash        Medication List     STOP taking these medications    pantoprazole 40 MG tablet Commonly known as: PROTONIX       TAKE these medications    acetaminophen 500 MG tablet Commonly known as: TYLENOL Take 500 mg by mouth 3 (three) times daily.   apixaban 2.5 MG Tabs tablet Commonly known as: ELIQUIS Take 2.5 mg by mouth 2 (two) times daily.   atenolol 50 MG tablet Commonly known as: TENORMIN Take 1 tablet (50 mg total) by mouth 2 (two) times daily. What changed: when to take this   Aveeno Daily Moisturizer Lotn Apply topically 2 (two) times daily as needed (for dry skin).   calcium carbonate 750 MG chewable tablet Commonly known as: TUMS EX Chew 2 tablets by mouth 3  (three) times daily as needed for heartburn.   CertaVite/Antioxidants Tabs Take 1 tablet by mouth daily.   cholecalciferol 25 MCG (1000 UNIT) tablet Commonly known as: VITAMIN D3 Take 1,000 Units by mouth daily.   citalopram 20 MG tablet Commonly known as: CELEXA Take 20 mg by mouth daily.   diclofenac Sodium 1 % Gel Commonly known as: VOLTAREN Apply 2 g topically 4 (four) times daily.   Esomeprazole Magnesium 20 MG Tbec Take 20 mg by mouth daily at 12 noon.   famotidine 40 MG tablet Commonly known as:  PEPCID Take 40 mg by mouth at bedtime.   Fluocinolone Acetonide 0.01 % Oil Place 2 drops into both ears once a week.   fluticasone 50 MCG/ACT nasal spray Commonly known as: FLONASE Place 1 spray into both nostrils daily as needed for allergies or rhinitis.   furosemide 20 MG tablet Commonly known as: LASIX Take 1 tablet (20 mg total) by mouth daily as needed. For SOB or leg edema   guaiFENesin 600 MG 12 hr tablet Commonly known as: MUCINEX Take 600 mg by mouth 2 (two) times daily as needed (congestion).   HYDROcodone-acetaminophen 5-325 MG tablet Commonly known as: NORCO/VICODIN Take 1-2 tablets by mouth every 4 (four) hours as needed for moderate pain (pain score 4-6). What changed:  how much to take when to take this reasons to take this   iron polysaccharides 150 MG capsule Commonly known as: NIFEREX Take 150 mg by mouth daily.   lidocaine 5 % Commonly known as: LIDODERM Place 1 patch onto the skin daily. Remove & Discard patch within 12 hours or as directed by MD   lisinopril 10 MG tablet Commonly known as: ZESTRIL Take 10 mg by mouth daily.   liver oil-zinc oxide 40 % ointment Commonly known as: DESITIN Apply 1 application topically in the morning, at noon, and at bedtime. Apply to buttocks   mometasone 0.1 % lotion Commonly known as: ELOCON Apply 4 drops topically at bedtime as needed for itching or dry skin. Place into each ear.   omega-3 acid  ethyl esters 1 g capsule Commonly known as: LOVAZA Take 1 g by mouth 2 (two) times daily.   oxymetazoline 0.05 % nasal spray Commonly known as: AFRIN Place 1 spray into both nostrils 3 (three) times daily as needed for congestion.   Polyethyl Glycol-Propyl Glycol 0.4-0.3 % Soln Place 1 drop into both eyes 3 (three) times daily as needed (dry eyes).   polyethylene glycol 17 g packet Commonly known as: MIRALAX / GLYCOLAX Take 17 g by mouth daily.   rosuvastatin 20 MG tablet Commonly known as: CRESTOR Take 20 mg by mouth at bedtime.   sodium chloride 0.65 % Soln nasal spray Commonly known as: OCEAN Place 1 spray into both nostrils 3 (three) times daily as needed for congestion.   tiZANidine 2 MG tablet Commonly known as: ZANAFLEX Take 2 mg by mouth as needed (twice daily as needed).        Diagnostic Studies: DG Thoracic Spine 2 View  Result Date: 06/05/2021 CLINICAL DATA:  T11 kyphoplasty. EXAM: DG C-ARM 1-60 MIN; THORACIC SPINE 2 VIEWS FLUOROSCOPY TIME:  Fluoroscopy Time:  1 minute 37 seconds. Number of Acquired Spot Images: 2 COMPARISON:  MRI of the thoracic spine from 05/29/2021 FINDINGS: Two C-arm fluoroscopic images were obtained intraoperatively and submitted for post operative interpretation. These images demonstrate kyphoplasty at T11. No evidence of extravasation. Please see the performing provider's procedural report for further detail. IMPRESSION: Intraoperative fluoroscopy, as detailed above. Electronically Signed   By: Feliberto Harts M.D.   On: 06/05/2021 15:32   CT HEAD WO CONTRAST ( )  Result Date: 05/07/2021 CLINICAL DATA:  Altered level of consciousness EXAM: CT HEAD WITHOUT CONTRAST TECHNIQUE: Contiguous axial images were obtained from the base of the skull through the vertex without intravenous contrast. COMPARISON:  07/16/2020 FINDINGS: Brain: Evaluation is slightly limited due to patient motion during the exam. Chronic hypodensities throughout the  periventricular and subcortical white matter consistent with chronic small vessel ischemic change. No acute infarct or hemorrhage. Lateral  ventricles and remaining midline structures are unremarkable. No acute extra-axial fluid collections. No mass effect. Vascular: Stable atherosclerosis.  No hyperdense vessel. Skull: Normal. Negative for fracture or focal lesion. Sinuses/Orbits: Chronic inspissated secretions within the left maxillary sinus. Remaining paranasal sinuses are clear. Marked temporomandibular joint osteoarthritis, right greater than left, which has progressed since prior study. Other: None. IMPRESSION: 1. Chronic small vessel ischemic changes. No acute intracranial process. 2. No acute hemorrhage. Electronically Signed   By: Sharlet Salina M.D.   On: 05/07/2021 21:22   CT CHEST WO CONTRAST  Result Date: 05/07/2021 CLINICAL DATA:  History pulmonary nodule.  Hyponatremia. EXAM: CT CHEST WITHOUT CONTRAST TECHNIQUE: Multidetector CT imaging of the chest was performed following the standard protocol without IV contrast. COMPARISON:  08/22/2018 FINDINGS: Cardiovascular: Moderate cardiac enlargement. Aortic atherosclerosis and 3 vessel coronary artery calcifications. Mediastinum/Nodes: Bilateral thyroid nodules. The largest is in the posterior left lobe measuring 1.5 cm, image 8/2. The trachea appears patent and is midline. The esophagus is dilated with air fluid level. Large hiatal hernia is identified which appears distended with fluid. No enlarged lymph nodes. Lungs/Pleura: Small bilateral pleural effusions, right greater than left. Platelike atelectasis noted within the lung bases. No significant interstitial edema. No airspace consolidation. There is a linear branching soft tissue density within the anterior right upper lobe, this is increased in size from previous exam and likely represents chronic mucous plugging in a dilated bronchial, image 30/3. This corresponds to the previously described  cluster of nodular densities within the right upper lobe seen on 08/22/2018. Upper Abdomen: No acute abnormality. Musculoskeletal: Advanced changes of osteoarthritis noted involving both glenohumeral joints. Multilevel thoracic degenerative disc disease. Compression deformity within the lower thoracic spine is identified as noted on 05/06/2021. Here, there is loss of approximately 50% of the vertebral body height. IMPRESSION: 1. No acute cardiopulmonary abnormalities. 2. Progressive, linear branching soft tissue density within the right upper lobe corresponding to previously characterize cluster of nodular densities. This likely represents benign chronic mucous plugging within dilated distal bronchials. 3. Small bilateral pleural effusions, right greater than left. 4. Aortic atherosclerosis and 3 vessel coronary artery calcifications. 5. Large hiatal hernia. 6. Bilateral thyroid nodules. These do not require immediate attention and have been noted on previous imaging. If not already performed, further evaluation with nonemergent thyroid US is advised. (Ref: J Am Coll Radiol. 2015 Feb;12(2): 143-50). Aortic Atherosclerosis (ICD10-I70.0). Electronically Signed   By: Signa Kell M.D.   On: 05/07/2021 14:38   MR THORACIC SPINE WO CONTRAST  Result Date: 05/29/2021 CLINICAL DATA:  T11 fracture after fall 4 weeks ago EXAM: MRI THORACIC SPINE WITHOUT CONTRAST TECHNIQUE: Multiplanar, multisequence MR imaging of the thoracic spine was performed. No intravenous contrast was administered. COMPARISON:  None. FINDINGS: Alignment:  Physiologic. Vertebrae: Compression fracture of T11 with approximately 50% height loss and 4 mm of retropulsion. No other fracture or acute osseous lesion. Cord:  Normal signal and morphology. Paraspinal and other soft tissues: Small pleural effusions. Large hiatal hernia. Disc levels: No disc herniation or spinal canal stenosis. Retropulsion at T11 minimally narrows the ventral thecal sac.  IMPRESSION: 1. Subacute T11 compression fracture with approximately 50% height loss and 4 mm of retropulsion. No associated stenosis. 2. Small pleural effusions. 3. Large hiatal hernia. Electronically Signed   By: Deatra Robinson M.D.   On: 05/29/2021 20:05   DG C-Arm 1-60 Min  Result Date: 06/05/2021 CLINICAL DATA:  T11 kyphoplasty. EXAM: DG C-ARM 1-60 MIN; THORACIC SPINE 2 VIEWS FLUOROSCOPY TIME:  Fluoroscopy Time:  1 minute 37 seconds. Number of Acquired Spot Images: 2 COMPARISON:  MRI of the thoracic spine from 05/29/2021 FINDINGS: Two C-arm fluoroscopic images were obtained intraoperatively and submitted for post operative interpretation. These images demonstrate kyphoplasty at T11. No evidence of extravasation. Please see the performing provider's procedural report for further detail. IMPRESSION: Intraoperative fluoroscopy, as detailed above. Electronically Signed   By: Feliberto Harts M.D.   On: 06/05/2021 15:32    Disposition: Discharge disposition: 01-Home or Self Care         Follow-up Information     Evon Slack, PA-C Follow up in 2 week(s).   Specialties: Orthopedic Surgery, Emergency Medicine Contact information: [address] [place] [address] [phone]                  Signed: Patience Musca 06/06/2021, 9:30 AM

## 2021-06-06 NOTE — Progress Notes (Signed)
Subjective: 1 Day Post-Op Procedure(s) (LRB): T 11 KYPHOPLASTY (N/A) Patient reports pain as mild.  Back pain improved following surgery. Patient is well, and has had no acute complaints or problems Denies any CP, SOB, ABD pain. We will continue therapy today.  Plans to discharge back to [facility].  Objective: Vital signs in last 24 hours: Temp:  [97 F (36.1 C)-98.8 F (37.1 C)] 97.7 F (36.5 C) (09/21 0807) Pulse Rate:  [60-104] 62 (09/21 0840) Resp:  [16-27] 16 (09/21 0807) BP: (106-198)/(47-117) 175/84 (09/21 0840) SpO2:  [90 %-97 %] 97 % (09/21 0807) Weight:  [59.9 kg] 59.9 kg (09/20 1356)  Intake/Output from previous day: 09/20 0701 - 09/21 0700 In: 360 [P.O.:360] Out: 5 [Blood:5] Intake/Output this shift: No intake/output data recorded.  No results for input(s): HGB in the last 72 hours. No results for input(s): WBC, RBC, HCT, PLT in the last 72 hours. No results for input(s): NA, K, CL, CO2, BUN, CREATININE, GLUCOSE, CALCIUM in the last 72 hours. No results for input(s): LABPT, INR in the last 72 hours.  EXAM General - Patient is Alert, Appropriate, and Oriented patient no distress.  Appears comfortable sitting up in bed. Extremity - Neurovascular intact Sensation intact distally Intact pulses distally Dorsiflexion/Plantar flexion intact Thoracolumbar spine -no spinous process tenderness.  Incision site is intact.  No paravertebral muscle swelling or tenderness. Dressing - dressing C/D/I and no drainage Motor Function - intact, moving foot and toes well on exam.   Past Medical History:  Diagnosis Date   Aortic atherosclerosis (HCC)    Arthritis    Atrial fibrillation (HCC)    a.) CHA2DS2-VASc = 7; on low dose apixaban. b.) TTE 07/30/2017 --> EF 60-65%; mild MR and TR.   Bilateral shoulder pain    Carotid artery disease (HCC)    a.)  BILATERAL carotid Doppler on 07/30/2017 --> mild to moderate plaque in BILATERAL ICAs compatible with 50-69% stenosis (R  >L).   Chronic anticoagulation    Apixaban   Colon polyp    Constipation    Coronary artery calcification    a.) CT chest on 05/07/2021 --> 3 vessel CAD.   Depression    DOE (dyspnea on exertion)    Falls    GERD (gastroesophageal reflux disease)    Hiatal hernia    a.) CT chest on 05/07/2021 --> large; distended with fluid.   History of hiatal hernia    HLD (hyperlipidemia)    Hypertension    Impaired fasting glucose    Multiple thyroid nodules    Bilateral   Stroke (HCC) 07/29/2017   a.) Acute 1.5 cm infarction within the periventricular deep white matter on the RIGHT adjacent to the posterior body of the lateral ventricle.   Syncope 03/2016   with [facility]   Valvular regurgitation    a.) TTE 08/03/2017 --> mild MR and TR.   Varicose veins of both lower extremities     Assessment/Plan:   1 Day Post-Op Procedure(s) (LRB): T 11 KYPHOPLASTY (N/A) Active Problems:   Status post kyphoplasty  Estimated body mass index is 24.14 kg/m as calculated from the following:   Height as of this encounter: 5\' 2"  (1.575 m).   Weight as of this encounter: 59.9 kg.  Patient doing well.  Back pain seems to be improved.  Will discharge patient back to assisted living facility today.  She will follow-up with [facility] orthopedics in 2 weeks.  [facility], PA-C [facility] 06/06/2021, 9:26 AM

## 2021-06-06 NOTE — Progress Notes (Signed)
This nurse got patient up on side of bed with 2 assist.. patient was able to stand for a 1 min but unable to take steps.

## 2021-06-06 NOTE — Progress Notes (Addendum)
Report called to Judeth Cornfield, LPN at [facility] with opportunity to ask questions.   Transport from facility picked up patient. Prescription and discharge packet sent with patient. Patient aware she should give packet to nurse at facility.

## 2021-06-06 NOTE — TOC Progression Note (Signed)
Transition of Care [facility]) - Progression Note    Patient Details  Name: Debbie Hampton MRN: [id number] Date of Birth: Nov 08, 1931  [facility] CM/SW Contact  Barrie Dunker, RN Phone Number: 06/06/2021, 1:23 PM  Clinical Narrative:     Spoke with the son [name] and spoke to the patient. The plan is to return to [facility], The son is arranging the [name] that brought her to the hospital to transport back to [facility] [place] verified that they will accept the patient back today the Son is on his way to visit prior to DC back       Expected Discharge Plan and Services           Expected Discharge Date: 06/06/21                                     Social Determinants of Health (SDOH) Interventions    Readmission Risk Interventions No flowsheet data found.

## 2021-06-06 NOTE — Plan of Care (Signed)
  Problem: Education: Goal: Knowledge of General Education information will improve Description: Including pain rating scale, medication(s)/side effects and non-pharmacologic comfort measures Outcome: Completed/Met   Problem: Pain Managment: Goal: General experience of comfort will improve Outcome: Completed/Met   Problem: Safety: Goal: Ability to remain free from injury will improve Outcome: Completed/Met   Problem: Skin Integrity: Goal: Risk for impaired skin integrity will decrease Outcome: Completed/Met

## 2021-06-06 NOTE — [place] FL2 (Signed)
[place] MEDICAID FL2 LEVEL OF CARE SCREENING TOOL     IDENTIFICATION  Patient Name: Debbie Hampton Birthdate: November 11, 1931 Sex: female Admission Date (Current Location): 06/05/2021  [facility] and [place] Number:  Chiropodist and Address:  [facility], [address] [place], [place] [address]      Provider Number: [id number]  Attending Physician Name and Address:  Kennedy Bucker, MD  Relative Name and Phone Number:  Alessandra Grout [facility] [phone]    Current Level of Care: Hospital Recommended Level of Care: Nursing Facility Prior Approval Number:    Date Approved/Denied:   PASRR Number: [id number]  Discharge Plan: SNF    Current Diagnoses: Patient Active Problem List   Diagnosis Date Noted   Status post kyphoplasty 06/05/2021   Hypothyroidism 05/07/2021   Type 2 diabetes mellitus without complications (HCC) 05/07/2021   Compression fracture of body of thoracic vertebra (HCC) 05/07/2021   Hypertensive urgency 05/06/2021   Compression fracture of T11 vertebra (HCC) 05/06/2021   History of stroke 12/06/2019   Atrial fibrillation, controlled (HCC) 12/06/2019   Atrial fibrillation (HCC)    Hypertension    Elevated troponin 11/26/2019   Closed Colles' fracture of left radius 10/03/2017   Labile blood pressure    Benign essential HTN    Hypertensive crisis    Hyponatremia    Prediabetes    Hypoalbuminemia due to protein-calorie malnutrition (HCC)    Acute blood loss anemia    Stroke (cerebrum) (HCC) 08/01/2017   Acute CVA (cerebrovascular accident) (HCC) 07/29/2017   Syncope, near 03/31/2016   Gastro-esophageal reflux disease without esophagitis 02/15/2014    Orientation RESPIRATION BLADDER Height & Weight     Self, Time, Situation, Place  Normal Continent, External catheter Weight: 59.9 kg Height:  5\' 2"  (157.5 cm)  BEHAVIORAL SYMPTOMS/MOOD NEUROLOGICAL BOWEL NUTRITION STATUS      Continent Diet (regular)  AMBULATORY STATUS  COMMUNICATION OF NEEDS Skin   Extensive Assist Verbally Normal, Surgical wounds                       Personal Care Assistance Level of Assistance  Bathing, Dressing Bathing Assistance: Limited assistance   Dressing Assistance: Limited assistance     Functional Limitations Info             SPECIAL CARE FACTORS FREQUENCY                       Contractures Contractures Info: Not present    Additional Factors Info  Code Status, Allergies Code Status Info: DNR Allergies Info: Cheese, Mushroom Extract Complex, Penicillins, Verapamil, Aloe, Amlodipine, Cephalexin, Metoprolol, Miconazole, Neosporin (Neomycin-bacitracin Zn-polymyx), Trandolapril-verapamil Hcl Er           Current Medications (06/06/2021):  This is the current hospital active medication list Current Facility-Administered Medications  Medication Dose Route Frequency Provider Last Rate Last Admin   0.9 %  sodium chloride infusion   Intravenous Continuous 06/08/2021, MD 75 mL/hr at 06/06/21 1306 New Bag at 06/06/21 1306   acetaminophen (TYLENOL) tablet 325-650 mg  325-650 mg Oral Q6H PRN 06/08/21, MD       acetaminophen (TYLENOL) tablet 500 mg  500 mg Oral TID Kennedy Bucker, MD   500 mg at 06/06/21 0847   apixaban (ELIQUIS) tablet 2.5 mg  2.5 mg Oral BID 06/08/21, MD   2.5 mg at 06/06/21 0847   atenolol (TENORMIN) tablet 50 mg  50 mg Oral Daily 06/08/21,  MD   50 mg at 06/06/21 0844   calcium carbonate (TUMS - dosed in mg elemental calcium) chewable tablet 400 mg of elemental calcium  2 tablet Oral TID PRN Kennedy Bucker, MD       cholecalciferol (VITAMIN D3) tablet 1,000 Units  1,000 Units Oral Daily Kennedy Bucker, MD   1,000 Units at 06/06/21 0844   citalopram (CELEXA) tablet 20 mg  20 mg Oral Daily Kennedy Bucker, MD   20 mg at 06/06/21 0843   famotidine (PEPCID) tablet 40 mg  40 mg Oral QHS Kennedy Bucker, MD   40 mg at 06/05/21 2202   Fluocinolone Acetonide 0.01 % OIL 2 drop  2 drop Both  EARS Weekly Kennedy Bucker, MD       fluticasone (FLONASE) 50 MCG/ACT nasal spray 1 spray  1 spray Each Nare Daily PRN Kennedy Bucker, MD       furosemide (LASIX) tablet 20 mg  20 mg Oral Daily PRN Kennedy Bucker, MD       guaiFENesin (MUCINEX) 12 hr tablet 600 mg  600 mg Oral BID PRN Kennedy Bucker, MD       HYDROcodone-acetaminophen (NORCO) 7.5-325 MG per tablet 1-2 tablet  1-2 tablet Oral Q4H PRN Kennedy Bucker, MD       HYDROcodone-acetaminophen (NORCO/VICODIN) 5-325 MG per tablet 1-2 tablet  1-2 tablet Oral Q4H PRN Kennedy Bucker, MD       iron polysaccharides (NIFEREX) capsule 150 mg  150 mg Oral Daily Kennedy Bucker, MD   150 mg at 06/06/21 0843   lisinopril (ZESTRIL) tablet 10 mg  10 mg Oral Daily Kennedy Bucker, MD   10 mg at 06/06/21 0844   methocarbamol (ROBAXIN) tablet 500 mg  500 mg Oral Q6H PRN Kennedy Bucker, MD       Or   methocarbamol (ROBAXIN) 500 mg in dextrose 5 % 50 mL IVPB  500 mg Intravenous Q6H PRN Kennedy Bucker, MD       morphine 2 MG/ML injection 0.5-1 mg  0.5-1 mg Intravenous Q2H PRN Kennedy Bucker, MD       ondansetron [facility]) tablet 4 mg  4 mg Oral Q6H PRN Kennedy Bucker, MD       Or   ondansetron [facility]) injection 4 mg  4 mg Intravenous Q6H PRN Kennedy Bucker, MD       oxymetazoline (AFRIN) 0.05 % nasal spray 1 spray  1 spray Each Nare TID PRN Kennedy Bucker, MD       pantoprazole (PROTONIX) EC tablet 40 mg  40 mg Oral Daily Kennedy Bucker, MD   40 mg at 06/06/21 0847   polyethylene glycol (MIRALAX / GLYCOLAX) packet 17 g  17 g Oral Daily Kennedy Bucker, MD   17 g at 06/06/21 9562   polyvinyl alcohol (LIQUIFILM TEARS) 1.4 % ophthalmic solution 1 drop  1 drop Both Eyes TID PRN Kennedy Bucker, MD       rosuvastatin (CRESTOR) tablet 20 mg  20 mg Oral QHS Kennedy Bucker, MD   20 mg at 06/05/21 2201   sodium chloride (OCEAN) 0.65 % nasal spray 1 spray  1 spray Each Nare TID PRN Kennedy Bucker, MD       tiZANidine (ZANAFLEX) tablet 2 mg  2 mg Oral PRN Kennedy Bucker, MD          Discharge Medications: Please see discharge summary for a list of discharge medications.  Relevant Imaging Results:  Relevant Lab Results:   Additional Information SSN:[id number]  Barrie Dunker, RN

## 2021-06-07 LAB — SURGICAL PATHOLOGY

## 2021-06-08 ENCOUNTER — Encounter (HOSPITAL_COMMUNITY): Payer: Self-pay | Admitting: Radiology

## 2021-07-07 ENCOUNTER — Other Ambulatory Visit: Payer: Self-pay

## 2021-07-07 ENCOUNTER — Encounter (HOSPITAL_COMMUNITY): Payer: Self-pay

## 2021-07-07 ENCOUNTER — Inpatient Hospital Stay (HOSPITAL_COMMUNITY): Payer: Medicare Other

## 2021-07-07 ENCOUNTER — Inpatient Hospital Stay (HOSPITAL_COMMUNITY)
Admission: EM | Admit: 2021-07-07 | Discharge: 2021-07-16 | DRG: 329 | Disposition: A | Discharge status: Hospice | Payer: Medicare Other | Source: Skilled Nursing Facility | Attending: Family Medicine | Admitting: Family Medicine

## 2021-07-07 ENCOUNTER — Emergency Department (HOSPITAL_COMMUNITY): Payer: Medicare Other

## 2021-07-07 DIAGNOSIS — B9620 Unspecified Escherichia coli [E. coli] as the cause of diseases classified elsewhere: Secondary | ICD-10-CM | POA: Diagnosis present

## 2021-07-07 DIAGNOSIS — Z4659 Encounter for fitting and adjustment of other gastrointestinal appliance and device: Secondary | ICD-10-CM

## 2021-07-07 DIAGNOSIS — J90 Pleural effusion, not elsewhere classified: Secondary | ICD-10-CM | POA: Diagnosis present

## 2021-07-07 DIAGNOSIS — E871 Hypo-osmolality and hyponatremia: Secondary | ICD-10-CM | POA: Diagnosis present

## 2021-07-07 DIAGNOSIS — N179 Acute kidney failure, unspecified: Secondary | ICD-10-CM

## 2021-07-07 DIAGNOSIS — K429 Umbilical hernia without obstruction or gangrene: Secondary | ICD-10-CM | POA: Diagnosis present

## 2021-07-07 DIAGNOSIS — F0390 Unspecified dementia without behavioral disturbance: Secondary | ICD-10-CM | POA: Diagnosis present

## 2021-07-07 DIAGNOSIS — R748 Abnormal levels of other serum enzymes: Secondary | ICD-10-CM | POA: Diagnosis present

## 2021-07-07 DIAGNOSIS — I700 Atherosclerosis of aorta: Secondary | ICD-10-CM | POA: Diagnosis present

## 2021-07-07 DIAGNOSIS — Z803 Family history of malignant neoplasm of breast: Secondary | ICD-10-CM

## 2021-07-07 DIAGNOSIS — E039 Hypothyroidism, unspecified: Secondary | ICD-10-CM | POA: Diagnosis present

## 2021-07-07 DIAGNOSIS — K5669 Other partial intestinal obstruction: Secondary | ICD-10-CM | POA: Diagnosis present

## 2021-07-07 DIAGNOSIS — E1165 Type 2 diabetes mellitus with hyperglycemia: Secondary | ICD-10-CM | POA: Diagnosis present

## 2021-07-07 DIAGNOSIS — R29810 Facial weakness: Secondary | ICD-10-CM | POA: Diagnosis not present

## 2021-07-07 DIAGNOSIS — K567 Ileus, unspecified: Secondary | ICD-10-CM | POA: Diagnosis not present

## 2021-07-07 DIAGNOSIS — K9189 Other postprocedural complications and disorders of digestive system: Secondary | ICD-10-CM | POA: Diagnosis not present

## 2021-07-07 DIAGNOSIS — Z8673 Personal history of transient ischemic attack (TIA), and cerebral infarction without residual deficits: Secondary | ICD-10-CM

## 2021-07-07 DIAGNOSIS — I482 Chronic atrial fibrillation, unspecified: Secondary | ICD-10-CM | POA: Diagnosis present

## 2021-07-07 DIAGNOSIS — H9190 Unspecified hearing loss, unspecified ear: Secondary | ICD-10-CM | POA: Diagnosis present

## 2021-07-07 DIAGNOSIS — Z79899 Other long term (current) drug therapy: Secondary | ICD-10-CM

## 2021-07-07 DIAGNOSIS — E876 Hypokalemia: Secondary | ICD-10-CM | POA: Diagnosis not present

## 2021-07-07 DIAGNOSIS — I4891 Unspecified atrial fibrillation: Secondary | ICD-10-CM | POA: Diagnosis not present

## 2021-07-07 DIAGNOSIS — I959 Hypotension, unspecified: Secondary | ICD-10-CM | POA: Diagnosis not present

## 2021-07-07 DIAGNOSIS — I272 Pulmonary hypertension, unspecified: Secondary | ICD-10-CM | POA: Diagnosis present

## 2021-07-07 DIAGNOSIS — Z20822 Contact with and (suspected) exposure to covid-19: Secondary | ICD-10-CM | POA: Diagnosis present

## 2021-07-07 DIAGNOSIS — M1990 Unspecified osteoarthritis, unspecified site: Secondary | ICD-10-CM | POA: Diagnosis present

## 2021-07-07 DIAGNOSIS — N170 Acute kidney failure with tubular necrosis: Secondary | ICD-10-CM | POA: Diagnosis not present

## 2021-07-07 DIAGNOSIS — R4701 Aphasia: Secondary | ICD-10-CM | POA: Diagnosis not present

## 2021-07-07 DIAGNOSIS — F419 Anxiety disorder, unspecified: Secondary | ICD-10-CM | POA: Diagnosis present

## 2021-07-07 DIAGNOSIS — A419 Sepsis, unspecified organism: Secondary | ICD-10-CM | POA: Diagnosis not present

## 2021-07-07 DIAGNOSIS — R100 Acute abdomen: Secondary | ICD-10-CM | POA: Diagnosis not present

## 2021-07-07 DIAGNOSIS — I4821 Permanent atrial fibrillation: Secondary | ICD-10-CM | POA: Diagnosis not present

## 2021-07-07 DIAGNOSIS — Z7189 Other specified counseling: Secondary | ICD-10-CM

## 2021-07-07 DIAGNOSIS — L89812 Pressure ulcer of head, stage 2: Secondary | ICD-10-CM | POA: Diagnosis not present

## 2021-07-07 DIAGNOSIS — Z515 Encounter for palliative care: Secondary | ICD-10-CM

## 2021-07-07 DIAGNOSIS — G9341 Metabolic encephalopathy: Secondary | ICD-10-CM | POA: Diagnosis not present

## 2021-07-07 DIAGNOSIS — G8194 Hemiplegia, unspecified affecting left nondominant side: Secondary | ICD-10-CM | POA: Diagnosis not present

## 2021-07-07 DIAGNOSIS — I2510 Atherosclerotic heart disease of native coronary artery without angina pectoris: Secondary | ICD-10-CM | POA: Diagnosis present

## 2021-07-07 DIAGNOSIS — R109 Unspecified abdominal pain: Secondary | ICD-10-CM

## 2021-07-07 DIAGNOSIS — Z7901 Long term (current) use of anticoagulants: Secondary | ICD-10-CM

## 2021-07-07 DIAGNOSIS — R34 Anuria and oliguria: Secondary | ICD-10-CM | POA: Diagnosis not present

## 2021-07-07 DIAGNOSIS — R1110 Vomiting, unspecified: Secondary | ICD-10-CM

## 2021-07-07 DIAGNOSIS — R809 Proteinuria, unspecified: Secondary | ICD-10-CM | POA: Diagnosis present

## 2021-07-07 DIAGNOSIS — I6523 Occlusion and stenosis of bilateral carotid arteries: Secondary | ICD-10-CM | POA: Diagnosis present

## 2021-07-07 DIAGNOSIS — K55059 Acute (reversible) ischemia of intestine, part and extent unspecified: Secondary | ICD-10-CM | POA: Diagnosis not present

## 2021-07-07 DIAGNOSIS — Z880 Allergy status to penicillin: Secondary | ICD-10-CM

## 2021-07-07 DIAGNOSIS — K650 Generalized (acute) peritonitis: Secondary | ICD-10-CM | POA: Diagnosis not present

## 2021-07-07 DIAGNOSIS — R55 Syncope and collapse: Secondary | ICD-10-CM | POA: Diagnosis present

## 2021-07-07 DIAGNOSIS — K55019 Acute (reversible) ischemia of small intestine, extent unspecified: Secondary | ICD-10-CM | POA: Diagnosis present

## 2021-07-07 DIAGNOSIS — K625 Hemorrhage of anus and rectum: Secondary | ICD-10-CM

## 2021-07-07 DIAGNOSIS — N390 Urinary tract infection, site not specified: Secondary | ICD-10-CM | POA: Diagnosis not present

## 2021-07-07 DIAGNOSIS — M4854XA Collapsed vertebra, not elsewhere classified, thoracic region, initial encounter for fracture: Secondary | ICD-10-CM | POA: Diagnosis present

## 2021-07-07 DIAGNOSIS — N3000 Acute cystitis without hematuria: Secondary | ICD-10-CM | POA: Diagnosis not present

## 2021-07-07 DIAGNOSIS — E785 Hyperlipidemia, unspecified: Secondary | ICD-10-CM

## 2021-07-07 DIAGNOSIS — E861 Hypovolemia: Secondary | ICD-10-CM | POA: Diagnosis not present

## 2021-07-07 DIAGNOSIS — I701 Atherosclerosis of renal artery: Secondary | ICD-10-CM | POA: Diagnosis present

## 2021-07-07 DIAGNOSIS — L8990 Pressure ulcer of unspecified site, unspecified stage: Secondary | ICD-10-CM | POA: Insufficient documentation

## 2021-07-07 DIAGNOSIS — R6520 Severe sepsis without septic shock: Secondary | ICD-10-CM | POA: Diagnosis not present

## 2021-07-07 DIAGNOSIS — K5900 Constipation, unspecified: Secondary | ICD-10-CM | POA: Diagnosis present

## 2021-07-07 DIAGNOSIS — K449 Diaphragmatic hernia without obstruction or gangrene: Secondary | ICD-10-CM | POA: Diagnosis present

## 2021-07-07 DIAGNOSIS — K810 Acute cholecystitis: Secondary | ICD-10-CM | POA: Diagnosis not present

## 2021-07-07 DIAGNOSIS — E042 Nontoxic multinodular goiter: Secondary | ICD-10-CM | POA: Diagnosis present

## 2021-07-07 DIAGNOSIS — I63511 Cerebral infarction due to unspecified occlusion or stenosis of right middle cerebral artery: Secondary | ICD-10-CM | POA: Diagnosis not present

## 2021-07-07 DIAGNOSIS — K559 Vascular disorder of intestine, unspecified: Secondary | ICD-10-CM | POA: Diagnosis not present

## 2021-07-07 DIAGNOSIS — I10 Essential (primary) hypertension: Secondary | ICD-10-CM | POA: Diagnosis present

## 2021-07-07 DIAGNOSIS — D62 Acute posthemorrhagic anemia: Secondary | ICD-10-CM | POA: Diagnosis not present

## 2021-07-07 DIAGNOSIS — H538 Other visual disturbances: Secondary | ICD-10-CM | POA: Diagnosis not present

## 2021-07-07 DIAGNOSIS — Z91010 Allergy to peanuts: Secondary | ICD-10-CM

## 2021-07-07 DIAGNOSIS — Z66 Do not resuscitate: Secondary | ICD-10-CM | POA: Diagnosis present

## 2021-07-07 DIAGNOSIS — K55029 Acute infarction of small intestine, extent unspecified: Secondary | ICD-10-CM | POA: Diagnosis not present

## 2021-07-07 DIAGNOSIS — K5720 Diverticulitis of large intestine with perforation and abscess without bleeding: Secondary | ICD-10-CM | POA: Diagnosis present

## 2021-07-07 DIAGNOSIS — Z8719 Personal history of other diseases of the digestive system: Secondary | ICD-10-CM

## 2021-07-07 DIAGNOSIS — K648 Other hemorrhoids: Secondary | ICD-10-CM | POA: Diagnosis present

## 2021-07-07 DIAGNOSIS — S22080A Wedge compression fracture of T11-T12 vertebra, initial encounter for closed fracture: Secondary | ICD-10-CM | POA: Diagnosis present

## 2021-07-07 DIAGNOSIS — Z888 Allergy status to other drugs, medicaments and biological substances status: Secondary | ICD-10-CM

## 2021-07-07 DIAGNOSIS — K828 Other specified diseases of gallbladder: Secondary | ICD-10-CM | POA: Diagnosis present

## 2021-07-07 DIAGNOSIS — K219 Gastro-esophageal reflux disease without esophagitis: Secondary | ICD-10-CM | POA: Diagnosis present

## 2021-07-07 DIAGNOSIS — Z5331 Laparoscopic surgical procedure converted to open procedure: Secondary | ICD-10-CM

## 2021-07-07 DIAGNOSIS — Z9181 History of falling: Secondary | ICD-10-CM

## 2021-07-07 DIAGNOSIS — F32A Depression, unspecified: Secondary | ICD-10-CM | POA: Diagnosis present

## 2021-07-07 DIAGNOSIS — Z87891 Personal history of nicotine dependence: Secondary | ICD-10-CM

## 2021-07-07 DIAGNOSIS — R531 Weakness: Secondary | ICD-10-CM | POA: Diagnosis not present

## 2021-07-07 DIAGNOSIS — R0600 Dyspnea, unspecified: Secondary | ICD-10-CM

## 2021-07-07 DIAGNOSIS — Z8249 Family history of ischemic heart disease and other diseases of the circulatory system: Secondary | ICD-10-CM

## 2021-07-07 DIAGNOSIS — Z881 Allergy status to other antibiotic agents status: Secondary | ICD-10-CM

## 2021-07-07 DIAGNOSIS — E860 Dehydration: Secondary | ICD-10-CM | POA: Diagnosis present

## 2021-07-07 LAB — CBC WITH DIFFERENTIAL/PLATELET
Band Neutrophils: 0 %
Basophils Relative: 0 %
Blasts: NONE SEEN %
Eosinophils Relative: 0 %
HCT: 44 % (ref 36.0–46.0)
Hemoglobin: 14.8 g/dL (ref 12.0–15.0)
Lymphocytes Relative: 3 %
MCH: 29.7 pg (ref 26.0–34.0)
MCHC: 33.6 g/dL (ref 30.0–36.0)
MCV: 88.2 fL (ref 80.0–100.0)
Metamyelocytes Relative: NONE SEEN %
Monocytes Relative: 3 %
Myelocytes: NONE SEEN %
Neutrophils Relative %: 94 %
Platelets: 220 10*3/uL (ref 150–400)
Promyelocytes Relative: NONE SEEN %
RBC Morphology: NORMAL
RBC: 4.99 MIL/uL (ref 3.87–5.11)
RDW: 13.1 % (ref 11.5–15.5)
WBC Morphology: NORMAL
WBC: 17.6 10*3/uL — ABNORMAL HIGH (ref 4.0–10.5)
nRBC: 0 % (ref 0.0–0.2)
nRBC: NONE SEEN /100 WBC

## 2021-07-07 LAB — PROTIME-INR
INR: 1 (ref 0.8–1.2)
Prothrombin Time: 12.8 seconds (ref 11.4–15.2)

## 2021-07-07 LAB — TYPE AND SCREEN
ABO/RH(D): B POS
Antibody Screen: NEGATIVE

## 2021-07-07 LAB — COMPREHENSIVE METABOLIC PANEL
ALT: 25 U/L (ref 0–44)
AST: 52 U/L — ABNORMAL HIGH (ref 15–41)
Albumin: 4 g/dL (ref 3.5–5.0)
Alkaline Phosphatase: 105 U/L (ref 38–126)
Anion gap: 9 (ref 5–15)
BUN: 24 mg/dL — ABNORMAL HIGH (ref 8–23)
CO2: 24 mmol/L (ref 22–32)
Calcium: 9.4 mg/dL (ref 8.9–10.3)
Chloride: 97 mmol/L — ABNORMAL LOW (ref 98–111)
Creatinine, Ser: 0.63 mg/dL (ref 0.44–1.00)
GFR, Estimated: >60 mL/min (ref >60)
Glucose, Bld: 186 mg/dL — ABNORMAL HIGH (ref 70–99)
Potassium: 4.9 mmol/L (ref 3.5–5.1)
Sodium: 130 mmol/L — ABNORMAL LOW (ref 135–145)
Total Bilirubin: 1 mg/dL (ref 0.3–1.2)
Total Protein: 7 g/dL (ref 6.5–8.1)

## 2021-07-07 LAB — URINALYSIS, ROUTINE W REFLEX MICROSCOPIC
Bilirubin Urine: NEGATIVE
Glucose, UA: NEGATIVE mg/dL
Hgb urine dipstick: NEGATIVE
Ketones, ur: NEGATIVE mg/dL
Nitrite: NEGATIVE
Protein, ur: 100 mg/dL — AB
Specific Gravity, Urine: 1.018 (ref 1.005–1.030)
WBC, UA: >50 WBC/hpf — ABNORMAL HIGH (ref 0–5)
pH: 5 (ref 5.0–8.0)

## 2021-07-07 LAB — LIPASE, BLOOD: Lipase: 68 U/L — ABNORMAL HIGH (ref 11–51)

## 2021-07-07 LAB — POC OCCULT BLOOD, ED: Fecal Occult Bld: POSITIVE — AB

## 2021-07-07 LAB — ABO/RH: ABO/RH(D): B POS

## 2021-07-07 LAB — HEMOGLOBIN: Hemoglobin: 13 g/dL (ref 12.0–15.0)

## 2021-07-07 LAB — RESP PANEL BY RT-PCR (FLU A&B, COVID) ARPGX2
Influenza A by PCR: NEGATIVE
Influenza B by PCR: NEGATIVE
SARS Coronavirus 2 by RT PCR: NEGATIVE

## 2021-07-07 LAB — GLUCOSE, CAPILLARY: Glucose-Capillary: 193 mg/dL — ABNORMAL HIGH (ref 70–99)

## 2021-07-07 LAB — CBG MONITORING, ED: Glucose-Capillary: 146 mg/dL — ABNORMAL HIGH (ref 70–99)

## 2021-07-07 LAB — TSH: TSH: 0.596 u[IU]/mL (ref 0.350–4.500)

## 2021-07-07 MED ORDER — SODIUM CHLORIDE 0.9 % IV BOLUS
500.0000 mL | Freq: Once | INTRAVENOUS | Status: AC
  Administered 2021-07-07: 500 mL via INTRAVENOUS

## 2021-07-07 MED ORDER — CIPROFLOXACIN IN D5W 400 MG/200ML IV SOLN: 400.0000 mg | Freq: Once | INTRAVENOUS | Status: DC

## 2021-07-07 MED ORDER — HYDRALAZINE HCL 20 MG/ML IJ SOLN
10.0000 mg | Freq: Once | INTRAMUSCULAR | Status: AC
  Administered 2021-07-07: 10 mg via INTRAVENOUS
  Filled 2021-07-07: qty 1

## 2021-07-07 MED ORDER — ATENOLOL 25 MG PO TABS
50.0000 mg | ORAL_TABLET | Freq: Two times a day (BID) | ORAL | Status: DC
  Administered 2021-07-07 – 2021-07-08 (×3): 50 mg via ORAL
  Filled 2021-07-07 (×2): qty 2
  Filled 2021-07-07: qty 1
  Filled 2021-07-07: qty 2

## 2021-07-07 MED ORDER — DICLOFENAC SODIUM 1 % EX GEL
2.0000 g | Freq: Four times a day (QID) | CUTANEOUS | Status: DC
  Administered 2021-07-08 – 2021-07-16 (×28): 2 g via TOPICAL
  Filled 2021-07-07 (×3): qty 100

## 2021-07-07 MED ORDER — MORPHINE SULFATE (PF) 2 MG/ML IV SOLN
2.0000 mg | INTRAVENOUS | Status: DC | PRN
Start: 2021-07-07 — End: 2021-07-16
  Administered 2021-07-07 – 2021-07-14 (×26): 2 mg via INTRAVENOUS
  Filled 2021-07-07 (×27): qty 1

## 2021-07-07 MED ORDER — ONDANSETRON HCL 4 MG PO TABS: 4.0000 mg | ORAL_TABLET | Freq: Four times a day (QID) | ORAL | Status: DC | PRN

## 2021-07-07 MED ORDER — ATENOLOL 50 MG PO TABS
50.0000 mg | ORAL_TABLET | Freq: Once | ORAL | Status: AC
  Administered 2021-07-07: 50 mg via ORAL
  Filled 2021-07-07: qty 1

## 2021-07-07 MED ORDER — ONDANSETRON HCL 4 MG/2ML IJ SOLN
4.0000 mg | Freq: Once | INTRAMUSCULAR | Status: AC
  Administered 2021-07-07: 4 mg via INTRAVENOUS
  Filled 2021-07-07: qty 2

## 2021-07-07 MED ORDER — PANTOPRAZOLE SODIUM 40 MG IV SOLR
40.0000 mg | Freq: Every day | INTRAVENOUS | Status: DC
  Administered 2021-07-07 – 2021-07-13 (×7): 40 mg via INTRAVENOUS
  Filled 2021-07-07 (×7): qty 40

## 2021-07-07 MED ORDER — ONDANSETRON HCL 4 MG/2ML IJ SOLN
4.0000 mg | Freq: Four times a day (QID) | INTRAMUSCULAR | Status: DC | PRN
  Administered 2021-07-07 – 2021-07-15 (×5): 4 mg via INTRAVENOUS
  Filled 2021-07-07 (×6): qty 2

## 2021-07-07 MED ORDER — ATENOLOL 25 MG PO TABS
25.0000 mg | ORAL_TABLET | Freq: Once | ORAL | Status: AC
  Administered 2021-07-07: 25 mg via ORAL
  Filled 2021-07-07 (×2): qty 1

## 2021-07-07 MED ORDER — LISINOPRIL 10 MG PO TABS
10.0000 mg | ORAL_TABLET | Freq: Every day | ORAL | Status: DC
  Administered 2021-07-08: 10 mg via ORAL
  Filled 2021-07-07: qty 1

## 2021-07-07 MED ORDER — LISINOPRIL 10 MG PO TABS: 10.0000 mg | ORAL_TABLET | Freq: Every day | ORAL | Status: DC

## 2021-07-07 MED ORDER — IOHEXOL 350 MG/ML SOLN
100.0000 mL | Freq: Once | INTRAVENOUS | Status: AC | PRN
  Administered 2021-07-07: 100 mL via INTRAVENOUS

## 2021-07-07 MED ORDER — ACETAMINOPHEN 650 MG RE SUPP
650.0000 mg | Freq: Four times a day (QID) | RECTAL | Status: DC | PRN
  Administered 2021-07-12: 650 mg via RECTAL
  Filled 2021-07-07: qty 1

## 2021-07-07 MED ORDER — LACTATED RINGERS IV SOLN: INTRAVENOUS | Status: DC

## 2021-07-07 MED ORDER — METRONIDAZOLE 500 MG/100ML IV SOLN
500.0000 mg | Freq: Once | INTRAVENOUS | Status: AC
  Administered 2021-07-07: 500 mg via INTRAVENOUS
  Filled 2021-07-07: qty 100

## 2021-07-07 MED ORDER — CITALOPRAM HYDROBROMIDE 20 MG PO TABS
20.0000 mg | ORAL_TABLET | Freq: Every day | ORAL | Status: DC
  Administered 2021-07-07 – 2021-07-08 (×2): 20 mg via ORAL
  Filled 2021-07-07: qty 2
  Filled 2021-07-07: qty 1

## 2021-07-07 MED ORDER — LISINOPRIL 10 MG PO TABS
10.0000 mg | ORAL_TABLET | Freq: Once | ORAL | Status: AC
  Administered 2021-07-07: 10 mg via ORAL
  Filled 2021-07-07: qty 1

## 2021-07-07 MED ORDER — CIPROFLOXACIN IN D5W 400 MG/200ML IV SOLN
400.0000 mg | Freq: Two times a day (BID) | INTRAVENOUS | Status: DC
  Administered 2021-07-07 – 2021-07-08 (×3): 400 mg via INTRAVENOUS
  Filled 2021-07-07 (×3): qty 200

## 2021-07-07 MED ORDER — ACETAMINOPHEN 325 MG PO TABS
650.0000 mg | ORAL_TABLET | Freq: Four times a day (QID) | ORAL | Status: DC | PRN
  Filled 2021-07-07: qty 2

## 2021-07-07 NOTE — H&P (Signed)
History and Physical    Debbie Hampton ZOX:[id number] DOB: March 07, 1932 DOA: 07/07/2021  PCP: Kandyce Rud, MD  Patient coming from: SNF.  I have personally briefly reviewed patient's old medical records in [facility]  Chief Complaint: Rectal bleed, nausea and vomiting.  HPI: Debbie Hampton is a 87 y.o. female with medical history significant of aortic atherosclerosis, chronic atrial fibrillation on apixaban, bilateral carotid artery disease, coronary artery disease, mild MR and TR, history of syncope, history of other nonhemorrhagic CVA, history of renal artery stenosis, history of falls, GERD, hiatal hernia, colon polyps, constipation, type II DM, thyroid nodules, depression, osteoarthritis, hyperlipidemia who was sent from her skilled nursing facility due to rectal bleeding, nausea and vomiting since yesterday.  She is able to tell me that her symptoms started last night but was unable to elaborate.  She still has some abdominal pain, but denied headache, chest or back pain at this moment.  ED Course: Initial vital signs were temperature 97.6 F, pulse 99, respirations 17, BP 200/100 mmHg and O2 sat 92% on room air.  Patient received 500 mL of NS bolus, atenolol 50 mg p.o. x1, lisinopril 10 mg p.o. x1, Zofran 4 mg IVPB x1 ciprofloxacin 400 mg IVPB and metronidazole 500 mg IVPB.  Lab work: Her urinalysis was cloudy, showed proteinuria 100 mg/dL, large leukocyte esterase, 11-20 RBC, more than 50 WBC and many bacteria along with WBC clumps, mucus hyaline casts and amorphous crystals on microscopic examination.  Fecal occult blood was positive.  CBC showed a white count of 17.6.Her lipase was 68 units/L.  CMP showed a sodium of 130 with chloride 97 mmol/L.  Glucose 186, BUN 24 creatinine 0.63 mg/dL.  AST 52 units/L the rest of the hepatic functions were normal.  Imaging: CTA GI bleed did not show any evidence of active GI bleeding.  There was aortic atherosclerosis.  Mild to moderate stenosis  of the origin of the main left renal artery (also seen on 10/2010 CTA abdomen/pelvis).  Distended and diffusely edematous gallbladder.  Cardiomegaly with biatrial enlargement with small layering pleural effusions.  Mild sigmoid colonic diverticulosis.  T11 compression fracture status post cement augmentation.  Please see images and full radiology report for further details.  Review of Systems: As per HPI otherwise all other systems reviewed and are negative.  Past Medical History:  Diagnosis Date   Aortic atherosclerosis (HCC)    Arthritis    Atrial fibrillation (HCC)    a.) CHA2DS2-VASc = 7; on low dose apixaban. b.) TTE 07/30/2017 --> EF 60-65%; mild MR and TR.   Bilateral shoulder pain    Carotid artery disease (HCC)    a.)  BILATERAL carotid Doppler on 07/30/2017 --> mild to moderate plaque in BILATERAL ICAs compatible with 50-69% stenosis (R >L).   Chronic anticoagulation    Apixaban   Colon polyp    Constipation    Coronary artery calcification    a.) CT chest on 05/07/2021 --> 3 vessel CAD.   Depression    DOE (dyspnea on exertion)    Falls    GERD (gastroesophageal reflux disease)    Hiatal hernia    a.) CT chest on 05/07/2021 --> large; distended with fluid.   History of hiatal hernia    HLD (hyperlipidemia)    Hypertension    Impaired fasting glucose    Multiple thyroid nodules    Bilateral   Other non-hemorrhagic stroke (HCC) 07/29/2017   a.) Acute 1.5 cm infarction within the periventricular deep white matter on the  RIGHT adjacent to the posterior body of the lateral ventricle.   Syncope 03/2016   with CHI   Valvular regurgitation    a.) TTE 08/03/2017 --> mild MR and TR.   Varicose veins of both lower extremities    Past Surgical History:  Procedure Laterality Date   COLONOSCOPY     KYPHOPLASTY N/A 06/05/2021   Procedure: T 11 KYPHOPLASTY;  Surgeon: Kennedy Bucker, MD;  Location: [facility] ORS;  Service: Orthopedics;  Laterality: N/A;   TONSILLECTOMY     TUMOR  REMOVAL     ovaries   Social History  reports that she quit smoking about 37 years ago. Her smoking use included cigarettes. She has never used smokeless tobacco. She reports that she does not drink alcohol and does not use drugs.  Allergies  Allergen Reactions   Cheese     GOAT cheese   Mushroom Extract Complex    Penicillins     Has patient had a PCN reaction causing immediate rash, facial/tongue/throat swelling, SOB or lightheadedness with hypotension: Yes Has patient had a PCN reaction causing severe rash involving mucus membranes or skin necrosis: No Has patient had a PCN reaction that required hospitalization No Has patient had a PCN reaction occurring within the last 10 years: Yes If all of the above answers are "NO", then may proceed with Cephalosporin use.    Verapamil    Aloe Hives and Rash   Amlodipine Hives, Rash and Hypertension   Cephalexin Rash   Metoprolol Hives, Palpitations and Hypertension    Succinate   Miconazole Swelling and Rash   Neosporin [Neomycin-Bacitracin Zn-Polymyx] Rash   Trandolapril-Verapamil Hcl Er Rash   Family History  Problem Relation Age of Onset   Hypertension Other    Breast cancer Sister    Kidney cancer Neg Hx    Kidney disease Neg Hx    Prostate cancer Neg Hx    Prior to Admission medications   Medication Sig Start Date End Date Taking? Authorizing Provider  atenolol (TENORMIN) 50 MG tablet Take 1 tablet (50 mg total) by mouth 2 (two) times daily. 05/09/21  Yes Enedina Finner, MD  calcium carbonate (TUMS EX) 750 MG chewable tablet Chew 2 tablets by mouth 3 (three) times daily as needed for heartburn.   Yes [provider]  cholecalciferol (VITAMIN D3) 25 MCG (1000 UNIT) tablet Take 2,000 Units by mouth daily.   Yes [provider]  citalopram (CELEXA) 20 MG tablet Take 20 mg by mouth daily.    Yes [provider]  diclofenac Sodium (VOLTAREN) 1 % GEL Apply 2 g topically 4 (four) times daily. 04/30/21  Yes  [provider]  Esomeprazole Magnesium 20 MG TBEC Take 20 mg by mouth daily at 12 noon.   Yes [provider]  Fluocinolone Acetonide 0.01 % OIL Place 2 drops into both ears once a week. Wednesdays 05/02/21  Yes [provider]  fluticasone (FLONASE) 50 MCG/ACT nasal spray Place 1 spray into both nostrils daily as needed for allergies or rhinitis.   Yes [provider]  furosemide (LASIX) 20 MG tablet Take 1 tablet (20 mg total) by mouth daily as needed. For SOB or leg edema 05/09/21  Yes Enedina Finner, MD  guaiFENesin (MUCINEX) 600 MG 12 hr tablet Take 600 mg by mouth 2 (two) times daily as needed (congestion).   Yes [provider]  HYDROcodone-acetaminophen (NORCO/VICODIN) 5-325 MG tablet Take 1-2 tablets by mouth every 4 (four) hours as needed for moderate pain (pain  score 4-6). 06/06/21  Yes Evon Slack, PA-C  iron polysaccharides (NIFEREX) 150 MG capsule Take 150 mg by mouth daily.   Yes [provider]  lidocaine (LIDODERM) 5 % Place 1 patch onto the skin daily. Remove & Discard patch within 12 hours or as directed by MD 05/10/21  Yes Enedina Finner, MD  lisinopril (ZESTRIL) 10 MG tablet Take 10 mg by mouth daily. 11/21/20  Yes [provider]  liver oil-zinc oxide (DESITIN) 40 % ointment Apply 1 application topically in the morning, at noon, and at bedtime. Apply to buttocks   Yes [provider]  Multiple Vitamins-Minerals (CERTAVITE/ANTIOXIDANTS) TABS Take 1 tablet by mouth daily.   Yes [provider]  omega-3 acid ethyl esters (LOVAZA) 1 g capsule Take 1 g by mouth 2 (two) times daily.   Yes [provider]  polyethylene glycol (MIRALAX / GLYCOLAX) packet Take 17 g by mouth daily. 08/14/17  Yes Love, Evlyn Kanner, PA-C  rosuvastatin (CRESTOR) 20 MG tablet Take 20 mg by mouth at bedtime.   Yes [provider]  sodium chloride (OCEAN) 0.65 % SOLN nasal spray Place 1 spray into both nostrils 3 (three)  times daily as needed for congestion.    Yes [provider]  tiZANidine (ZANAFLEX) 2 MG tablet Take 2 mg by mouth as needed (twice daily as needed). 04/26/21  Yes [provider]  famotidine (PEPCID) 40 MG tablet Take 40 mg by mouth at bedtime. Patient not taking: Reported on 07/07/2021 06/27/20   [provider]  mometasone (ELOCON) 0.1 % lotion Apply 4 drops topically at bedtime as needed for itching or dry skin. Place into each ear. Patient not taking: Reported on 07/07/2021 05/12/20   [provider]  oxymetazoline (AFRIN) 0.05 % nasal spray Place 1 spray into both nostrils 3 (three) times daily as needed for congestion. Patient not taking: Reported on 07/07/2021    [provider]  Polyethyl Glycol-Propyl Glycol 0.4-0.3 % SOLN Place 1 drop into both eyes 3 (three) times daily as needed (dry eyes). Patient not taking: Reported on 07/07/2021    [provider]  Sunscreens (AVEENO DAILY MOISTURIZER) LOTN Apply topically 2 (two) times daily as needed (for dry skin). Patient not taking: Reported on 07/07/2021    [provider]    Physical Exam: Vitals:   07/07/21 0400 07/07/21 0615 07/07/21 0715 07/07/21 0728  BP: (!) 169/90 (!) 191/134 (!) 168/105 (!) 190/134  Pulse: (!) 103 (!) 116 (!) 105 96  Resp: Temp:      TempSrc:      SpO2: 92% 93% 95% 93%  Weight:      Height:        Constitutional: NAD, calm, comfortable Eyes: PERRL, lids and conjunctivae normal ENMT: Mucous membranes are dry.  Posterior pharynx clear of any exudate or lesions. Neck: normal, supple, no masses, no thyromegaly Respiratory: clear to auscultation bilaterally, no wheezing, no crackles. Normal respiratory effort. No accessory muscle use.  Cardiovascular: Regular rate and rhythm, no murmurs / rubs / gallops. No extremity edema. 2+ pedal pulses. No carotid bruits.  Abdomen: no tenderness, no masses palpated. No hepatosplenomegaly. Bowel  sounds positive.  Musculoskeletal: no clubbing / cyanosis.  Good ROM, no contractures. Normal muscle tone.  Skin: no rashes, lesions, ulcers. No induration Neurologic: CN 2-12 grossly intact. Sensation intact, DTR normal. Strength 5/5 in all 4.  Psychiatric: Normal judgment and insight. Alert and oriented x 3. Normal mood.  Labs on Admission: I have personally reviewed following labs and imaging studies  CBC: Recent Labs  Lab 07/07/21 0126  WBC 17.6*  HGB 14.8  HCT 44.0  MCV 88.2  PLT 220    Basic Metabolic Panel: Recent Labs  Lab 07/07/21 0126  NA 130*  K 4.9  CL 97*  CO2 24  GLUCOSE 186*  BUN 24*  CREATININE 0.63  CALCIUM 9.4    GFR: Estimated Creatinine Clearance: 39.9 mL/min (by C-G formula based on SCr of 0.63 mg/dL).  Liver Function Tests: Recent Labs  Lab 07/07/21 0126  AST 52*  ALT 25  ALKPHOS 105  BILITOT 1.0  PROT 7.0  ALBUMIN 4.0    Urine analysis:    Component Value Date/Time   COLORURINE AMBER (A) 07/07/2021 0250   APPEARANCEUR CLOUDY (A) 07/07/2021 0250        LABSPEC 1.018 07/07/2021 0250   PHURINE 5.0 07/07/2021 0250   GLUCOSEU NEGATIVE 07/07/2021 0250   HGBUR NEGATIVE 07/07/2021 0250   BILIRUBINUR NEGATIVE 07/07/2021 0250        KETONESUR NEGATIVE 07/07/2021 0250   PROTEINUR 100 (A) 07/07/2021 0250   NITRITE NEGATIVE 07/07/2021 0250   LEUKOCYTESUR LARGE (A) 07/07/2021 0250   Radiological Exams on Admission: CT ANGIO GI BLEED  Result Date: 07/07/2021 CLINICAL DATA:  87 year old female with bright red blood per rectum. EXAM: CTA ABDOMEN AND PELVIS WITHOUT AND WITH CONTRAST TECHNIQUE: Multidetector CT imaging of the abdomen and pelvis was performed using the standard protocol during bolus administration of intravenous contrast. Multiplanar reconstructed images and MIPs were obtained and reviewed to evaluate the vascular anatomy. CONTRAST:  OMNIPAQUE IOHEXOL 350 MG/ML SOLN COMPARISON:  CT scan of the chest 05/07/2021; MRI of the  thoracic spine 05/29/2021; CTA abdomen 10/18/2010 FINDINGS: VASCULAR Aorta: Calcified atherosclerotic plaque throughout the abdominal aorta without evidence of aneurysm or dissection. Celiac: Patent without evidence of aneurysm, dissection, vasculitis or significant stenosis. SMA: Patent without evidence of aneurysm, dissection, vasculitis or significant stenosis. Renals: Solitary right renal artery. Mild atherosclerotic plaque without significant appearing stenosis. On the left, there is a small accessory artery to the lower pole of the left kidney. The dominant renal artery demonstrates mixed fibrofatty and calcified atherosclerotic plaque resulting in mild to moderate stenosis. IMA: Patent without evidence of aneurysm, dissection, vasculitis or significant stenosis. Inflow: Patent without evidence of aneurysm, dissection, vasculitis or significant stenosis. Proximal Outflow: Bilateral common femoral and visualized portions of the superficial and profunda femoral arteries are patent without evidence of aneurysm, dissection, vasculitis or significant stenosis. Veins: No focal venous abnormality. Review of the MIP images confirms the above findings. NON-VASCULAR Lower chest: Moderate right and small left layering pleural effusions with associated bilateral lower lobe atelectasis. There is a large sliding hiatal hernia. Cardiomegaly with biatrial enlargement. Calcifications visualized along the coronary arteries. No pericardial effusion. Hepatobiliary: Normal hepatic contour and morphology. No discrete hepatic lesion. No intra or extrahepatic biliary ductal dilatation. The gallbladder is abnormal in appearance being slightly distended and with diffuse mild gallbladder wall thickening, particularly around the neck and cystic duct. No radiopaque stones identified by CT. Pancreas: Unremarkable. No pancreatic ductal dilatation or surrounding inflammatory changes. Spleen: Normal in size without focal abnormality.  Adrenals/Urinary Tract: No evidence of hydronephrosis. Punctate stones present within the bilateral upper and lower pole collecting systems. No evidence of stone along the ureters or within the bladder. No abnormal enhancing renal mass. Stomach/Bowel: No evidence of active arterial phase bleeding within the gastrointestinal tract at this time. No  focal bowel wall thickening or evidence of obstruction. There are a few diverticula along the sigmoid colon without evidence of active inflammation. Normal appendix. Lymphatic: No suspicious lymphadenopathy. Reproductive: Uterus and bilateral adnexa are unremarkable for age. Other: No abdominal wall hernia or abnormality. No abdominopelvic ascites. Musculoskeletal: T11 compression fracture status post cement augmentation. Multilevel degenerative changes. No acute fracture or malalignment. IMPRESSION: VASCULAR 1. No evidence of active GI bleeding at this time. 2.  Aortic Atherosclerosis (ICD10-170.0). 3. Mild to moderate stenosis of the origin of the main left renal artery. NON-VASCULAR 1. Abnormal appearance of the gallbladder which is distended and diffusely edematous, particularly around the neck and cystic duct. In the appropriate clinical setting, these findings could be consistent with acute cholecystitis. Consider further evaluation with right upper quadrant ultrasound. 2. Moderately large sliding hiatal hernia. 3. Cardiomegaly with biatrial enlargement. 4. Moderate right and small left layering pleural effusions. 5. Mild sigmoid colonic diverticulosis without evidence of active inflammation. 6. T11 compression fracture status post cement augmentation. Electronically Signed   By: Malachy Moan M.D.   On: 07/07/2021 06:50    07/30/2017 echocardiogram. -------------------------------------------------------------------  LV EF: 60% -   65%   -------------------------------------------------------------------  Indications:      Stroke 434.91.    -------------------------------------------------------------------  History:   PMH:  GERD.  Risk factors:  Hypertension.   -------------------------------------------------------------------  Study Conclusions   - Left ventricle: The cavity size was normal. Cavity obliteration    in systole. Systolic function was normal. The estimated ejection    fraction was in the range of 60% to 65%. Wall motion was normal;    there were no regional wall motion abnormalities. Left    ventricular diastolic function parameters were normal.  - Mitral valve: Calcified annulus. There was mild regurgitation.  - Left atrium: The atrium was mildly to moderately dilated.  - Right ventricle: Systolic function was normal.  - Pulmonary arteries: Systolic pressure was moderately elevated. PA    peak pressure: 49 mm Hg (S).   Impressions:  No cardiac source of emboli was indentified.  EKG: Independently reviewed.  Vent. rate 106 BPM PR interval * ms QRS duration 91 ms QT/QTcB 352/468 ms P-R-T axes * 55 -28 Atrial fibrillation Ventricular premature complex Nonspecific repol abnormality, inferior leads  Assessment/Plan Principal Problem:   Rectal bleeding Inpatient/telemetry. Keep NPO. Hold apixaban. Continue gentle IV hydration. Pantoprazole 40 mg every 24 hours. Ondansetron 4 mg p.o./IV every 6 hours Monitor hematocrit and hemoglobin. Gastroenterology has been consulted.  Active Problems:   Dilated gallbladder with elevated lipase Check RUQ ultrasound. Hold rosuvastatin. Continue ciprofloxacin. Continue metronidazole 500 mg IVPB every 12 hours. Follow CBC, lipase/LFTs.    Acute lower UTI Continue ciprofloxacin 400 mg every 12 hours. Follow-up urine culture and sensitivity.    Hyponatremia Secondary to GI losses. Continue gentle IV fluids. Follow-up sodium level.    Chronic atrial fibrillation (HCC) Hold apixaban. Continue atenolol for rate control.    Hypertension Mild to  moderate L renal artery stenosis Continue atenolol 50 mg p.o. twice daily. Continue lisinopril 10 mg p.o. daily. Monitor BP, HR, GFR and electrolytes.    Renal artery stenosis Treated medically.    Compression fracture of T11 vertebra (HCC) Analgesics as needed.    Hypothyroidism History of multiple thyroid nodules. Do not see a level thyroxine among her meds. Check TSH level.    Type 2 diabetes mellitus with hyperglycemia (HCC) Diet controlled. Currently NPO. Continue gentle IVF.    HLD (hyperlipidemia)   Aortic atherosclerosis (  HCC) Holding rosuvastatin at the moment.    DVT prophylaxis: SCDs.  Apixaban held. Code Status:   DNR. Family Communication:   Disposition Plan:   Patient is from:  SNF.  Anticipated DC to:  SNF.  Anticipated DC date:  07/09/2021 or 07/10/2021.  Anticipated DC barriers: Clinical status.  Consults called:  [facility] (Dr. Dulce Sellar). Admission status:  Inpatient/telemetry.   Severity of Illness: High severity after presenting with rectal bleed, nausea, vomiting in the setting of acute gallbladder distention and acute lower UTI.  The patient will remain in the hospital for IV antibiotic therapy, evaluation by gastroenterology and further work-up.  Bobette Mo MD [facility]  How to contact the [facility] Attending or Consulting provider 7A - 7P or covering provider during after hours 7P -7A, for this patient?   Check the care team in [facility] and look for a) attending/consulting [facility] provider listed and b) the [facility] team listed Log into [email or web address] and use [facility] universal password to access. If you do not have the password, please contact the hospital operator. Locate the [facility] provider you are looking for under [facility] and page to a number that you can be directly reached. If you still have difficulty reaching the provider, please page the [facility] (Director on Call) for the Hospitalists listed on amion for assistance.  07/07/2021,  7:57 AM   This document was prepared using Dragon voice recognition software and may contain some unintended transcription errors.

## 2021-07-07 NOTE — ED Provider Notes (Signed)
[facility]-EMERGENCY DEPT Provider Note   CSN: [id number] Arrival date & time: 07/07/21  0116     History No chief complaint on file.   Debbie Hampton is a 87 y.o. female.  Patient presents to the emergency department for evaluation of rectal bleeding.  Patient was reportedly found with blood in her adult diaper tonight.  Patient reports that she has had nausea and vomiting this evening.  She reports that she vomited after dinner and felt better.  She comes to the ER by ambulance.  Upon arrival she is actively vomiting.  Patient with dementia, level 5 caveat.      Past Medical History:  Diagnosis Date   Aortic atherosclerosis (HCC)    Arthritis    Atrial fibrillation (HCC)    a.) CHA2DS2-VASc = 7; on low dose apixaban. b.) TTE 07/30/2017 --> EF 60-65%; mild MR and TR.   Bilateral shoulder pain    Carotid artery disease (HCC)    a.)  BILATERAL carotid Doppler on 07/30/2017 --> mild to moderate plaque in BILATERAL ICAs compatible with 50-69% stenosis (R >L).   Chronic anticoagulation    Apixaban   Colon polyp    Constipation    Coronary artery calcification    a.) CT chest on 05/07/2021 --> 3 vessel CAD.   Depression    DOE (dyspnea on exertion)    Falls    GERD (gastroesophageal reflux disease)    Hiatal hernia    a.) CT chest on 05/07/2021 --> large; distended with fluid.   History of hiatal hernia    HLD (hyperlipidemia)    Hypertension    Impaired fasting glucose    Multiple thyroid nodules    Bilateral   Stroke (HCC) 07/29/2017   a.) Acute 1.5 cm infarction within the periventricular deep white matter on the RIGHT adjacent to the posterior body of the lateral ventricle.   Syncope 03/2016   with CHI   Valvular regurgitation    a.) TTE 08/03/2017 --> mild MR and TR.   Varicose veins of both lower extremities     Patient Active Problem List   Diagnosis Date Noted   Status post kyphoplasty 06/05/2021   Hypothyroidism 05/07/2021   Type 2  diabetes mellitus without complications (HCC) 05/07/2021   Compression fracture of body of thoracic vertebra (HCC) 05/07/2021   Hypertensive urgency 05/06/2021   Compression fracture of T11 vertebra (HCC) 05/06/2021   History of stroke 12/06/2019   Atrial fibrillation, controlled (HCC) 12/06/2019   Atrial fibrillation (HCC)    Hypertension    Elevated troponin 11/26/2019   Closed Colles' fracture of left radius 10/03/2017   Labile blood pressure    Benign essential HTN    Hypertensive crisis    Hyponatremia    Prediabetes    Hypoalbuminemia due to protein-calorie malnutrition (HCC)    Acute blood loss anemia    Stroke (cerebrum) (HCC) 08/01/2017   Acute CVA (cerebrovascular accident) (HCC) 07/29/2017   Syncope, near 03/31/2016   Gastro-esophageal reflux disease without esophagitis 02/15/2014    Past Surgical History:  Procedure Laterality Date   COLONOSCOPY     KYPHOPLASTY N/A 06/05/2021   Procedure: T 11 KYPHOPLASTY;  Surgeon: Kennedy Bucker, MD;  Location: [facility] ORS;  Service: Orthopedics;  Laterality: N/A;   TONSILLECTOMY     TUMOR REMOVAL     ovaries     OB History   No obstetric history on file.     Family History  Problem Relation Age of Onset  Hypertension Other    Breast cancer Sister    Kidney cancer Neg Hx    Kidney disease Neg Hx    Prostate cancer Neg Hx     Social History   Tobacco Use   Smoking status: Former    Types: Cigarettes    Quit date: 02/16/1984    Years since quitting: 37.4   Smokeless tobacco: Never  Substance Use Topics   Alcohol use: No   Drug use: No    Home Medications Prior to Admission medications   Medication Sig Start Date End Date Taking? Authorizing Provider  atenolol (TENORMIN) 50 MG tablet Take 1 tablet (50 mg total) by mouth 2 (two) times daily. 05/09/21  Yes Enedina Finner, MD  calcium carbonate (TUMS EX) 750 MG chewable tablet Chew 2 tablets by mouth 3 (three) times daily as needed for heartburn.   Yes [provider]  cholecalciferol (VITAMIN D3) 25 MCG (1000 UNIT) tablet Take 2,000 Units by mouth daily.   Yes [provider]  citalopram (CELEXA) 20 MG tablet Take 20 mg by mouth daily.    Yes [provider]  diclofenac Sodium (VOLTAREN) 1 % GEL Apply 2 g topically 4 (four) times daily. 04/30/21  Yes [provider]  Esomeprazole Magnesium 20 MG TBEC Take 20 mg by mouth daily at 12 noon.   Yes [provider]  Fluocinolone Acetonide 0.01 % OIL Place 2 drops into both ears once a week. Wednesdays 05/02/21  Yes [provider]  fluticasone (FLONASE) 50 MCG/ACT nasal spray Place 1 spray into both nostrils daily as needed for allergies or rhinitis.   Yes [provider]  furosemide (LASIX) 20 MG tablet Take 1 tablet (20 mg total) by mouth daily as needed. For SOB or leg edema 05/09/21  Yes Enedina Finner, MD  guaiFENesin (MUCINEX) 600 MG 12 hr tablet Take 600 mg by mouth 2 (two) times daily as needed (congestion).   Yes [provider]  HYDROcodone-acetaminophen (NORCO/VICODIN) 5-325 MG tablet Take 1-2 tablets by mouth every 4 (four) hours as needed for moderate pain (pain score 4-6). 06/06/21  Yes Evon Slack, PA-C  iron polysaccharides (NIFEREX) 150 MG capsule Take 150 mg by mouth daily.   Yes [provider]  lidocaine (LIDODERM) 5 % Place 1 patch onto the skin daily. Remove & Discard patch within 12 hours or as directed by MD 05/10/21  Yes Enedina Finner, MD  lisinopril (ZESTRIL) 10 MG tablet Take 10 mg by mouth daily. 11/21/20  Yes [provider]  liver oil-zinc oxide (DESITIN) 40 % ointment Apply 1 application topically in the morning, at noon, and at bedtime. Apply to buttocks   Yes [provider]  Multiple Vitamins-Minerals (CERTAVITE/ANTIOXIDANTS) TABS Take 1 tablet by mouth daily.   Yes [provider]  omega-3 acid ethyl esters (LOVAZA) 1 g capsule Take 1 g by mouth 2 (two) times daily.   Yes  [provider]  polyethylene glycol (MIRALAX / GLYCOLAX) packet Take 17 g by mouth daily. 08/14/17  Yes Love, Evlyn Kanner, PA-C  rosuvastatin (CRESTOR) 20 MG tablet Take 20 mg by mouth at bedtime.   Yes [provider]  sodium chloride (OCEAN) 0.65 % SOLN nasal spray Place 1 spray into both nostrils 3 (three) times daily as needed for congestion.    Yes [provider]  tiZANidine (ZANAFLEX) 2 MG tablet Take 2 mg by mouth as needed (twice daily as needed). 04/26/21  Yes [provider]  famotidine (  PEPCID) 40 MG tablet Take 40 mg by mouth at bedtime. Patient not taking: Reported on 07/07/2021 06/27/20   [provider]  mometasone (ELOCON) 0.1 % lotion Apply 4 drops topically at bedtime as needed for itching or dry skin. Place into each ear. Patient not taking: Reported on 07/07/2021 05/12/20   [provider]  oxymetazoline (AFRIN) 0.05 % nasal spray Place 1 spray into both nostrils 3 (three) times daily as needed for congestion. Patient not taking: Reported on 07/07/2021    [provider]  Polyethyl Glycol-Propyl Glycol 0.4-0.3 % SOLN Place 1 drop into both eyes 3 (three) times daily as needed (dry eyes). Patient not taking: Reported on 07/07/2021    [provider]  Sunscreens (AVEENO DAILY MOISTURIZER) LOTN Apply topically 2 (two) times daily as needed (for dry skin). Patient not taking: Reported on 07/07/2021    [provider]    Allergies    Cheese, Mushroom extract complex, Penicillins, Verapamil, Aloe, Amlodipine, Cephalexin, Metoprolol, Miconazole, Neosporin [neomycin-bacitracin zn-polymyx], and Trandolapril-verapamil hcl er  Review of Systems   Review of Systems  Physical Exam Updated Vital Signs BP (!) 191/134   Pulse (!) 116   Temp 97.6 F (36.4 C) (Axillary)   Resp 17   Ht 5\' 2"  (1.575 m)   Wt 59.9 kg   SpO2 93%   BMI 24.14 kg/m   Physical Exam Exam conducted with a chaperone present.   Genitourinary:    Rectum: Guaiac result positive.    ED Results / Procedures / Treatments   Labs (all labs ordered are listed, but only abnormal results are displayed) Labs Reviewed  CBC WITH DIFFERENTIAL/PLATELET - Abnormal; Notable for the following components:      Result Value   WBC 17.6 (*)    All other components within normal limits  COMPREHENSIVE METABOLIC PANEL - Abnormal; Notable for the following components:   Sodium 130 (*)    Chloride 97 (*)    Glucose, Bld 186 (*)    BUN 24 (*)    AST 52 (*)    All other components within normal limits  LIPASE, BLOOD - Abnormal; Notable for the following components:   Lipase 68 (*)    All other components within normal limits  URINALYSIS, ROUTINE W REFLEX MICROSCOPIC - Abnormal; Notable for the following components:   Color, Urine AMBER (*)    APPearance CLOUDY (*)    Protein, ur 100 (*)    Leukocytes,Ua LARGE (*)    WBC, UA >50 (*)    Bacteria, UA MANY (*)    All other components within normal limits  POC OCCULT BLOOD, ED - Abnormal; Notable for the following components:   Fecal Occult Bld POSITIVE (*)    All other components within normal limits  URINE CULTURE  RESP PANEL BY RT-PCR (FLU A&B, COVID) ARPGX2  TYPE AND SCREEN  ABO/RH    EKG None  Radiology CT ANGIO GI BLEED  Result Date: 07/07/2021 CLINICAL DATA:  87 year old female with bright red blood per rectum. EXAM: CTA ABDOMEN AND PELVIS WITHOUT AND WITH CONTRAST TECHNIQUE: Multidetector CT imaging of the abdomen and pelvis was performed using the standard protocol during bolus administration of intravenous contrast. Multiplanar reconstructed images and MIPs were obtained and reviewed to evaluate the vascular anatomy. CONTRAST:  88 OMNIPAQUE IOHEXOL 350 MG/ML SOLN COMPARISON:  CT scan of the chest 05/07/2021; MRI of the thoracic spine 05/29/2021; CTA abdomen 10/18/2010 FINDINGS: VASCULAR Aorta: Calcified atherosclerotic plaque throughout the abdominal aorta  without evidence  of aneurysm or dissection. Celiac: Patent without evidence of aneurysm, dissection, vasculitis or significant stenosis. SMA: Patent without evidence of aneurysm, dissection, vasculitis or significant stenosis. Renals: Solitary right renal artery. Mild atherosclerotic plaque without significant appearing stenosis. On the left, there is a small accessory artery to the lower pole of the left kidney. The dominant renal artery demonstrates mixed fibrofatty and calcified atherosclerotic plaque resulting in mild to moderate stenosis. IMA: Patent without evidence of aneurysm, dissection, vasculitis or significant stenosis. Inflow: Patent without evidence of aneurysm, dissection, vasculitis or significant stenosis. Proximal Outflow: Bilateral common femoral and visualized portions of the superficial and profunda femoral arteries are patent without evidence of aneurysm, dissection, vasculitis or significant stenosis. Veins: No focal venous abnormality. Review of the MIP images confirms the above findings. NON-VASCULAR Lower chest: Moderate right and small left layering pleural effusions with associated bilateral lower lobe atelectasis. There is a large sliding hiatal hernia. Cardiomegaly with biatrial enlargement. Calcifications visualized along the coronary arteries. No pericardial effusion. Hepatobiliary: Normal hepatic contour and morphology. No discrete hepatic lesion. No intra or extrahepatic biliary ductal dilatation. The gallbladder is abnormal in appearance being slightly distended and with diffuse mild gallbladder wall thickening, particularly around the neck and cystic duct. No radiopaque stones identified by CT. Pancreas: Unremarkable. No pancreatic ductal dilatation or surrounding inflammatory changes. Spleen: Normal in size without focal abnormality. Adrenals/Urinary Tract: No evidence of hydronephrosis. Punctate stones present within the bilateral upper and lower pole collecting systems. No  evidence of stone along the ureters or within the bladder. No abnormal enhancing renal mass. Stomach/Bowel: No evidence of active arterial phase bleeding within the gastrointestinal tract at this time. No focal bowel wall thickening or evidence of obstruction. There are a few diverticula along the sigmoid colon without evidence of active inflammation. Normal appendix. Lymphatic: No suspicious lymphadenopathy. Reproductive: Uterus and bilateral adnexa are unremarkable for age. Other: No abdominal wall hernia or abnormality. No abdominopelvic ascites. Musculoskeletal: T11 compression fracture status post cement augmentation. Multilevel degenerative changes. No acute fracture or malalignment. IMPRESSION: VASCULAR 1. No evidence of active GI bleeding at this time. 2.  Aortic Atherosclerosis (ICD10-170.0). 3. Mild to moderate stenosis of the origin of the main left renal artery. NON-VASCULAR 1. Abnormal appearance of the gallbladder which is distended and diffusely edematous, particularly around the neck and cystic duct. In the appropriate clinical setting, these findings could be consistent with acute cholecystitis. Consider further evaluation with right upper quadrant ultrasound. 2. Moderately large sliding hiatal hernia. 3. Cardiomegaly with biatrial enlargement. 4. Moderate right and small left layering pleural effusions. 5. Mild sigmoid colonic diverticulosis without evidence of active inflammation. 6. T11 compression fracture status post cement augmentation. Electronically Signed   By: Malachy Moan M.D.   On: 07/07/2021 06:50    Procedures Procedures   Medications Ordered in ED Medications  sodium chloride 0.9 % bolus 500 mL (500 mLs Intravenous New Bag/Given 07/07/21 0235)  ondansetron (ZOFRAN) injection 4 mg (4 mg Intravenous Given 07/07/21 0235)  iohexol (OMNIPAQUE) 350 MG/ML injection 100 mL (100 mLs Intravenous Contrast Given 07/07/21 0515)  atenolol (TENORMIN) tablet 50 mg (50 mg Oral Given  07/07/21 0659)  lisinopril (ZESTRIL) tablet 10 mg (10 mg Oral Given 07/07/21 5400)    ED Course  I have reviewed the triage vital signs and the nursing notes.  Pertinent labs & imaging results that were available during my care of the patient were reviewed by me and considered in my medical decision making (see chart for details).  MDM Rules/Calculators/A&P                           Patient sent to the emergency department for evaluation of rectal bleeding.  Patient with acute nausea and vomiting over the course of the evening.  She then had bright red blood in her diaper discovered by nursing home staff.  Patient does have a history of dementia, cannot provide a lot of additional information.  Patient is on low-dose Eliquis secondary to history of chronic atrial fibrillation.  Rectal examination revealed dark stool that was flash heme positive.  She has not, however, anemic.  Patient did have a leukocytosis as well.  She does have evidence of urinary tract infection which might be causing the leukocytosis.  CT scan did not show any acute inflammatory infectious process.  There was no active bleeding noted.  Patient has become progressively tachycardic and more hypertensive.  She is chronically in atrial fibrillation.  Will give morning dose of meds.  May need additional meds for rate control and blood pressure control.  Based on her rectal bleeding on chronic anticoagulation and concomitant urinary tract infection, will ask hospitalist to admit patient.  Final Clinical Impression(s) / ED Diagnoses Final diagnoses:  Rectal bleeding  Urinary tract infection without hematuria, site unspecified    Rx / DC Orders ED Discharge Orders     None        Larue Drawdy, Canary Brim, MD 07/07/21 0710

## 2021-07-07 NOTE — Progress Notes (Signed)
[facility] admitting physician addendum:  The nursing staff reported that the patient's blood pressure was 197/108 mmHg.  She is also in pain.  Morphine will be given.  Hydralazine 10 mg IVP x1 dose and atenolol 25 mg p.o. x1 dose ordered.  We will continue to monitor the blood pressure.  Sanda Klein, MD.

## 2021-07-07 NOTE — Progress Notes (Signed)
[facility] admitting physician addendum:  I spoke to the Debbie Hampton who is her son and POA at [phone] and updated him about the diagnoses, work-up and treatment plan.  He voiced understanding.  Please call him at above phone number for any future updates.  Also, the nursing staff informed me that radiology is unable to do the HIDA scan at this time and the study will have to wait till Monday.  I briefly discussed the case with general surgery on-call (Dr. Dwain Sarna) who recommended to continue supportive care, IV antibiotics, check HIDA scan and possible percutaneous cholecystostomy if HIDA results are positive as she is a very poor surgical candidate.  Sanda Klein, MD.

## 2021-07-07 NOTE — ED Triage Notes (Signed)
A&Ox2 is her baseline. Facility noticed patient had bright red blood in her brief. Patient has reported n/v for about a day.  190/100 hx hypertension 80 hr 98% RA  204 CBG diabetes type 2   She's at this facility because she has a T11 and T12 wedge fracture.   Extremely HOH

## 2021-07-07 NOTE — Consult Note (Signed)
[facility] Consultation Note  Referring Provider: [facility] Primary Care Physician:  Derinda Late, MD  Reason for Consultation:  abdominal pain, nausea, vomiting, blood in stool  HPI: Debbie Hampton is a 87 y.o. female recent back surgery, presents nausea, vomiting, abdominal pain.  Very hard to get history, she fluctuates in and out of mental status.  Reports of blood in stool, but she doesn't know about this.  CT angio negative for acute bleeding, not aware of any bleeding in ED.  She is most concerned about abdominal pain and nausea and vomiting.  CT angio showed GB thickening, which is corroborated by U/S.  Patient has leukocytosis.  Patient unable to provide further history.   Past Medical History:  Diagnosis Date   Aortic atherosclerosis ([place])    Arthritis    Atrial fibrillation ([place])    a.) CHA2DS2-VASc = 7; on low dose apixaban. b.) TTE 07/30/2017 --> EF 60-65%; mild MR and TR.   Bilateral shoulder pain    Carotid artery disease ([facility])    a.)  BILATERAL carotid Doppler on 07/30/2017 --> mild to moderate plaque in BILATERAL ICAs compatible with 50-69% stenosis (R >L).   Chronic anticoagulation    Apixaban   Colon polyp    Constipation    Coronary artery calcification    a.) CT chest on 05/07/2021 --> 3 vessel CAD.   Depression    DOE (dyspnea on exertion)    Falls    GERD (gastroesophageal reflux disease)    Hiatal hernia    a.) CT chest on 05/07/2021 --> large; distended with fluid.   History of hiatal hernia    HLD (hyperlipidemia)    Hypertension    Impaired fasting glucose    Multiple thyroid nodules    Bilateral   Stroke ([place]) 07/29/2017   a.) Acute 1.5 cm infarction within the periventricular deep white matter on the RIGHT adjacent to the posterior body of the lateral ventricle.   Syncope 03/2016   with CHI   Valvular regurgitation    a.) TTE 08/03/2017 --> mild MR and TR.   Varicose veins of both lower extremities     Past Surgical  History:  Procedure Laterality Date   COLONOSCOPY     KYPHOPLASTY N/A 06/05/2021   Procedure: T 11 KYPHOPLASTY;  Surgeon: Hessie Knows, MD;  Location: [facility] ORS;  Service: Orthopedics;  Laterality: N/A;   TONSILLECTOMY     TUMOR REMOVAL     ovaries    Prior to Admission medications   Medication Sig Start Date End Date Taking? Authorizing Provider  atenolol (TENORMIN) 50 MG tablet Take 1 tablet (50 mg total) by mouth 2 (two) times daily. 05/09/21  Yes Fritzi Mandes, MD  calcium carbonate (TUMS EX) 750 MG chewable tablet Chew 2 tablets by mouth 3 (three) times daily as needed for heartburn.   Yes [provider]  cholecalciferol (VITAMIN D3) 25 MCG (1000 UNIT) tablet Take 2,000 Units by mouth daily.   Yes [provider]  citalopram (CELEXA) 20 MG tablet Take 20 mg by mouth daily.    Yes [provider]  diclofenac Sodium (VOLTAREN) 1 % GEL Apply 2 g topically 4 (four) times daily. 04/30/21  Yes [provider]  Esomeprazole Magnesium 20 MG TBEC Take 20 mg by mouth daily at 12 noon.   Yes [provider]  Fluocinolone Acetonide 0.01 % OIL Place 2 drops into both ears once a week. Wednesdays 05/02/21  Yes [provider]  fluticasone (FLONASE) 50 MCG/ACT  nasal spray Place 1 spray into both nostrils daily as needed for allergies or rhinitis.   Yes [provider]  furosemide (LASIX) 20 MG tablet Take 1 tablet (20 mg total) by mouth daily as needed. For SOB or leg edema 05/09/21  Yes Fritzi Mandes, MD  guaiFENesin (MUCINEX) 600 MG 12 hr tablet Take 600 mg by mouth 2 (two) times daily as needed (congestion).   Yes [provider]  HYDROcodone-acetaminophen (NORCO/VICODIN) 5-325 MG tablet Take 1-2 tablets by mouth every 4 (four) hours as needed for moderate pain (pain score 4-6). 06/06/21  Yes Duanne Guess, PA-C  iron polysaccharides (NIFEREX) 150 MG capsule Take 150 mg by mouth daily.   Yes [provider]  lidocaine  (LIDODERM) 5 % Place 1 patch onto the skin daily. Remove & Discard patch within 12 hours or as directed by MD 05/10/21  Yes Fritzi Mandes, MD  lisinopril (ZESTRIL) 10 MG tablet Take 10 mg by mouth daily. 11/21/20  Yes [provider]  liver oil-zinc oxide (DESITIN) 40 % ointment Apply 1 application topically in the morning, at noon, and at bedtime. Apply to buttocks   Yes [provider]  Multiple Vitamins-Minerals (CERTAVITE/ANTIOXIDANTS) TABS Take 1 tablet by mouth daily.   Yes [provider]  omega-3 acid ethyl esters (LOVAZA) 1 g capsule Take 1 g by mouth 2 (two) times daily.   Yes [provider]  polyethylene glycol (MIRALAX / GLYCOLAX) packet Take 17 g by mouth daily. 08/14/17  Yes Love, Ivan Anchors, PA-C  rosuvastatin (CRESTOR) 20 MG tablet Take 20 mg by mouth at bedtime.   Yes [provider]  sodium chloride (OCEAN) 0.65 % SOLN nasal spray Place 1 spray into both nostrils 3 (three) times daily as needed for congestion.    Yes [provider]  tiZANidine (ZANAFLEX) 2 MG tablet Take 2 mg by mouth as needed (twice daily as needed). 04/26/21  Yes [provider]  famotidine (PEPCID) 40 MG tablet Take 40 mg by mouth at bedtime. Patient not taking: Reported on 07/07/2021 06/27/20   [provider]  mometasone (ELOCON) 0.1 % lotion Apply 4 drops topically at bedtime as needed for itching or dry skin. Place into each ear. Patient not taking: Reported on 07/07/2021 05/12/20   [provider]  oxymetazoline (AFRIN) 0.05 % nasal spray Place 1 spray into both nostrils 3 (three) times daily as needed for congestion. Patient not taking: Reported on 07/07/2021    [provider]  Polyethyl Glycol-Propyl Glycol 0.4-0.3 % SOLN Place 1 drop into both eyes 3 (three) times daily as needed (dry eyes). Patient not taking: Reported on 07/07/2021    [provider]  Sunscreens (AVEENO DAILY MOISTURIZER) LOTN Apply topically  2 (two) times daily as needed (for dry skin). Patient not taking: Reported on 07/07/2021    [provider]    Current Facility-Administered Medications  Medication Dose Route Frequency Provider Last Rate Last Admin   acetaminophen (TYLENOL) tablet 650 mg  650 mg Oral Q6H PRN Reubin Milan, MD       Or   acetaminophen (TYLENOL) suppository 650 mg  650 mg Rectal Q6H PRN Reubin Milan, MD       atenolol (TENORMIN) tablet 50 mg  50 mg Oral BID Reubin Milan, MD       ciprofloxacin (CIPRO) IVPB 400 mg  400 mg Intravenous BID Reubin Milan, MD 200 mL/hr at 07/07/21 0842 400 mg at 07/07/21 [phone]  citalopram (CELEXA) tablet 20 mg  20 mg Oral Daily Reubin Milan, MD       diclofenac Sodium (VOLTAREN) 1 % topical gel 2 g  2 g Topical QID Reubin Milan, MD       lactated ringers infusion   Intravenous Continuous Reubin Milan, MD 50 mL/hr at 07/07/21 0842 New Bag at 07/07/21 0842   [START ON 07/08/2021] lisinopril (ZESTRIL) tablet 10 mg  10 mg Oral Daily Reubin Milan, MD       morphine 2 MG/ML injection 2 mg  2 mg Intravenous Q3H PRN Reubin Milan, MD       ondansetron [facility]) tablet 4 mg  4 mg Oral Q6H PRN Reubin Milan, MD       Or   ondansetron [facility]) injection 4 mg  4 mg Intravenous Q6H PRN Reubin Milan, MD       pantoprazole (PROTONIX) injection 40 mg  40 mg Intravenous Daily Reubin Milan, MD   40 mg at 07/07/21 1027   Current Outpatient Medications  Medication Sig Dispense Refill   atenolol (TENORMIN) 50 MG tablet Take 1 tablet (50 mg total) by mouth 2 (two) times daily. 60 tablet 0   calcium carbonate (TUMS EX) 750 MG chewable tablet Chew 2 tablets by mouth 3 (three) times daily as needed for heartburn.     cholecalciferol (VITAMIN D3) 25 MCG (1000 UNIT) tablet Take 2,000 Units by mouth daily.     citalopram (CELEXA) 20 MG tablet Take 20 mg by mouth daily.      diclofenac Sodium (VOLTAREN) 1 % GEL Apply 2 g  topically 4 (four) times daily.     Esomeprazole Magnesium 20 MG TBEC Take 20 mg by mouth daily at 12 noon.     Fluocinolone Acetonide 0.01 % OIL Place 2 drops into both ears once a week. Wednesdays     fluticasone (FLONASE) 50 MCG/ACT nasal spray Place 1 spray into both nostrils daily as needed for allergies or rhinitis.     furosemide (LASIX) 20 MG tablet Take 1 tablet (20 mg total) by mouth daily as needed. For SOB or leg edema 30 tablet 0   guaiFENesin (MUCINEX) 600 MG 12 hr tablet Take 600 mg by mouth 2 (two) times daily as needed (congestion).     HYDROcodone-acetaminophen (NORCO/VICODIN) 5-325 MG tablet Take 1-2 tablets by mouth every 4 (four) hours as needed for moderate pain (pain score 4-6). 30 tablet 0   iron polysaccharides (NIFEREX) 150 MG capsule Take 150 mg by mouth daily.     lidocaine (LIDODERM) 5 % Place 1 patch onto the skin daily. Remove & Discard patch within 12 hours or as directed by MD 30 patch 0   lisinopril (ZESTRIL) 10 MG tablet Take 10 mg by mouth daily.     liver oil-zinc oxide (DESITIN) 40 % ointment Apply 1 application topically in the morning, at noon, and at bedtime. Apply to buttocks     Multiple Vitamins-Minerals (CERTAVITE/ANTIOXIDANTS) TABS Take 1 tablet by mouth daily.     omega-3 acid ethyl esters (LOVAZA) 1 g capsule Take 1 g by mouth 2 (two) times daily.     polyethylene glycol (MIRALAX / GLYCOLAX) packet Take 17 g by mouth daily. 30 each 0   rosuvastatin (CRESTOR) 20 MG tablet Take 20 mg by mouth at bedtime.     sodium chloride (OCEAN) 0.65 % SOLN nasal spray Place 1 spray into both nostrils 3 (three) times daily as needed for  congestion.      tiZANidine (ZANAFLEX) 2 MG tablet Take 2 mg by mouth as needed (twice daily as needed).     famotidine (PEPCID) 40 MG tablet Take 40 mg by mouth at bedtime. (Patient not taking: Reported on 07/07/2021)     mometasone (ELOCON) 0.1 % lotion Apply 4 drops topically at bedtime as needed for itching or dry skin. Place into  each ear. (Patient not taking: Reported on 07/07/2021)     oxymetazoline (AFRIN) 0.05 % nasal spray Place 1 spray into both nostrils 3 (three) times daily as needed for congestion. (Patient not taking: Reported on 07/07/2021)     Polyethyl Glycol-Propyl Glycol 0.4-0.3 % SOLN Place 1 drop into both eyes 3 (three) times daily as needed (dry eyes). (Patient not taking: Reported on 07/07/2021)     Sunscreens (AVEENO DAILY MOISTURIZER) LOTN Apply topically 2 (two) times daily as needed (for dry skin). (Patient not taking: Reported on 07/07/2021)      Allergies as of 07/07/2021 - Review Complete 07/07/2021  Allergen Reaction Noted   Cheese  07/16/2020   Mushroom extract complex  08/10/2017   Penicillins  03/31/2016   Verapamil  07/16/2020   Aloe Hives and Rash 03/31/2016   Amlodipine Hives, Rash, and Hypertension 04/02/2016   Cephalexin Rash 04/02/2016   Metoprolol Hives, Palpitations, and Hypertension 04/02/2016   Miconazole Swelling and Rash 04/02/2016   Neosporin [neomycin-bacitracin zn-polymyx] Rash 04/02/2016   Trandolapril-verapamil hcl er Rash 04/02/2016    Family History  Problem Relation Age of Onset   Hypertension Other    Breast cancer Sister    Kidney cancer Neg Hx    Kidney disease Neg Hx    Prostate cancer Neg Hx     Social History   Socioeconomic History   Marital status: Married    Spouse name: Not on file   Number of children: Not on file   Years of education: Not on file   Highest education level: Not on file  Occupational History   Not on file  Tobacco Use   Smoking status: Former    Types: Cigarettes    Quit date: 02/16/1984    Years since quitting: 37.4   Smokeless tobacco: Never  Substance and Sexual Activity   Alcohol use: No   Drug use: No   Sexual activity: Not on file  Other Topics Concern   Not on file  Social History Narrative   Currently resides at [place] facility   Social Determinants of Health   Financial Resource Strain:  Not on file  Food Insecurity: Not on file  Transportation Needs: Not on file  Physical Activity: Not on file  Stress: Not on file  Social Connections: Not on file  Intimate Partner Violence: Not on file    Review of Systems: As per HPI, all others negative  Physical Exam: Vital signs in last 24 hours: Temp:  [97.6 F (36.4 C)] 97.6 F (36.4 C) (10/22 0143) Pulse Rate:  [87-124] 124 (10/22 1030) Resp:  [16-41] 26 (10/22 1030) BP: (144-224)/(82-134) 153/86 (10/22 1030) SpO2:  [92 %-96 %] 93 % (10/22 1030) Weight:  [59.9 kg] 59.9 kg (10/22 0143)   General:   Vacillating mental status, frail, elderly, chronically ill-appearing Head:  Normocephalic and atraumatic. Eyes:  Sclera clear, no icterus.   Conjunctiva pink. Ears:  Normal auditory acuity. Nose:  No deformity, discharge,  or lesions. Mouth:  No deformity or lesions.  Oropharynx dry Neck:  Supple; no masses or thyromegaly. Abdomen:  RUQ tenderness; No masses, hepatosplenomegaly or hernias noted. Normal bowel sounds, without guarding, and without rebound.     Msk:  Symmetrical without gross deformities. Normal posture. Pulses:  Normal pulses noted. Extremities:  Without clubbing or edema. Neurologic:  Vacillating mental status Skin:  Intact without significant lesions or rashes. Psych:  Fluctuating mental status. Normal mood and affect.   Lab Results: Recent Labs    07/07/21 0126  WBC 17.6*  HGB 14.8  HCT 44.0  PLT 220   BMET Recent Labs    07/07/21 0126  NA 130*  K 4.9  CL 97*  CO2 24  GLUCOSE 186*  BUN 24*  CREATININE 0.63  CALCIUM 9.4   LFT Recent Labs    07/07/21 0126  PROT 7.0  ALBUMIN 4.0  AST 52*  ALT 25  ALKPHOS 105  BILITOT 1.0   PT/INR Recent Labs    07/07/21 0732  LABPROT 12.8  INR 1.0    Studies/Results: CT ANGIO GI BLEED  Result Date: 07/07/2021 CLINICAL DATA:  87 year old female with bright red blood per rectum. EXAM: CTA ABDOMEN AND PELVIS WITHOUT AND WITH CONTRAST  TECHNIQUE: Multidetector CT imaging of the abdomen and pelvis was performed using the standard protocol during bolus administration of intravenous contrast. Multiplanar reconstructed images and MIPs were obtained and reviewed to evaluate the vascular anatomy. CONTRAST:  168m OMNIPAQUE IOHEXOL 350 MG/ML SOLN COMPARISON:  CT scan of the chest 05/07/2021; MRI of the thoracic spine 05/29/2021; CTA abdomen 10/18/2010 FINDINGS: VASCULAR Aorta: Calcified atherosclerotic plaque throughout the abdominal aorta without evidence of aneurysm or dissection. Celiac: Patent without evidence of aneurysm, dissection, vasculitis or significant stenosis. SMA: Patent without evidence of aneurysm, dissection, vasculitis or significant stenosis. Renals: Solitary right renal artery. Mild atherosclerotic plaque without significant appearing stenosis. On the left, there is a small accessory artery to the lower pole of the left kidney. The dominant renal artery demonstrates mixed fibrofatty and calcified atherosclerotic plaque resulting in mild to moderate stenosis. IMA: Patent without evidence of aneurysm, dissection, vasculitis or significant stenosis. Inflow: Patent without evidence of aneurysm, dissection, vasculitis or significant stenosis. Proximal Outflow: Bilateral common femoral and visualized portions of the superficial and profunda femoral arteries are patent without evidence of aneurysm, dissection, vasculitis or significant stenosis. Veins: No focal venous abnormality. Review of the MIP images confirms the above findings. NON-VASCULAR Lower chest: Moderate right and small left layering pleural effusions with associated bilateral lower lobe atelectasis. There is a large sliding hiatal hernia. Cardiomegaly with biatrial enlargement. Calcifications visualized along the coronary arteries. No pericardial effusion. Hepatobiliary: Normal hepatic contour and morphology. No discrete hepatic lesion. No intra or extrahepatic biliary ductal  dilatation. The gallbladder is abnormal in appearance being slightly distended and with diffuse mild gallbladder wall thickening, particularly around the neck and cystic duct. No radiopaque stones identified by CT. Pancreas: Unremarkable. No pancreatic ductal dilatation or surrounding inflammatory changes. Spleen: Normal in size without focal abnormality. Adrenals/Urinary Tract: No evidence of hydronephrosis. Punctate stones present within the bilateral upper and lower pole collecting systems. No evidence of stone along the ureters or within the bladder. No abnormal enhancing renal mass. Stomach/Bowel: No evidence of active arterial phase bleeding within the gastrointestinal tract at this time. No focal bowel wall thickening or evidence of obstruction. There are a few diverticula along the sigmoid colon without evidence of active inflammation. Normal appendix. Lymphatic: No suspicious lymphadenopathy. Reproductive: Uterus and bilateral adnexa are unremarkable for age. Other: No abdominal wall hernia or abnormality. No abdominopelvic ascites. Musculoskeletal:  T11 compression fracture status post cement augmentation. Multilevel degenerative changes. No acute fracture or malalignment. IMPRESSION: VASCULAR 1. No evidence of active GI bleeding at this time. 2.  Aortic Atherosclerosis (ICD10-170.0). 3. Mild to moderate stenosis of the origin of the main left renal artery. NON-VASCULAR 1. Abnormal appearance of the gallbladder which is distended and diffusely edematous, particularly around the neck and cystic duct. In the appropriate clinical setting, these findings could be consistent with acute cholecystitis. Consider further evaluation with right upper quadrant ultrasound. 2. Moderately large sliding hiatal hernia. 3. Cardiomegaly with biatrial enlargement. 4. Moderate right and small left layering pleural effusions. 5. Mild sigmoid colonic diverticulosis without evidence of active inflammation. 6. T11 compression  fracture status post cement augmentation. Electronically Signed   By: Jacqulynn Cadet M.D.   On: 07/07/2021 06:50   US Abdomen Limited RUQ (LIVER/GB)  Result Date: 07/07/2021 CLINICAL DATA:  An 88 year old female presents with gallbladder distension and RIGHT upper quadrant pain. EXAM: ULTRASOUND ABDOMEN LIMITED RIGHT UPPER QUADRANT COMPARISON:  CT angiography of July 07, 2021. FINDINGS: Gallbladder: The gallbladder is distended. Wall is thickened to 5 mm. No reported tenderness over the gallbladder during the examination by the sonographer. Small amount of pericholecystic fluid. No gallstones are visualized. Adjacent bowel appears thickened by ultrasound collapsed on previous CT. Common bile duct: Diameter: 3 mm Liver: Limited assessment due to patient motion and breathing in general with no focal abnormality exhibited in the liver, mild to moderate parenchymal heterogeneity. Portal vein is patent on color Doppler imaging with normal direction of blood flow towards the liver. Other: None. IMPRESSION: Findings that raise the question of acalculus cholecystitis. No reported tenderness is noted over the gallbladder. Would suggest HIDA scan for added specificity. Constellation of findings could also be seen in the setting of volume overload or heart failure or ongoing hepatitis. Question of bowel thickening adjacent to the gallbladder in this patient with reported GI bleeding. Correlate with signs of colitis. Electronically Signed   By: Zetta Bills M.D.   On: 07/07/2021 08:42    Impression:   GI bleeding, does not appear to be main issue at this point, specifically don't think it's causing her RUQ tenderness, GB wall thickening, and leukocytosis.  Maybe hemorrhoids?  Maybe artifactual?  Maybe low grade diverticulosis? Nausea and vomiting. Upper abdominal pain.  Gallbladder wall thickening.  Leukocytosis.  Overall constellation of findings most consistent with acalculous cholecystitis.  Plan:    Antibiotics.  Surgical consultation.  Suspect they'll want HIDA and if anything else needed, suspect it would be PTC given patient's poor operative candidacy. [facility] will follow.   LOS: 0 days   Reine Bristow M  07/07/2021, 10:48 AM  Cell [phone] If no answer or after 5 PM call [phone]

## 2021-07-08 DIAGNOSIS — K625 Hemorrhage of anus and rectum: Secondary | ICD-10-CM | POA: Diagnosis not present

## 2021-07-08 DIAGNOSIS — N390 Urinary tract infection, site not specified: Secondary | ICD-10-CM | POA: Diagnosis not present

## 2021-07-08 DIAGNOSIS — K810 Acute cholecystitis: Secondary | ICD-10-CM | POA: Diagnosis not present

## 2021-07-08 LAB — COMPREHENSIVE METABOLIC PANEL
ALT: 19 U/L (ref 0–44)
AST: 30 U/L (ref 15–41)
Albumin: 3.3 g/dL — ABNORMAL LOW (ref 3.5–5.0)
Alkaline Phosphatase: 74 U/L (ref 38–126)
Anion gap: 8 (ref 5–15)
BUN: 26 mg/dL — ABNORMAL HIGH (ref 8–23)
CO2: 25 mmol/L (ref 22–32)
Calcium: 9.3 mg/dL (ref 8.9–10.3)
Chloride: 98 mmol/L (ref 98–111)
Creatinine, Ser: 0.8 mg/dL (ref 0.44–1.00)
GFR, Estimated: >60 mL/min (ref >60)
Glucose, Bld: 136 mg/dL — ABNORMAL HIGH (ref 70–99)
Potassium: 3.5 mmol/L (ref 3.5–5.1)
Sodium: 131 mmol/L — ABNORMAL LOW (ref 135–145)
Total Bilirubin: 0.6 mg/dL (ref 0.3–1.2)
Total Protein: 5.9 g/dL — ABNORMAL LOW (ref 6.5–8.1)

## 2021-07-08 LAB — CBC
HCT: 37.9 % (ref 36.0–46.0)
Hemoglobin: 12.3 g/dL (ref 12.0–15.0)
MCH: 29.5 pg (ref 26.0–34.0)
MCHC: 32.5 g/dL (ref 30.0–36.0)
MCV: 90.9 fL (ref 80.0–100.0)
Platelets: 169 10*3/uL (ref 150–400)
RBC: 4.17 MIL/uL (ref 3.87–5.11)
RDW: 13.5 % (ref 11.5–15.5)
WBC: 19.6 10*3/uL — ABNORMAL HIGH (ref 4.0–10.5)
nRBC: 0 % (ref 0.0–0.2)

## 2021-07-08 LAB — HEPATIC FUNCTION PANEL
ALT: 16 U/L (ref 0–44)
AST: 28 U/L (ref 15–41)
Albumin: 3.1 g/dL — ABNORMAL LOW (ref 3.5–5.0)
Alkaline Phosphatase: 71 U/L (ref 38–126)
Bilirubin, Direct: 0.2 mg/dL (ref 0.0–0.2)
Indirect Bilirubin: 0.6 mg/dL (ref 0.3–0.9)
Total Bilirubin: 0.8 mg/dL (ref 0.3–1.2)
Total Protein: 5.8 g/dL — ABNORMAL LOW (ref 6.5–8.1)

## 2021-07-08 LAB — GLUCOSE, CAPILLARY
Glucose-Capillary: 106 mg/dL — ABNORMAL HIGH (ref 70–99)
Glucose-Capillary: 111 mg/dL — ABNORMAL HIGH (ref 70–99)
Glucose-Capillary: 144 mg/dL — ABNORMAL HIGH (ref 70–99)
Glucose-Capillary: 146 mg/dL — ABNORMAL HIGH (ref 70–99)
Glucose-Capillary: 177 mg/dL — ABNORMAL HIGH (ref 70–99)

## 2021-07-08 LAB — HEMOGLOBIN A1C
Hgb A1c MFr Bld: 5.6 % (ref 4.8–5.6)
Mean Plasma Glucose: 114.02 mg/dL

## 2021-07-08 LAB — LIPASE, BLOOD: Lipase: 53 U/L — ABNORMAL HIGH (ref 11–51)

## 2021-07-08 MED ORDER — METRONIDAZOLE 500 MG/100ML IV SOLN
500.0000 mg | Freq: Two times a day (BID) | INTRAVENOUS | Status: DC
  Administered 2021-07-08 – 2021-07-14 (×12): 500 mg via INTRAVENOUS
  Filled 2021-07-08 (×11): qty 100

## 2021-07-08 MED ORDER — SODIUM CHLORIDE 0.9 % IV SOLN: INTRAVENOUS | Status: DC | PRN

## 2021-07-08 MED ORDER — BIOTENE DRY MOUTH MT LIQD: 15.0000 mL | OROMUCOSAL | Status: DC | PRN

## 2021-07-08 MED ORDER — SODIUM CHLORIDE 0.9 % IV SOLN
2.0000 g | INTRAVENOUS | Status: DC
  Administered 2021-07-08 – 2021-07-13 (×6): 2 g via INTRAVENOUS
  Filled 2021-07-08 (×7): qty 20

## 2021-07-08 NOTE — Progress Notes (Signed)
Just followed up with Nuclear medicine to ask about what is going on with the HIDA scan or if it has been done. Phone call went straight to voicemail. Left the 5E unit number for them to call back to follow up about this information.

## 2021-07-08 NOTE — Progress Notes (Signed)
Received verbal order from Dr. Waymon Amato, MD to put in order for liver function test. Per Nuclear Medicine, an order for liver function test is needed in order for HIDA scan to be done. Liver function test order not populating when I attempt to put in order. Just contacted [facility] Audrea Muscat, NP to put in order for this to expedite process of HIDA scan getting done.

## 2021-07-08 NOTE — Progress Notes (Signed)
Subjective: Worsening abdominal pain  Objective: Vital signs in last 24 hours: Temp:  [97.3 F (36.3 C)-97.8 F (36.6 C)] 97.3 F (36.3 C) (10/23 0426) Pulse Rate:  [52-105] 95 (10/23 0426) Resp:  [18-31] 18 (10/22 2047) BP: (124-228)/(42-136) 159/84 (10/23 0426) SpO2:  [91 %-97 %] 94 % (10/23 0426) Weight change:     PE: GEN:  Worsening confusion, uncomfortable-appearing ABD:  RUQ tenderness with voluntary guarding  Lab Results: CBC    Component Value Date/Time   WBC 19.6 (H) 07/08/2021 0536   RBC 4.17 07/08/2021 0536   HGB 12.3 07/08/2021 0536   HGB 10.7 (L) 05/04/2014 1850   HCT 37.9 07/08/2021 0536   HCT 31.4 (L) 05/04/2014 1850   PLT 169 07/08/2021 0536   PLT 206 05/04/2014 1850   MCV 90.9 07/08/2021 0536   MCV 87 05/04/2014 1850   MCH 29.5 07/08/2021 0536   MCHC 32.5 07/08/2021 0536   RDW 13.5 07/08/2021 0536   RDW 12.8 05/04/2014 1850   LYMPHSABS 1.7 05/06/2021 0302   LYMPHSABS 1.8 05/04/2014 1850   MONOABS 0.9 05/06/2021 0302   MONOABS 0.5 05/04/2014 1850   EOSABS 0.1 05/06/2021 0302   EOSABS 0.1 05/04/2014 1850   BASOSABS 0.1 05/06/2021 0302   BASOSABS 0.0 05/04/2014 1850  CMP     Component Value Date/Time   NA 131 (L) 07/08/2021 0536   NA 137 05/04/2014 1850   K 3.5 07/08/2021 0536   K 3.5 05/04/2014 1850   CL 98 07/08/2021 0536   CL 99 05/04/2014 1850   CO2 25 07/08/2021 0536   CO2 27 05/04/2014 1850   GLUCOSE 136 (H) 07/08/2021 0536   GLUCOSE 118 (H) 05/04/2014 1850   BUN 26 (H) 07/08/2021 0536   BUN 17 05/04/2014 1850   CREATININE 0.80 07/08/2021 0536   CREATININE 0.86 05/04/2014 1850   CALCIUM 9.3 07/08/2021 0536   CALCIUM 8.2 (L) 05/04/2014 1850   PROT 5.9 (L) 07/08/2021 0536   PROT 5.9 (L) 05/04/2014 1850   ALBUMIN 3.3 (L) 07/08/2021 0536   ALBUMIN 3.2 (L) 05/04/2014 1850   AST 30 07/08/2021 0536   AST 24 05/04/2014 1850   ALT 19 07/08/2021 0536   ALT 28 05/04/2014 1850   ALKPHOS 74 07/08/2021 0536   ALKPHOS 97 05/04/2014 1850    BILITOT 0.6 07/08/2021 0536   BILITOT 0.2 05/04/2014 1850   GFRNONAA >60 07/08/2021 0536   GFRNONAA >60 05/04/2014 1850   GFRAA >60 11/28/2019 0805   GFRAA >60 05/04/2014 1850   Assessment:  Nausea and vomiting. Upper abdominal pain.  Gallbladder wall thickening.  Leukocytosis.  Vacillating mental status (medications?  Sepsis?).  Overall constellation of findings most consistent with escalating acalculous cholecystitis.  Symptoms worse today compared to yesterday.  Plan:   Able to broaden antibiotics instead of just ciprofloxacin?  Aware of PCN allergy; consider asking pharmacy for optimal gram negative and anaerobic coverage, mindful of her PCN and other medication allergies. I think further evaluation in some form needs to be done today.  She is more tender, looks worse, and has escalation of WBC count.   I have asked [facility] to reach back out to surgery for further recommendations.  I suspect ultimately she will need percutaneous cholecystostomy. [facility] will follow.   Freddy Jaksch 07/08/2021, 12:23 PM   Cell [phone] If no answer or after 5 PM call [phone]

## 2021-07-08 NOTE — Progress Notes (Signed)
PROGRESS NOTE   Debbie Hampton  TTS:[id number]    DOB: 09/23/1931    DOA: 07/07/2021  PCP: Derinda Late, MD   I have briefly reviewed patients previous medical records in [facility].  Chief complaint Rectal bleeding and abdominal pain.   Brief Narrative:  87 year old female, SNF resident, extremely hard of hearing, medical history significant for chronic atrial fibrillation on Eliquis, bilateral carotid artery disease, CAD, GERD, hiatal hernia, HTN, HLD, nonhemorrhagic stroke, depression, type II DM, sent from SNF due to rectal bleeding, nausea, vomiting and abdominal pain.  Admitted for possible low volume rectal bleed likely related to hemorrhoids, main issue however is concern for acute acalculous cholecystitis, also being treated for acute cystitis and hyponatremia.  [facility] and general surgery were consulted.   Assessment & Plan:  Principal Problem:   Rectal bleeding Active Problems:   Hyponatremia   Atrial fibrillation (HCC)   Hypertension   Compression fracture of T11 vertebra (HCC)   Hypothyroidism   Type 2 diabetes mellitus with hyperglycemia (HCC)   Dilated gallbladder   Acute lower UTI   Elevated lipase   Renal artery stenosis (HCC)   HLD (hyperlipidemia)   Aortic atherosclerosis (HCC)   Abdominal pain/leukocytosis/abnormal imaging by CT abdomen and RUQ ultrasound concerning for acute acalculous cholecystitis: - CTA abdomen 10/22: Gallbladder distended and diffusely edematous, particularly around the neck and cystic duct, these findings could be consistent with acute cholecystitis. - RUQ ultrasound: Findings raise the question of a calculus cholecystitis.  HIDA scan recommended. [facility] input appreciated.  I discussed with Dr. Paulita Fujita.  Recommends general surgery consultation now. - General surgery consulted and will see patient but recommended HIDA scan and may need PTC if positive (poor surgical candidate for cholecystectomy). - Discussed with  radiology/Dr. Clovis Riley regarding HIDA scan and possible need for PTC and he will assist getting them done. - Continue gentle IV fluids, bowel rest, IV ciprofloxacin.  Low volume rectal bleeding - Likely related to hemorrhoids. - Per RN report, no further bleeding noted but has prolapsing hemorrhoids. - Bowel regimen and Anusol suppositories. - Holding apixaban for now.  Acute gram-negative rod cystitis - Positive urine microscopy - Continue IV Cipro pending urine culture results.  Dehydration with hyponatremia - Likely secondary to GI losses. - Continue gentle IV fluid hydration.  Follow daily BMP.  Stable in the low 130s.  Acute metabolic encephalopathy - Baseline mentation not known. - Likely related to acute infectious etiology discussed above and dehydration. - Delirium precautions.  Minimize opioids as much as possible.  Frequent reorientation.  Follow.  Chronic atrial fibrillation - Continue atenolol for rate control. - Holding apixaban in case interventions are needed.  Leukocytosis - Likely related to acute cystitis and possible cholecystitis. - WBC is worsened from 16-19.  Continue IV antibiotics.  And follow daily CBC.  Essential hypertension - History of left renal artery stenosis, medically treated. - Continue atenolol and lisinopril.  T11 compression fracture s/p intervention - No pain reported.  Hypothyroidism/multiple thyroid nodules - Follow TSH.  Outpatient follow-up.  Type II DM with hyperglycemia, diet controlled - Monitor CBGs and consider SSI if needed.  Hyperlipidemia/aortic atherosclerosis - Holding statins for now.  Extremely hard of hearing - Family to bring hearing aids  Moderately large sliding hiatal hernia/GERD  Bilateral pleural effusions (right > left)  Body mass index is 24.14 kg/m.  Nutritional Status        Pressure Ulcer:     DVT prophylaxis: SCDs Start: 07/07/21 0923  Code Status: DNR Family Communication: I  called patient's son/HCPOA on the phone.  He asked me to hold on for a minute and we got disconnected.  I attempted to call him again without success. Disposition:  Status is: Inpatient  Remains inpatient appropriate because: Concern for acute cholecystitis, ongoing evaluation, acute cystitis, IV fluids, IV antibiotics, consultants evaluation.        Consultants:   [facility] General surgery  Procedures:   None  Antimicrobials:    Anti-infectives (From admission, onward)    Start     Dose/Rate Route Frequency Ordered Stop   07/07/21 0800  ciprofloxacin (CIPRO) IVPB 400 mg        400 mg 200 mL/hr over 60 Minutes Intravenous 2 times daily 07/07/21 0733     07/07/21 0730  ciprofloxacin (CIPRO) IVPB 400 mg  Status:  Discontinued        400 mg 200 mL/hr over 60 Minutes Intravenous  Once 07/07/21 0727 07/07/21 0733   07/07/21 0730  metroNIDAZOLE (FLAGYL) IVPB 500 mg        500 mg 100 mL/hr over 60 Minutes Intravenous  Once 07/07/21 1610 07/07/21 1104         Subjective:  Difficult history due to extremely hard of hearing, not having her hearing aids and some confusion.  Asking if family can bring her hearing aids.  Complains of RUQ abdominal pain.  Dry mouth.  No dyspnea.  Objective:   Vitals:   07/07/21 1644 07/07/21 2047 07/08/21 0047 07/08/21 0426  BP: 136/72 (!) 161/72 (!) 171/90 (!) 159/84  Pulse: 88 86 100 95  Resp: 20 18    Temp: 97.8 F (36.6 C) (!) 97.5 F (36.4 C) 97.8 F (36.6 C) (!) 97.3 F (36.3 C)  TempSrc: Oral Oral Oral Oral  SpO2: 96% 97% 96% 94%  Weight:      Height:        General exam: Elderly female, moderately built, frail and chronically ill looking lying uncomfortably supine in bed without distress.  Oral mucosa dry. Respiratory system: Clear to auscultation. Respiratory effort normal. Cardiovascular system: S1 & S2 heard, irregularly irregular. No JVD, murmurs, rubs, gallops or clicks. No pedal edema.  Telemetry personally reviewed: A. fib  with controlled ventricular rate in the 90s.  Occasionally in the 100s. Gastrointestinal system: Abdomen is nondistended, soft.  RUQ tenderness with some guarding but no rigidity or rebound.  No organomegaly or masses felt. Normal bowel sounds heard. Central nervous system: Alert and oriented to self.  Follows simple instructions. No focal neurological deficits. Extremities: Symmetric 5 x 5 power. Skin: No rashes, lesions or ulcers Psychiatry: Judgement and insight cannot be assessed at this time. Mood & affect appropriate.     Data Reviewed:   I have personally reviewed following labs and imaging studies   CBC: Recent Labs  Lab 07/07/21 0126 07/07/21 2016 07/08/21 0536  WBC 17.6*  --  19.6*  HGB 14.8 13.0 12.3  HCT 44.0  --  37.9  MCV 88.2  --  90.9  PLT 220  --  960    Basic Metabolic Panel: Recent Labs  Lab 07/07/21 0126 07/08/21 0536  NA 130* 131*  K 4.9 3.5  CL 97* 98  CO2 24 25  GLUCOSE 186* 136*  BUN 24* 26*  CREATININE 0.63 0.80  CALCIUM 9.4 9.3    Liver Function Tests: Recent Labs  Lab 07/07/21 0126 07/08/21 0536  AST 52* 30  ALT 25 19  ALKPHOS 105 74  BILITOT 1.0 0.6  PROT 7.0 5.9*  ALBUMIN 4.0 3.3*    CBG: Recent Labs  Lab 07/07/21 1832 07/08/21 0000 07/08/21 0514  GLUCAP 193* 177* 144*    Microbiology Studies:   Recent Results (from the past 240 hour(s))  Urine Culture     Status: Abnormal (Preliminary result)   Collection Time: 07/07/21  2:50 AM   Specimen: Urine, Clean Catch  Result Value Ref Range Status   Specimen Description   Final    URINE, CLEAN CATCH Performed at [facility], [place] [address], [place], [place] [address]    Special Requests   Final    NONE Performed at [facility], [place] [address] [place], [place] [address]    Culture (A)  Final    >=100,000 COLONIES/mL GRAM NEGATIVE RODS SUSCEPTIBILITIES TO FOLLOW Performed at [facility], [place] [address],  [place], [place] [address]    Report Status PENDING  Incomplete  Resp Panel by RT-PCR (Flu A&B, Covid) Nasopharyngeal Swab     Status: None   Collection Time: 07/07/21  7:42 AM   Specimen: Nasopharyngeal Swab; Nasopharyngeal(NP) swabs in vial transport medium  Result Value Ref Range Status   SARS Coronavirus 2 by RT PCR NEGATIVE NEGATIVE Final    Comment: (NOTE) SARS-CoV-2 target nucleic acids are NOT DETECTED.  The SARS-CoV-2 RNA is generally detectable in upper respiratory specimens during the acute phase of infection. The lowest concentration of SARS-CoV-2 viral copies this assay can detect is 138 copies/mL. A negative result does not preclude SARS-Cov-2 infection and should not be used as the sole basis for treatment or other patient management decisions. A negative result may occur with  improper specimen collection/handling, submission of specimen other than nasopharyngeal swab, presence of viral mutation(s) within the areas targeted by this assay, and inadequate number of viral copies(<138 copies/mL). A negative result must be combined with clinical observations, patient history, and epidemiological information. The expected result is Negative.  Fact Sheet for Patients:  EntrepreneurPulse.com.au  Fact Sheet for Healthcare Providers:  IncredibleEmployment.be  This test is no t yet approved or cleared by the [place] FDA and  has been authorized for detection and/or diagnosis of SARS-CoV-2 by FDA under an Emergency Use Authorization (EUA). This EUA will remain  in effect (meaning this test can be used) for the duration of the COVID-19 declaration under Section 564(b)(1) of the Act, 21 U.S.C.section 360bbb-3(b)(1), unless the authorization is terminated  or revoked sooner.       Influenza A by PCR NEGATIVE NEGATIVE Final   Influenza B by PCR NEGATIVE NEGATIVE Final    Comment: (NOTE) The Xpert Xpress SARS-CoV-2/FLU/RSV plus assay is  intended as an aid in the diagnosis of influenza from Nasopharyngeal swab specimens and should not be used as a sole basis for treatment. Nasal washings and aspirates are unacceptable for Xpert Xpress SARS-CoV-2/FLU/RSV testing.  Fact Sheet for Patients: EntrepreneurPulse.com.au  Fact Sheet for Healthcare Providers: IncredibleEmployment.be  This test is not yet approved or cleared by the [place] FDA and has been authorized for detection and/or diagnosis of SARS-CoV-2 by FDA under an Emergency Use Authorization (EUA). This EUA will remain in effect (meaning this test can be used) for the duration of the COVID-19 declaration under Section 564(b)(1) of the Act, 21 U.S.C. section 360bbb-3(b)(1), unless the authorization is terminated or revoked.  Performed at [facility], [place] [address] [place], [place] [address]      Radiology Studies:  CT  ANGIO GI BLEED  Result Date: 07/07/2021 CLINICAL DATA:  87 year old female with bright red blood per rectum. EXAM: CTA ABDOMEN AND PELVIS WITHOUT AND WITH CONTRAST TECHNIQUE: Multidetector CT imaging of the abdomen and pelvis was performed using the standard protocol during bolus administration of intravenous contrast. Multiplanar reconstructed images and MIPs were obtained and reviewed to evaluate the vascular anatomy. CONTRAST:  159m OMNIPAQUE IOHEXOL 350 MG/ML SOLN COMPARISON:  CT scan of the chest 05/07/2021; MRI of the thoracic spine 05/29/2021; CTA abdomen 10/18/2010 FINDINGS: VASCULAR Aorta: Calcified atherosclerotic plaque throughout the abdominal aorta without evidence of aneurysm or dissection. Celiac: Patent without evidence of aneurysm, dissection, vasculitis or significant stenosis. SMA: Patent without evidence of aneurysm, dissection, vasculitis or significant stenosis. Renals: Solitary right renal artery. Mild atherosclerotic plaque without significant appearing stenosis. On  the left, there is a small accessory artery to the lower pole of the left kidney. The dominant renal artery demonstrates mixed fibrofatty and calcified atherosclerotic plaque resulting in mild to moderate stenosis. IMA: Patent without evidence of aneurysm, dissection, vasculitis or significant stenosis. Inflow: Patent without evidence of aneurysm, dissection, vasculitis or significant stenosis. Proximal Outflow: Bilateral common femoral and visualized portions of the superficial and profunda femoral arteries are patent without evidence of aneurysm, dissection, vasculitis or significant stenosis. Veins: No focal venous abnormality. Review of the MIP images confirms the above findings. NON-VASCULAR Lower chest: Moderate right and small left layering pleural effusions with associated bilateral lower lobe atelectasis. There is a large sliding hiatal hernia. Cardiomegaly with biatrial enlargement. Calcifications visualized along the coronary arteries. No pericardial effusion. Hepatobiliary: Normal hepatic contour and morphology. No discrete hepatic lesion. No intra or extrahepatic biliary ductal dilatation. The gallbladder is abnormal in appearance being slightly distended and with diffuse mild gallbladder wall thickening, particularly around the neck and cystic duct. No radiopaque stones identified by CT. Pancreas: Unremarkable. No pancreatic ductal dilatation or surrounding inflammatory changes. Spleen: Normal in size without focal abnormality. Adrenals/Urinary Tract: No evidence of hydronephrosis. Punctate stones present within the bilateral upper and lower pole collecting systems. No evidence of stone along the ureters or within the bladder. No abnormal enhancing renal mass. Stomach/Bowel: No evidence of active arterial phase bleeding within the gastrointestinal tract at this time. No focal bowel wall thickening or evidence of obstruction. There are a few diverticula along the sigmoid colon without evidence of active  inflammation. Normal appendix. Lymphatic: No suspicious lymphadenopathy. Reproductive: Uterus and bilateral adnexa are unremarkable for age. Other: No abdominal wall hernia or abnormality. No abdominopelvic ascites. Musculoskeletal: T11 compression fracture status post cement augmentation. Multilevel degenerative changes. No acute fracture or malalignment. IMPRESSION: VASCULAR 1. No evidence of active GI bleeding at this time. 2.  Aortic Atherosclerosis (ICD10-170.0). 3. Mild to moderate stenosis of the origin of the main left renal artery. NON-VASCULAR 1. Abnormal appearance of the gallbladder which is distended and diffusely edematous, particularly around the neck and cystic duct. In the appropriate clinical setting, these findings could be consistent with acute cholecystitis. Consider further evaluation with right upper quadrant ultrasound. 2. Moderately large sliding hiatal hernia. 3. Cardiomegaly with biatrial enlargement. 4. Moderate right and small left layering pleural effusions. 5. Mild sigmoid colonic diverticulosis without evidence of active inflammation. 6. T11 compression fracture status post cement augmentation. Electronically Signed   By: HJacqulynn CadetM.D.   On: 07/07/2021 06:50   [place]domen Limited RUQ (LIVER/GB)  Result Date: 07/07/2021 CLINICAL DATA:  An 87year old female presents with gallbladder distension and RIGHT upper quadrant pain. EXAM: ULTRASOUND  ABDOMEN LIMITED RIGHT UPPER QUADRANT COMPARISON:  CT angiography of July 07, 2021. FINDINGS: Gallbladder: The gallbladder is distended. Wall is thickened to 5 mm. No reported tenderness over the gallbladder during the examination by the sonographer. Small amount of pericholecystic fluid. No gallstones are visualized. Adjacent bowel appears thickened by ultrasound collapsed on previous CT. Common bile duct: Diameter: 3 mm Liver: Limited assessment due to patient motion and breathing in general with no focal abnormality exhibited in  the liver, mild to moderate parenchymal heterogeneity. Portal vein is patent on color Doppler imaging with normal direction of blood flow towards the liver. Other: None. IMPRESSION: Findings that raise the question of acalculus cholecystitis. No reported tenderness is noted over the gallbladder. Would suggest HIDA scan for added specificity. Constellation of findings could also be seen in the setting of volume overload or heart failure or ongoing hepatitis. Question of bowel thickening adjacent to the gallbladder in this patient with reported GI bleeding. Correlate with signs of colitis. Electronically Signed   By: Zetta Bills M.D.   On: 07/07/2021 08:42     Scheduled Meds:    atenolol  50 mg Oral BID   citalopram  20 mg Oral Daily   diclofenac Sodium  2 g Topical QID   lisinopril  10 mg Oral Daily   pantoprazole (PROTONIX) IV  40 mg Intravenous Daily    Continuous Infusions:    ciprofloxacin 400 mg (07/08/21 1129)   lactated ringers 50 mL/hr at 07/07/21 2003     LOS: 1 day     Vernell Leep, MD, [place], [facility]. [facility]    To contact the attending provider between 7A-7P or the covering provider during after hours 7P-7A, please log into the web site [email or web address] and access using universal [place] password for that web site. If you do not have the password, please call the hospital operator.  07/08/2021, 11:40 AM

## 2021-07-08 NOTE — Consult Note (Addendum)
Reason for Consult: Abdominal pain, lower GI bleed Referring Physician: Dr. Clovis Cao Debbie Hampton is an 87 y.o. female.  HPI: Patient is an 87 year old female who came in secondary to abdominal pain, nausea vomiting, and lower GI bleed.  Patient lives at a adult living facility.  Apparently Friday night she was having some nausea vomiting after dinner.  They found some blood in her sheets after changing her.  She was brought to the ER for further evaluation.  Upon evaluation in the ER she underwent CT angiogram.  There is no evidence of GI bleed at that time.  Has a distended gallbladder that was diffusely edematous.  Patient subsequently underwent ultrasound. Gallbladder was distended.  Wall was thickened at 5 mm.  There was no reported tenderness over the gallbladder.  There is no gallstones.  Small amount of pericholecystic fluid.  Patient had laboratory studies.  These are negative for any elevated transaminases.  Patient's T bili is 0.6.  Patient has an alk phos of 74.  Patient does have an elevated white count at 19.6.  Patient also with UTI laboratory studies.  I did review all patient CT scans ultrasound, laboratory studies personally.  General surgery was consulted for further evaluation secondary to questionable cholecystitis on CT scan/ultrasound.  The  Patient is a poor historian and history was obtained via interview with her son who is in the room.  He is also her POA.  Past Medical History:  Diagnosis Date   Aortic atherosclerosis ([place])    Arthritis    Atrial fibrillation ([place])    a.) CHA2DS2-VASc = 7; on low dose apixaban. b.) TTE 07/30/2017 --> EF 60-65%; mild MR and TR.   Bilateral shoulder pain    Carotid artery disease ([facility])    a.)  BILATERAL carotid Doppler on 07/30/2017 --> mild to moderate plaque in BILATERAL ICAs compatible with 50-69% stenosis (R >L).   Chronic anticoagulation    Apixaban   Colon polyp    Constipation    Coronary artery calcification    a.) CT  chest on 05/07/2021 --> 3 vessel CAD.   Depression    DOE (dyspnea on exertion)    Falls    GERD (gastroesophageal reflux disease)    Hiatal hernia    a.) CT chest on 05/07/2021 --> large; distended with fluid.   History of hiatal hernia    HLD (hyperlipidemia)    Hypertension    Impaired fasting glucose    Multiple thyroid nodules    Bilateral   Stroke ([place]) 07/29/2017   a.) Acute 1.5 cm infarction within the periventricular deep white matter on the RIGHT adjacent to the posterior body of the lateral ventricle.   Syncope 03/2016   with CHI   Valvular regurgitation    a.) TTE 08/03/2017 --> mild MR and TR.   Varicose veins of both lower extremities     Past Surgical History:  Procedure Laterality Date   COLONOSCOPY     KYPHOPLASTY N/A 06/05/2021   Procedure: T 11 KYPHOPLASTY;  Surgeon: Hessie Knows, MD;  Location: [facility] ORS;  Service: Orthopedics;  Laterality: N/A;   TONSILLECTOMY     TUMOR REMOVAL     ovaries    Family History  Problem Relation Age of Onset   Hypertension Other    Breast cancer Sister    Kidney cancer Neg Hx    Kidney disease Neg Hx    Prostate cancer Neg Hx     Social History:  reports that she quit smoking  about 37 years ago. Her smoking use included cigarettes. She has never used smokeless tobacco. She reports that she does not drink alcohol and does not use drugs.  Allergies:  Allergies  Allergen Reactions   Cheese     GOAT cheese   Mushroom Extract Complex    Penicillins     Has patient had a PCN reaction causing immediate rash, facial/tongue/throat swelling, SOB or lightheadedness with hypotension: Yes Has patient had a PCN reaction causing severe rash involving mucus membranes or skin necrosis: No Has patient had a PCN reaction that required hospitalization No Has patient had a PCN reaction occurring within the last 10 years: Yes If all of the above answers are "NO", then may proceed with Cephalosporin use.    Verapamil    Aloe Hives  and Rash   Amlodipine Hives, Rash and Hypertension   Cephalexin Rash   Metoprolol Hives, Palpitations and Hypertension    Succinate   Miconazole Swelling and Rash   Neosporin [Neomycin-Bacitracin Zn-Polymyx] Rash   Trandolapril-Verapamil Hcl Er Rash    Medications: I have reviewed the patient's current medications.  Results for orders placed or performed during the hospital encounter of 07/07/21 (from the past 48 hour(s))  TSH     Status: None   Collection Time: 07/07/21  1:16 AM  Result Value Ref Range   TSH 0.596 0.350 - 4.500 uIU/mL    Comment: Performed by a 3rd Generation assay with a functional sensitivity of <=0.01 uIU/mL. Performed at [facility], [place] [address], [place], [place] [address]   CBC with Differential/Platelet     Status: Abnormal   Collection Time: 07/07/21  1:26 AM  Result Value Ref Range   WBC 17.6 (H) 4.0 - 10.5 K/uL   RBC 4.99 3.87 - 5.11 MIL/uL   Hemoglobin 14.8 12.0 - 15.0 g/dL   HCT 44.0 36.0 - 46.0 %   MCV 88.2 80.0 - 100.0 fL   MCH 29.7 26.0 - 34.0 pg   MCHC 33.6 30.0 - 36.0 g/dL   RDW 13.1 11.5 - 15.5 %   Platelets 220 150 - 400 K/uL   nRBC 0.0 0.0 - 0.2 %   Neutrophils Relative % 94 %   Band Neutrophils 0 %   Lymphocytes Relative 3 %   Monocytes Relative 3 %   Eosinophils Relative 0 %   Basophils Relative 0 %   WBC Morphology NORMAL    RBC Morphology NORMAL    Smear Review DONE    nRBC NONE SEEN 0 /100 WBC   Metamyelocytes Relative NONE SEEN %   Myelocytes NONE SEEN %   Promyelocytes Relative NONE SEEN %   Blasts NONE SEEN %    Comment: Performed at [facility], [place] [address] [place], [place] [address]  Comprehensive metabolic panel     Status: Abnormal   Collection Time: 07/07/21  1:26 AM  Result Value Ref Range   Sodium 130 (L) 135 - 145 mmol/L   Potassium 4.9 3.5 - 5.1 mmol/L   Chloride 97 (L) 98 - 111 mmol/L   CO2 24 22 - 32 mmol/L   Glucose, Bld 186 (H) 70 - 99 mg/dL    Comment:  Glucose reference range applies only to samples taken after fasting for at least 8 hours.   BUN 24 (H) 8 - 23 mg/dL   Creatinine, Ser 0.63 0.44 - 1.00 mg/dL   Calcium 9.4 8.9 - 10.3 mg/dL   Total Protein 7.0 6.5 - 8.1 g/dL  Albumin 4.0 3.5 - 5.0 g/dL   AST 52 (H) 15 - 41 U/L   ALT 25 0 - 44 U/L   Alkaline Phosphatase 105 38 - 126 U/L   Total Bilirubin 1.0 0.3 - 1.2 mg/dL   GFR, Estimated >60 >60 mL/min    Comment: (NOTE) Calculated using the CKD-EPI Creatinine Equation (2021)    Anion gap 9 5 - 15    Comment: Performed at [facility], [place] [address] [place], [place] [address]  Lipase, blood     Status: Abnormal   Collection Time: 07/07/21  1:26 AM  Result Value Ref Range   Lipase 68 (H) 11 - 51 U/L    Comment: Performed at [facility], [place] [address] [place], [place] [address]  POC occult blood, ED     Status: Abnormal   Collection Time: 07/07/21  1:57 AM  Result Value Ref Range   Fecal Occult Bld POSITIVE (A) NEGATIVE  ABO/Rh     Status: None   Collection Time: 07/07/21  2:25 AM  Result Value Ref Range   ABO/RH(D)      B POS Performed at [facility], [place] [address], [place], [place] [address]   Urinalysis, Routine w reflex microscopic Urine, In & Out Cath     Status: Abnormal   Collection Time: 07/07/21  2:50 AM  Result Value Ref Range   Color, Urine AMBER (A) YELLOW    Comment: BIOCHEMICALS MAY BE AFFECTED BY COLOR   APPearance CLOUDY (A) CLEAR   Specific Gravity, Urine 1.018 1.005 - 1.030   pH 5.0 5.0 - 8.0   Glucose, UA NEGATIVE NEGATIVE mg/dL   Hgb urine dipstick NEGATIVE NEGATIVE   Bilirubin Urine NEGATIVE NEGATIVE   Ketones, ur NEGATIVE NEGATIVE mg/dL   Protein, ur 100 (A) NEGATIVE mg/dL   Nitrite NEGATIVE NEGATIVE   Leukocytes,Ua LARGE (A) NEGATIVE   RBC / HPF 11-20 0 - 5 RBC/hpf   WBC, UA >50 (H) 0 - 5 WBC/hpf   Bacteria, UA MANY (A) NONE SEEN   Squamous Epithelial / LPF 0-5 0 - 5   WBC Clumps  PRESENT    Mucus PRESENT    Hyaline Casts, UA PRESENT    Amorphous Crystal PRESENT     Comment: Performed at [facility], [place] [address], [place], [place] [address]  Urine Culture     Status: Abnormal (Preliminary result)   Collection Time: 07/07/21  2:50 AM   Specimen: Urine, Clean Catch  Result Value Ref Range   Specimen Description      URINE, CLEAN CATCH Performed at [facility], [place] [address], [place], [place] [address]    Special Requests      NONE Performed at [facility], [place] [address] [place], [place] [address]    Culture (A)     >=100,000 COLONIES/mL GRAM NEGATIVE RODS SUSCEPTIBILITIES TO FOLLOW Performed at [facility], [place] [address] [place], [place] [address]    Report Status PENDING   Type and screen Ordered by PROVIDER DEFAULT     Status: None   Collection Time: 07/07/21  3:39 AM  Result Value Ref Range   ABO/RH(D) B POS    Antibody Screen NEG    Sample Expiration      07/10/2021,2359 Performed at [facility], [place] [address], [place], [place] [address]   Protime-INR     Status: None   Collection Time: 07/07/21  7:32  AM  Result Value Ref Range   Prothrombin Time 12.8 11.4 - 15.2 seconds   INR 1.0 0.8 - 1.2    Comment: (NOTE) INR goal varies based on device and disease states. Performed at [facility], [place] [address], [place], [place] [address]   Resp Panel by RT-PCR (Flu A&B, Covid) Nasopharyngeal Swab     Status: None   Collection Time: 07/07/21  7:42 AM   Specimen: Nasopharyngeal Swab; Nasopharyngeal(NP) swabs in vial transport medium  Result Value Ref Range   SARS Coronavirus 2 by RT PCR NEGATIVE NEGATIVE    Comment: (NOTE) SARS-CoV-2 target nucleic acids are NOT DETECTED.  The SARS-CoV-2 RNA is generally detectable in upper respiratory specimens during the acute phase of infection. The lowest concentration of SARS-CoV-2 viral copies this  assay can detect is 138 copies/mL. A negative result does not preclude SARS-Cov-2 infection and should not be used as the sole basis for treatment or other patient management decisions. A negative result may occur with  improper specimen collection/handling, submission of specimen other than nasopharyngeal swab, presence of viral mutation(s) within the areas targeted by this assay, and inadequate number of viral copies(<138 copies/mL). A negative result must be combined with clinical observations, patient history, and epidemiological information. The expected result is Negative.  Fact Sheet for Patients:  EntrepreneurPulse.com.au  Fact Sheet for Healthcare Providers:  IncredibleEmployment.be  This test is no t yet approved or cleared by the [place] FDA and  has been authorized for detection and/or diagnosis of SARS-CoV-2 by FDA under an Emergency Use Authorization (EUA). This EUA will remain  in effect (meaning this test can be used) for the duration of the COVID-19 declaration under Section 564(b)(1) of the Act, 21 U.S.C.section 360bbb-3(b)(1), unless the authorization is terminated  or revoked sooner.       Influenza A by PCR NEGATIVE NEGATIVE   Influenza B by PCR NEGATIVE NEGATIVE    Comment: (NOTE) The Xpert Xpress SARS-CoV-2/FLU/RSV plus assay is intended as an aid in the diagnosis of influenza from Nasopharyngeal swab specimens and should not be used as a sole basis for treatment. Nasal washings and aspirates are unacceptable for Xpert Xpress SARS-CoV-2/FLU/RSV testing.  Fact Sheet for Patients: EntrepreneurPulse.com.au  Fact Sheet for Healthcare Providers: IncredibleEmployment.be  This test is not yet approved or cleared by the [place] FDA and has been authorized for detection and/or diagnosis of SARS-CoV-2 by FDA under an Emergency Use Authorization (EUA). This EUA will remain in  effect (meaning this test can be used) for the duration of the COVID-19 declaration under Section 564(b)(1) of the Act, 21 U.S.C. section 360bbb-3(b)(1), unless the authorization is terminated or revoked.  Performed at [facility], [place] [address], [place], [place] [address]   CBG monitoring, ED     Status: Abnormal   Collection Time: 07/07/21 12:05 PM  Result Value Ref Range   Glucose-Capillary 146 (H) 70 - 99 mg/dL    Comment: Glucose reference range applies only to samples taken after fasting for at least 8 hours.  Glucose, capillary     Status: Abnormal   Collection Time: 07/07/21  6:32 PM  Result Value Ref Range   Glucose-Capillary 193 (H) 70 - 99 mg/dL    Comment: Glucose reference range applies only to samples taken after fasting for at least 8 hours.  Hemoglobin     Status: None   Collection Time: 07/07/21  8:16 PM  Result Value Ref Range   Hemoglobin 13.0 12.0 -  15.0 g/dL    Comment: Performed at [facility], [place] [address] [place], [place] [address]  Glucose, capillary     Status: Abnormal   Collection Time: 07/08/21 12:00 AM  Result Value Ref Range   Glucose-Capillary 177 (H) 70 - 99 mg/dL    Comment: Glucose reference range applies only to samples taken after fasting for at least 8 hours.  Glucose, capillary     Status: Abnormal   Collection Time: 07/08/21  5:14 AM  Result Value Ref Range   Glucose-Capillary 144 (H) 70 - 99 mg/dL    Comment: Glucose reference range applies only to samples taken after fasting for at least 8 hours.  Comprehensive metabolic panel     Status: Abnormal   Collection Time: 07/08/21  5:36 AM  Result Value Ref Range   Sodium 131 (L) 135 - 145 mmol/L   Potassium 3.5 3.5 - 5.1 mmol/L    Comment: DELTA CHECK NOTED   Chloride 98 98 - 111 mmol/L   CO2 25 22 - 32 mmol/L   Glucose, Bld 136 (H) 70 - 99 mg/dL    Comment: Glucose reference range applies only to samples taken after fasting for at least 8  hours.   BUN 26 (H) 8 - 23 mg/dL   Creatinine, Ser 0.80 0.44 - 1.00 mg/dL   Calcium 9.3 8.9 - 10.3 mg/dL   Total Protein 5.9 (L) 6.5 - 8.1 g/dL   Albumin 3.3 (L) 3.5 - 5.0 g/dL   AST 30 15 - 41 U/L   ALT 19 0 - 44 U/L   Alkaline Phosphatase 74 38 - 126 U/L   Total Bilirubin 0.6 0.3 - 1.2 mg/dL   GFR, Estimated >60 >60 mL/min    Comment: (NOTE) Calculated using the CKD-EPI Creatinine Equation (2021)    Anion gap 8 5 - 15    Comment: Performed at [facility], [place] [address], [place], [place] [address]  CBC     Status: Abnormal   Collection Time: 07/08/21  5:36 AM  Result Value Ref Range   WBC 19.6 (H) 4.0 - 10.5 K/uL   RBC 4.17 3.87 - 5.11 MIL/uL   Hemoglobin 12.3 12.0 - 15.0 g/dL   HCT 37.9 36.0 - 46.0 %   MCV 90.9 80.0 - 100.0 fL   MCH 29.5 26.0 - 34.0 pg   MCHC 32.5 30.0 - 36.0 g/dL   RDW 13.5 11.5 - 15.5 %   Platelets 169 150 - 400 K/uL   nRBC 0.0 0.0 - 0.2 %    Comment: Performed at [facility], [place] [address], [place], [place] [address]  Hemoglobin A1c     Status: None   Collection Time: 07/08/21  5:36 AM  Result Value Ref Range   Hgb A1c MFr Bld 5.6 4.8 - 5.6 %    Comment: (NOTE) Pre diabetes:          5.7%-6.4%  Diabetes:              >6.4%  Glycemic control for   <7.0% adults with diabetes    Mean Plasma Glucose 114.02 mg/dL    Comment: Performed at [place] [address], [place], [place] [address]  Lipase, blood     Status: Abnormal   Collection Time: 07/08/21  5:36 AM  Result Value Ref Range   Lipase 53 (H) 11 - 51 U/L    Comment: Performed at [facility], [place] [address]  [place],  [address]  Glucose, capillary     Status: Abnormal   Collection Time: 07/08/21 11:50 AM  Result Value Ref Range   Glucose-Capillary 146 (H) 70 - 99 mg/dL    Comment: Glucose reference range applies only to samples taken after fasting for at least 8 hours.    CT ANGIO GI BLEED  Result Date:  07/07/2021 CLINICAL DATA:  87 year old female with bright red blood per rectum. EXAM: CTA ABDOMEN AND PELVIS WITHOUT AND WITH CONTRAST TECHNIQUE: Multidetector CT imaging of the abdomen and pelvis was performed using the standard protocol during bolus administration of intravenous contrast. Multiplanar reconstructed images and MIPs were obtained and reviewed to evaluate the vascular anatomy. CONTRAST:  168m OMNIPAQUE IOHEXOL 350 MG/ML SOLN COMPARISON:  CT scan of the chest 05/07/2021; MRI of the thoracic spine 05/29/2021; CTA abdomen 10/18/2010 FINDINGS: VASCULAR Aorta: Calcified atherosclerotic plaque throughout the abdominal aorta without evidence of aneurysm or dissection. Celiac: Patent without evidence of aneurysm, dissection, vasculitis or significant stenosis. SMA: Patent without evidence of aneurysm, dissection, vasculitis or significant stenosis. Renals: Solitary right renal artery. Mild atherosclerotic plaque without significant appearing stenosis. On the left, there is a small accessory artery to the lower pole of the left kidney. The dominant renal artery demonstrates mixed fibrofatty and calcified atherosclerotic plaque resulting in mild to moderate stenosis. IMA: Patent without evidence of aneurysm, dissection, vasculitis or significant stenosis. Inflow: Patent without evidence of aneurysm, dissection, vasculitis or significant stenosis. Proximal Outflow: Bilateral common femoral and visualized portions of the superficial and profunda femoral arteries are patent without evidence of aneurysm, dissection, vasculitis or significant stenosis. Veins: No focal venous abnormality. Review of the MIP images confirms the above findings. NON-VASCULAR Lower chest: Moderate right and small left layering pleural effusions with associated bilateral lower lobe atelectasis. There is a large sliding hiatal hernia. Cardiomegaly with biatrial enlargement. Calcifications visualized along the coronary arteries. No  pericardial effusion. Hepatobiliary: Normal hepatic contour and morphology. No discrete hepatic lesion. No intra or extrahepatic biliary ductal dilatation. The gallbladder is abnormal in appearance being slightly distended and with diffuse mild gallbladder wall thickening, particularly around the neck and cystic duct. No radiopaque stones identified by CT. Pancreas: Unremarkable. No pancreatic ductal dilatation or surrounding inflammatory changes. Spleen: Normal in size without focal abnormality. Adrenals/Urinary Tract: No evidence of hydronephrosis. Punctate stones present within the bilateral upper and lower pole collecting systems. No evidence of stone along the ureters or within the bladder. No abnormal enhancing renal mass. Stomach/Bowel: No evidence of active arterial phase bleeding within the gastrointestinal tract at this time. No focal bowel wall thickening or evidence of obstruction. There are a few diverticula along the sigmoid colon without evidence of active inflammation. Normal appendix. Lymphatic: No suspicious lymphadenopathy. Reproductive: Uterus and bilateral adnexa are unremarkable for age. Other: No abdominal wall hernia or abnormality. No abdominopelvic ascites. Musculoskeletal: T11 compression fracture status post cement augmentation. Multilevel degenerative changes. No acute fracture or malalignment. IMPRESSION: VASCULAR 1. No evidence of active GI bleeding at this time. 2.  Aortic Atherosclerosis (ICD10-170.0). 3. Mild to moderate stenosis of the origin of the main left renal artery. NON-VASCULAR 1. Abnormal appearance of the gallbladder which is distended and diffusely edematous, particularly around the neck and cystic duct. In the appropriate clinical setting, these findings could be consistent with acute cholecystitis. Consider further evaluation with right upper quadrant ultrasound. 2. Moderately large sliding hiatal hernia. 3. Cardiomegaly with biatrial enlargement. 4. Moderate right and  small left layering pleural effusions. 5. Mild sigmoid  colonic diverticulosis without evidence of active inflammation. 6. T11 compression fracture status post cement augmentation. Electronically Signed   By: Jacqulynn Cadet M.D.   On: 07/07/2021 06:50   US Abdomen Limited RUQ (LIVER/GB)  Result Date: 07/07/2021 CLINICAL DATA:  An 87 year old female presents with gallbladder distension and RIGHT upper quadrant pain. EXAM: ULTRASOUND ABDOMEN LIMITED RIGHT UPPER QUADRANT COMPARISON:  CT angiography of July 07, 2021. FINDINGS: Gallbladder: The gallbladder is distended. Wall is thickened to 5 mm. No reported tenderness over the gallbladder during the examination by the sonographer. Small amount of pericholecystic fluid. No gallstones are visualized. Adjacent bowel appears thickened by ultrasound collapsed on previous CT. Common bile duct: Diameter: 3 mm Liver: Limited assessment due to patient motion and breathing in general with no focal abnormality exhibited in the liver, mild to moderate parenchymal heterogeneity. Portal vein is patent on color Doppler imaging with normal direction of blood flow towards the liver. Other: None. IMPRESSION: Findings that raise the question of acalculus cholecystitis. No reported tenderness is noted over the gallbladder. Would suggest HIDA scan for added specificity. Constellation of findings could also be seen in the setting of volume overload or heart failure or ongoing hepatitis. Question of bowel thickening adjacent to the gallbladder in this patient with reported GI bleeding. Correlate with signs of colitis. Electronically Signed   By: Zetta Bills M.D.   On: 07/07/2021 08:42    Review of Systems  Constitutional:  Negative for chills and fever.  HENT:  Negative for ear discharge, hearing loss and sore throat.   Eyes:  Negative for discharge.  Respiratory:  Negative for cough and shortness of breath.   Cardiovascular:  Negative for chest pain and leg swelling.   Gastrointestinal:  Positive for abdominal pain, nausea and vomiting. Negative for constipation and diarrhea.  Musculoskeletal:  Negative for myalgias and neck pain.  Skin:  Negative for rash.  Allergic/Immunologic: Negative for environmental allergies.  Neurological:  Negative for dizziness and seizures.  Hematological:  Does not bruise/bleed easily.  Psychiatric/Behavioral:  Negative for suicidal ideas.   All other systems reviewed and are negative. Blood pressure (!) 159/84, pulse 95, temperature (!) 97.3 F (36.3 C), temperature source Oral, resp. rate 18, height 5' 2"  (1.575 m), weight 59.9 kg, SpO2 94 %. Physical Exam Constitutional:      Appearance: She is well-developed.     Comments: Conversant No acute distress  HENT:     Head: Normocephalic and atraumatic.  Eyes:     General: Lids are normal. No scleral icterus.    Pupils: Pupils are equal, round, and reactive to light.     Comments: Pupils are equal round and reactive No lid lag Moist conjunctiva  Neck:     Thyroid: No thyromegaly.     Trachea: No tracheal tenderness.     Comments: No cervical lymphadenopathy Cardiovascular:     Rate and Rhythm: Normal rate and regular rhythm.     Heart sounds: No murmur heard. Pulmonary:     Effort: Pulmonary effort is normal.     Breath sounds: Normal breath sounds. No wheezing or rales.  Abdominal:     General: There is no distension.     Tenderness: There is abdominal tenderness (ruq and suprapubic). There is no guarding or rebound.     Hernia: No hernia is present.  Musculoskeletal:     Cervical back: Normal range of motion and neck supple.  Skin:    General: Skin is warm.     Findings: No  rash.     Nails: There is no clubbing.     Comments: Normal skin turgor  Neurological:     Mental Status: She is alert and oriented to person, place, and time.     Comments: Normal gait and station  Psychiatric:        Mood and Affect: Mood normal.        Thought Content: Thought  content normal.        Judgment: Judgment normal.     Comments: Appropriate affect    Assessment/Plan: 87 year old female with history of abdominal pain nausea vomiting, questionable cholecystitis A. Fib H/o CVA Hypertension Hypothyroidism Diabetes UTI  1.  Recommend HIDA scan as per ultrasound report.  At this point patient is high risk surgical candidate secondary to her history of A. fib, history of CVA, diabetes, elderly.  If HIDA scan does show that she has signs consistent with cholecystitis would recommend IR cholecystostomy tube.  Agree with broad spectrum antibiotics at this time until HIDA results. 2.  We will follow along.  Ralene Ok 07/08/2021, 12:38 PM

## 2021-07-09 ENCOUNTER — Inpatient Hospital Stay (HOSPITAL_COMMUNITY): Payer: Medicare Other

## 2021-07-09 ENCOUNTER — Inpatient Hospital Stay (HOSPITAL_COMMUNITY): Payer: Medicare Other | Admitting: Certified Registered Nurse Anesthetist

## 2021-07-09 ENCOUNTER — Encounter (HOSPITAL_COMMUNITY)
Admission: EM | Disposition: A | Discharge status: Hospice | Payer: Self-pay | Source: Skilled Nursing Facility | Attending: Family Medicine

## 2021-07-09 ENCOUNTER — Encounter (HOSPITAL_COMMUNITY): Payer: Self-pay

## 2021-07-09 DIAGNOSIS — N3000 Acute cystitis without hematuria: Secondary | ICD-10-CM | POA: Diagnosis not present

## 2021-07-09 DIAGNOSIS — A419 Sepsis, unspecified organism: Secondary | ICD-10-CM

## 2021-07-09 DIAGNOSIS — N179 Acute kidney failure, unspecified: Secondary | ICD-10-CM

## 2021-07-09 DIAGNOSIS — R6520 Severe sepsis without septic shock: Secondary | ICD-10-CM | POA: Diagnosis not present

## 2021-07-09 HISTORY — PX: LAPAROTOMY: SHX154

## 2021-07-09 HISTORY — PX: LAPAROSCOPY: SHX197

## 2021-07-09 LAB — CBC WITH DIFFERENTIAL/PLATELET
Abs Immature Granulocytes: 0.17 10*3/uL — ABNORMAL HIGH (ref 0.00–0.07)
Basophils Absolute: 0.1 10*3/uL (ref 0.0–0.1)
Basophils Relative: 1 %
Eosinophils Absolute: 0 10*3/uL (ref 0.0–0.5)
Eosinophils Relative: 0 %
HCT: 35.3 % — ABNORMAL LOW (ref 36.0–46.0)
Hemoglobin: 11.4 g/dL — ABNORMAL LOW (ref 12.0–15.0)
Immature Granulocytes: 1 %
Lymphocytes Relative: 2 %
Lymphs Abs: 0.4 10*3/uL — ABNORMAL LOW (ref 0.7–4.0)
MCH: 29.9 pg (ref 26.0–34.0)
MCHC: 32.3 g/dL (ref 30.0–36.0)
MCV: 92.7 fL (ref 80.0–100.0)
Monocytes Absolute: 1.9 10*3/uL — ABNORMAL HIGH (ref 0.1–1.0)
Monocytes Relative: 9 %
Neutro Abs: 19 10*3/uL — ABNORMAL HIGH (ref 1.7–7.7)
Neutrophils Relative %: 87 %
Platelets: 145 10*3/uL — ABNORMAL LOW (ref 150–400)
RBC: 3.81 MIL/uL — ABNORMAL LOW (ref 3.87–5.11)
RDW: 13.9 % (ref 11.5–15.5)
WBC: 21.6 10*3/uL — ABNORMAL HIGH (ref 4.0–10.5)
nRBC: 0 % (ref 0.0–0.2)

## 2021-07-09 LAB — GLUCOSE, CAPILLARY
Glucose-Capillary: 102 mg/dL — ABNORMAL HIGH (ref 70–99)
Glucose-Capillary: 118 mg/dL — ABNORMAL HIGH (ref 70–99)

## 2021-07-09 LAB — BASIC METABOLIC PANEL
Anion gap: 9 (ref 5–15)
BUN: 43 mg/dL — ABNORMAL HIGH (ref 8–23)
CO2: 24 mmol/L (ref 22–32)
Calcium: 9.4 mg/dL (ref 8.9–10.3)
Chloride: 100 mmol/L (ref 98–111)
Creatinine, Ser: 1.5 mg/dL — ABNORMAL HIGH (ref 0.44–1.00)
GFR, Estimated: 34 mL/min — ABNORMAL LOW (ref >60)
Glucose, Bld: 122 mg/dL — ABNORMAL HIGH (ref 70–99)
Potassium: 4 mmol/L (ref 3.5–5.1)
Sodium: 133 mmol/L — ABNORMAL LOW (ref 135–145)

## 2021-07-09 LAB — COMPREHENSIVE METABOLIC PANEL
ALT: 15 U/L (ref 0–44)
AST: 30 U/L (ref 15–41)
Albumin: 2.9 g/dL — ABNORMAL LOW (ref 3.5–5.0)
Alkaline Phosphatase: 61 U/L (ref 38–126)
Anion gap: 6 (ref 5–15)
BUN: 39 mg/dL — ABNORMAL HIGH (ref 8–23)
CO2: 26 mmol/L (ref 22–32)
Calcium: 9.1 mg/dL (ref 8.9–10.3)
Chloride: 101 mmol/L (ref 98–111)
Creatinine, Ser: 1.19 mg/dL — ABNORMAL HIGH (ref 0.44–1.00)
GFR, Estimated: 45 mL/min — ABNORMAL LOW (ref >60)
Glucose, Bld: 97 mg/dL (ref 70–99)
Potassium: 4.1 mmol/L (ref 3.5–5.1)
Sodium: 133 mmol/L — ABNORMAL LOW (ref 135–145)
Total Bilirubin: 0.5 mg/dL (ref 0.3–1.2)
Total Protein: 5.6 g/dL — ABNORMAL LOW (ref 6.5–8.1)

## 2021-07-09 LAB — LACTIC ACID, PLASMA: Lactic Acid, Venous: 2.2 mmol/L (ref 0.5–1.9)

## 2021-07-09 LAB — URINE CULTURE: Culture: >=100000 — AB

## 2021-07-09 LAB — POCT I-STAT 7, (LYTES, BLD GAS, ICA,H+H)
Acid-base deficit: 2 mmol/L (ref 0.0–2.0)
Bicarbonate: 22.8 mmol/L (ref 20.0–28.0)
Calcium, Ion: 1.21 mmol/L (ref 1.15–1.40)
HCT: 24 % — ABNORMAL LOW (ref 36.0–46.0)
Hemoglobin: 8.2 g/dL — ABNORMAL LOW (ref 12.0–15.0)
O2 Saturation: 100 %
Potassium: 3.2 mmol/L — ABNORMAL LOW (ref 3.5–5.1)
Sodium: 139 mmol/L (ref 135–145)
TCO2: 24 mmol/L (ref 22–32)
pCO2 arterial: 37.1 mmHg (ref 32.0–48.0)
pH, Arterial: 7.396 (ref 7.350–7.450)
pO2, Arterial: 446 mmHg — ABNORMAL HIGH (ref 83.0–108.0)

## 2021-07-09 LAB — LIPASE, BLOOD: Lipase: 26 U/L (ref 11–51)

## 2021-07-09 SURGERY — LAPAROSCOPY, DIAGNOSTIC
Anesthesia: General | Site: Abdomen

## 2021-07-09 MED ORDER — FENTANYL CITRATE (PF) 100 MCG/2ML IJ SOLN
INTRAMUSCULAR | Status: AC
  Filled 2021-07-09: qty 2

## 2021-07-09 MED ORDER — METOPROLOL TARTRATE 5 MG/5ML IV SOLN
INTRAVENOUS | Status: AC
  Filled 2021-07-09: qty 5

## 2021-07-09 MED ORDER — METOPROLOL TARTRATE 5 MG/5ML IV SOLN
INTRAVENOUS | Status: DC | PRN
  Administered 2021-07-09: 2 mg via INTRAVENOUS
  Administered 2021-07-09: 1 mg via INTRAVENOUS
  Administered 2021-07-09: 3 mg via INTRAVENOUS
  Administered 2021-07-09 (×2): 2 mg via INTRAVENOUS

## 2021-07-09 MED ORDER — CHLORHEXIDINE GLUCONATE CLOTH 2 % EX PADS
6.0000 | MEDICATED_PAD | Freq: Every day | CUTANEOUS | Status: DC
  Administered 2021-07-11 – 2021-07-15 (×5): 6 via TOPICAL

## 2021-07-09 MED ORDER — ONDANSETRON HCL 4 MG/2ML IJ SOLN: 4.0000 mg | Freq: Once | INTRAMUSCULAR | Status: DC | PRN

## 2021-07-09 MED ORDER — TECHNETIUM TC 99M MEBROFENIN IV KIT
5.4000 | PACK | Freq: Once | INTRAVENOUS | Status: AC | PRN
  Administered 2021-07-09: 5.4 via INTRAVENOUS

## 2021-07-09 MED ORDER — PHENYLEPHRINE HCL-NACL 20-0.9 MG/250ML-% IV SOLN
INTRAVENOUS | Status: DC | PRN
  Administered 2021-07-09: 100 ug/min via INTRAVENOUS

## 2021-07-09 MED ORDER — SUGAMMADEX SODIUM 200 MG/2ML IV SOLN
INTRAVENOUS | Status: DC | PRN
  Administered 2021-07-09: 100 mg via INTRAVENOUS
  Administered 2021-07-09: 200 mg via INTRAVENOUS

## 2021-07-09 MED ORDER — 0.9 % SODIUM CHLORIDE (POUR BTL) OPTIME
TOPICAL | Status: DC | PRN
  Administered 2021-07-09: 2000 mL
  Administered 2021-07-09: 1000 mL

## 2021-07-09 MED ORDER — PROPOFOL 10 MG/ML IV BOLUS
INTRAVENOUS | Status: DC | PRN
  Administered 2021-07-09: 70 mg via INTRAVENOUS

## 2021-07-09 MED ORDER — ACETAMINOPHEN 10 MG/ML IV SOLN
INTRAVENOUS | Status: DC | PRN
  Administered 2021-07-09: 1000 mg via INTRAVENOUS

## 2021-07-09 MED ORDER — PHENYLEPHRINE 40 MCG/ML (10ML) SYRINGE FOR IV PUSH (FOR BLOOD PRESSURE SUPPORT)
PREFILLED_SYRINGE | INTRAVENOUS | Status: DC | PRN
  Administered 2021-07-09: 40 ug via INTRAVENOUS

## 2021-07-09 MED ORDER — ROCURONIUM BROMIDE 10 MG/ML (PF) SYRINGE
PREFILLED_SYRINGE | INTRAVENOUS | Status: DC | PRN
  Administered 2021-07-09: 70 mg via INTRAVENOUS

## 2021-07-09 MED ORDER — SUCCINYLCHOLINE CHLORIDE 200 MG/10ML IV SOSY
PREFILLED_SYRINGE | INTRAVENOUS | Status: AC
  Filled 2021-07-09: qty 10

## 2021-07-09 MED ORDER — DEXMEDETOMIDINE (PRECEDEX) IN NS 20 MCG/5ML (4 MCG/ML) IV SYRINGE
PREFILLED_SYRINGE | INTRAVENOUS | Status: DC | PRN
  Administered 2021-07-09: 8 ug via INTRAVENOUS

## 2021-07-09 MED ORDER — FENTANYL CITRATE PF 50 MCG/ML IJ SOSY
PREFILLED_SYRINGE | INTRAMUSCULAR | Status: AC
  Filled 2021-07-09: qty 1

## 2021-07-09 MED ORDER — METOPROLOL TARTRATE 5 MG/5ML IV SOLN
5.0000 mg | Freq: Four times a day (QID) | INTRAVENOUS | Status: DC
  Administered 2021-07-10 – 2021-07-13 (×11): 5 mg via INTRAVENOUS
  Filled 2021-07-09 (×11): qty 5

## 2021-07-09 MED ORDER — LACTATED RINGERS IV SOLN
INTRAVENOUS | Status: DC
Start: 2021-07-09 — End: 2021-07-10
  Administered 2021-07-10: 100 mL/h via INTRAVENOUS

## 2021-07-09 MED ORDER — FENTANYL CITRATE PF 50 MCG/ML IJ SOSY
25.0000 ug | PREFILLED_SYRINGE | INTRAMUSCULAR | Status: DC | PRN
  Administered 2021-07-09: 50 ug via INTRAVENOUS
  Administered 2021-07-09 (×2): 25 ug via INTRAVENOUS

## 2021-07-09 MED ORDER — ROCURONIUM BROMIDE 10 MG/ML (PF) SYRINGE
PREFILLED_SYRINGE | INTRAVENOUS | Status: AC
  Filled 2021-07-09: qty 10

## 2021-07-09 MED ORDER — LACTATED RINGERS IV BOLUS
500.0000 mL | Freq: Once | INTRAVENOUS | Status: AC
  Administered 2021-07-09: 500 mL via INTRAVENOUS

## 2021-07-09 MED ORDER — BUPIVACAINE-EPINEPHRINE 0.25% -1:200000 IJ SOLN
INTRAMUSCULAR | Status: DC | PRN
  Administered 2021-07-09: 30 mL

## 2021-07-09 MED ORDER — DEXAMETHASONE SODIUM PHOSPHATE 10 MG/ML IJ SOLN
INTRAMUSCULAR | Status: AC
  Filled 2021-07-09: qty 1

## 2021-07-09 MED ORDER — ONDANSETRON HCL 4 MG/2ML IJ SOLN
INTRAMUSCULAR | Status: AC
  Filled 2021-07-09: qty 2

## 2021-07-09 MED ORDER — PROPOFOL 10 MG/ML IV BOLUS
INTRAVENOUS | Status: AC
  Filled 2021-07-09: qty 20

## 2021-07-09 MED ORDER — PHENYLEPHRINE HCL (PRESSORS) 10 MG/ML IV SOLN
INTRAVENOUS | Status: AC
  Filled 2021-07-09: qty 2

## 2021-07-09 MED ORDER — LIDOCAINE 2% (20 MG/ML) 5 ML SYRINGE
INTRAMUSCULAR | Status: DC | PRN
  Administered 2021-07-09: 50 mg via INTRAVENOUS

## 2021-07-09 MED ORDER — FENTANYL CITRATE (PF) 100 MCG/2ML IJ SOLN
INTRAMUSCULAR | Status: DC | PRN
  Administered 2021-07-09 (×2): 50 ug via INTRAVENOUS

## 2021-07-09 MED ORDER — BUPIVACAINE-EPINEPHRINE (PF) 0.25% -1:200000 IJ SOLN
INTRAMUSCULAR | Status: AC
  Filled 2021-07-09: qty 30

## 2021-07-09 MED ORDER — ONDANSETRON HCL 4 MG/2ML IJ SOLN
INTRAMUSCULAR | Status: DC | PRN
  Administered 2021-07-09: 4 mg via INTRAVENOUS

## 2021-07-09 MED ORDER — DEXAMETHASONE SODIUM PHOSPHATE 10 MG/ML IJ SOLN
INTRAMUSCULAR | Status: DC | PRN
  Administered 2021-07-09: 4 mg via INTRAVENOUS

## 2021-07-09 SURGICAL SUPPLY — 45 items
APL PRP STRL LF DISP 70% ISPRP (MISCELLANEOUS) ×2
BAG COUNTER SPONGE SURGICOUNT (BAG) IMPLANT
BAG SPNG CNTER NS LX DISP (BAG)
BNDG COHESIVE 1X5 TAN STRL LF (GAUZE/BANDAGES/DRESSINGS) ×2 IMPLANT
CHLORAPREP W/TINT 26 (MISCELLANEOUS) ×1 IMPLANT
COVER MAYO STAND STRL (DRAPES) ×2 IMPLANT
COVER SURGICAL LIGHT HANDLE (MISCELLANEOUS) ×3 IMPLANT
DRAIN CHANNEL 19F RND (DRAIN) IMPLANT
DRAPE LAPAROSCOPIC ABDOMINAL (DRAPES) ×2 IMPLANT
DRSG OPSITE POSTOP 4X10 (GAUZE/BANDAGES/DRESSINGS) IMPLANT
DRSG OPSITE POSTOP 4X6 (GAUZE/BANDAGES/DRESSINGS) IMPLANT
DRSG OPSITE POSTOP 4X8 (GAUZE/BANDAGES/DRESSINGS) ×1 IMPLANT
ELECT BLADE TIP CTD 4 INCH (ELECTRODE) ×1 IMPLANT
ELECT PENCIL ROCKER SW 15FT (MISCELLANEOUS) ×1 IMPLANT
ELECT REM PT RETURN 15FT ADLT (MISCELLANEOUS) ×3 IMPLANT
EVACUATOR SILICONE 100CC (DRAIN) IMPLANT
GLOVE SURG ENC MOIS LTX SZ6 (GLOVE) ×6 IMPLANT
GLOVE SURG UNDER LTX SZ6.5 (GLOVE) ×6 IMPLANT
GOWN STRL REUS W/TWL LRG LVL3 (GOWN DISPOSABLE) ×3 IMPLANT
GOWN STRL REUS W/TWL XL LVL3 (GOWN DISPOSABLE) ×3 IMPLANT
KIT TURNOVER KIT A (KITS) ×1 IMPLANT
LIGASURE IMPACT 36 18CM CVD LR (INSTRUMENTS) ×1 IMPLANT
PACK GENERAL/GYN (CUSTOM PROCEDURE TRAY) ×2 IMPLANT
RELOAD PROXIMATE 75MM BLUE (ENDOMECHANICALS) ×12 IMPLANT
RELOAD STAPLE 75 3.8 BLU REG (ENDOMECHANICALS) IMPLANT
SPONGE T-LAP 18X18 ~~LOC~~+RFID (SPONGE) ×1 IMPLANT
STAPLER PROXIMATE 75MM BLUE (STAPLE) ×1 IMPLANT
STAPLER VISISTAT 35W (STAPLE) ×1 IMPLANT
SUT ETHILON 3 0 PS 1 (SUTURE) IMPLANT
SUT MNCRL AB 4-0 PS2 18 (SUTURE) IMPLANT
SUT NOVA T20/GS 25 (SUTURE) IMPLANT
SUT PDS AB 2-0 CT2 27 (SUTURE) ×2 IMPLANT
SUT SILK 2 0 (SUTURE)
SUT SILK 2 0 SH CR/8 (SUTURE) ×2 IMPLANT
SUT SILK 2-0 18XBRD TIE 12 (SUTURE) ×2 IMPLANT
SUT SILK 3 0 (SUTURE) ×3
SUT SILK 3 0 SH CR/8 (SUTURE) ×2 IMPLANT
SUT SILK 3-0 18XBRD TIE 12 (SUTURE) ×2 IMPLANT
SUT VIC AB 2-0 SH 18 (SUTURE) ×2 IMPLANT
TOWEL OR 17X26 10 PK STRL BLUE (TOWEL DISPOSABLE) ×1 IMPLANT
TOWEL OR NON WOVEN STRL DISP B (DISPOSABLE) ×3 IMPLANT
TRAY FOLEY MTR SLVR 14FR STAT (SET/KITS/TRAYS/PACK) ×2 IMPLANT
TRAY FOLEY MTR SLVR 16FR STAT (SET/KITS/TRAYS/PACK) ×2 IMPLANT
TRAY LAPAROSCOPIC (CUSTOM PROCEDURE TRAY) ×1 IMPLANT
YANKAUER SUCT BULB TIP 10FT TU (MISCELLANEOUS) ×1 IMPLANT

## 2021-07-09 NOTE — Progress Notes (Signed)
[facility] Progress Note  Debbie Hampton 87 y.o. 09-26-1931   Subjective: Patient seen prior to going for HIDA. Resting in bed. Not oriented to person, place, or time.   Objective: Vital signs: Vitals:   07/08/21 2011 07/09/21 0319  BP: 125/68 136/82  Pulse: 91 100  Resp: 20 18  Temp: 99.1 F (37.3 C) 98.8 F (37.1 C)  SpO2: 94% 92%    Physical Exam: Gen: elderly, hard of hearing, no acute distress, well-nourished HEENT: anicteric sclera, poor dentition CV: RRR Chest: CTA B Abd: diffuse tenderness with guarding to light palpation, soft, nondistended, +BS Ext: no edema  Lab Results: Recent Labs    07/08/21 0536 07/09/21 0456  NA 131* 133*  K 3.5 4.1  CL 98 101  CO2 25 26  GLUCOSE 136* 97  BUN 26* 39*  CREATININE 0.80 1.19*  CALCIUM 9.3 9.1   Recent Labs    07/08/21 2031 07/09/21 0456  AST 28 30  ALT 16 15  ALKPHOS 71 61  BILITOT 0.8 0.5  PROT 5.8* 5.6*  ALBUMIN 3.1* 2.9*   Recent Labs    07/08/21 0536 07/09/21 0456  WBC 19.6* 21.6*  NEUTROABS  --  19.0*  HGB 12.3 11.4*  HCT 37.9 35.3*  MCV 90.9 92.7  PLT 169 145*      Assessment/Plan: 87 yo with question of cholecystitis receiving a HIDA scan this morning. Continue Abx, supportive care. Surgery on board. No new GI recs.   Shirley Friar 07/09/2021, 8:08 AM  Questions please call [phone] [name], female   DOB: 04-15-1932, 87 y.o.   MRN: [id number]

## 2021-07-09 NOTE — Anesthesia Postprocedure Evaluation (Signed)
Anesthesia Post Note  Patient: Debbie Hampton  Procedure(s) Performed: LAPAROSCOPY DIAGNOSTIC EXPLORATORY LAPAROTOMY WITH RESECTION OF RIGHT COLON AND ILEUM (Abdomen)     Patient location during evaluation: PACU Anesthesia Type: General Level of consciousness: awake and alert, oriented and patient cooperative Pain management: pain level controlled Vital Signs Assessment: post-procedure vital signs reviewed and stable Respiratory status: spontaneous breathing, nonlabored ventilation and respiratory function stable Cardiovascular status: blood pressure returned to baseline and stable Postop Assessment: no apparent nausea or vomiting Anesthetic complications: no Comments: For transfer to ICU   No notable events documented.  Last Vitals:  Vitals:   07/09/21 1519 07/09/21 1925  BP: 120/67 (!) 137/103  Pulse: 100   Resp: 18 16  Temp: 36.6 C (!) 36.3 C  SpO2: 90% 100%    Last Pain:  Vitals:   07/09/21 1925  TempSrc:   PainSc: Asleep                 Lannie Fields

## 2021-07-09 NOTE — Progress Notes (Addendum)
PROGRESS NOTE   Debbie Hampton  NFA:[id number]    DOB: April 19, 1932    DOA: 07/07/2021  PCP: Kandyce Rud, MD   I have briefly reviewed patients previous medical records in [facility].  Chief complaint Rectal bleeding and abdominal pain.   Brief Narrative:  87 year old female, SNF resident, extremely hard of hearing, medical history significant for chronic atrial fibrillation on Eliquis, bilateral carotid artery disease, CAD, GERD, hiatal hernia, HTN, HLD, nonhemorrhagic stroke, depression, type II DM, sent from SNF due to rectal bleeding, nausea, vomiting and abdominal pain.  Admitted for possible low volume rectal bleed likely related to hemorrhoids, main issue however is concern for acute acalculous cholecystitis, also being treated for acute cystitis and hyponatremia.  [facility] and general surgery were consulted.  Unable to get HIDA scan on the weekend.  HIDA scan today negative.  Patient with ongoing acute abdominal pain, vomiting, tachycardia, worsening leukocytosis/sepsis picture.  Concern for acute abdomen, ischemic bowel due to embolic event related to A. fib versus others.  KUB shows early or partial small bowel obstruction.  NG tube inserted by general surgery.  General surgery plan to take to OR for laparoscopic look and may be exploratory laparotomy   Assessment & Plan:  Principal Problem:   Rectal bleeding Active Problems:   Hyponatremia   Atrial fibrillation (HCC)   Hypertension   Compression fracture of T11 vertebra (HCC)   Hypothyroidism   Type 2 diabetes mellitus with hyperglycemia (HCC)   Dilated gallbladder   Acute lower UTI   Elevated lipase   Renal artery stenosis (HCC)   HLD (hyperlipidemia)   Aortic atherosclerosis (HCC)   Abdominal pain/leukocytosis/abnormal imaging by CT abdomen and RUQ ultrasound concerning for acute acalculous cholecystitis: - CTA abdomen 10/22: Gallbladder distended and diffusely edematous, particularly around the neck and cystic  duct, these findings could be consistent with acute cholecystitis. - RUQ ultrasound: Findings raise the question of a calculus cholecystitis.  HIDA scan recommended. [facility] and general surgery on board. - Unable to get HIDA scan on 10/23 despite calling the radiologist. - HIDA scan negative. - Etiology of clinical picture is still unclear.  Discussed in detail with [facility] team.  KUB shows earlier partial small bowel obstruction.  NG tube was placed by [facility] due to persistent vomiting and reportedly feculent return.  Checking lactate due to concern for ischemic bowel (given history of A. fib-holding AC due to presentation with rectal bleeding). - Is NPO, IV antibiotics were broadened yesterday to ceftriaxone and metronidazole.  Continue IV fluids.  Sepsis, not POA, likely due to abdominal source and UTI - IV fluids and antibiotics as above.  Lactate up to 2.2.  Low volume rectal bleeding - Likely related to hemorrhoids. - Per RN report, no further bleeding noted but has prolapsing hemorrhoids. - Bowel regimen and Anusol suppositories. - Holding apixaban for now.  No further bleeding reported.  E. coli acute cystitis - Urine culture confirms E. coli, sensitive to ceftriaxone. - Briefly on IV Cipro which was switched to IV ceftriaxone.  Dehydration with hyponatremia - Likely secondary to GI losses and poor oral intake. - Markedly reduced urine output based on bladder scan and only 25 mL urine output since Foley catheter placed earlier today. - We will give IV fluid bolus 500 mL x 1 followed by increased IV fluids and monitor closely.  Acute kidney injury - Likely related to sepsis and dehydration. - Creatinine increased from 0.8 > 1.19 > 1.5 in the last 36 hours. -  Aggressive IV fluid hydration.?  Oliguric. - Strict intake and output.  Acute metabolic encephalopathy - Baseline mentation not known. - Likely related to acute infectious etiology discussed above and dehydration. - Delirium  precautions.  Minimize opioids as much as possible.  Frequent reorientation.  Follow.  Chronic atrial fibrillation - Hold atenolol due to n.p.o. status.  IV metoprolol every 6 hours. - Holding apixaban in case interventions are needed. - Rate appropriately controlled now for her acute illness.  Leukocytosis - Likely related to acute cystitis and acute abdominal findings - WBC is worsened from 16-19-21.  Continue IV antibiotics.  And follow daily CBC.  Essential hypertension - History of left renal artery stenosis, medically treated. - DC atenolol and lisinopril and start IV metoprolol.  T11 compression fracture s/p intervention - No pain reported.  Hypothyroidism/multiple thyroid nodules - Follow TSH: 0.596.  Outpatient follow-up.  Type II DM with hyperglycemia, diet controlled - Monitor CBGs and consider SSI if needed.  Reasonably controlled.  Hyperlipidemia/aortic atherosclerosis - Holding statins for now.  Extremely hard of hearing - Family to bring hearing aids  Moderately large sliding hiatal hernia/GERD  Bilateral pleural effusions (right > left)  Body mass index is 24.14 kg/m.  Nutritional Status        Pressure Ulcer:     DVT prophylaxis: SCDs Start: 07/07/21 9379     Code Status: DNR Family Communication: I discussed in detail with son/HCPOA, updated critical nature of her illness, ongoing care and answered questions. Disposition:  Status is: Inpatient  Remains inpatient appropriate because: Concern for acute cholecystitis, ongoing evaluation, acute cystitis, IV fluids, IV antibiotics, consultants evaluation.        Consultants:   [facility] General [facility]  Procedures:   Foley catheter NG tube  Antimicrobials:    Anti-infectives (From admission, onward)    Start     Dose/Rate Route Frequency Ordered Stop   07/08/21 1800  cefTRIAXone (ROCEPHIN) 2 g in sodium chloride 0.9 % 100 mL IVPB        2 g 200 mL/hr over 30 Minutes Intravenous  Every 24 hours 07/08/21 1407     07/08/21 1500  metroNIDAZOLE (FLAGYL) IVPB 500 mg        500 mg 100 mL/hr over 60 Minutes Intravenous Every 12 hours 07/08/21 1407     07/07/21 0800  ciprofloxacin (CIPRO) IVPB 400 mg  Status:  Discontinued        400 mg 200 mL/hr over 60 Minutes Intravenous 2 times daily 07/07/21 0733 07/08/21 1407   07/07/21 0730  ciprofloxacin (CIPRO) IVPB 400 mg  Status:  Discontinued        400 mg 200 mL/hr over 60 Minutes Intravenous  Once 07/07/21 0727 07/07/21 0733   07/07/21 0730  metroNIDAZOLE (FLAGYL) IVPB 500 mg        500 mg 100 mL/hr over 60 Minutes Intravenous  Once 07/07/21 0240 07/07/21 1104         Subjective:  Patient seen after she returned from HIDA scan.  Had a BM.  Nurses had just finished cleaning her up.  Patient seemed worn out.  Resting but arousable.  Very difficult to understand her speech.  Also appears confused.  Talks about her gallbladder.  Objective:   Vitals:   07/08/21 2011 07/09/21 0319 07/09/21 1339 07/09/21 1519  BP: 125/68 136/82 123/76 120/67  Pulse: 91 100 (!) 104 100  Resp: 20 18 (!) 23 18  Temp: 99.1 F (37.3 C) 98.8 F (37.1 C) 98.8 F (  37.1 C) 97.9 F (36.6 C)  TempSrc: Axillary Axillary Oral   SpO2: 94% 92% 92% 90%  Weight:      Height:        General exam: Elderly female, moderately built, frail and chronically ill looking female, intermittently appears in pain. Respiratory system: Clear to auscultation.  No increased work of breathing. Cardiovascular system: S1 and S2 heard, irregularly irregular and tachycardic.  No JVD, murmurs or pedal edema.  Telemetry personally reviewed: A. fib with rates ranging between 90-1 10.  Occasional tachycardia up to 120s.  12 beat nonsustained wide-complex tachycardia noted. Gastrointestinal system: Abdomen is nondistended, mostly right upper quadrant/periumbilical area tenderness with guarding.  No rigidity or rebound.  Bowel sounds present. Central nervous system: Mental  status as noted above.  Not oriented.  Follows simple instructions. No focal neurological deficits. Extremities: Symmetric 5 x 5 power. Skin: No rashes, lesions or ulcers Psychiatry: Judgement and insight cannot be assessed at this time. Mood & affect appropriate.     Data Reviewed:   I have personally reviewed following labs and imaging studies   CBC: Recent Labs  Lab 07/07/21 0126 07/07/21 2016 07/08/21 0536 07/09/21 0456  WBC 17.6*  --  19.6* 21.6*  NEUTROABS  --   --   --  19.0*  HGB 14.8 13.0 12.3 11.4*  HCT 44.0  --  37.9 35.3*  MCV 88.2  --  90.9 92.7  PLT 220  --  169 145*    Basic Metabolic Panel: Recent Labs  Lab 07/07/21 0126 07/08/21 0536 07/09/21 0456  NA 130* 131* 133*  K 4.9 3.5 4.1  CL 97* 98 101  CO2 24 25 26   GLUCOSE 186* 136* 97  BUN 24* 26* 39*  CREATININE 0.63 0.80 1.19*  CALCIUM 9.4 9.3 9.1    Liver Function Tests: Recent Labs  Lab 07/08/21 0536 07/08/21 2031 07/09/21 0456  AST 30 28 30   ALT 19 16 15   ALKPHOS 74 71 61  BILITOT 0.6 0.8 0.5  PROT 5.9* 5.8* 5.6*  ALBUMIN 3.3* 3.1* 2.9*    CBG: Recent Labs  Lab 07/08/21 2306 07/09/21 0517 07/09/21 1141  GLUCAP 106* 102* 118*    Microbiology Studies:   Recent Results (from the past 240 hour(s))  Urine Culture     Status: Abnormal   Collection Time: 07/07/21  2:50 AM   Specimen: Urine, Clean Catch  Result Value Ref Range Status   Specimen Description   Final    URINE, CLEAN CATCH Performed at [facility], [address], [place], [place]    Special Requests   Final    NONE Performed at [facility], [address] [place], [place]    Culture >=100,000 COLONIES/mL ESCHERICHIA COLI (A)  Final   Report Status 07/09/2021 FINAL  Final   Organism ID, Bacteria ESCHERICHIA COLI (A)  Final      Susceptibility   Escherichia coli - MIC*    AMPICILLIN >=32 RESISTANT Resistant     CEFAZOLIN <=4 SENSITIVE Sensitive      CEFEPIME <=0.12 SENSITIVE Sensitive     CEFTRIAXONE <=0.25 SENSITIVE Sensitive     CIPROFLOXACIN <=0.25 SENSITIVE Sensitive     GENTAMICIN <=1 SENSITIVE Sensitive     IMIPENEM <=0.25 SENSITIVE Sensitive     NITROFURANTOIN <=16 SENSITIVE Sensitive     TRIMETH/SULFA >=320 RESISTANT Resistant     AMPICILLIN/SULBACTAM 16 INTERMEDIATE Intermediate     PIP/TAZO <=4 SENSITIVE Sensitive     * >=  100,000 COLONIES/mL ESCHERICHIA COLI  Resp Panel by RT-PCR (Flu A&B, Covid) Nasopharyngeal Swab     Status: None   Collection Time: 07/07/21  7:42 AM   Specimen: Nasopharyngeal Swab; Nasopharyngeal(NP) swabs in vial transport medium  Result Value Ref Range Status   SARS Coronavirus 2 by RT PCR NEGATIVE NEGATIVE Final    Comment: (NOTE) SARS-CoV-2 target nucleic acids are NOT DETECTED.  The SARS-CoV-2 RNA is generally detectable in upper respiratory specimens during the acute phase of infection. The lowest concentration of SARS-CoV-2 viral copies this assay can detect is 138 copies/mL. A negative result does not preclude SARS-Cov-2 infection and should not be used as the sole basis for treatment or other patient management decisions. A negative result may occur with  improper specimen collection/handling, submission of specimen other than nasopharyngeal swab, presence of viral mutation(s) within the areas targeted by this assay, and inadequate number of viral copies(<138 copies/mL). A negative result must be combined with clinical observations, patient history, and epidemiological information. The expected result is Negative.  Fact Sheet for Patients:  BloggerCourse.com  Fact Sheet for Healthcare Providers:  SeriousBroker.it  This test is no t yet approved or cleared by the [place] FDA and  has been authorized for detection and/or diagnosis of SARS-CoV-2 by FDA under an Emergency Use Authorization (EUA). This EUA will remain  in effect  (meaning this test can be used) for the duration of the COVID-19 declaration under Section 564(b)(1) of the Act, 21 U.S.C.section 360bbb-3(b)(1), unless the authorization is terminated  or revoked sooner.       Influenza A by PCR NEGATIVE NEGATIVE Final   Influenza B by PCR NEGATIVE NEGATIVE Final    Comment: (NOTE) The Xpert Xpress SARS-CoV-2/FLU/RSV plus assay is intended as an aid in the diagnosis of influenza from Nasopharyngeal swab specimens and should not be used as a sole basis for treatment. Nasal washings and aspirates are unacceptable for Xpert Xpress SARS-CoV-2/FLU/RSV testing.  Fact Sheet for Patients: BloggerCourse.com  Fact Sheet for Healthcare Providers: SeriousBroker.it  This test is not yet approved or cleared by the [place] FDA and has been authorized for detection and/or diagnosis of SARS-CoV-2 by FDA under an Emergency Use Authorization (EUA). This EUA will remain in effect (meaning this test can be used) for the duration of the COVID-19 declaration under Section 564(b)(1) of the Act, 21 U.S.C. section 360bbb-3(b)(1), unless the authorization is terminated or revoked.  Performed at [facility], [address], [place], [place] [address]      Radiology Studies:  NM Hepato W/EF  Result Date: 07/09/2021 CLINICAL DATA:  Possible cholecystitis. EXAM: NUCLEAR MEDICINE HEPATOBILIARY IMAGING WITH GALLBLADDER EF TECHNIQUE: Sequential images of the abdomen were obtained out to 60 minutes following intravenous administration of radiopharmaceutical. After oral ingestion of Ensure, gallbladder ejection fraction was determined. At 60 min, normal ejection fraction is greater than 33%. RADIOPHARMACEUTICALS:  5.4 mCi Tc-35m  Choletec IV COMPARISON:  Right upper quadrant ultrasound and CT abdomen pelvis dated July 07, 2021. FINDINGS: Prompt uptake and biliary excretion of activity by the liver is  seen. Gallbladder activity is visualized, consistent with patency of cystic duct. Biliary activity passes into small bowel, consistent with patent common bile duct. Calculated gallbladder ejection fraction is 52%. (Normal gallbladder ejection fraction with Ensure is greater than 33%.) IMPRESSION: 1. Normal HIDA scan and gallbladder ejection fraction. Electronically Signed   By: Obie Dredge M.D.   On: 07/09/2021 11:32   DG Abd Portable 1V  Result Date:  07/09/2021 CLINICAL DATA:  Abdominal pain and tenderness EXAM: PORTABLE ABDOMEN - 1 VIEW COMPARISON:  None. FINDINGS: Mildly dilated gas-filled loops of small bowel. Air seen in the rectum. No supine evidence of free air. Partially visualized lungs demonstrate and moderate hiatal hernia bibasilar atelectasis. No acute osseous abnormality. IMPRESSION: Mildly dilated gas-filled loops of small bowel with air seen in the rectum, findings can be seen in the setting of early or partial small bowel obstruction. Electronically Signed   By: Allegra Lai M.D.   On: 07/09/2021 15:14     Scheduled Meds:    atenolol  50 mg Oral BID   citalopram  20 mg Oral Daily   diclofenac Sodium  2 g Topical QID   lisinopril  10 mg Oral Daily   pantoprazole (PROTONIX) IV  40 mg Intravenous Daily    Continuous Infusions:    sodium chloride 10 mL/hr at 07/08/21 1806   cefTRIAXone (ROCEPHIN)  IV 2 g (07/08/21 1809)   lactated ringers 50 mL/hr at 07/08/21 1429   metronidazole 500 mg (07/09/21 0240)     LOS: 2 days     Marcellus Scott, MD, [place], [facility]. [facility]    To contact the attending provider between 7A-7P or the covering provider during after hours 7P-7A, please log into the web site [email or web address] and access using universal [place] password for that web site. If you do not have the password, please call the hospital operator.  07/09/2021, 3:28 PM

## 2021-07-09 NOTE — Transfer of Care (Signed)
Immediate Anesthesia Transfer of Care Note  Patient: Debbie Hampton  Procedure(s) Performed: LAPAROSCOPY DIAGNOSTIC EXPLORATORY LAPAROTOMY WITH RESECTION OF RIGHT COLON AND ILEUM (Abdomen)  Patient Location: PACU  Anesthesia Type:General  Level of Consciousness: awake and patient cooperative  Airway & Oxygen Therapy: Patient Spontanous Breathing and Patient connected to face mask oxygen  Post-op Assessment: Report given to RN and Post -op Vital signs reviewed and stable  Post vital signs: Reviewed and stable  Last Vitals:  Vitals Value Taken Time  BP 144/66 07/09/21 1930  Temp 36.3 C 07/09/21 1925  Pulse 100 07/09/21 1933  Resp 16 07/09/21 1933  SpO2 100 % 07/09/21 1933  Vitals shown include unvalidated device data.  Last Pain:  Vitals:   07/09/21 1339  TempSrc: Oral  PainSc:          Complications: No notable events documented.

## 2021-07-09 NOTE — Plan of Care (Signed)
  Problem: Coping: Goal: Level of anxiety will decrease Outcome: Progressing   Problem: Elimination: Goal: Will not experience complications related to bowel motility Outcome: Progressing Goal: Will not experience complications related to urinary retention Outcome: Progressing   Problem: Pain Managment: Goal: General experience of comfort will improve Outcome: Progressing   Problem: Skin Integrity: Goal: Risk for impaired skin integrity will decrease Outcome: Progressing   

## 2021-07-09 NOTE — Progress Notes (Signed)
I take over for call on the emergency general surgery service at 5PM, so Dr. Daphine Deutscher asked me to evaluate this patient this afternoon.     Debbie Hampton has continued to decline today.  Some acute delirium so difficult to communicate.  Severe abdominal pain with guarding and point tenderness in the right mid abdomen.  Mild dilation of a segment of small bowel on plain film.  With history of a. Fib, concern is for embolic event causing mesenteric ischemia.  May also have a perforated ulcer.  Will proceed with emergent diagnostic laparoscopy, possible exploratory laparotomy.  I will discuss the risks, benefits and alternatives of surgery with the patient's son as she is unable to provide consent at this time.  Quentin Ore, MD

## 2021-07-09 NOTE — Anesthesia Procedure Notes (Signed)
Arterial Line Insertion Start/End10/24/2022 6:25 PM, 07/09/2021 6:31 PM Performed by: Lannie Fields, DO, anesthesiologist  Patient location: OR. Preanesthetic checklist: patient identified, IV checked, site marked, risks and benefits discussed, surgical consent, monitors and equipment checked, pre-op evaluation, timeout performed and anesthesia consent Left, radial was placed Catheter size: 20 G Hand hygiene performed  and maximum sterile barriers used   Attempts: 1 Procedure performed without using ultrasound guided technique. Following insertion, dressing applied. Post procedure assessment: normal and unchanged  Patient tolerated the procedure well with no immediate complications.

## 2021-07-09 NOTE — Progress Notes (Signed)
OT Cancellation Note  Patient Details Name: Debbie Hampton MRN: [id number] DOB: 1932-07-16   Cancelled Treatment:    Reason Eval/Treat Not Completed: Patient at procedure or test/ unavailable patient is at HIDA at this time. Will check back as schedule allows.     Sharyn Blitz OTR/L, MS Acute Rehabilitation Department Office# [phone] Pager# [phone]  07/09/2021, 10:38 AM

## 2021-07-09 NOTE — Progress Notes (Addendum)
[facility] RN Report to floor given  Gave report to [employer]. [id number]. Discussed surgery, meds given in OR and [facility], VS, IV fluids given, EBL, urine output, pain and other pertinent information. Also discussed if pt had any family or friends here or belongings with them.   Discussed NGT, foley, R Rad Aline, pt trying to cough up thick secretions and her L hearing aide will come w/ pt to ICU from [facility].   Family was updated.   Pt exits my care.

## 2021-07-09 NOTE — Progress Notes (Signed)
Restless 

## 2021-07-09 NOTE — Anesthesia Procedure Notes (Signed)
Procedure Name: Intubation Date/Time: 07/09/2021 5:45 PM Performed by: Cleda Daub, CRNA Pre-anesthesia Checklist: Patient identified, Emergency Drugs available, Suction available and Patient being monitored Patient Re-evaluated:Patient Re-evaluated prior to induction Oxygen Delivery Method: Circle system utilized Preoxygenation: Pre-oxygenation with 100% oxygen Induction Type: IV induction and Rapid sequence Laryngoscope Size: Mac and 3 Grade View: Grade I Tube type: Oral Tube size: 7.0 mm Number of attempts: 1 Airway Equipment and Method: Stylet and Oral airway Placement Confirmation: ETT inserted through vocal cords under direct vision, positive ETCO2 and breath sounds checked- equal and bilateral Secured at: 20 cm Tube secured with: Tape Dental Injury: Teeth and Oropharynx as per pre-operative assessment

## 2021-07-09 NOTE — Anesthesia Preprocedure Evaluation (Addendum)
Anesthesia Evaluation  Patient identified by MRN, date of birth, ID band Patient confused    Reviewed: Allergy & Precautions, NPO status , Patient's Chart, lab work & pertinent test results, reviewed documented beta blocker date and time   Airway Mallampati: II  TM Distance: >3 FB Neck ROM: Full    Dental no notable dental hx. (+) Dental Advisory Given, Poor Dentition   Pulmonary former smoker,     + decreased breath sounds      Cardiovascular hypertension, Pt. on medications and Pt. on home beta blockers pulmonary hypertension (mod pHTN)+ Peripheral Vascular Disease and + DOE  + dysrhythmias (eliquis) Atrial Fibrillation + Valvular Problems/Murmurs (mild MR) MR  Rhythm:Irregular Rate:Tachycardia  Echo 2018 - Left ventricle: The cavity size was normal. Cavity obliteration  in systole. Systolic function was normal. The estimated ejection  fraction was in the range of 60% to 65%. Wall motion was normal;  there were no regional wall motion abnormalities. Left  ventricular diastolic function parameters were normal.  - Mitral valve: Calcified annulus. There was mild regurgitation.  - Left atrium: The atrium was mildly to moderately dilated.  - Right ventricle: Systolic function was normal.  - Pulmonary arteries: Systolic pressure was moderately elevated. PA  peak pressure: 49 mm Hg (S).    Neuro/Psych PSYCHIATRIC DISORDERS Depression Acute delirium CVA (2018)    GI/Hepatic Neg liver ROS, hiatal hernia, GERD  Medicated and Controlled,Severe abdominal pain with guarding and point tenderness in the right mid abdomen.  Mild dilation of a segment of small bowel on plain film.  With history of a. Fib, concern is for embolic event causing mesenteric ischemia.  May also have a perforated ulcer.    Endo/Other  diabetes, Well ControlledHypothyroidism   Renal/GU Renal InsufficiencyRenal diseaseCr 1.50 from 1.19 this AM  negative  genitourinary   Musculoskeletal  (+) Arthritis , Osteoarthritis,    Abdominal (+)  Abdomen: rigid and tender.    Peds  Hematology negative hematology ROS (+) hct 35.3, plt 145   Anesthesia Other Findings eliquis   Reproductive/Obstetrics negative OB ROS                           Anesthesia Physical Anesthesia Plan  ASA: 4  Anesthesia Plan: General   Post-op Pain Management:    Induction: Intravenous  PONV Risk Score and Plan: 4 or greater and Ondansetron, Dexamethasone and Treatment may vary due to age or medical condition  Airway Management Planned: Oral ETT  Additional Equipment: None  Intra-op Plan:   Post-operative Plan: Extubation in OR  Informed Consent: I have reviewed the patients History and Physical, chart, labs and discussed the procedure including the risks, benefits and alternatives for the proposed anesthesia with the patient or authorized representative who has indicated his/her understanding and acceptance.   Patient has DNR.  Discussed DNR with power of attorney, Suspend DNR and Continue DNR.   Dental advisory given and Consent reviewed with POA  Plan Discussed with: CRNA  Anesthesia Plan Comments: (Access from floor: one PIV 22G Has active type and screen 90% on room air  Will place 2 large bore PIVs, possible arterial line  DNR: d/w extensively w/ son (POA)- pt wishes to not be sustained by artifical means for extended period of time. POA gives permission for intubation during case and post-operatively for short period of time if absolutely necessary. Permission to administer medications to sustain heart rate and blood pressure, but continue to withhold chest  compressions and defibrillation. )       Anesthesia Quick Evaluation

## 2021-07-09 NOTE — Op Note (Signed)
Patient: Debbie Hampton (1932-08-07, [id number])  Date of Surgery: 07/09/21  Preoperative Diagnosis: Acute abdomen  Postoperative Diagnosis: Mesenteric Ischemia    Surgical Procedure:  Right colectomy with resection of the ileum and part of the jejunum Diagnostic laparoscopy converted to exploratory laparotomy  Operative Team Members:  Surgeon(s) and Role:    * Lizett Chowning, Hyman Hopes, MD - Primary   Anesthesiologist: Lannie Fields, DO CRNA: Ponciano Ort, CRNA; Yolonda Kida, CRNA   Anesthesia: General   Fluids:  Total I/O In: 900 [I.V.:900] Out: -   Complications: None  Drains:  none   Specimen:  ID Type Source Tests Collected by Time Destination  1 : small bowel and right colon Tissue PATH GI Other SURGICAL PATHOLOGY Laporchia Nakajima, Hyman Hopes, MD 07/09/2021 1827      Disposition:  PACU - hemodynamically stable.  Plan of Care:  Admit to intensive care unit    Indications for Procedure: Debbie Hampton is a 87 y.o. female who presented with abdominal pain.  Imaging was not revealing, her abdominal pain worsened to the point of an acute abdomen.  I recommended diagnostic laparoscopy, possible exploratory laparotomy.  The procedure itself as well as its risk, benefits, and alternatives were discussed with the patient's son and daughter-in-law as the patient was unable to provide consent.  We proceeded to the operating room emergently.  Findings: Ischemia of the jejunum, ileum, and right colon.  Only 90 cm of small bowel remained past the ligament of Treitz after resection.   Description of Procedure:   On the date stated above the patient was taken the operating room suite and placed in supine position.  General endotracheal anesthesia was induced.  A timeout was completed verifying the correct patient, procedure, position, and equipment needed for the case.  The patient's abdomen was prepped and draped in usual sterile fashion.  I began by making a infraumbilical  incision dissecting out a small umbilical hernia defect that was full of preperitoneal fat.  I placed a 5 mm trocar through this fascial defect and insufflated the abdomen first 8 mmHg then to 15 mmHg being careful not to collapse or hemodynamics.  On inspection of the abdomen there were some pieces of small bowel stuck to the abdominal wall in the right lower quadrant.  A second 5 mm trocar was placed in the suprapubic position.  A Prestige grasper was used to explore this area and ischemic bowel was encountered.  I decided to convert to open procedure.  A midline laparotomy was created from just above the umbilicus down to the previous we placed port site incorporating both the port sites and the laparotomy.  I dissected down into the abdomen without any trauma the underlying viscera.  The small bowel was inspected and the colon was inspected.  I encountered a pattern of necrotic bowel consistent with embolic mesenteric ischemia involving the mid jejunum through the right colon.  The right colon was mobilized dividing the gastrohepatic ligament and localizing the colon medially by dividing the white line of Toldt.  A hole was made in the mesocolon and the proximal transverse colon was divided and an area of healthy appearing well-perfused colon.  A hole was made in the jejunal mesentery and the mesentery of the jejunum was divided in an area of well-perfused small intestine just proximal to the necrotic bowel.  The mesentery of the small bowel and the mesocolon were divided using the LigaSure.  The specimen was passed off the field.  The remaining small  bowel was measured to be 90 cm from the ligament of Treitz to the staple line of transection.  The patient's family stated she would not want to live with an ostomy and creating a jejunostomy would be very high output and problematic so I decided to create a anastomosis.  Enterotomies were made in the small bowel at the antimesenteric border near the staple line  and the transverse colon along the tinea near the staple line.  A 75 mm blue load of the GIA stapler was used to create the anastomosis.  The common enterotomy of this anastomosis was closed with a second load of the same stapler.  The omentum was tacked down over the anastomosis and staple lines using Vicryl suture.  A crotch stitch was placed to reinforce the crotch of the staple line using Vicryl suture.  The anastomosis was returned to the abdomen.  The abdomen was irrigated copiously.  The midline laparotomy incision was closed at the fascial level using 2-0 PDS in running fashion.  The subcutaneous tissues were reapproximated using Vicryl suture.  The skin was closed using staples.  A sterile dressing was applied.  All sponge and needle counts were correct at the end of this case.  At the end of the case we reviewed the infection status of the case. Patient: Debbie Hampton [facility] Patient Case: Emergent Infection Present At Time Of Surgery (PATOS):  Mesenteric ischemia with necrotic small bowel.  Some spillage of enteric contents with creation of the anastomosis.  Ivar Drape, MD General, Bariatric, & Minimally Invasive Surgery [facility], [place]

## 2021-07-09 NOTE — Progress Notes (Addendum)
Patient is actively attempting to remove NGT, hand mitts applied to ensure safety.   VSS, will continue to monitor.

## 2021-07-09 NOTE — Progress Notes (Signed)
Subjective: Came by to visit patient after HIDA scan which was negative.  Confused.  Having a lot of abdominal pain. Vomiting feculent material.  Has not voided today.  Bladder scan with 43mL of urine.    ROS: See above, otherwise unable due to mental status.  Objective: Vital signs in last 24 hours: Temp:  [98.8 F (37.1 C)-99.1 F (37.3 C)] 98.8 F (37.1 C) (10/24 1339) Pulse Rate:  [91-104] 104 (10/24 1339) Resp:  [18-23] 23 (10/24 1339) BP: (123-136)/(68-82) 123/76 (10/24 1339) SpO2:  [92 %-94 %] 92 % (10/24 1339) Last BM Date: 07/06/21  Intake/Output from previous day: 10/23 0701 - 10/24 0700 In: 1310.1 [I.V.:962.4; IV Piggyback:347.7] Out: -  Intake/Output this shift: No intake/output data recorded.  PE: Gen: some distress secondary to abdominal pain HEENT: PERRL, feculent vomitus in her mouth Heart: irregular Lungs: CTAB Abd: soft, but with some distention, hypoactive BS, very tender to palpation with active voluntary guarding throughout Rectum: normal external exam.  No ext hemorrhoids noted.  DRE not performed at this time given acute abdominal pain and difficulty turning her on her side Ext: MAE Neuro: sensation normal throughout, grossly intact Psych: alert and somewhat oriented, but with acute delirium as well  Lab Results:  Recent Labs    07/08/21 0536 07/09/21 0456  WBC 19.6* 21.6*  HGB 12.3 11.4*  HCT 37.9 35.3*  PLT 169 145*   BMET Recent Labs    07/08/21 0536 07/09/21 0456  NA 131* 133*  K 3.5 4.1  CL 98 101  CO2 25 26  GLUCOSE 136* 97  BUN 26* 39*  CREATININE 0.80 1.19*  CALCIUM 9.3 9.1   PT/INR Recent Labs    07/07/21 0732  LABPROT 12.8  INR 1.0   CMP     Component Value Date/Time   NA 133 (L) 07/09/2021 0456   NA 137 05/04/2014 1850   K 4.1 07/09/2021 0456   K 3.5 05/04/2014 1850   CL 101 07/09/2021 0456   CL 99 05/04/2014 1850   CO2 26 07/09/2021 0456   CO2 27 05/04/2014 1850   GLUCOSE 97 07/09/2021 0456    GLUCOSE 118 (H) 05/04/2014 1850   BUN 39 (H) 07/09/2021 0456   BUN 17 05/04/2014 1850   CREATININE 1.19 (H) 07/09/2021 0456   CREATININE 0.86 05/04/2014 1850   CALCIUM 9.1 07/09/2021 0456   CALCIUM 8.2 (L) 05/04/2014 1850   PROT 5.6 (L) 07/09/2021 0456   PROT 5.9 (L) 05/04/2014 1850   ALBUMIN 2.9 (L) 07/09/2021 0456   ALBUMIN 3.2 (L) 05/04/2014 1850   AST 30 07/09/2021 0456   AST 24 05/04/2014 1850   ALT 15 07/09/2021 0456   ALT 28 05/04/2014 1850   ALKPHOS 61 07/09/2021 0456   ALKPHOS 97 05/04/2014 1850   BILITOT 0.5 07/09/2021 0456   BILITOT 0.2 05/04/2014 1850   GFRNONAA 45 (L) 07/09/2021 0456   GFRNONAA >60 05/04/2014 1850   GFRAA >60 11/28/2019 0805   GFRAA >60 05/04/2014 1850   Lipase     Component Value Date/Time   LIPASE 26 07/09/2021 0456       Studies/Results: NM Hepato W/EF  Result Date: 07/09/2021 CLINICAL DATA:  Possible cholecystitis. EXAM: NUCLEAR MEDICINE HEPATOBILIARY IMAGING WITH GALLBLADDER EF TECHNIQUE: Sequential images of the abdomen were obtained out to 60 minutes following intravenous administration of radiopharmaceutical. After oral ingestion of Ensure, gallbladder ejection fraction was determined. At 60 min, normal ejection fraction is greater than 33%. RADIOPHARMACEUTICALS:  5.4  mCi Tc-20m  Choletec IV COMPARISON:  Right upper quadrant ultrasound and CT abdomen pelvis dated July 07, 2021. FINDINGS: Prompt uptake and biliary excretion of activity by the liver is seen. Gallbladder activity is visualized, consistent with patency of cystic duct. Biliary activity passes into small bowel, consistent with patent common bile duct. Calculated gallbladder ejection fraction is 52%. (Normal gallbladder ejection fraction with Ensure is greater than 33%.) IMPRESSION: 1. Normal HIDA scan and gallbladder ejection fraction. Electronically Signed   By: Obie Dredge M.D.   On: 07/09/2021 11:32    Anti-infectives: Anti-infectives (From admission, onward)     Start     Dose/Rate Route Frequency Ordered Stop   07/08/21 1800  cefTRIAXone (ROCEPHIN) 2 g in sodium chloride 0.9 % 100 mL IVPB        2 g 200 mL/hr over 30 Minutes Intravenous Every 24 hours 07/08/21 1407     07/08/21 1500  metroNIDAZOLE (FLAGYL) IVPB 500 mg        500 mg 100 mL/hr over 60 Minutes Intravenous Every 12 hours 07/08/21 1407     07/07/21 0800  ciprofloxacin (CIPRO) IVPB 400 mg  Status:  Discontinued        400 mg 200 mL/hr over 60 Minutes Intravenous 2 times daily 07/07/21 0733 07/08/21 1407   07/07/21 0730  ciprofloxacin (CIPRO) IVPB 400 mg  Status:  Discontinued        400 mg 200 mL/hr over 60 Minutes Intravenous  Once 07/07/21 0727 07/07/21 0733   07/07/21 0730  metroNIDAZOLE (FLAGYL) IVPB 500 mg        500 mg 100 mL/hr over 60 Minutes Intravenous  Once 07/07/21 0727 07/07/21 1104        Assessment/Plan Abdominal pain, unclear etiology -CTA of the abdomen essentially negative yesterday, except gallbladder which was ruled out for cholecystitis today via HIDA scan.  No further bleeding as was initial concern from the nursing facility.  -WBC continues to rise despite Rocephin for UTI.  Up to 21K today. -essentially only 25cc of urine output today as well and Cr bump to 1.19 from 0.8 yesterday -her abdominal pain is certainly concerning along with all of these other new findings. -her BP is stable, her HR is around 100, but she is on a beta blocker twice a day so this is a bit elevated.  She is AF as well. -during my visit, we have placed a foley for better UOP monitoring after discussion with primary service.   Given multiple events of feculent emesis, we placed a 35F NGT in her R nares.  Confirmation film pending, but appears to be in her hiatal hernia as best as I can tell.  Will leave this in place as it was difficult to get in. -will proceed with stat lactic acid, BMET, and CT scan without IV contrast and a small amount or oral contrast as to limit her risk of  aspiration.   -she does have a history of A fib, but has been on her eliquis.  She could be having an embolic intestinal event, although there was no evidence of this on her prior CTA 2 days ago. -we will continue to closely monitor the patient and follow these results.  I had a long discussion with the son at bedside and he left for an errand.  I will call him with the update of the NGT and plan for scan. -repeat labs in am  FEN - NPO/NGT/IVFs VTE - ok for chemical prophylaxis or heparin gtt  since eliquis on hold ID - Rocephin/Flagyl for UTI  A fib - eliquis on hold GI bleed - no further issues and this was small in her diaper prior to admission.  Son states she had an issues with internal hemorrhoids about a year ago HTN - on BP meds, per primary ARF - Cr slightly up today to 1.19, but UOP only 25cc so far today.  Repeat BMET pending and in am DM H/O T11 compression fx, s/p kyphoplasty Acute delirium    LOS: 2 days    Letha Cape , [facility] 07/09/2021, 3:16 PM Please see Amion for pager number during day hours 7:00am-4:30pm or 7:00am -11:30am on weekends

## 2021-07-09 NOTE — Progress Notes (Signed)
PT Cancellation Note  Patient Details Name: Debbie Hampton MRN: [id number] DOB: 1932/02/05   Cancelled Treatment:    Reason Eval/Treat Not Completed: Other (comment)having test today. Will check back another time.   Rada Hay 07/09/2021, 2:54 PM

## 2021-07-10 ENCOUNTER — Encounter (HOSPITAL_COMMUNITY): Payer: Self-pay | Admitting: Surgery

## 2021-07-10 ENCOUNTER — Inpatient Hospital Stay (HOSPITAL_COMMUNITY): Payer: Medicare Other

## 2021-07-10 DIAGNOSIS — K559 Vascular disorder of intestine, unspecified: Secondary | ICD-10-CM | POA: Diagnosis not present

## 2021-07-10 DIAGNOSIS — N179 Acute kidney failure, unspecified: Secondary | ICD-10-CM | POA: Diagnosis not present

## 2021-07-10 LAB — GLUCOSE, CAPILLARY
Glucose-Capillary: 100 mg/dL — ABNORMAL HIGH (ref 70–99)
Glucose-Capillary: 120 mg/dL — ABNORMAL HIGH (ref 70–99)
Glucose-Capillary: 94 mg/dL (ref 70–99)
Glucose-Capillary: 98 mg/dL (ref 70–99)

## 2021-07-10 LAB — BASIC METABOLIC PANEL
Anion gap: 7 (ref 5–15)
BUN: 50 mg/dL — ABNORMAL HIGH (ref 8–23)
CO2: 25 mmol/L (ref 22–32)
Calcium: 8.9 mg/dL (ref 8.9–10.3)
Chloride: 104 mmol/L (ref 98–111)
Creatinine, Ser: 1.64 mg/dL — ABNORMAL HIGH (ref 0.44–1.00)
GFR, Estimated: 30 mL/min — ABNORMAL LOW (ref >60)
Glucose, Bld: 108 mg/dL — ABNORMAL HIGH (ref 70–99)
Potassium: 4.4 mmol/L (ref 3.5–5.1)
Sodium: 136 mmol/L (ref 135–145)

## 2021-07-10 LAB — CBC
HCT: 30.8 % — ABNORMAL LOW (ref 36.0–46.0)
Hemoglobin: 10 g/dL — ABNORMAL LOW (ref 12.0–15.0)
MCH: 30 pg (ref 26.0–34.0)
MCHC: 32.5 g/dL (ref 30.0–36.0)
MCV: 92.5 fL (ref 80.0–100.0)
Platelets: 152 10*3/uL (ref 150–400)
RBC: 3.33 MIL/uL — ABNORMAL LOW (ref 3.87–5.11)
RDW: 14 % (ref 11.5–15.5)
WBC: 14.1 10*3/uL — ABNORMAL HIGH (ref 4.0–10.5)
nRBC: 0 % (ref 0.0–0.2)

## 2021-07-10 LAB — LACTIC ACID, PLASMA: Lactic Acid, Venous: 1.1 mmol/L (ref 0.5–1.9)

## 2021-07-10 LAB — MRSA NEXT GEN BY PCR, NASAL: MRSA by PCR Next Gen: NOT DETECTED

## 2021-07-10 LAB — HEPARIN LEVEL (UNFRACTIONATED): Heparin Unfractionated: 0.34 IU/mL (ref 0.30–0.70)

## 2021-07-10 LAB — BRAIN NATRIURETIC PEPTIDE: B Natriuretic Peptide: 396.4 pg/mL — ABNORMAL HIGH (ref 0.0–100.0)

## 2021-07-10 MED ORDER — PHENOL 1.4 % MT LIQD: 1.0000 | OROMUCOSAL | Status: DC | PRN

## 2021-07-10 MED ORDER — LACTATED RINGERS IV BOLUS
500.0000 mL | Freq: Once | INTRAVENOUS | Status: AC
  Administered 2021-07-10: 500 mL via INTRAVENOUS

## 2021-07-10 MED ORDER — HEPARIN (PORCINE) 25000 UT/250ML-% IV SOLN
850.0000 [IU]/h | INTRAVENOUS | Status: DC
  Administered 2021-07-10 – 2021-07-13 (×4): 850 [IU]/h via INTRAVENOUS
  Filled 2021-07-10 (×4): qty 250

## 2021-07-10 MED ORDER — HEPARIN BOLUS VIA INFUSION
2000.0000 [IU] | Freq: Once | INTRAVENOUS | Status: AC
  Administered 2021-07-10: 2000 [IU] via INTRAVENOUS
  Filled 2021-07-10: qty 2000

## 2021-07-10 MED ORDER — LORAZEPAM 2 MG/ML IJ SOLN
0.2500 mg | Freq: Once | INTRAMUSCULAR | Status: AC
  Administered 2021-07-10: 0.25 mg via INTRAVENOUS
  Filled 2021-07-10: qty 1

## 2021-07-10 MED ORDER — LIP MEDEX EX OINT
TOPICAL_OINTMENT | CUTANEOUS | Status: AC
  Administered 2021-07-10: 75
  Filled 2021-07-10: qty 7

## 2021-07-10 MED ORDER — LACTATED RINGERS IV BOLUS
250.0000 mL | Freq: Once | INTRAVENOUS | Status: AC
  Administered 2021-07-10: 250 mL via INTRAVENOUS

## 2021-07-10 MED ORDER — FUROSEMIDE 10 MG/ML IJ SOLN
40.0000 mg | Freq: Once | INTRAMUSCULAR | Status: AC
  Administered 2021-07-10: 40 mg via INTRAVENOUS
  Filled 2021-07-10: qty 4

## 2021-07-10 MED ORDER — LACTATED RINGERS IV SOLN
INTRAVENOUS | Status: AC
  Administered 2021-07-11: 125 mL/h via INTRAVENOUS

## 2021-07-10 NOTE — Progress Notes (Addendum)
PROGRESS NOTE   Debbie Hampton  DTO:[id number]    DOB: [birth year]    DOA: 07/07/2021  PCP: Kandyce Rud, MD   I have briefly reviewed patients previous medical records in [facility].  Chief complaint Rectal bleeding and abdominal pain.   Brief Narrative:  87 year old female, SNF resident, extremely hard of hearing, medical history significant for chronic atrial fibrillation on Eliquis, bilateral carotid artery disease, CAD, GERD, hiatal hernia, HTN, HLD, nonhemorrhagic stroke, depression, type II DM, sent from SNF due to rectal bleeding, nausea, vomiting and abdominal pain.  Admitted for possible low volume rectal bleed likely related to hemorrhoids, main issue however was concern for acute acalculous cholecystitis.  [facility] and general surgery were consulted.  Eventually HIDA scan negative.  Progressed to acute abdomen with peritonitis.  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  Assessment & Plan:  Principal Problem:   Rectal bleeding Active Problems:   Hyponatremia   Atrial fibrillation (HCC)   Hypertension   Compression fracture of T11 vertebra (HCC)   Hypothyroidism   Type 2 diabetes mellitus with hyperglycemia (HCC)   Dilated gallbladder   Acute lower UTI   Elevated lipase   Renal artery stenosis (HCC)   HLD (hyperlipidemia)   Aortic atherosclerosis (HCC)   Acute abdomen with peritonitis due to embolic mesenteric ischemia: - CTA abdomen 10/22: Gallbladder distended and diffusely edematous, particularly around the neck and cystic duct, these findings could be consistent with acute cholecystitis. - RUQ ultrasound: Findings raise the question of a calculus cholecystitis.  HIDA scan recommended. [facility] general surgery on board. - HIDA scan negative. - 10/24, progressed to acute abdomen with peritonitis.  S/p diagnostic  laparoscopy converted to exploratory laparotomy with right colectomy and ileal resection for mesenteric ischemia consistent with embolic pattern on 07/09/2021.  90 cm of small bowel left after the ligament of treitz, anastomosis to proximal transverse colon. Post op transferred to ICU -Postop ileus.  Continue n.p.o., NG tube, IV fluids, IV ceftriaxone and Flagyl, Foley catheter.  Surgery follow-up appreciated.  Sepsis, not POA, secondary to mesenteric ischemia - IV fluids and antibiotics as above.  Lactate up to 2.2. - Sepsis physiology has resolved.  Low volume rectal bleeding - Likely related to hemorrhoids. - No further bleeding. - Apixaban was held from admission due to concern for rectal bleeding and possible need for some form of surgical intervention. - General surgery has cleared to resume anticoagulation, will start IV heparin for A. fib.  E. coli acute cystitis - Urine culture confirms E. coli, sensitive to ceftriaxone. - Briefly on IV Cipro which was switched to IV ceftriaxone.  Dehydration with hyponatremia - Likely secondary to GI losses and poor oral intake. - Ongoing decreased urine output.  145 mL documented yesterday. - Appears under hydrated to me.  We will bolus with 500 mL LR and continue IV fluids at 100 mL/h with close monitoring.  Acute kidney injury, oliguric, possibly related to sepsis and ATN - Likely related to sepsis and dehydration. - Creatinine increased from 0.8 > 1.19 > 1.5 >1.64 in the last 48 - Continue IV fluids as above with close monitoring. - Strict intake and output. - Nephrology consulted for assistance.  Acute metabolic encephalopathy - Baseline mentation not known. - Likely related to acute infectious etiology discussed above and dehydration. - Delirium precautions.  Minimize opioids as much as possible.  Frequent reorientation.  Follow. -  Now that infection source has been controlled, hopefully should get better.  Chronic atrial  fibrillation - Hold atenolol due to n.p.o. status.  IV metoprolol every 6 hours, tolerating same. - As per CCS, okay to start anticoagulation.  IV heparin initiated.  Acute anemia - Multifactorial due to acute illness, postop blood loss, hemodilution. - Follow CBC daily and transfuse if hemoglobin 7 g or less.  Leukocytosis - Secondary to acute mesenteric ischemia - Decreasing.  Continue IV antibiotics.  Essential hypertension - History of left renal artery stenosis, medically treated. - DC atenolol and lisinopril and start IV metoprolol. - Reasonably controlled.  Avoid hypotension.  T11 compression fracture s/p intervention - No pain reported.  Hypothyroidism/multiple thyroid nodules - Follow TSH: 0.596.  Outpatient follow-up.  Type II DM with hyperglycemia, diet controlled - Monitor CBGs and consider SSI if needed.  Reasonably controlled.  Hyperlipidemia/aortic atherosclerosis - Holding statins for now.  Extremely hard of hearing - Family to bring hearing aids  Moderately large sliding hiatal hernia/GERD  Bilateral pleural effusions (right > left)  Body mass index is 24.14 kg/m.  Nutritional Status        Pressure Ulcer:     DVT prophylaxis: SCDs Start: 07/07/21 0729     Code Status: DNR Family Communication: I discussed in detail with son/HCPOA 10/24, updated critical nature of her illness, ongoing care and answered questions.  None at bedside Disposition:  Status is: Inpatient  Remains inpatient appropriate because: S/p exploratory laparotomy with extensive surgery, postop ileus, AKI, IV fluids and antibiotics, will need several days of acute inpatient management.        Consultants:   [facility]  Procedures:   Foley catheter NG tube 10/24: S/p diagnostic laparoscopy converted to exploratory laparotomy with right colectomy and ileal resection for mesenteric ischemia consistent with embolic pattern  Antimicrobials:     Anti-infectives (From admission, onward)    Start     Dose/Rate Route Frequency Ordered Stop   07/08/21 1800  cefTRIAXone (ROCEPHIN) 2 g in sodium chloride 0.9 % 100 mL IVPB        2 g 200 mL/hr over 30 Minutes Intravenous Every 24 hours 07/08/21 1407     07/08/21 1500  metroNIDAZOLE (FLAGYL) IVPB 500 mg        500 mg 100 mL/hr over 60 Minutes Intravenous Every 12 hours 07/08/21 1407     07/07/21 0800  ciprofloxacin (CIPRO) IVPB 400 mg  Status:  Discontinued        400 mg 200 mL/hr over 60 Minutes Intravenous 2 times daily 07/07/21 0733 07/08/21 1407   07/07/21 0730  ciprofloxacin (CIPRO) IVPB 400 mg  Status:  Discontinued        400 mg 200 mL/hr over 60 Minutes Intravenous  Once 07/07/21 0727 07/07/21 0733   07/07/21 0730  metroNIDAZOLE (FLAGYL) IVPB 500 mg        500 mg 100 mL/hr over 60 Minutes Intravenous  Once 07/07/21 8416 07/07/21 1104         Subjective:  Patient was interviewed and examined along with her female RN's in the room.  Awake, alert, unable to understand what she is saying, appears confused.  Objective:   Vitals:   07/10/21 0300 07/10/21 0400 07/10/21 0500 07/10/21 0600  BP: (!) 94/56 126/65 (!) 105/57 97/66  Pulse: 98 97 95 (!) 108  Resp: 13 13 13 13   Temp:      TempSrc:      SpO2: 100% 100% 100% 100%  Weight:      Height:        General exam: Elderly female, moderately built, frail and chronically ill looking female, does look better than she did all day yesterday. Respiratory system: Clear to auscultation.  No increased work of breathing. Cardiovascular system: S1 and S2 heard, irregularly irregular and tachycardic.  No JVD, murmurs or pedal edema. Gastrointestinal system: Abdomen is nondistended, soft and nontender.  Honeycomb dressing over exploratory laparotomy site intact.  No bowel sounds heard. Central nervous system: Mental status as noted above.  Not oriented.  No focal neurological deficits. Extremities: Symmetric 5 x 5 power. Skin: No  rashes, lesions or ulcers Psychiatry: Judgement and insight cannot be assessed at this time. Mood & affect appropriate.     Data Reviewed:   I have personally reviewed following labs and imaging studies   CBC: Recent Labs  Lab 07/08/21 0536 07/09/21 0456 07/09/21 1836 07/10/21 0522  WBC 19.6* 21.6*  --  14.1*  NEUTROABS  --  19.0*  --   --   HGB 12.3 11.4* 8.2* 10.0*  HCT 37.9 35.3* 24.0* 30.8*  MCV 90.9 92.7  --  92.5  PLT 169 145*  --  152    Basic Metabolic Panel: Recent Labs  Lab 07/09/21 0456 07/09/21 1524 07/09/21 1836 07/10/21 0522  NA 133* 133* 139 136  K 4.1 4.0 3.2* 4.4  CL 101 100  --  104  CO2 26 24  --  25  GLUCOSE 97 122*  --  108*  BUN 39* 43*  --  50*  CREATININE 1.19* 1.50*  --  1.64*  CALCIUM 9.1 9.4  --  8.9    Liver Function Tests: Recent Labs  Lab 07/08/21 0536 07/08/21 2031 07/09/21 0456  AST 30 28 30   ALT 19 16 15   ALKPHOS 74 71 61  BILITOT 0.6 0.8 0.5  PROT 5.9* 5.8* 5.6*  ALBUMIN 3.3* 3.1* 2.9*    CBG: Recent Labs  Lab 07/09/21 1141 07/10/21 0005 07/10/21 0607  GLUCAP 118* 120* 98    Microbiology Studies:   Recent Results (from the past 240 hour(s))  Urine Culture     Status: Abnormal   Collection Time: 07/07/21  2:50 AM   Specimen: Urine, Clean Catch  Result Value Ref Range Status   Specimen Description   Final    URINE, CLEAN CATCH Performed at [facility], [address], [place], [place]    Special Requests   Final    NONE Performed at [facility], [address], [place], [place]    Culture >=100,000 COLONIES/mL ESCHERICHIA COLI (A)  Final   Report Status 07/09/2021 FINAL  Final   Organism ID, Bacteria ESCHERICHIA COLI (A)  Final      Susceptibility   Escherichia coli - MIC*    AMPICILLIN >=32 RESISTANT Resistant     CEFAZOLIN <=4 SENSITIVE Sensitive     CEFEPIME <=0.12 SENSITIVE Sensitive     CEFTRIAXONE <=0.25 SENSITIVE Sensitive      CIPROFLOXACIN <=0.25 SENSITIVE Sensitive     GENTAMICIN <=1 SENSITIVE Sensitive     IMIPENEM <=0.25 SENSITIVE Sensitive     NITROFURANTOIN <=16 SENSITIVE Sensitive     TRIMETH/SULFA >=320 RESISTANT Resistant     AMPICILLIN/SULBACTAM 16 INTERMEDIATE Intermediate     PIP/TAZO <=4 SENSITIVE Sensitive     * >=100,000 COLONIES/mL ESCHERICHIA COLI  Resp Panel by RT-PCR (Flu A&B, Covid) Nasopharyngeal Swab     Status: None  Collection Time: 07/07/21  7:42 AM   Specimen: Nasopharyngeal Swab; Nasopharyngeal(NP) swabs in vial transport medium  Result Value Ref Range Status   SARS Coronavirus 2 by RT PCR NEGATIVE NEGATIVE Final    Comment: (NOTE) SARS-CoV-2 target nucleic acids are NOT DETECTED.  The SARS-CoV-2 RNA is generally detectable in upper respiratory specimens during the acute phase of infection. The lowest concentration of SARS-CoV-2 viral copies this assay can detect is 138 copies/mL. A negative result does not preclude SARS-Cov-2 infection and should not be used as the sole basis for treatment or other patient management decisions. A negative result may occur with  improper specimen collection/handling, submission of specimen other than nasopharyngeal swab, presence of viral mutation(s) within the areas targeted by this assay, and inadequate number of viral copies(<138 copies/mL). A negative result must be combined with clinical observations, patient history, and epidemiological information. The expected result is Negative.  Fact Sheet for Patients:  BloggerCourse.com  Fact Sheet for Healthcare Providers:  SeriousBroker.it  This test is no t yet approved or cleared by the [place] FDA and  has been authorized for detection and/or diagnosis of SARS-CoV-2 by FDA under an Emergency Use Authorization (EUA). This EUA will remain  in effect (meaning this test can be used) for the duration of the COVID-19 declaration under  Section 564(b)(1) of the Act, 21 U.S.C.section 360bbb-3(b)(1), unless the authorization is terminated  or revoked sooner.       Influenza A by PCR NEGATIVE NEGATIVE Final   Influenza B by PCR NEGATIVE NEGATIVE Final    Comment: (NOTE) The Xpert Xpress SARS-CoV-2/FLU/RSV plus assay is intended as an aid in the diagnosis of influenza from Nasopharyngeal swab specimens and should not be used as a sole basis for treatment. Nasal washings and aspirates are unacceptable for Xpert Xpress SARS-CoV-2/FLU/RSV testing.  Fact Sheet for Patients: BloggerCourse.com  Fact Sheet for Healthcare Providers: SeriousBroker.it  This test is not yet approved or cleared by the [place] FDA and has been authorized for detection and/or diagnosis of SARS-CoV-2 by FDA under an Emergency Use Authorization (EUA). This EUA will remain in effect (meaning this test can be used) for the duration of the COVID-19 declaration under Section 564(b)(1) of the Act, 21 U.S.C. section 360bbb-3(b)(1), unless the authorization is terminated or revoked.  Performed at [facility], [address], [place], [place] [address]   MRSA Next Gen by PCR, Nasal     Status: None   Collection Time: 07/09/21 10:39 PM   Specimen: Nasal Mucosa; Nasal Swab  Result Value Ref Range Status   MRSA by PCR Next Gen NOT DETECTED NOT DETECTED Final    Comment: (NOTE) The GeneXpert MRSA Assay (FDA approved for NASAL specimens only), is one component of a comprehensive MRSA colonization surveillance program. It is not intended to diagnose MRSA infection nor to guide or monitor treatment for MRSA infections. Test performance is not FDA approved in patients less than 82 years old. Performed at [facility], [address], [place], [place] [address]      Radiology Studies:  NM Hepato W/EF  Result Date: 07/09/2021 CLINICAL DATA:  Possible  cholecystitis. EXAM: NUCLEAR MEDICINE HEPATOBILIARY IMAGING WITH GALLBLADDER EF TECHNIQUE: Sequential images of the abdomen were obtained out to 60 minutes following intravenous administration of radiopharmaceutical. After oral ingestion of Ensure, gallbladder ejection fraction was determined. At 60 min, normal ejection fraction is greater than 33%. RADIOPHARMACEUTICALS:  5.4 mCi Tc-70m  Choletec IV COMPARISON:  Right upper quadrant  ultrasound and CT abdomen pelvis dated July 07, 2021. FINDINGS: Prompt uptake and biliary excretion of activity by the liver is seen. Gallbladder activity is visualized, consistent with patency of cystic duct. Biliary activity passes into small bowel, consistent with patent common bile duct. Calculated gallbladder ejection fraction is 52%. (Normal gallbladder ejection fraction with Ensure is greater than 33%.) IMPRESSION: 1. Normal HIDA scan and gallbladder ejection fraction. Electronically Signed   By: Obie Dredge M.D.   On: 07/09/2021 11:32   DG Abd Portable 1V  Result Date: 07/09/2021 CLINICAL DATA:  Encounter for NG tube placement EXAM: PORTABLE ABDOMEN - 1 VIEW COMPARISON:  None. FINDINGS: NG tube extends into the proximal stomach. This portion of stomach is above the diaphragm consistent with hiatal hernia as seen on CT 07/07/2021. Mildly dilated loops central small bowel measure up to 3.5 cm. Gas the rectum. IMPRESSION: 1. NG tube within proximal stomach within a hiatal hernia. 2. Mildly dilated loops of central small bowel. Gas within the colon or rectum. Electronically Signed   By: Genevive Bi M.D.   On: 07/09/2021 15:47   DG Abd Portable 1V  Result Date: 07/09/2021 CLINICAL DATA:  Abdominal pain and tenderness EXAM: PORTABLE ABDOMEN - 1 VIEW COMPARISON:  None. FINDINGS: Mildly dilated gas-filled loops of small bowel. Air seen in the rectum. No supine evidence of free air. Partially visualized lungs demonstrate and moderate hiatal hernia bibasilar  atelectasis. No acute osseous abnormality. IMPRESSION: Mildly dilated gas-filled loops of small bowel with air seen in the rectum, findings can be seen in the setting of early or partial small bowel obstruction. Electronically Signed   By: Allegra Lai M.D.   On: 07/09/2021 15:14     Scheduled Meds:    Chlorhexidine Gluconate Cloth  6 each Topical Daily   diclofenac Sodium  2 g Topical QID   metoprolol tartrate  5 mg Intravenous Q6H   pantoprazole (PROTONIX) IV  40 mg Intravenous Daily    Continuous Infusions:    sodium chloride Stopped (07/09/21 1613)   cefTRIAXone (ROCEPHIN)  IV 2 g (07/08/21 1809)   lactated ringers     lactated ringers     metronidazole Stopped (07/10/21 0437)     LOS: 3 days     Marcellus Scott, MD, FACP, [facility]. [facility]    To contact the attending provider between 7A-7P or the covering provider during after hours 7P-7A, please log into the web site [email or web address] and access using universal [place] password for that web site. If you do not have the password, please call the hospital operator.  07/10/2021, 9:06 AM

## 2021-07-10 NOTE — Progress Notes (Signed)
Progress Note: General Surgery Service   Chief Complaint/Subjective: Shouting some things about a pastor and her room when I walk in but able to have a conversation with her once she is redirected.  Talking about her kids names.  Hearing aids not in place right now.  Objective: Vital signs in last 24 hours: Temp:  [97.3 F (36.3 C)-98.8 F (37.1 C)] 98.4 F (36.9 C) (10/24 2115) Pulse Rate:  [25-131] 108 (10/25 0600) Resp:  [11-25] 13 (10/25 0600) BP: (92-144)/(46-103) 97/66 (10/25 0600) SpO2:  [81 %-100 %] 100 % (10/25 0600) Arterial Line BP: (70-134)/(35-74) 86/59 (10/25 0600) Last BM Date: 07/06/21  Intake/Output from previous day: 10/24 0701 - 10/25 0700 In: 3470.1 [I.V.:2871.1; IV Piggyback:599] Out: 196 [Urine:145; Emesis/NG output:26; Blood:25] Intake/Output this shift: No intake/output data recorded.  GI: Abd incision c/d/I w/ staples and honeycomb dressing  Lab Results: CBC  Recent Labs    07/09/21 0456 07/09/21 1836 07/10/21 0522  WBC 21.6*  --  14.1*  HGB 11.4* 8.2* 10.0*  HCT 35.3* 24.0* 30.8*  PLT 145*  --  152   BMET Recent Labs    07/09/21 1524 07/09/21 1836 07/10/21 0522  NA 133* 139 136  K 4.0 3.2* 4.4  CL 100  --  104  CO2 24  --  25  GLUCOSE 122*  --  108*  BUN 43*  --  50*  CREATININE 1.50*  --  1.64*  CALCIUM 9.4  --  8.9   PT/INR No results for input(s): LABPROT, INR in the last 72 hours. ABG Recent Labs    07/09/21 1836  PHART 7.396  HCO3 22.8    Anti-infectives: Anti-infectives (From admission, onward)    Start     Dose/Rate Route Frequency Ordered Stop   07/08/21 1800  cefTRIAXone (ROCEPHIN) 2 g in sodium chloride 0.9 % 100 mL IVPB        2 g 200 mL/hr over 30 Minutes Intravenous Every 24 hours 07/08/21 1407     07/08/21 1500  metroNIDAZOLE (FLAGYL) IVPB 500 mg        500 mg 100 mL/hr over 60 Minutes Intravenous Every 12 hours 07/08/21 1407     07/07/21 0800  ciprofloxacin (CIPRO) IVPB 400 mg  Status:  Discontinued         400 mg 200 mL/hr over 60 Minutes Intravenous 2 times daily 07/07/21 0733 07/08/21 1407   07/07/21 0730  ciprofloxacin (CIPRO) IVPB 400 mg  Status:  Discontinued        400 mg 200 mL/hr over 60 Minutes Intravenous  Once 07/07/21 0727 07/07/21 0733   07/07/21 0730  metroNIDAZOLE (FLAGYL) IVPB 500 mg        500 mg 100 mL/hr over 60 Minutes Intravenous  Once 07/07/21 0727 07/07/21 1104       Medications: Scheduled Meds:  Chlorhexidine Gluconate Cloth  6 each Topical Daily   diclofenac Sodium  2 g Topical QID   metoprolol tartrate  5 mg Intravenous Q6H   pantoprazole (PROTONIX) IV  40 mg Intravenous Daily   Continuous Infusions:  sodium chloride Stopped (07/09/21 1613)   cefTRIAXone (ROCEPHIN)  IV 2 g (07/08/21 1809)   lactated ringers 100 mL/hr at 07/10/21 0439   metronidazole Stopped (07/10/21 0437)   PRN Meds:.sodium chloride, acetaminophen **OR** acetaminophen, antiseptic oral rinse, morphine injection, ondansetron **OR** ondansetron (ZOFRAN) IV, phenol  Assessment/Plan: Ms. Winegardner is an 87 year old female who presented with atrial fibrillation on eliquis who presented with abdominal pain concerning at  first for cholecystitis.  Workup was negative but her pain continued and she developed peritonitic signs on exam so she was taken for diagnostic laparoscopy converted to exploratory laparotomy with right colectomy and ileum resection on 07/09/21, for mesenteric ischemia consistent with an embolic pattern.  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 - 90 cm of small bowel left after the ligament of treitz, anastomosis to proximal transverse colon - Careful fluid balance, check BNP, may need diuresis - Keep foley for strict I/O - increase activity - NPO with NG to LIWS - okay for ice chips, hard candy, gum, sips of liquids - Awaiting return of bowel function - Remove a-line - Okay to restart anticoagulation from surgery standpoint  Remainder of  care per primary team   LOS: 3 days    Quentin Ore, MD  [facility] Use [email or web address] to contact on call provider

## 2021-07-10 NOTE — Evaluation (Signed)
Occupational Therapy Evaluation Patient Details Name: Debbie Hampton MRN: [id number] DOB: 01-25-1932 Today's Date: 07/10/2021   History of Present Illness 87 year old female, SNF resident, extremely hard of hearing, medical history significant for chronic atrial fibrillation on Eliquis, bilateral carotid artery disease, CAD, GERD, hiatal hernia, HTN, HLD, nonhemorrhagic stroke, depression, type II DM, Kyphoplasty 06/05/21, sent from SNF due to rectal bleeding, nausea, vomiting and abdominal pain.   Progressed to acute abdomen with peritonitis.  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   Clinical Impression   Patient is a 87 year old female who was admitted for above. Patient was noted to have poor sitting balance on edge of bed with HR increasing to 128 at times during sitting on edge of bed with strong posterior leaning. Patient was previously at facility with assist for ADL tasks. Currently, patient was noted to have decreased activity tolerance, decreased endurance, decreased standing balance,  decreased safety awareness, decreased sitting tolerance/balanace impacting participation in ADLs. Patient would continue to benefit from skilled OT services at this time while admitted and after d/c to address noted deficits in order to improve overall safety and independence in ADLs.       Recommendations for follow up therapy are one component of a multi-disciplinary discharge planning process, led by the attending physician.  Recommendations may be updated based on patient status, additional functional criteria and insurance authorization.   Follow Up Recommendations  Skilled nursing-short term rehab (<3 hours/day)    Assistance Recommended at Discharge Frequent or constant Supervision/Assistance  Functional Status Assessment  Patient has had a recent decline in their  functional status and/or demonstrates limited ability to make significant improvements in function in a reasonable and predictable amount of time  Equipment Recommendations       Recommendations for Other Services       Precautions / Restrictions Precautions Precautions: Fall Precaution Comments: NG suction, ABD incision, recent T11 fx Restrictions Weight Bearing Restrictions: No      Mobility Bed Mobility Overal bed mobility: Needs Assistance Bed Mobility: Supine to Sit;Sit to Supine     Supine to sit: Max assist Sit to supine: Total assist   General bed mobility comments: patient was able to walk legs towards edge of bed with multimodal cues. patient needed physical assistance to sit up on edge of bed.    Transfers                   General transfer comment: NT      Balance Overall balance assessment: Needs assistance Sitting-balance support: Bilateral upper extremity supported;Feet supported Sitting balance-Leahy Scale: Poor   Postural control: Posterior lean Standing balance support: Bilateral upper extremity supported                               ADL either performed or assessed with clinical judgement   ADL Overall ADL's : Needs assistance/impaired Eating/Feeding: NPO   Grooming: Wash/dry face;Bed level;Minimal assistance   Upper Body Bathing: Moderate assistance;Bed level   Lower Body Bathing: Bed level;Maximal assistance   Upper Body Dressing : Bed level;Moderate assistance   Lower Body Dressing: Bed level;Maximal assistance   Toilet Transfer: +2 for physical assistance;+2 for safety/equipment Toilet Transfer Details (indicate cue type and reason): patient attempted sitting on edge of bed with noted posterior leaning. standing was deferred for safety today. Toileting- Clothing Manipulation and  Hygiene: Bed level;Maximal assistance               Vision   Additional Comments: hard to assess with patient noted to be easily  distracted and tangental at times     Perception     Praxis      Pertinent Vitals/Pain Facial Expression: Tense Body Movements: Restlessness Muscle Tension: Relaxed Compliance with ventilator (intubated pts.): N/A Vocalization (extubated pts.): Sighing, moaning CPOT Total: 4     Hand Dominance Right   Extremity/Trunk Assessment Upper Extremity Assessment Upper Extremity Assessment: Generalized weakness   Lower Extremity Assessment Lower Extremity Assessment: Defer to PT evaluation   Cervical / Trunk Assessment Cervical / Trunk Assessment: Kyphotic   Communication Communication Communication: HOH   Cognition Arousal/Alertness: Awake/alert Behavior During Therapy: Restless;Anxious Overall Cognitive Status: Impaired/Different from baseline Area of Impairment: Orientation;Attention;Following commands;Safety/judgement;Awareness                 Orientation Level: Time;Situation Current Attention Level: Focused   Following Commands: Follows one step commands inconsistently Safety/Judgement: Decreased awareness of deficits Awareness: Intellectual   General Comments: patient reported that she was at [facility] and that daughter in law works here. patietn was noted to be easily distracted. family noted that patients high pitch voice was not her usual.     General Comments       Exercises     Shoulder Instructions      Home Living Family/patient expects to be discharged to:: Skilled nursing facility                                 Additional Comments: Resident of [name]      Prior Functioning/Environment Prior Level of Function : Needs assist             Mobility Comments: per son, ambulated x 60' at rehab with RW, required assist with ADL's ADLs Comments: patients son reported patient needed +2 assistance for showering        OT Problem List: Decreased strength;Decreased activity tolerance;Decreased coordination;Decreased knowledge  of use of DME or AE;Cardiopulmonary status limiting activity;Decreased safety awareness;Impaired balance (sitting and/or standing)      OT Treatment/Interventions: Self-care/ADL training;DME and/or AE instruction;Therapeutic activities;Balance training;Therapeutic exercise;Energy conservation;Neuromuscular education;Patient/family education    OT Goals(Current goals can be found in the care plan section)    OT Frequency: Min 2X/week   Barriers to D/C:            Co-evaluation PT/OT/SLP Co-Evaluation/Treatment: Yes Reason for Co-Treatment: Complexity of the patient's impairments (multi-system involvement);For patient/therapist safety PT goals addressed during session: Mobility/safety with mobility OT goals addressed during session: ADL's and self-care      AM-PAC OT "6 Clicks" Daily Activity     Outcome Measure Help from another person eating meals?: Total (NPO) Help from another person taking care of personal grooming?: A Little Help from another person toileting, which includes using toliet, bedpan, or urinal?: Total Help from another person bathing (including washing, rinsing, drying)?: Total Help from another person to put on and taking off regular upper body clothing?: A Lot Help from another person to put on and taking off regular lower body clothing?: Total 6 Click Score: 9   End of Session Nurse Communication: Other (comment) (nurse present for session)  Activity Tolerance: Patient tolerated treatment well Patient left: in bed;with call bell/phone within reach;with bed alarm set  OT Visit Diagnosis: Unsteadiness on feet (R26.81);Muscle weakness (  generalized) (M62.81)                Time: 5573-2202 OT Time Calculation (min): 18 min Charges:  OT General Charges $OT Visit: 1 Visit OT Evaluation $OT Eval Moderate Complexity: 1 Mod  Sharyn Blitz OTR/L, MS Acute Rehabilitation Department Office# [phone] Pager# [phone]   [facility] Guest Tamryn Popko 07/10/2021, 4:24  PM

## 2021-07-10 NOTE — Consult Note (Signed)
[facility] Nephrology Consultation Note  Requesting MD: Dr Marcellus Scott Reason for consult: AKI  HPI:  Debbie Hampton is a 87 y.o. female with past medical history of hypertension, HLD, DM, nonhemorrhagic stroke, anxiety depression, CAD, A. fib, acid reflux, SNF resident who presented with rectal bleeding, nausea vomiting and abdominal pain, seen as a consultation for the evaluation of acute kidney injury.  Patient was admitted for rectal bleed likely due to hemorrhoids and acute acalculous cholecystitis.  Initially seen by [facility] and general surgery.  The HIDA scan was negative.  The acute abdomen progressed to peritonitis status post diagnostic laparoscopy which was converted to exploratory laparotomy with right colectomy and ileal resection for mesenteric ischemia consistent with embolic pattern on 07/09/2021.  The patient received IV contrast during CT angio for GI bleed.  The blood pressure dropped to 90s yesterday.  The patient had creatinine level of 0.63 on admission which was gradually increased to 1.50 yesterday and 1.64 today.  She has indwelling Foley catheter for a strict ins and out.  She is currently n.p.o.  Urine output is recorded only 145 cc in 24 hours.  She is on lisinopril, Lasix at home which is currently on hold.  The patient received 500 cc of LR bolus and then on maintenance IV fluid.  Apparently, the surgeon also ordered a dose of Lasix this afternoon. The urine consistent with possible UTI and has some protein. The patient is currently receiving ceftriaxone, Flagyl and maintenance IV fluid. She reports dry mouth and thirsty.  She wanted to drink something.  Rest of the review of system unreliable because of hard of hearing.  I have discussed with the bedside nurse as well as with the primary team.  Creatinine  Date/Time Value Ref Range Status  05/04/2014 06:50 PM 0.86 0.60 - 1.30 mg/dL Final   Creatinine, Ser  Date/Time Value Ref Range Status   07/10/2021 05:22 AM 1.64 (H) 0.44 - 1.00 mg/dL Final  16/06/9603 54:09 PM 1.50 (H) 0.44 - 1.00 mg/dL Final  81/19/1478 29:56 AM 1.19 (H) 0.44 - 1.00 mg/dL Final  21/30/8657 84:69 AM 0.80 0.44 - 1.00 mg/dL Final  62/95/2841 32:44 AM 0.63 0.44 - 1.00 mg/dL Final  09/18/7251 66:44 AM 0.75 0.44 - 1.00 mg/dL Final    PMHx:   Past Medical History:  Diagnosis Date   Aortic atherosclerosis (HCC)    Arthritis    Atrial fibrillation (HCC)    a.) CHA2DS2-VASc = 7; on low dose apixaban. b.) TTE 07/30/2017 --> EF 60-65%; mild MR and TR.   Bilateral shoulder pain    Carotid artery disease (HCC)    a.)  BILATERAL carotid Doppler on 07/30/2017 --> mild to moderate plaque in BILATERAL ICAs compatible with 50-69% stenosis (R >L).   Chronic anticoagulation    Apixaban   Colon polyp    Constipation    Coronary artery calcification    a.) CT chest on 05/07/2021 --> 3 vessel CAD.   Depression    DOE (dyspnea on exertion)    Falls    GERD (gastroesophageal reflux disease)    Hiatal hernia    a.) CT chest on 05/07/2021 --> large; distended with fluid.   History of hiatal hernia    HLD (hyperlipidemia)    Hypertension    Impaired fasting glucose    Multiple thyroid nodules    Bilateral   Stroke (HCC) 07/29/2017   a.) Acute 1.5 cm infarction within the periventricular deep white matter on the RIGHT adjacent to  the posterior body of the lateral ventricle.   Syncope 03/2016   with CHI   Valvular regurgitation    a.) TTE 08/03/2017 --> mild MR and TR.   Varicose veins of both lower extremities     Past Surgical History:  Procedure Laterality Date   COLONOSCOPY     KYPHOPLASTY N/A 06/05/2021   Procedure: T 11 KYPHOPLASTY;  Surgeon: Kennedy Bucker, MD;  Location: [facility] ORS;  Service: Orthopedics;  Laterality: N/A;   LAPAROSCOPY N/A 07/09/2021   Procedure: LAPAROSCOPY DIAGNOSTIC;  Surgeon: Quentin Ore, MD;  Location: [facility];  Service: General;  Laterality: N/A;   LAPAROTOMY N/A  07/09/2021   Procedure: EXPLORATORY LAPAROTOMY WITH RESECTION OF RIGHT COLON AND ILEUM;  Surgeon: Quentin Ore, MD;  Location: [facility];  Service: General;  Laterality: N/A;   TONSILLECTOMY     TUMOR REMOVAL     ovaries    Family Hx:  Family History  Problem Relation Age of Onset   Hypertension Other    Breast cancer Sister    Kidney cancer Neg Hx    Kidney disease Neg Hx    Prostate cancer Neg Hx     Social History:  reports that she quit smoking about 37 years ago. Her smoking use included cigarettes. She has never used smokeless tobacco. She reports that she does not drink alcohol and does not use drugs.  Allergies:  Allergies  Allergen Reactions   Cheese     GOAT cheese   Mushroom Extract Complex    Penicillins     Has patient had a PCN reaction causing immediate rash, facial/tongue/throat swelling, SOB or lightheadedness with hypotension: Yes Has patient had a PCN reaction causing severe rash involving mucus membranes or skin necrosis: No Has patient had a PCN reaction that required hospitalization No Has patient had a PCN reaction occurring within the last 10 years: Yes If all of the above answers are "NO", then may proceed with Cephalosporin use.    Verapamil    Aloe Hives and Rash   Amlodipine Hives, Rash and Hypertension   Cephalexin Rash    Tolerated Ancef 09 of 2022   Metoprolol Hives, Palpitations and Hypertension    Succinate   Miconazole Swelling and Rash   Neosporin [Neomycin-Bacitracin Zn-Polymyx] Rash   Trandolapril-Verapamil Hcl Er Rash    Medications: Prior to Admission medications   Medication Sig Start Date End Date Taking? Authorizing Provider  atenolol (TENORMIN) 50 MG tablet Take 1 tablet (50 mg total) by mouth 2 (two) times daily. 05/09/21  Yes Enedina Finner, MD  calcium carbonate (TUMS EX) 750 MG chewable tablet Chew 2 tablets by mouth 3 (three) times daily as needed for heartburn.   Yes [provider]  cholecalciferol (VITAMIN  D3) 25 MCG (1000 UNIT) tablet Take 2,000 Units by mouth daily.   Yes [provider]  citalopram (CELEXA) 20 MG tablet Take 20 mg by mouth daily.    Yes [provider]  diclofenac Sodium (VOLTAREN) 1 % GEL Apply 2 g topically 4 (four) times daily. 04/30/21  Yes [provider]  Esomeprazole Magnesium 20 MG TBEC Take 20 mg by mouth daily at 12 noon.   Yes [provider]  Fluocinolone Acetonide 0.01 % OIL Place 2 drops into both ears once a week. Wednesdays 05/02/21  Yes [provider]  fluticasone (FLONASE) 50 MCG/ACT nasal spray Place 1 spray into both nostrils daily as needed for allergies or rhinitis.   Yes [provider]  furosemide (LASIX) 20 MG tablet Take 1 tablet (20 mg total) by mouth daily as needed. For SOB or leg edema 05/09/21  Yes Enedina Finner, MD  guaiFENesin (MUCINEX) 600 MG 12 hr tablet Take 600 mg by mouth 2 (two) times daily as needed (congestion).   Yes [provider]  HYDROcodone-acetaminophen (NORCO/VICODIN) 5-325 MG tablet Take 1-2 tablets by mouth every 4 (four) hours as needed for moderate pain (pain score 4-6). 06/06/21  Yes Evon Slack, PA-C  iron polysaccharides (NIFEREX) 150 MG capsule Take 150 mg by mouth daily.   Yes [provider]  lidocaine (LIDODERM) 5 % Place 1 patch onto the skin daily. Remove & Discard patch within 12 hours or as directed by MD 05/10/21  Yes Enedina Finner, MD  lisinopril (ZESTRIL) 10 MG tablet Take 10 mg by mouth daily. 11/21/20  Yes [provider]  liver oil-zinc oxide (DESITIN) 40 % ointment Apply 1 application topically in the morning, at noon, and at bedtime. Apply to buttocks   Yes [provider]  Multiple Vitamins-Minerals (CERTAVITE/ANTIOXIDANTS) TABS Take 1 tablet by mouth daily.   Yes [provider]  omega-3 acid ethyl esters (LOVAZA) 1 g capsule Take 1 g by mouth 2 (two) times daily.   Yes [provider]  polyethylene glycol  (MIRALAX / GLYCOLAX) packet Take 17 g by mouth daily. 08/14/17  Yes Love, Evlyn Kanner, PA-C  rosuvastatin (CRESTOR) 20 MG tablet Take 20 mg by mouth at bedtime.   Yes [provider]  sodium chloride (OCEAN) 0.65 % SOLN nasal spray Place 1 spray into both nostrils 3 (three) times daily as needed for congestion.    Yes [provider]  tiZANidine (ZANAFLEX) 2 MG tablet Take 2 mg by mouth as needed (twice daily as needed). 04/26/21  Yes [provider]    I have reviewed the patient's current medications.  Labs:  Results for orders placed or performed during the hospital encounter of 07/07/21 (from the past 48 hour(s))  Glucose, capillary     Status: Abnormal   Collection Time: 07/08/21  5:38 PM  Result Value Ref Range   Glucose-Capillary 111 (H) 70 - 99 mg/dL    Comment: Glucose reference range applies only to samples taken after fasting for at least 8 hours.  Hepatic function panel     Status: Abnormal   Collection Time: 07/08/21  8:31 PM  Result Value Ref Range   Total Protein 5.8 (L) 6.5 - 8.1 g/dL   Albumin 3.1 (L) 3.5 - 5.0 g/dL   AST 28 15 - 41 U/L   ALT 16 0 - 44 U/L   Alkaline Phosphatase 71 38 - 126 U/L   Total Bilirubin 0.8 0.3 - 1.2 mg/dL   Bilirubin, Direct 0.2 0.0 - 0.2 mg/dL   Indirect Bilirubin 0.6 0.3 - 0.9 mg/dL    Comment: Performed at [facility], [address] [place], [place] [address]  Glucose, capillary     Status: Abnormal   Collection Time: 07/08/21 11:06 PM  Result Value Ref Range   Glucose-Capillary 106 (H) 70 - 99 mg/dL    Comment: Glucose reference range applies only to samples taken after fasting for at least 8 hours.  Lipase, blood     Status: None   Collection Time: 07/09/21  4:56 AM  Result Value Ref Range   Lipase 26 11 - 51 U/L    Comment: Performed at [facility], [address], [place], [place] [address]  CBC with Differential/Platelet     Status: Abnormal   Collection Time:  07/09/21  4:56 AM  Result Value Ref Range   WBC 21.6 (H) 4.0 - 10.5 K/uL   RBC 3.81 (L) 3.87 - 5.11 MIL/uL   Hemoglobin 11.4 (L) 12.0 - 15.0 g/dL   HCT 85.2 (L) 77.8 - 24.2 %   MCV 92.7 80.0 - 100.0 fL   MCH 29.9 26.0 - 34.0 pg   MCHC 32.3 30.0 - 36.0 g/dL   RDW 35.3 61.4 - 43.1 %   Platelets 145 (L) 150 - 400 K/uL   nRBC 0.0 0.0 - 0.2 %   Neutrophils Relative % 87 %   Neutro Abs 19.0 (H) 1.7 - 7.7 K/uL   Lymphocytes Relative 2 %   Lymphs Abs 0.4 (L) 0.7 - 4.0 K/uL   Monocytes Relative 9 %   Monocytes Absolute 1.9 (H) 0.1 - 1.0 K/uL   Eosinophils Relative 0 %   Eosinophils Absolute 0.0 0.0 - 0.5 K/uL   Basophils Relative 1 %   Basophils Absolute 0.1 0.0 - 0.1 K/uL   Immature Granulocytes 1 %   Abs Immature Granulocytes 0.17 (H) 0.00 - 0.07 K/uL    Comment: Performed at [facility], [address], [place], [place] [address]  Comprehensive metabolic panel     Status: Abnormal   Collection Time: 07/09/21  4:56 AM  Result Value Ref Range   Sodium 133 (L) 135 - 145 mmol/L   Potassium 4.1 3.5 - 5.1 mmol/L   Chloride 101 98 - 111 mmol/L   CO2 26 22 - 32 mmol/L   Glucose, Bld 97 70 - 99 mg/dL    Comment: Glucose reference range applies only to samples taken after fasting for at least 8 hours.   BUN 39 (H) 8 - 23 mg/dL   Creatinine, Ser 6.76 (H) 0.44 - 1.00 mg/dL   Calcium 9.1 8.9 - 19.5 mg/dL   Total Protein 5.6 (L) 6.5 - 8.1 g/dL   Albumin 2.9 (L) 3.5 - 5.0 g/dL   AST 30 15 - 41 U/L   ALT 15 0 - 44 U/L   Alkaline Phosphatase 61 38 - 126 U/L   Total Bilirubin 0.5 0.3 - 1.2 mg/dL   GFR, Estimated 45 (L) >60 mL/min    Comment: (NOTE) Calculated using the CKD-EPI Creatinine Equation (2021)    Anion gap 6 5 - 15    Comment: Performed at [facility], [address] [place], [place] [address]  Glucose, capillary     Status: Abnormal   Collection Time: 07/09/21  5:17 AM  Result Value Ref Range   Glucose-Capillary 102 (H) 70 - 99 mg/dL     Comment: Glucose reference range applies only to samples taken after fasting for at least 8 hours.  Glucose, capillary     Status: Abnormal   Collection Time: 07/09/21 11:41 AM  Result Value Ref Range   Glucose-Capillary 118 (H) 70 - 99 mg/dL    Comment: Glucose reference range applies only to samples taken after fasting for at least 8 hours.  Lactic acid, plasma     Status: Abnormal   Collection Time: 07/09/21  3:24 PM  Result Value Ref Range   Lactic Acid, Venous 2.2 (HH) 0.5 - 1.9 mmol/L    Comment: CRITICAL RESULT CALLED TO, READ BACK BY AND VERIFIED WITHArgentina Ponder RN AT 1606 07/09/21 MULLINS,T Performed at [facility], [address] [place], [place] [address]  Basic metabolic panel     Status: Abnormal   Collection Time: 07/09/21  3:24 PM  Result Value Ref Range   Sodium 133 (L) 135 - 145 mmol/L   Potassium 4.0 3.5 - 5.1 mmol/L   Chloride 100 98 - 111 mmol/L   CO2 24 22 - 32 mmol/L   Glucose, Bld 122 (H) 70 - 99 mg/dL    Comment: Glucose reference range applies only to samples taken after fasting for at least 8 hours.   BUN 43 (H) 8 - 23 mg/dL   Creatinine, Ser 6.60 (H) 0.44 - 1.00 mg/dL   Calcium 9.4 8.9 - 60.0 mg/dL   GFR, Estimated 34 (L) >60 mL/min    Comment: (NOTE) Calculated using the CKD-EPI Creatinine Equation (2021)    Anion gap 9 5 - 15    Comment: Performed at [facility], [address], [place], [place] [address]  I-STAT 7, (LYTES, BLD GAS, ICA, H+H)     Status: Abnormal   Collection Time: 07/09/21  6:36 PM  Result Value Ref Range   pH, Arterial 7.396 7.350 - 7.450   pCO2 arterial 37.1 32.0 - 48.0 mmHg   pO2, Arterial 446 (H) 83.0 - 108.0 mmHg   Bicarbonate 22.8 20.0 - 28.0 mmol/L   TCO2 24 22 - 32 mmol/L   O2 Saturation 100.0 %   Acid-base deficit 2.0 0.0 - 2.0 mmol/L   Sodium 139 135 - 145 mmol/L   Potassium 3.2 (L) 3.5 - 5.1 mmol/L   Calcium, Ion 1.21 1.15 - 1.40 mmol/L   HCT 24.0 (L) 36.0 - 46.0 %    Hemoglobin 8.2 (L) 12.0 - 15.0 g/dL   Sample type ARTERIAL   MRSA Next Gen by PCR, Nasal     Status: None   Collection Time: 07/09/21 10:39 PM   Specimen: Nasal Mucosa; Nasal Swab  Result Value Ref Range   MRSA by PCR Next Gen NOT DETECTED NOT DETECTED    Comment: (NOTE) The GeneXpert MRSA Assay (FDA approved for NASAL specimens only), is one component of a comprehensive MRSA colonization surveillance program. It is not intended to diagnose MRSA infection nor to guide or monitor treatment for MRSA infections. Test performance is not FDA approved in patients less than 52 years old. Performed at [facility], [address], [place], [place] [address]   Glucose, capillary     Status: Abnormal   Collection Time: 07/10/21 12:05 AM  Result Value Ref Range   Glucose-Capillary 120 (H) 70 - 99 mg/dL    Comment: Glucose reference range applies only to samples taken after fasting for at least 8 hours.  CBC     Status: Abnormal   Collection Time: 07/10/21  5:22 AM  Result Value Ref Range   WBC 14.1 (H) 4.0 - 10.5 K/uL   RBC 3.33 (L) 3.87 - 5.11 MIL/uL   Hemoglobin 10.0 (L) 12.0 - 15.0 g/dL   HCT 39.5 (L) 32.0 - 23.3 %   MCV 92.5 80.0 - 100.0 fL   MCH 30.0 26.0 - 34.0 pg   MCHC 32.5 30.0 - 36.0 g/dL   RDW 43.5 68.6 - 16.8 %   Platelets 152 150 - 400 K/uL   nRBC 0.0 0.0 - 0.2 %    Comment: Performed at [facility], [address] [place], [place] [address]  Basic metabolic panel     Status: Abnormal   Collection Time: 07/10/21  5:22 AM  Result Value Ref Range  Sodium 136 135 - 145 mmol/L   Potassium 4.4 3.5 - 5.1 mmol/L    Comment: DELTA CHECK NOTED   Chloride 104 98 - 111 mmol/L   CO2 25 22 - 32 mmol/L   Glucose, Bld 108 (H) 70 - 99 mg/dL    Comment: Glucose reference range applies only to samples taken after fasting for at least 8 hours.   BUN 50 (H) 8 - 23 mg/dL   Creatinine, Ser 2.77 (H) 0.44 - 1.00 mg/dL   Calcium 8.9 8.9 - 82.4 mg/dL    GFR, Estimated 30 (L) >60 mL/min    Comment: (NOTE) Calculated using the CKD-EPI Creatinine Equation (2021)    Anion gap 7 5 - 15    Comment: Performed at [facility], [address] [place], [place] [address]  Glucose, capillary     Status: None   Collection Time: 07/10/21  6:07 AM  Result Value Ref Range   Glucose-Capillary 98 70 - 99 mg/dL    Comment: Glucose reference range applies only to samples taken after fasting for at least 8 hours.  Lactic acid, plasma     Status: None   Collection Time: 07/10/21  7:03 AM  Result Value Ref Range   Lactic Acid, Venous 1.1 0.5 - 1.9 mmol/L    Comment: Performed at [facility], [address], [place], [place] [address]  Brain natriuretic peptide     Status: Abnormal   Collection Time: 07/10/21  7:03 AM  Result Value Ref Range   B Natriuretic Peptide 396.4 (H) 0.0 - 100.0 pg/mL    Comment: Performed at [facility], [address], [place], [place] [address]  Glucose, capillary     Status: Abnormal   Collection Time: 07/10/21 11:27 AM  Result Value Ref Range   Glucose-Capillary 100 (H) 70 - 99 mg/dL    Comment: Glucose reference range applies only to samples taken after fasting for at least 8 hours.     ROS:  Pertinent items noted in HPI and rest of the review of system unable to obtain as patient is very hard of hearing.  Physical Exam: Vitals:   07/10/21 1300 07/10/21 1400  BP: 116/85 94/67  Pulse: (!) 115 96  Resp: 17 17  Temp:    SpO2: 100% 100%     General exam: Critically ill looking female lying on bed with dry and crusty oral mucous membrane. Respiratory system: Clear to auscultation anteriorly, no increased work of breathing Cardiovascular system: S1 & S2 heard, RRR.  No pedal edema. Gastrointestinal system: Abdomen is soft and has dressing applied Central nervous system: Alert and awake.  Hard of hearing. Extremities: No edema, no cyanosis. Skin: No rashes,  lesions or ulcers Psychiatry: Unable to assess.   Assessment/Plan:  #Acute kidney injury, oliguric likely ischemic ATN in the setting of hypotension/surgery/sepsis concomitant with the use of IV contrast.  UA with E. coli UTI and some protein.  Currently receiving IV ceftriaxone. I will order bladder scan and kidney ultrasound. Agree with continuing IV fluid, I will increase the rate to 125 cc an hour.  The patient received a dose of Lasix by Surgery team for oliguria. Recommend continuing strict ins and out, daily lab. Avoid nephrotoxins including IV contrast unless absolutely necessary.  #Acute abdomen/embolic mesenteric ischemia: Status post ex lap with a right colectomy and ileal resection for mesenteric ischemia.  Currently has NG tube, IV fluid, antibiotics coverage.  General surgery team is following.  #E.  coli UTI: Covered by ceftriaxone.  #Hyponatremia, hypovolemic: Improved with IV hydration.  #Hypokalemia: The potassium level improved.  Monitor lab.  #Acute metabolic encephalopathy: Risk for delirium.  Also hard of hearing.  Discussed with the nurse and with the primary team.  We will continue to follow.  Thank you for the consult.  Linden Tagliaferro Jaynie Collins 07/10/2021, 3:26 PM  [facility].

## 2021-07-10 NOTE — Evaluation (Addendum)
Physical Therapy Evaluation Patient Details Name: Debbie Hampton MRN: [id number] DOB: Sep 23, 1931 Today's Date: 07/10/2021  History of Present Illness  87 year old female, SNF resident, extremely hard of hearing, medical history significant for chronic atrial fibrillation on Eliquis, bilateral carotid artery disease, CAD, GERD, hiatal hernia, HTN, HLD, nonhemorrhagic stroke, depression, type II DM, Kyphoplasty 06/05/21, sent from SNF due to rectal bleeding, nausea, vomiting and abdominal pain.   Progressed to acute abdomen with peritonitis.  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  Clinical Impression  Patient's son/POA, present for information re: PLOF as pt. Unable.  Patient comes from SNF after kyphoplasty 9/ 20/ 22.  The patient had lucid periods, oriented to [facility] ,then [facility] and Dtr-in-law works at [employer]. Patient easily self distracted by lines and tubes and talking. Patient did assist with moving legs, max assistance to sit up, sat for ~ 5 minutes on bed edge.  HR max in 120', Spo2 > 90  on 4 L [place].  Patient will benefit from return to SNF and continued PT while in acute care.   Recommendations for follow up therapy are one component of a multi-disciplinary discharge planning process, led by the attending physician.  Recommendations may be updated based on patient status, additional functional criteria and insurance authorization.  Follow Up Recommendations Skilled nursing-short term rehab (<3 hours/day)    Assistance Recommended at Discharge Frequent or constant Supervision/Assistance  Functional Status Assessment Patient has had a recent decline in their functional status and demonstrates the ability to make significant improvements in function in a reasonable and predictable amount of time.  Equipment Recommendations  None recommended by PT    Recommendations  for Other Services       Precautions / Restrictions Precautions Precautions: Fall Precaution Comments: NG suction, ABD incision      Mobility  Bed Mobility Overal bed mobility: Needs Assistance Bed Mobility: Supine to Sit;Sit to Supine     Supine to sit: Max assist Sit to supine: Total assist   General bed mobility comments: with cues, patient wlaked legs towards bed edge, required max assistance and bed  pad to sit up and scoot to edge. Total assistance  to return to supine.    Transfers                   General transfer comment: NT    Ambulation/Gait                Stairs            Wheelchair Mobility    Modified Rankin (Stroke Patients Only)       Balance Overall balance assessment: Needs assistance Sitting-balance support: Bilateral upper extremity supported;Feet supported Sitting balance-Leahy Scale: Poor   Postural control: Posterior lean Standing balance support: Bilateral upper extremity supported                                 Pertinent Vitals/Pain Facial Expression: Tense Body Movements: Restlessness Muscle Tension: Relaxed Compliance with ventilator (intubated pts.): N/A Vocalization (extubated pts.): Sighing, moaning CPOT Total: 4    Home Living Family/patient expects to be discharged to:: Skilled nursing facility                        Prior Function Prior Level of Function : Needs assist  Mobility Comments: per son, ambulated x 8' at rehab with RW, required assist with ADL's       Hand Dominance        Extremity/Trunk Assessment        Lower Extremity Assessment Lower Extremity Assessment: Generalized weakness    Cervical / Trunk Assessment Cervical / Trunk Assessment: Kyphotic  Communication      Cognition Arousal/Alertness: Awake/alert Behavior During Therapy: Restless;Anxious Overall Cognitive Status: Impaired/Different from baseline Area of Impairment:  Orientation;Attention;Following commands;Safety/judgement;Awareness                 Orientation Level: Time;Situation Current Attention Level: Focused   Following Commands: Follows one step commands inconsistently Safety/Judgement: Decreased awareness of deficits Awareness: Intellectual   General Comments: oriented to  [place], stated dtr inlaw works at [employer]. stated she had back surgery, intermittent decreased  alertness, eyes roving around, decreased focus. Patient fluctuating  with laugh and moaning. Voice high pitched which is different        General Comments      Exercises     Assessment/Plan    PT Assessment Patient needs continued PT services  PT Problem List Decreased strength;Decreased mobility;Decreased knowledge of precautions;Decreased activity tolerance;Decreased cognition;Decreased balance;Cardiopulmonary status limiting activity       PT Treatment Interventions DME instruction;Therapeutic activities;Cognitive remediation;Gait training;Therapeutic exercise;Patient/family education;Functional mobility training;Balance training    PT Goals (Current goals can be found in the Care Plan section)  Acute Rehab PT Goals Patient Stated Goal: to return to  ALF, walk PT Goal Formulation: With patient/family Time For Goal Achievement: 07/24/21 Potential to Achieve Goals: Fair    Frequency Min 2X/week   Barriers to discharge        Co-evaluation PT/OT/SLP Co-Evaluation/Treatment: Yes Reason for Co-Treatment: Complexity of the patient's impairments (multi-system involvement);For patient/therapist safety PT goals addressed during session: Mobility/safety with mobility OT goals addressed during session: ADL's and self-care       AM-PAC PT "6 Clicks" Mobility  Outcome Measure Help needed turning from your back to your side while in a flat bed without using bedrails?: A Lot Help needed moving from lying on your back to sitting on the side of a flat bed without using  bedrails?: Total Help needed moving to and from a bed to a chair (including a wheelchair)?: Total Help needed standing up from a chair using your arms (e.g., wheelchair or bedside chair)?: Total Help needed to walk in hospital room?: Total Help needed climbing 3-5 steps with a railing? : Total 6 Click Score: 7    End of Session   Activity Tolerance: Patient tolerated treatment well Patient left: in bed;with call bell/phone within reach;with bed alarm set;with nursing/sitter in room Nurse Communication: Mobility status PT Visit Diagnosis: Unsteadiness on feet (R26.81);Difficulty in walking, not elsewhere classified (R26.2);Muscle weakness (generalized) (M62.81);History of falling (Z91.81)    Time: 1351-1410 PT Time Calculation (min) (ACUTE ONLY): 19 min   Charges:   PT Evaluation $PT Eval Low Complexity: 1 Low          Blanchard Kelch PT Acute Rehabilitation Services Pager [phone] Office [phone]   Rada Hay 07/10/2021, 3:23 PM

## 2021-07-10 NOTE — Progress Notes (Signed)
Pt confused and removed NG tube. Spoke with CCS on call Dr. He advised to leave NG out for now and monitor for nausea, vomiting, and abd distention. Call CCS back if symptoms present.

## 2021-07-10 NOTE — Progress Notes (Signed)
ANTICOAGULATION CONSULT NOTE - Follow Up Consult  Pharmacy Consult for Heparin Indication: atrial fibrillation  Allergies  Allergen Reactions   Cheese     GOAT cheese   Mushroom Extract Complex    Penicillins     Has patient had a PCN reaction causing immediate rash, facial/tongue/throat swelling, SOB or lightheadedness with hypotension: Yes Has patient had a PCN reaction causing severe rash involving mucus membranes or skin necrosis: No Has patient had a PCN reaction that required hospitalization No Has patient had a PCN reaction occurring within the last 10 years: Yes If all of the above answers are "NO", then may proceed with Cephalosporin use.    Verapamil    Aloe Hives and Rash   Amlodipine Hives, Rash and Hypertension   Cephalexin Rash    Tolerated Ancef 09 of 2022   Metoprolol Hives, Palpitations and Hypertension    Succinate   Miconazole Swelling and Rash   Neosporin [Neomycin-Bacitracin Zn-Polymyx] Rash   Trandolapril-Verapamil Hcl Er Rash    Patient Measurements: Height: 5\' 2"  (157.5 cm) Weight: 59.9 kg (132 lb) IBW/kg (Calculated) : 50.1 Heparin Dosing Weight: TBW  Vital Signs: BP: 97/66 (10/25 0600) Pulse Rate: 108 (10/25 0600)  Labs: Recent Labs    07/08/21 0536 07/09/21 0456 07/09/21 1524 07/09/21 1836 07/10/21 0522  HGB 12.3 11.4*  --  8.2* 10.0*  HCT 37.9 35.3*  --  24.0* 30.8*  PLT 169 145*  --   --  152  CREATININE 0.80 1.19* 1.50*  --  1.64*    Estimated Creatinine Clearance: 19.5 mL/min (A) (by C-G formula based on SCr of 1.64 mg/dL (H)).   Medications:  Infusions:   sodium chloride Stopped (07/09/21 1613)   cefTRIAXone (ROCEPHIN)  IV 2 g (07/08/21 1809)   lactated ringers     lactated ringers     metronidazole Stopped (07/10/21 0437)    Assessment: 33 yoF with PMH of afib admitted for rectal bleeding and abdominal pain.  She was previously on apixaban, but held in September for rectal bleeding. Pharmacy is consulted to dose  Heparin IV on 10/25 postop.   S/p 10/24 diagnostic laparoscopy converted to exploratory laparotomy with right colectomy and ileal resection for mesenteric ischemia consistent with embolic pattern.  Today, 07/10/2021: CBC:  Hgb down to 10, Plt WNL SCr up to 1.64 (baseline SCr 0.6-0.8) No bleeding or complications reported  Goal of Therapy:  Heparin level 0.3-0.7 units/ml Monitor platelets by anticoagulation protocol: Yes   Plan:  Give heparin 2000 units bolus IV x 1 Start heparin IV infusion at 850 units/hr Heparin level 8 hours after starting Daily heparin level and CBC Continue to monitor H&H and platelets postop   07/12/2021 PharmD, BCPS Clinical Pharmacist [facility] main pharmacy [phone] 07/10/2021 9:27 AM

## 2021-07-10 NOTE — Progress Notes (Signed)
Pharmacy Consult Note - IV heparin  See previous heparin note today for full H&P  Labs: heparin level 0.34  A/P: Heparin level therapeutic (goal 0.3-0.7 for Afib) on current IV heparin rate of 850 units/hr. No reported bleeding or issues. Continue current heparin rate. Will recheck heparin level with AM labs  Hessie Knows, PharmD, BCPS Pharmacy [phone] 07/10/2021 8:39 PM

## 2021-07-10 NOTE — Progress Notes (Signed)
[facility] Gastroenterology Progress Note  Debbie Hampton 87 y.o. [birth year]   Subjective: Patient lying in bed, shouting. Talked with nurse patient has been confused, disoriented most of the night. Not receive any pain medications last night has received some this morning. Patient is difficult of hearing, but unable to have directed answers to questions from the patient.  Unable to answer where she is at or year.  Objective: Vital signs: Vitals:   07/10/21 1000 07/10/21 1134  BP: 112/75   Pulse: 90   Resp: 14   Temp:  97.6 F (36.4 C)  SpO2: 99%     Physical Exam: Gen: elderly, hard of hearing, confused, no acute distress HEENT: anicteric sclera, poor dentition Abd: Abdomen with dressing  Lab Results: Recent Labs    07/09/21 1524 07/09/21 1836 07/10/21 0522  NA 133* 139 136  K 4.0 3.2* 4.4  CL 100  --  104  CO2 24  --  25  GLUCOSE 122*  --  108*  BUN 43*  --  50*  CREATININE 1.50*  --  1.64*  CALCIUM 9.4  --  8.9   Recent Labs    07/08/21 2031 07/09/21 0456  AST 28 30  ALT 16 15  ALKPHOS 71 61  BILITOT 0.8 0.5  PROT 5.8* 5.6*  ALBUMIN 3.1* 2.9*   Recent Labs    07/09/21 0456 07/09/21 1836 07/10/21 0522  WBC 21.6*  --  14.1*  NEUTROABS 19.0*  --   --   HGB 11.4* 8.2* 10.0*  HCT 35.3* 24.0* 30.8*  MCV 92.7  --  92.5  PLT 145*  --  152     Assessment/Plan: 87 yo with oratory laparotomy 07/09/2021 consistent with mesenteric ischemia status post right colectomy and ileal resection. Currently being followed by general surgery Per nurse patient having decreased urine output, and poor NG tube output. White blood cell count trending down from 21.6-14.1 Hemoglobin 8.2-10 No evidence of continuing GI bleeding, at this time [facility] will sign off.  Please feel free to consult if any further questions with this patient.   Doree Albee 07/10/2021, 12:21 PM   [name], female   DOB: [birth year], 87 y.o.   MRN: [id number]

## 2021-07-11 DIAGNOSIS — N390 Urinary tract infection, site not specified: Secondary | ICD-10-CM | POA: Diagnosis not present

## 2021-07-11 DIAGNOSIS — I10 Essential (primary) hypertension: Secondary | ICD-10-CM

## 2021-07-11 DIAGNOSIS — I4891 Unspecified atrial fibrillation: Secondary | ICD-10-CM | POA: Diagnosis not present

## 2021-07-11 DIAGNOSIS — I700 Atherosclerosis of aorta: Secondary | ICD-10-CM | POA: Diagnosis not present

## 2021-07-11 DIAGNOSIS — K625 Hemorrhage of anus and rectum: Secondary | ICD-10-CM | POA: Diagnosis not present

## 2021-07-11 DIAGNOSIS — E1165 Type 2 diabetes mellitus with hyperglycemia: Secondary | ICD-10-CM

## 2021-07-11 DIAGNOSIS — I701 Atherosclerosis of renal artery: Secondary | ICD-10-CM

## 2021-07-11 LAB — BASIC METABOLIC PANEL
Anion gap: 10 (ref 5–15)
BUN: 57 mg/dL — ABNORMAL HIGH (ref 8–23)
CO2: 23 mmol/L (ref 22–32)
Calcium: 9 mg/dL (ref 8.9–10.3)
Chloride: 102 mmol/L (ref 98–111)
Creatinine, Ser: 1.64 mg/dL — ABNORMAL HIGH (ref 0.44–1.00)
GFR, Estimated: 30 mL/min — ABNORMAL LOW (ref >60)
Glucose, Bld: 99 mg/dL (ref 70–99)
Potassium: 4.1 mmol/L (ref 3.5–5.1)
Sodium: 135 mmol/L (ref 135–145)

## 2021-07-11 LAB — CBC
HCT: 31 % — ABNORMAL LOW (ref 36.0–46.0)
Hemoglobin: 9.3 g/dL — ABNORMAL LOW (ref 12.0–15.0)
MCH: 29.7 pg (ref 26.0–34.0)
MCHC: 30 g/dL (ref 30.0–36.0)
MCV: 99 fL (ref 80.0–100.0)
Platelets: 153 10*3/uL (ref 150–400)
RBC: 3.13 MIL/uL — ABNORMAL LOW (ref 3.87–5.11)
RDW: 14.2 % (ref 11.5–15.5)
WBC: 14.4 10*3/uL — ABNORMAL HIGH (ref 4.0–10.5)
nRBC: 0 % (ref 0.0–0.2)

## 2021-07-11 LAB — GLUCOSE, CAPILLARY
Glucose-Capillary: 102 mg/dL — ABNORMAL HIGH (ref 70–99)
Glucose-Capillary: 84 mg/dL (ref 70–99)
Glucose-Capillary: 88 mg/dL (ref 70–99)
Glucose-Capillary: 95 mg/dL (ref 70–99)
Glucose-Capillary: 97 mg/dL (ref 70–99)

## 2021-07-11 LAB — BRAIN NATRIURETIC PEPTIDE: B Natriuretic Peptide: 473.1 pg/mL — ABNORMAL HIGH (ref 0.0–100.0)

## 2021-07-11 LAB — HEPARIN LEVEL (UNFRACTIONATED): Heparin Unfractionated: 0.44 IU/mL (ref 0.30–0.70)

## 2021-07-11 MED ORDER — LORAZEPAM 2 MG/ML IJ SOLN
0.2500 mg | Freq: Once | INTRAMUSCULAR | Status: AC
  Administered 2021-07-11: 0.25 mg via INTRAVENOUS
  Filled 2021-07-11: qty 1

## 2021-07-11 MED ORDER — ORAL CARE MOUTH RINSE
15.0000 mL | Freq: Two times a day (BID) | OROMUCOSAL | Status: DC
  Administered 2021-07-11 – 2021-07-16 (×11): 15 mL via OROMUCOSAL

## 2021-07-11 MED ORDER — LACTATED RINGERS IV SOLN: INTRAVENOUS | Status: AC

## 2021-07-11 NOTE — Progress Notes (Signed)
Progress Note  2 Days Post-Op  Subjective: Drowsy. Arouses to voice and touch but does not follow commands or respond verbally. Moans with palpation of abdomen. Discussed with RN and she has had agitation and pain with ativan overnight and recent pain medication for pain   Objective: Vital signs in last 24 hours: Temp:  [97.6 F (36.4 C)-98.4 F (36.9 C)] 98.4 F (36.9 C) (10/26 0800) Pulse Rate:  [77-122] 88 (10/26 0600) Resp:  [11-25] 12 (10/26 0600) BP: (94-126)/(48-85) 117/69 (10/26 0600) SpO2:  [89 %-100 %] 96 % (10/26 0600) Last BM Date: 07/06/21  Intake/Output from previous day: 10/25 0701 - 10/26 0700 In: 2235.2 [I.V.:2035.7; IV Piggyback:199.4] Out: 1110 [Urine:1110] Intake/Output this shift: No intake/output data recorded.  PE: General: WD, female who is laying in bed in NAD HEENT: head is normocephalic, atraumatic.  Mouth is pink and moist Heart: regular, rate, and rhythm.  Palpable radial pulses bilaterally Lungs: CTAB, no wheezes, rhonchi, or rales noted.  Respiratory effort nonlabored on supplemental O2 via [place]o Abd: soft, ND, +BS, moans with palpation of abdomen. Midline incision with honeycomb bandage in place - staples visible intact without erythema or discharge MSK: all 4 extremities are symmetrical with no cyanosis, clubbing, or edema. GU: foley catheter with clear yellow urine Skin: warm and dry    Lab Results:  Recent Labs    07/10/21 0522 07/11/21 0515  WBC 14.1* 14.4*  HGB 10.0* 9.3*  HCT 30.8* 31.0*  PLT 152 153   BMET Recent Labs    07/10/21 0522 07/11/21 0515  NA 136 135  K 4.4 4.1  CL 104 102  CO2 25 23  GLUCOSE 108* 99  BUN 50* 57*  CREATININE 1.64* 1.64*  CALCIUM 8.9 9.0   PT/INR No results for input(s): LABPROT, INR in the last 72 hours. CMP     Component Value Date/Time   NA 135 07/11/2021 0515   NA 137 05/04/2014 1850   K 4.1 07/11/2021 0515   K 3.5 05/04/2014 1850   CL 102 07/11/2021 0515   CL 99 05/04/2014  1850   CO2 23 07/11/2021 0515   CO2 27 05/04/2014 1850   GLUCOSE 99 07/11/2021 0515   GLUCOSE 118 (H) 05/04/2014 1850   BUN 57 (H) 07/11/2021 0515   BUN 17 05/04/2014 1850   CREATININE 1.64 (H) 07/11/2021 0515   CREATININE 0.86 05/04/2014 1850   CALCIUM 9.0 07/11/2021 0515   CALCIUM 8.2 (L) 05/04/2014 1850   PROT 5.6 (L) 07/09/2021 0456   PROT 5.9 (L) 05/04/2014 1850   ALBUMIN 2.9 (L) 07/09/2021 0456   ALBUMIN 3.2 (L) 05/04/2014 1850   AST 30 07/09/2021 0456   AST 24 05/04/2014 1850   ALT 15 07/09/2021 0456   ALT 28 05/04/2014 1850   ALKPHOS 61 07/09/2021 0456   ALKPHOS 97 05/04/2014 1850   BILITOT 0.5 07/09/2021 0456   BILITOT 0.2 05/04/2014 1850   GFRNONAA 30 (L) 07/11/2021 0515   GFRNONAA >60 05/04/2014 1850   GFRAA >60 11/28/2019 0805   GFRAA >60 05/04/2014 1850   Lipase     Component Value Date/Time   LIPASE 26 07/09/2021 0456       Studies/Results: US RENAL  Result Date: 07/10/2021 CLINICAL DATA:  Acute kidney injury EXAM: RENAL / URINARY TRACT ULTRASOUND COMPLETE COMPARISON:  CT 07/07/2021 FINDINGS: Right Kidney: Renal measurements: 8.9 x 4 x 5 cm = volume: 90.2 mL. Cortex is slightly echogenic. No mass. Mild right renal pelvis dilatation without hydronephrosis Left Kidney: Renal  measurements: 8.1 x 3.8 x 4.5 cm = volume: 72.1 mL. Cortex slightly echogenic. No mass or hydronephrosis. Bladder: Decompressed by Foley catheter Other: None. IMPRESSION: Cortex appears slightly echogenic consistent with medical renal disease. No convincing hydronephrosis. Electronically Signed   By: Jasmine Pang M.D.   On: 07/10/2021 20:03   NM Hepato W/EF  Result Date: 07/09/2021 CLINICAL DATA:  Possible cholecystitis. EXAM: NUCLEAR MEDICINE HEPATOBILIARY IMAGING WITH GALLBLADDER EF TECHNIQUE: Sequential images of the abdomen were obtained out to 60 minutes following intravenous administration of radiopharmaceutical. After oral ingestion of Ensure, gallbladder ejection fraction was  determined. At 60 min, normal ejection fraction is greater than 33%. RADIOPHARMACEUTICALS:  5.4 mCi Tc-39m  Choletec IV COMPARISON:  Right upper quadrant ultrasound and CT abdomen pelvis dated July 07, 2021. FINDINGS: Prompt uptake and biliary excretion of activity by the liver is seen. Gallbladder activity is visualized, consistent with patency of cystic duct. Biliary activity passes into small bowel, consistent with patent common bile duct. Calculated gallbladder ejection fraction is 52%. (Normal gallbladder ejection fraction with Ensure is greater than 33%.) IMPRESSION: 1. Normal HIDA scan and gallbladder ejection fraction. Electronically Signed   By: Obie Dredge M.D.   On: 07/09/2021 11:32   DG Abd Portable 1V  Result Date: 07/09/2021 CLINICAL DATA:  Encounter for NG tube placement EXAM: PORTABLE ABDOMEN - 1 VIEW COMPARISON:  None. FINDINGS: NG tube extends into the proximal stomach. This portion of stomach is above the diaphragm consistent with hiatal hernia as seen on CT 07/07/2021. Mildly dilated loops central small bowel measure up to 3.5 cm. Gas the rectum. IMPRESSION: 1. NG tube within proximal stomach within a hiatal hernia. 2. Mildly dilated loops of central small bowel. Gas within the colon or rectum. Electronically Signed   By: Genevive Bi M.D.   On: 07/09/2021 15:47   DG Abd Portable 1V  Result Date: 07/09/2021 CLINICAL DATA:  Abdominal pain and tenderness EXAM: PORTABLE ABDOMEN - 1 VIEW COMPARISON:  None. FINDINGS: Mildly dilated gas-filled loops of small bowel. Air seen in the rectum. No supine evidence of free air. Partially visualized lungs demonstrate and moderate hiatal hernia bibasilar atelectasis. No acute osseous abnormality. IMPRESSION: Mildly dilated gas-filled loops of small bowel with air seen in the rectum, findings can be seen in the setting of early or partial small bowel obstruction. Electronically Signed   By: Allegra Lai M.D.   On: 07/09/2021 15:14     Anti-infectives: Anti-infectives (From admission, onward)    Start     Dose/Rate Route Frequency Ordered Stop   07/08/21 1800  cefTRIAXone (ROCEPHIN) 2 g in sodium chloride 0.9 % 100 mL IVPB        2 g 200 mL/hr over 30 Minutes Intravenous Every 24 hours 07/08/21 1407     07/08/21 1500  metroNIDAZOLE (FLAGYL) IVPB 500 mg        500 mg 100 mL/hr over 60 Minutes Intravenous Every 12 hours 07/08/21 1407     07/07/21 0800  ciprofloxacin (CIPRO) IVPB 400 mg  Status:  Discontinued        400 mg 200 mL/hr over 60 Minutes Intravenous 2 times daily 07/07/21 0733 07/08/21 1407   07/07/21 0730  ciprofloxacin (CIPRO) IVPB 400 mg  Status:  Discontinued        400 mg 200 mL/hr over 60 Minutes Intravenous  Once 07/07/21 0727 07/07/21 0733   07/07/21 0730  metroNIDAZOLE (FLAGYL) IVPB 500 mg        500 mg 100 mL/hr over  60 Minutes Intravenous  Once 07/07/21 0727 07/07/21 1104        Assessment/Plan Ms. Haney is an 87 year old female who presented with atrial fibrillation on eliquis who presented with abdominal pain concerning at first for cholecystitis.  Workup was negative but her pain continued and she developed peritonitic signs on exam so she was taken for: dx lap converted to ex lap with R colectomy and ileum resection on 07/09/21 by Dr. Dossie Der, for mesenteric ischemia consistent with an embolic pattern.   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 POD#2 - 90 cm of small bowel left after the ligament of treitz, anastomosis to proximal transverse colon - Careful fluid balance - BNP 473.1. Over 6L net positive. May benefit from diuresis - Keep foley for strict I/O - NGT out over night. Okay to leave out. Okay to start on clears from surgical perspective but mental status obviously limiting - Awaiting return of bowel function - expect continued improvement from surgery but palliative consult could be helpful considering her acute illness and delirium.  Will call family today to provide surgical update and discuss - son [name] is POA  FEN: NPO, IVF. Okay for sips of clears from surgical perspective ID: ceftriaxone/flagyl VTE: heparin gtt   Remainder of care per primary team   LOS: 4 days    Eric Form, [facility] 07/11/2021, 8:10 AM Please see Amion for pager number during day hours 7:00am-4:30pm

## 2021-07-11 NOTE — Progress Notes (Addendum)
Chaplain provided ministry of presence.  Patient was somewhat agitated, but quickly resting due to some medication administered prior to Chaplain arrival.   Chaplain spoke with her son, [name], and provided prayer with him and his wife by phone.  [name] also asked that I pray in the room with his mother, which I am glad to do.  Chaplain Dyanne Carrel, [facility] Pager, [phone] 3:30 PM

## 2021-07-11 NOTE — Progress Notes (Signed)
[facility] PROGRESS NOTE  Assessment/ Plan: Pt is a 87 y.o. yo female  with PMH of hypertension, HLD, DM, nonhemorrhagic stroke, anxiety depression, CAD, A. fib, acid reflux, SNF resident who presented with rectal bleeding, nausea vomiting and abdominal pain, s/p ex-lap surgery, seen as a consultation for the evaluation of acute kidney injury.  #Acute kidney injury, non-oliguric: likely ischemic ATN in the setting of hypotension/surgery/sepsis concomitant with the use of IV contrast.  UA with E. coli UTI and some protein.  Currently receiving IV ceftriaxone.Kidney US with no acute finding/no hydro.  Urine output around 1.1 L and creatinine remained stable today.  She pulled out NG tube and currently NPO, I will continue gentle IV hydration. Recommend continuing strict ins and out, daily lab. Avoid nephrotoxins including IV contrast unless absolutely necessary.   #Acute abdomen/embolic mesenteric ischemia: Status post ex lap with a right colectomy and ileal resection for mesenteric ischemia.  CNPO, IV fluid, antibiotics coverage.  General surgery team is following.   #E. coli UTI: Covered by ceftriaxone.   #Hyponatremia, hypovolemic: Improved with IV hydration.   #Hypokalemia: The potassium level improved.  Monitor lab.   #Acute metabolic encephalopathy/acute delirius and hard of hearing.  Continue supportive care.  Noted palliative care was consulted to discuss goals of care.  Discussed with ICU nurse.  Subjective: Seen and examined in ICU.  She remains confused and delirious.  Urine output around 1.1 L in 24 hours and then around 300 cc this afternoon.  Blood pressure acceptable.  She pulled out NG tube.  No other event. Objective Vital signs in last 24 hours: Vitals:   07/11/21 1300 07/11/21 1400 07/11/21 1500 07/11/21 1600  BP: (!) 143/70 127/74 (!) 138/101 (!) 132/111  Pulse: (!) 103 (!) 110 (!) 105 (!) 101  Resp: 16 15 20 18   Temp:      TempSrc:       SpO2: 95% 96% 97% 94%  Weight:      Height:       Weight change:   Intake/Output Summary (Last 24 hours) at 07/11/2021 1623 Last data filed at 07/11/2021 1522 Gross per 24 hour  Intake 2526.5 ml  Output 1110 ml  Net 1416.5 ml       Labs: Basic Metabolic Panel: Recent Labs  Lab 07/09/21 1524 07/09/21 1836 07/10/21 0522 07/11/21 0515  NA 133* 139 136 135  K 4.0 3.2* 4.4 4.1  CL 100  --  104 102  CO2 24  --  25 23  GLUCOSE 122*  --  108* 99  BUN 43*  --  50* 57*  CREATININE 1.50*  --  1.64* 1.64*  CALCIUM 9.4  --  8.9 9.0   Liver Function Tests: Recent Labs  Lab 07/08/21 0536 07/08/21 2031 07/09/21 0456  AST 30 28 30   ALT 19 16 15   ALKPHOS 74 71 61  BILITOT 0.6 0.8 0.5  PROT 5.9* 5.8* 5.6*  ALBUMIN 3.3* 3.1* 2.9*   Recent Labs  Lab 07/07/21 0126 07/08/21 0536 07/09/21 0456  LIPASE 68* 53* 26   No results for input(s): AMMONIA in the last 168 hours. CBC: Recent Labs  Lab 07/07/21 0126 07/07/21 2016 07/08/21 0536 07/09/21 0456 07/09/21 1836 07/10/21 0522 07/11/21 0515  WBC 17.6*  --  19.6* 21.6*  --  14.1* 14.4*  NEUTROABS  --   --   --  19.0*  --   --   --   HGB 14.8   < > 12.3 11.4* 8.2*  10.0* 9.3*  HCT 44.0  --  37.9 35.3* 24.0* 30.8* 31.0*  MCV 88.2  --  90.9 92.7  --  92.5 99.0  PLT 220  --  169 145*  --  152 153   < > = values in this interval not displayed.   Cardiac Enzymes: No results for input(s): CKTOTAL, CKMB, CKMBINDEX, TROPONINI in the last 168 hours. CBG: Recent Labs  Lab 07/10/21 1127 07/10/21 1743 07/11/21 0000 07/11/21 0601 07/11/21 1212  GLUCAP 100* 94 102* 95 84    Iron Studies: No results for input(s): IRON, TIBC, TRANSFERRIN, FERRITIN in the last 72 hours. Studies/Results: US RENAL  Result Date: 07/10/2021 CLINICAL DATA:  Acute kidney injury EXAM: RENAL / URINARY TRACT ULTRASOUND COMPLETE COMPARISON:  CT 07/07/2021 FINDINGS: Right Kidney: Renal measurements: 8.9 x 4 x 5 cm = volume: 90.2 mL. Cortex is  slightly echogenic. No mass. Mild right renal pelvis dilatation without hydronephrosis Left Kidney: Renal measurements: 8.1 x 3.8 x 4.5 cm = volume: 72.1 mL. Cortex slightly echogenic. No mass or hydronephrosis. Bladder: Decompressed by Foley catheter Other: None. IMPRESSION: Cortex appears slightly echogenic consistent with medical renal disease. No convincing hydronephrosis. Electronically Signed   By: Jasmine Pang M.D.   On: 07/10/2021 20:03    Medications: Infusions:  sodium chloride Stopped (07/09/21 1613)   cefTRIAXone (ROCEPHIN)  IV Stopped (07/10/21 1810)   heparin 850 Units/hr (07/11/21 1522)   metronidazole Stopped (07/11/21 1501)    Scheduled Medications:  Chlorhexidine Gluconate Cloth  6 each Topical Daily   diclofenac Sodium  2 g Topical QID   mouth rinse  15 mL Mouth Rinse BID   metoprolol tartrate  5 mg Intravenous Q6H   pantoprazole (PROTONIX) IV  40 mg Intravenous Daily    have reviewed scheduled and prn medications.  Physical Exam: General: Elderly confused female lying on bed with dry oral mucous membrane Heart:RRR, s1s2 nl Lungs:clear b/l, no crackle Abdomen:soft, dressing on. Extremities:No leg edema Neurology: Alert awake and confused.  Debbie Hampton 07/11/2021,4:23 PM  LOS: 4 days

## 2021-07-11 NOTE — Progress Notes (Signed)
PROGRESS NOTE    Debbie Hampton  ZOX:[id number] DOB: May 10, 1932 DOA: 07/07/2021 PCP: Kandyce Rud, MD   Brief Narrative: Debbie Hampton is a 87 y.o. female, SNF resident, extremely hard of hearing, medical history significant for chronic atrial fibrillation on Eliquis, bilateral carotid artery disease, CAD, GERD, hiatal hernia, HTN, HLD, nonhemorrhagic stroke, depression, type II DM, sent from SNF due to rectal bleeding, nausea, vomiting and abdominal pain.  Admitted for possible low volume rectal bleed likely related to hemorrhoids, main issue however was concern for acute acalculous cholecystitis.  [facility] and general surgery were consulted.  Eventually HIDA scan negative.  Progressed to acute abdomen with peritonitis.  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   Assessment & Plan:   Principal Problem:   Rectal bleeding Active Problems:   Hyponatremia   Atrial fibrillation (HCC)   Hypertension   Compression fracture of T11 vertebra (HCC)   Hypothyroidism   Type 2 diabetes mellitus with hyperglycemia (HCC)   Dilated gallbladder   Acute lower UTI   Elevated lipase   Renal artery stenosis (HCC)   HLD (hyperlipidemia)   Aortic atherosclerosis (HCC)   Acute peritonitis Embolic mesenteric ischemia Patient presented with concern for acute cholecystitis. HIDA scan negative. Patient subsequently worsened with evident of acute abdomen with peritonitis. Patient underwent diagnostic laparoscopy converted to exploratory laparotomy with right colectomy/ileal resection for embolic mesenteric ischemia with primary anastomosis to proximal transverse colon. -General surgery recommendations: Clear liquid diet once mental status improves  Post-op ileus Persistent. No bowel movement yet. NG tube removed by patient. -Surgery recommendations: clears pending  improvement of mental status -Continue IV fluids per nephrology  Sepsis Not present on admission. Secondary to mesenteric ischemia. IV fluids and antibiotics started. No blood cultures available. Physiology resolved.  Rectal bleeding Likely related to hemorrhoidal bleeding. Resolved.   E. Coli acute cystitis Confirmed on urine culture. Patient started on Ciprofloxacin IV which was transitioned to Ceftriaxone IV. -Continue Ceftriaxone IV  Hyponatremia Mild. Likely related to dehydration. Resolved with IV fluids.  Dehydration Likely secondary to GI losses. Started on IV fluids.  Oliguric AKI Baseline creatinine of about 0.8-0.9. Creatinine of 1.08 on admission with peak of 1.64. Possibly related to ATN. Nephrology consulted with recommendations for increased IV fluids. Creatinine stabilized. UOP of 2.24L over the last 24 hours -Nephrology recommendations (10/26): continue IV fluids  Acute metabolic encephalopathy Unknown baseline but concern this may be related to dehydration vs delirium. Patient is receiving morphine for pain control in addition to Arivan overnight for agitation. -Management of above issues and watch for improvement -Delirium precautions -Will need to confirm cognitive baseline  Chronic atrial fibrillation Atenolol held secondary to NPO status. Started on metoprolol IV. Heparin IV restarted in place of low-dose Eliquis -Continue metoprolol and Heparin IV  Acute anemia Thought to be secondary to acute illness/post-op blood loss and hemodilution. Does not appear to have acute bleeding. CT angio obtained on 10/22 without source for bleed.  Leukocytosis Secondary to mesenteric ischemia. Peak WBC of 21,600 and is now improving.   Primary hypertension History of left renal artery stenosis on atenolol and lisinopril. Started on metoprolol while NPO.  History of T11 compression fracture Stable.  Hypothyroidism No outpatient medications noted. Unsure of diagnosis.  Cannot confirm at this time. TSH normal.  Diabetes mellitus, type 2 Controlled. Hemoglobin A1C of 5.6%.  Hyperlipidemia Aortic atherosclerosis Patient is on Crestor as an outpatient.  Hearing loss Noted.  Moderate sliding hiatal hernia GERD Noted.  Bilateral pleural effusions Noted on CT scan. Moderate R>L. Layering.    DVT prophylaxis: Heparin IV Code Status:   Code Status: DNR Family Communication: Called son,  however no answer Disposition Plan: Discharge pending improvement of mental status, advancement of diet, improvement of AKI, specialist recommendations for discharge.   Consultants:  General surgery [facility]  Procedures:  RIGHT COLECTOMY WITH RESECTION OF THE ILEUM AND PART OF THE JEJUNUM (07/11/2021)  Antimicrobials: Ceftriaxone Ciprofloxacin Flagyl    Subjective: Patient unable to provide history.  Objective: Vitals:   07/11/21 0500 07/11/21 0556 07/11/21 0600 07/11/21 0800  BP: (!) 116/59 126/77 117/69   Pulse: 77  88   Resp: 12  12   Temp:    98.4 F (36.9 C)  TempSrc:    Axillary  SpO2: (!) 89%  96%   Weight:      Height:        Intake/Output Summary (Last 24 hours) at 07/11/2021 0815 Last data filed at 07/11/2021 0620 Gross per 24 hour  Intake 2235.15 ml  Output 1110 ml  Net 1125.15 ml   Filed Weights   07/07/21 0143  Weight: 59.9 kg    Examination:  General exam: Appears calm and comfortable and in no acute distress. Not conversant HEENT: Pupils normal sized, equal and reactive. Respiratory: Clear to auscultation. Respiratory effort normal with no intercostal retractions or use of accessory muscles Cardiovascular: S1 & S2 heard, RRR. No murmurs, rubs, gallops or clicks. No edema Gastrointestinal: Abdomen is non-distended, soft and generally tender. No masses felt. Normal bowel sounds heard. Midline honeycomb dressing over underlying incision is clean and intact. Neurologic: No focal neurological  deficits Musculoskeletal: No calf tenderness Skin: No cyanosis. No new rashes Psychiatry: Arouses to verbal stimulation briefly. Moans and opens her eyes to painful exam of abdomen. Does not follow commands or answer questions.    Data Reviewed: I have personally reviewed following labs and imaging studies  CBC Lab Results  Component Value Date   WBC 14.4 (H) 07/11/2021   RBC 3.13 (L) 07/11/2021   HGB 9.3 (L) 07/11/2021   HCT 31.0 (L) 07/11/2021   MCV 99.0 07/11/2021   MCH 29.7 07/11/2021   PLT 153 07/11/2021   MCHC 30.0 07/11/2021   RDW 14.2 07/11/2021   LYMPHSABS 0.4 (L) 07/09/2021   MONOABS 1.9 (H) 07/09/2021   EOSABS 0.0 07/09/2021   BASOSABS 0.1 07/09/2021     Last metabolic panel Lab Results  Component Value Date   NA 135 07/11/2021   K 4.1 07/11/2021   CL 102 07/11/2021   CO2 23 07/11/2021   BUN 57 (H) 07/11/2021   CREATININE 1.64 (H) 07/11/2021   GLUCOSE 99 07/11/2021   GFRNONAA 30 (L) 07/11/2021   GFRAA >60 11/28/2019   CALCIUM 9.0 07/11/2021   PHOS 3.7 08/22/2018   PROT 5.6 (L) 07/09/2021   ALBUMIN 2.9 (L) 07/09/2021   BILITOT 0.5 07/09/2021   ALKPHOS 61 07/09/2021   AST 30 07/09/2021   ALT 15 07/09/2021   ANIONGAP 10 07/11/2021    CBG (last 3)  Recent Labs    07/10/21 1743 07/11/21 0000 07/11/21 0601  GLUCAP 94 102* 95     GFR: Estimated Creatinine Clearance: 19.5 mL/min (A) (by C-G formula based on SCr of 1.64 mg/dL (H)).  Coagulation Profile: Recent Labs  Lab 07/07/21 0732  INR 1.0    Recent Results (from the past 240 hour(s))  Urine Culture  Status: Abnormal   Collection Time: 07/07/21  2:50 AM   Specimen: Urine, Clean Catch  Result Value Ref Range Status   Specimen Description   Final    URINE, CLEAN CATCH Performed at [facility], [address] [place], [place] [address]    Special Requests   Final    NONE Performed at [facility], [address] [place], [place] [address]     Culture >=100,000 COLONIES/mL ESCHERICHIA COLI (A)  Final   Report Status 07/09/2021 FINAL  Final   Organism ID, Bacteria ESCHERICHIA COLI (A)  Final      Susceptibility   Escherichia coli - MIC*    AMPICILLIN >=32 RESISTANT Resistant     CEFAZOLIN <=4 SENSITIVE Sensitive     CEFEPIME <=0.12 SENSITIVE Sensitive     CEFTRIAXONE <=0.25 SENSITIVE Sensitive     CIPROFLOXACIN <=0.25 SENSITIVE Sensitive     GENTAMICIN <=1 SENSITIVE Sensitive     IMIPENEM <=0.25 SENSITIVE Sensitive     NITROFURANTOIN <=16 SENSITIVE Sensitive     TRIMETH/SULFA >=320 RESISTANT Resistant     AMPICILLIN/SULBACTAM 16 INTERMEDIATE Intermediate     PIP/TAZO <=4 SENSITIVE Sensitive     * >=100,000 COLONIES/mL ESCHERICHIA COLI  Resp Panel by RT-PCR (Flu A&B, Covid) Nasopharyngeal Swab     Status: None   Collection Time: 07/07/21  7:42 AM   Specimen: Nasopharyngeal Swab; Nasopharyngeal(NP) swabs in vial transport medium  Result Value Ref Range Status   SARS Coronavirus 2 by RT PCR NEGATIVE NEGATIVE Final    Comment: (NOTE) SARS-CoV-2 target nucleic acids are NOT DETECTED.  The SARS-CoV-2 RNA is generally detectable in upper respiratory specimens during the acute phase of infection. The lowest concentration of SARS-CoV-2 viral copies this assay can detect is 138 copies/mL. A negative result does not preclude SARS-Cov-2 infection and should not be used as the sole basis for treatment or other patient management decisions. A negative result may occur with  improper specimen collection/handling, submission of specimen other than nasopharyngeal swab, presence of viral mutation(s) within the areas targeted by this assay, and inadequate number of viral copies(<138 copies/mL). A negative result must be combined with clinical observations, patient history, and epidemiological information. The expected result is Negative.  Fact Sheet for Patients:  BloggerCourse.com  Fact Sheet for Healthcare  Providers:  SeriousBroker.it  This test is no t yet approved or cleared by the [place] FDA and  has been authorized for detection and/or diagnosis of SARS-CoV-2 by FDA under an Emergency Use Authorization (EUA). This EUA will remain  in effect (meaning this test can be used) for the duration of the COVID-19 declaration under Section 564(b)(1) of the Act, 21 U.S.C.section 360bbb-3(b)(1), unless the authorization is terminated  or revoked sooner.       Influenza A by PCR NEGATIVE NEGATIVE Final   Influenza B by PCR NEGATIVE NEGATIVE Final    Comment: (NOTE) The Xpert Xpress SARS-CoV-2/FLU/RSV plus assay is intended as an aid in the diagnosis of influenza from Nasopharyngeal swab specimens and should not be used as a sole basis for treatment. Nasal washings and aspirates are unacceptable for Xpert Xpress SARS-CoV-2/FLU/RSV testing.  Fact Sheet for Patients: BloggerCourse.com  Fact Sheet for Healthcare Providers: SeriousBroker.it  This test is not yet approved or cleared by the [place] FDA and has been authorized for detection and/or diagnosis of SARS-CoV-2 by FDA under an Emergency Use Authorization (EUA). This EUA will remain in effect (meaning this test can be used) for the  duration of the COVID-19 declaration under Section 564(b)(1) of the Act, 21 U.S.C. section 360bbb-3(b)(1), unless the authorization is terminated or revoked.  Performed at [facility], [address] [place], [place] [address]   MRSA Next Gen by PCR, Nasal     Status: None   Collection Time: 07/09/21 10:39 PM   Specimen: Nasal Mucosa; Nasal Swab  Result Value Ref Range Status   MRSA by PCR Next Gen NOT DETECTED NOT DETECTED Final    Comment: (NOTE) The GeneXpert MRSA Assay (FDA approved for NASAL specimens only), is one component of a comprehensive MRSA colonization surveillance program. It is  not intended to diagnose MRSA infection nor to guide or monitor treatment for MRSA infections. Test performance is not FDA approved in patients less than 54 years old. Performed at [facility], [address], [place], [place] [address]         Radiology Studies: US RENAL  Result Date: 07/10/2021 CLINICAL DATA:  Acute kidney injury EXAM: RENAL / URINARY TRACT ULTRASOUND COMPLETE COMPARISON:  CT 07/07/2021 FINDINGS: Right Kidney: Renal measurements: 8.9 x 4 x 5 cm = volume: 90.2 mL. Cortex is slightly echogenic. No mass. Mild right renal pelvis dilatation without hydronephrosis Left Kidney: Renal measurements: 8.1 x 3.8 x 4.5 cm = volume: 72.1 mL. Cortex slightly echogenic. No mass or hydronephrosis. Bladder: Decompressed by Foley catheter Other: None. IMPRESSION: Cortex appears slightly echogenic consistent with medical renal disease. No convincing hydronephrosis. Electronically Signed   By: Jasmine Pang M.D.   On: 07/10/2021 20:03   NM Hepato W/EF  Result Date: 07/09/2021 CLINICAL DATA:  Possible cholecystitis. EXAM: NUCLEAR MEDICINE HEPATOBILIARY IMAGING WITH GALLBLADDER EF TECHNIQUE: Sequential images of the abdomen were obtained out to 60 minutes following intravenous administration of radiopharmaceutical. After oral ingestion of Ensure, gallbladder ejection fraction was determined. At 60 min, normal ejection fraction is greater than 33%. RADIOPHARMACEUTICALS:  5.4 mCi Tc-46m  Choletec IV COMPARISON:  Right upper quadrant ultrasound and CT abdomen pelvis dated July 07, 2021. FINDINGS: Prompt uptake and biliary excretion of activity by the liver is seen. Gallbladder activity is visualized, consistent with patency of cystic duct. Biliary activity passes into small bowel, consistent with patent common bile duct. Calculated gallbladder ejection fraction is 52%. (Normal gallbladder ejection fraction with Ensure is greater than 33%.) IMPRESSION: 1. Normal HIDA scan and  gallbladder ejection fraction. Electronically Signed   By: Obie Dredge M.D.   On: 07/09/2021 11:32   DG Abd Portable 1V  Result Date: 07/09/2021 CLINICAL DATA:  Encounter for NG tube placement EXAM: PORTABLE ABDOMEN - 1 VIEW COMPARISON:  None. FINDINGS: NG tube extends into the proximal stomach. This portion of stomach is above the diaphragm consistent with hiatal hernia as seen on CT 07/07/2021. Mildly dilated loops central small bowel measure up to 3.5 cm. Gas the rectum. IMPRESSION: 1. NG tube within proximal stomach within a hiatal hernia. 2. Mildly dilated loops of central small bowel. Gas within the colon or rectum. Electronically Signed   By: Genevive Bi M.D.   On: 07/09/2021 15:47   DG Abd Portable 1V  Result Date: 07/09/2021 CLINICAL DATA:  Abdominal pain and tenderness EXAM: PORTABLE ABDOMEN - 1 VIEW COMPARISON:  None. FINDINGS: Mildly dilated gas-filled loops of small bowel. Air seen in the rectum. No supine evidence of free air. Partially visualized lungs demonstrate and moderate hiatal hernia bibasilar atelectasis. No acute osseous abnormality. IMPRESSION: Mildly dilated gas-filled loops of small bowel with air seen in the  rectum, findings can be seen in the setting of early or partial small bowel obstruction. Electronically Signed   By: Allegra Lai M.D.   On: 07/09/2021 15:14        Scheduled Meds:  Chlorhexidine Gluconate Cloth  6 each Topical Daily   diclofenac Sodium  2 g Topical QID   metoprolol tartrate  5 mg Intravenous Q6H   pantoprazole (PROTONIX) IV  40 mg Intravenous Daily   Continuous Infusions:  sodium chloride Stopped (07/09/21 1613)   cefTRIAXone (ROCEPHIN)  IV Stopped (07/10/21 1810)   heparin 850 Units/hr (07/11/21 0620)   lactated ringers 125 mL/hr at 07/11/21 0620   metronidazole Stopped (07/11/21 0407)     LOS: 4 days     Jacquelin Hawking, MD [facility] 07/11/2021, 8:15 AM  If 7PM-7AM, please contact  night-coverage [email or web address]

## 2021-07-11 NOTE — Progress Notes (Signed)
ANTICOAGULATION CONSULT NOTE - Follow Up Consult  Pharmacy Consult for Heparin Indication: atrial fibrillation  Allergies  Allergen Reactions   Cheese     GOAT cheese   Mushroom Extract Complex    Penicillins     Has patient had a PCN reaction causing immediate rash, facial/tongue/throat swelling, SOB or lightheadedness with hypotension: Yes Has patient had a PCN reaction causing severe rash involving mucus membranes or skin necrosis: No Has patient had a PCN reaction that required hospitalization No Has patient had a PCN reaction occurring within the last 10 years: Yes If all of the above answers are "NO", then may proceed with Cephalosporin use.    Verapamil    Aloe Hives and Rash   Amlodipine Hives, Rash and Hypertension   Cephalexin Rash    Tolerated Ancef 09 of 2022   Metoprolol Hives, Palpitations and Hypertension    Succinate   Miconazole Swelling and Rash   Neosporin [Neomycin-Bacitracin Zn-Polymyx] Rash   Trandolapril-Verapamil Hcl Er Rash    Patient Measurements: Height: 5\' 2"  (157.5 cm) Weight: 59.9 kg (132 lb) IBW/kg (Calculated) : 50.1 Heparin Dosing Weight: TBW  Vital Signs: Temp: 98.1 F (36.7 C) (10/26 0000) Temp Source: Axillary (10/26 0000) BP: 126/77 (10/26 0556) Pulse Rate: 77 (10/26 0500)  Labs: Recent Labs    07/09/21 0456 07/09/21 1524 07/09/21 1836 07/10/21 0522 07/10/21 1926 07/11/21 0515  HGB 11.4*  --  8.2* 10.0*  --   --   HCT 35.3*  --  24.0* 30.8*  --   --   PLT 145*  --   --  152  --   --   HEPARINUNFRC  --   --   --   --  0.34 0.44  CREATININE 1.19* 1.50*  --  1.64*  --   --      Estimated Creatinine Clearance: 19.5 mL/min (A) (by C-G formula based on SCr of 1.64 mg/dL (H)).   Medications:  Infusions:   sodium chloride Stopped (07/09/21 1613)   cefTRIAXone (ROCEPHIN)  IV Stopped (07/10/21 1810)   heparin 850 Units/hr (07/11/21 0406)   lactated ringers 125 mL/hr at 07/11/21 0406   metronidazole Stopped (07/11/21 0407)     Assessment: 18 yoF with PMH of afib admitted for rectal bleeding and abdominal pain.  She was previously on apixaban, but held in September for rectal bleeding. Pharmacy is consulted to dose Heparin IV on 10/25 postop.   S/p 10/24 diagnostic laparoscopy converted to exploratory laparotomy with right colectomy and ileal resection for mesenteric ischemia consistent with embolic pattern.  Today, 07/11/2021: Heparin level 0.44- therapeutic on IV heparin at 850 units/hr CBC:  Hgb down to 9.3, Plt WNL SCr elevated/stable at 1.64 (baseline SCr 0.6-0.8) No bleeding or complications reported by RN  Goal of Therapy:  Heparin level 0.3-0.7 units/ml Monitor platelets by anticoagulation protocol: Yes   Plan:  Continue heparin IV infusion at 850 units/hr Daily heparin level and CBC while on heparin Continue to monitor H&H and platelets postop F/U to resume apixaban once clinically appropriate  07/13/2021 PharmD, BCPS Clinical Pharmacist [facility] main pharmacy [phone] 07/11/2021 6:01 AM

## 2021-07-11 NOTE — TOC Initial Note (Signed)
Transition of Care [facility]) - Initial/Assessment Note    Patient Details  Name: Debbie Hampton MRN: [id number] Date of Birth: 12-May-1932  Transition of [facility]) CM/SW Contact:    Elliot Gault, LCSW Phone Number: 07/11/2021, 9:42 AM  Clinical Narrative:                  Pt admitted from [facility]. It appears pt first went to [facility] on 06/07/21. PT/OT recommending return to SNF at dc. MD notes indicate that Palliative consult may be helpful due to pt's current medical condition.   TOC will follow and assist with dc planning once further discussions are had with pt's son/POA by MD and Palliative team.  Expected Discharge Plan: Skilled Nursing Facility Barriers to Discharge: Continued Medical Work up   Patient Goals and CMS Choice        Expected Discharge Plan and Services Expected Discharge Plan: Skilled Nursing Facility In-house Referral: Clinical Social Work     Living arrangements for the past 2 months: Skilled Nursing Facility                                      Prior Living Arrangements/Services Living arrangements for the past 2 months: Skilled Nursing Facility Lives with:: Facility Resident Patient language and need for interpreter reviewed:: Yes        Need for Family Participation in Patient Care: Yes (Comment) Care giver support system in place?: Yes (comment)   Criminal Activity/Legal Involvement Pertinent to Current Situation/Hospitalization: No - Comment as needed  Activities of Daily Living Home Assistive Devices/Equipment: Blood pressure cuff, Eyeglasses, Grab bars around toilet, Grab bars in shower, Hand-held shower hose, Walker (specify type), Hospital bed, Scales, Wheelchair ADL Screening (condition at time of admission) Patient's cognitive ability adequate to safely complete daily activities?: No Is the patient deaf or have difficulty hearing?: No Does the patient have difficulty seeing, even when wearing glasses/contacts?: No Does the  patient have difficulty concentrating, remembering, or making decisions?: Yes Patient able to express need for assistance with ADLs?: Yes Does the patient have difficulty dressing or bathing?: Yes Independently performs ADLs?: No Communication: Independent Dressing (OT): Needs assistance Is this a change from baseline?: Pre-admission baseline Grooming: Needs assistance Is this a change from baseline?: Pre-admission baseline Feeding: Needs assistance Is this a change from baseline?: Pre-admission baseline Bathing: Needs assistance Is this a change from baseline?: Pre-admission baseline Toileting: Needs assistance Is this a change from baseline?: Pre-admission baseline In/Out Bed: Needs assistance Is this a change from baseline?: Pre-admission baseline Walks in Home: Needs assistance Is this a change from baseline?: Pre-admission baseline Does the patient have difficulty walking or climbing stairs?: Yes (secondary to weakness) Weakness of Legs: Both Weakness of Arms/Hands: None  Permission Sought/Granted                  Emotional Assessment       Orientation: : Oriented to Self Alcohol / Substance Use: Not Applicable Psych Involvement: No (comment)  Admission diagnosis:  Rectal bleeding [K62.5] Gallbladder dilatation [K82.8] Urinary tract infection without hematuria, site unspecified [N39.0] Patient Active Problem List   Diagnosis Date Noted   Rectal bleeding 07/07/2021   Type 2 diabetes mellitus with hyperglycemia (HCC) 07/07/2021   Dilated gallbladder 07/07/2021   Acute lower UTI 07/07/2021   Elevated lipase 07/07/2021   Renal artery stenosis (HCC) 07/07/2021   HLD (hyperlipidemia)    Aortic atherosclerosis (  HCC)    Status post kyphoplasty 06/05/2021   Hypothyroidism 05/07/2021   Type 2 diabetes mellitus without complications (HCC) 05/07/2021   Compression fracture of body of thoracic vertebra (HCC) 05/07/2021   Hypertensive urgency 05/06/2021   Compression  fracture of T11 vertebra (HCC) 05/06/2021   History of stroke 12/06/2019   Atrial fibrillation, controlled (HCC) 12/06/2019   Atrial fibrillation (HCC)    Hypertension    Elevated troponin 11/26/2019   Closed Colles' fracture of left radius 10/03/2017   Labile blood pressure    Benign essential HTN    Hypertensive crisis    Hyponatremia    Prediabetes    Hypoalbuminemia due to protein-calorie malnutrition (HCC)    Acute blood loss anemia    Stroke (cerebrum) (HCC) 08/01/2017   Acute CVA (cerebrovascular accident) (HCC) 07/29/2017   Syncope, near 03/31/2016   Gastro-esophageal reflux disease without esophagitis 02/15/2014   PCP:  Kandyce Rud, MD Pharmacy:   [facility] - [place], [place] - [address] [place] [address] Phone: [phone] Fax: [phone]  [facility] [phone] - [place], [place] - [address] [place] [address] Phone: [phone] Fax: [phone]     Social Determinants of Health (SDOH) Interventions    Readmission Risk Interventions Readmission Risk Prevention Plan 07/11/2021  Transportation Screening Complete  HRI or Home Care Consult Complete  Palliative Care Screening Complete  Medication Review (RN Care Manager) Complete  Some recent data might be hidden

## 2021-07-11 NOTE — NC FL2 (Signed)
[place] MEDICAID FL2 LEVEL OF CARE SCREENING TOOL     IDENTIFICATION  Patient Name: Debbie Hampton Birthdate: [birth year] Sex: female Admission Date (Current Location): 07/07/2021  [facility] and [place] Number:  Producer, television/film/video and Address:  [facility],  [address] [place], [place] [address]      Provider Number: [id number]  Attending Physician Name and Address:  Narda Bonds, MD  Relative Name and Phone Number:       Current Level of Care: Hospital Recommended Level of Care: Skilled Nursing Facility Prior Approval Number:    Date Approved/Denied:   PASRR Number: [id number]  Discharge Plan: SNF    Current Diagnoses: Patient Active Problem List   Diagnosis Date Noted   Rectal bleeding 07/07/2021   Type 2 diabetes mellitus with hyperglycemia (HCC) 07/07/2021   Dilated gallbladder 07/07/2021   Acute lower UTI 07/07/2021   Elevated lipase 07/07/2021   Renal artery stenosis (HCC) 07/07/2021   HLD (hyperlipidemia)    Aortic atherosclerosis (HCC)    Status post kyphoplasty 06/05/2021   Hypothyroidism 05/07/2021   Type 2 diabetes mellitus without complications (HCC) 05/07/2021   Compression fracture of body of thoracic vertebra (HCC) 05/07/2021   Hypertensive urgency 05/06/2021   Compression fracture of T11 vertebra (HCC) 05/06/2021   History of stroke 12/06/2019   Atrial fibrillation, controlled (HCC) 12/06/2019   Atrial fibrillation (HCC)    Hypertension    Elevated troponin 11/26/2019   Closed Colles' fracture of left radius 10/03/2017   Labile blood pressure    Benign essential HTN    Hypertensive crisis    Hyponatremia    Prediabetes    Hypoalbuminemia due to protein-calorie malnutrition (HCC)    Acute blood loss anemia    Stroke (cerebrum) (HCC) 08/01/2017   Acute CVA (cerebrovascular accident) (HCC) 07/29/2017   Syncope, near 03/31/2016   Gastro-esophageal reflux disease without esophagitis 02/15/2014    Orientation RESPIRATION  BLADDER Height & Weight     Self  O2 (see dc summary) Incontinent Weight: 132 lb (59.9 kg) Height:  5\' 2"  (157.5 cm)  BEHAVIORAL SYMPTOMS/MOOD NEUROLOGICAL BOWEL NUTRITION STATUS      Incontinent Diet (see dc summary)  AMBULATORY STATUS COMMUNICATION OF NEEDS Skin   Extensive Assist Verbally Surgical wounds                       Personal Care Assistance Level of Assistance  Bathing, Feeding, Dressing Bathing Assistance: Maximum assistance Feeding assistance: Limited assistance Dressing Assistance: Maximum assistance     Functional Limitations Info  Sight, Hearing, Speech Sight Info: Adequate Hearing Info: Impaired Speech Info: Adequate    SPECIAL CARE FACTORS FREQUENCY  PT (By licensed PT), OT (By licensed OT)     PT Frequency: 5x week OT Frequency: 3x week            Contractures Contractures Info: Not present    Additional Factors Info  Code Status, Allergies, Psychotropic Code Status Info: DNR Allergies Info: Cheese, Mushroom Extract Complex, Penicillins, Verapamil, Aloe, Amlodipine, Cephalexin, Metoprolol, Miconazole, Neosporin (Neomycin-bacitracin Zn-polymyx), Trandolapril-verapamil Hcl Er Psychotropic Info: Celexa         Current Medications (07/11/2021):  This is the current hospital active medication list Current Facility-Administered Medications  Medication Dose Route Frequency Provider Last Rate Last Admin   0.9 %  sodium chloride infusion   Intravenous PRN 07/13/2021, MD   Stopped at 07/09/21 1613   acetaminophen (TYLENOL) tablet 650 mg  650 mg Oral Q6H PRN  Bobette Mo, MD       Or   acetaminophen (TYLENOL) suppository 650 mg  650 mg Rectal Q6H PRN Bobette Mo, MD       antiseptic oral rinse (BIOTENE) solution 15 mL  15 mL Mouth Rinse PRN Hongalgi, Maximino Greenland, MD       cefTRIAXone (ROCEPHIN) 2 g in sodium chloride 0.9 % 100 mL IVPB  2 g Intravenous Q24H Elease Etienne, MD   Stopped at 07/10/21 1810   Chlorhexidine Gluconate  Cloth 2 % PADS 6 each  6 each Topical Daily Elease Etienne, MD   6 each at 07/11/21 0214   diclofenac Sodium (VOLTAREN) 1 % topical gel 2 g  2 g Topical QID Bobette Mo, MD   2 g at 07/10/21 2200   heparin ADULT infusion 100 units/mL (25000 units/266mL)  850 Units/hr Intravenous Continuous Shade, Christine E, RPH 8.5 mL/hr at 07/11/21 0817 850 Units/hr at 07/11/21 0817   metoprolol tartrate (LOPRESSOR) injection 5 mg  5 mg Intravenous Q6H Hongalgi, Theadora Rama D, MD   5 mg at 07/11/21 0554   metroNIDAZOLE (FLAGYL) IVPB 500 mg  500 mg Intravenous Q12H Elease Etienne, MD   Stopped at 07/11/21 0407   morphine 2 MG/ML injection 2 mg  2 mg Intravenous Q3H PRN Bobette Mo, MD   2 mg at 07/11/21 0754   ondansetron (ZOFRAN) tablet 4 mg  4 mg Oral Q6H PRN Bobette Mo, MD       Or   ondansetron [facility] injection 4 mg  4 mg Intravenous Q6H PRN Bobette Mo, MD   4 mg at 07/09/21 1104   pantoprazole (PROTONIX) injection 40 mg  40 mg Intravenous Daily Bobette Mo, MD   40 mg at 07/10/21 1024   phenol (CHLORASEPTIC) mouth spray 1 spray  1 spray Mouth/Throat PRN Elease Etienne, MD         Discharge Medications: Please see discharge summary for a list of discharge medications.  Relevant Imaging Results:  Relevant Lab Results:   Additional Information SSN: [id number], LCSW

## 2021-07-12 DIAGNOSIS — Z515 Encounter for palliative care: Secondary | ICD-10-CM | POA: Diagnosis not present

## 2021-07-12 DIAGNOSIS — I700 Atherosclerosis of aorta: Secondary | ICD-10-CM | POA: Diagnosis not present

## 2021-07-12 DIAGNOSIS — Z7189 Other specified counseling: Secondary | ICD-10-CM

## 2021-07-12 DIAGNOSIS — N390 Urinary tract infection, site not specified: Secondary | ICD-10-CM | POA: Diagnosis not present

## 2021-07-12 DIAGNOSIS — K625 Hemorrhage of anus and rectum: Secondary | ICD-10-CM | POA: Diagnosis not present

## 2021-07-12 DIAGNOSIS — I4891 Unspecified atrial fibrillation: Secondary | ICD-10-CM | POA: Diagnosis not present

## 2021-07-12 LAB — CBC
HCT: 28.5 % — ABNORMAL LOW (ref 36.0–46.0)
Hemoglobin: 8.7 g/dL — ABNORMAL LOW (ref 12.0–15.0)
MCH: 29.6 pg (ref 26.0–34.0)
MCHC: 30.5 g/dL (ref 30.0–36.0)
MCV: 96.9 fL (ref 80.0–100.0)
Platelets: 152 10*3/uL (ref 150–400)
RBC: 2.94 MIL/uL — ABNORMAL LOW (ref 3.87–5.11)
RDW: 14 % (ref 11.5–15.5)
WBC: 10.1 10*3/uL (ref 4.0–10.5)
nRBC: 0.2 % (ref 0.0–0.2)

## 2021-07-12 LAB — GLUCOSE, CAPILLARY
Glucose-Capillary: 107 mg/dL — ABNORMAL HIGH (ref 70–99)
Glucose-Capillary: 98 mg/dL (ref 70–99)
Glucose-Capillary: 129 mg/dL — ABNORMAL HIGH (ref 70–99)
Glucose-Capillary: 101 mg/dL — ABNORMAL HIGH (ref 70–99)

## 2021-07-12 LAB — BASIC METABOLIC PANEL
Anion gap: 6 (ref 5–15)
BUN: 61 mg/dL — ABNORMAL HIGH (ref 8–23)
CO2: 25 mmol/L (ref 22–32)
Calcium: 9.1 mg/dL (ref 8.9–10.3)
Chloride: 108 mmol/L (ref 98–111)
Creatinine, Ser: 1.23 mg/dL — ABNORMAL HIGH (ref 0.44–1.00)
GFR, Estimated: 43 mL/min — ABNORMAL LOW (ref >60)
Glucose, Bld: 117 mg/dL — ABNORMAL HIGH (ref 70–99)
Potassium: 4.2 mmol/L (ref 3.5–5.1)
Sodium: 139 mmol/L (ref 135–145)

## 2021-07-12 LAB — HEPARIN LEVEL (UNFRACTIONATED): Heparin Unfractionated: 0.48 IU/mL (ref 0.30–0.70)

## 2021-07-12 LAB — BRAIN NATRIURETIC PEPTIDE: B Natriuretic Peptide: 582.2 pg/mL — ABNORMAL HIGH (ref 0.0–100.0)

## 2021-07-12 LAB — SURGICAL PATHOLOGY

## 2021-07-12 MED ORDER — LACTATED RINGERS IV SOLN: INTRAVENOUS | Status: DC

## 2021-07-12 MED ORDER — DEXTROSE-NACL 5-0.9 % IV SOLN
INTRAVENOUS | Status: DC
  Administered 2021-07-12: 30 mL/h via INTRAVENOUS

## 2021-07-12 MED ORDER — HYDRALAZINE HCL 20 MG/ML IJ SOLN
10.0000 mg | Freq: Four times a day (QID) | INTRAMUSCULAR | Status: DC | PRN
  Administered 2021-07-12 – 2021-07-16 (×5): 10 mg via INTRAVENOUS
  Filled 2021-07-12 (×6): qty 1

## 2021-07-12 NOTE — Progress Notes (Signed)
PROGRESS NOTE    Debbie Hampton  FKC:[id number] DOB: 1931-10-24 DOA: 07/07/2021 PCP: Kandyce Rud, MD   Brief Narrative: Debbie Hampton is a 87 y.o. female, SNF resident, extremely hard of hearing, medical history significant for chronic atrial fibrillation on Eliquis, bilateral carotid artery disease, CAD, GERD, hiatal hernia, HTN, HLD, nonhemorrhagic stroke, depression, type II DM, sent from SNF due to rectal bleeding, nausea, vomiting and abdominal pain.  Admitted for possible low volume rectal bleed likely related to hemorrhoids, main issue however was concern for acute acalculous cholecystitis.  [facility] and general surgery were consulted.  Eventually HIDA scan negative.  Progressed to acute abdomen with peritonitis.  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   Assessment & Plan:   Principal Problem:   Rectal bleeding Active Problems:   Hyponatremia   Atrial fibrillation (HCC)   Hypertension   Compression fracture of T11 vertebra (HCC)   Hypothyroidism   Type 2 diabetes mellitus with hyperglycemia (HCC)   Dilated gallbladder   Acute lower UTI   Elevated lipase   Renal artery stenosis (HCC)   HLD (hyperlipidemia)   Aortic atherosclerosis (HCC)   Acute peritonitis Embolic mesenteric ischemia Patient presented with concern for acute cholecystitis. HIDA scan negative. Patient subsequently worsened with evident of acute abdomen with peritonitis. Patient underwent diagnostic laparoscopy converted to exploratory laparotomy with right colectomy/ileal resection for embolic mesenteric ischemia with primary anastomosis to proximal transverse colon. -General surgery recommendations: Clear liquid diet once mental status improves  Post-op ileus Persistent. No bowel movement yet. NG tube removed by patient. -Surgery recommendations: clears pending  improvement of mental status -Continue IV fluids per nephrology  Sepsis Not present on admission. Secondary to mesenteric ischemia. IV fluids and antibiotics started. No blood cultures available. Physiology resolved.  Rectal bleeding Likely related to hemorrhoidal bleeding. Resolved.   E. Coli acute cystitis Confirmed on urine culture. Patient started on Ciprofloxacin IV which was transitioned to Ceftriaxone IV. -Continue Ceftriaxone IV  Hyponatremia Mild. Likely related to dehydration. Resolved with IV fluids.  Dehydration Likely secondary to GI losses. Started on IV fluids.  Oliguric AKI Baseline creatinine of about 0.8-0.9. Creatinine of 1.08 on admission with peak of 1.64. Possibly related to ATN. Nephrology consulted with recommendations for increased IV fluids. Creatinine stabilized. UOP of 720 mL over the last 24 hours. Creatinine down to 1.23 today. Nephrology signed of 10/27  Acute metabolic encephalopathy Unknown baseline but concern this may be related to dehydration vs delirium. Mental status appears to be improved this morning. -Management of above issues and watch for improvement -Delirium precautions  Chronic atrial fibrillation Atenolol held secondary to NPO status. Started on metoprolol IV. Heparin IV restarted in place of low-dose Eliquis -Continue metoprolol and Heparin IV -If tolerates oral, will transition to oral metoprolol  Acute anemia Thought to be secondary to acute illness/post-op blood loss and hemodilution. Does not appear to have acute bleeding. CT angio obtained on 10/22 without source for bleed.  Leukocytosis Secondary to mesenteric ischemia. Peak WBC of 21,600 and is now resolved.   Primary hypertension History of left renal artery stenosis on atenolol and lisinopril. Started on metoprolol while NPO.  History of T11 compression fracture Stable.  Hypothyroidism No outpatient medications noted. Unsure of diagnosis. Cannot confirm at this  time. TSH normal.  Diabetes mellitus, type 2 Controlled. Hemoglobin A1C of 5.6%.  Hyperlipidemia Aortic atherosclerosis Patient is on Crestor as an outpatient.  Hearing loss Noted.  Moderate sliding hiatal hernia GERD Noted.  Bilateral pleural effusions Noted on CT scan. Moderate R>L. Layering.   Goals of care Per general surgery discussion with family, Palliative Care consult placed. With regard to nutrition, desire to not place feeding tube or start TPN.   DVT prophylaxis: Heparin IV Code Status:   Code Status: DNR Family Communication: Called son (7:52 AM) with no response. Disposition Plan: Discharge pending improvement of mental status, advancement of diet, improvement of AKI, specialist recommendations for discharge.   Consultants:  General surgery [facility] Nephrology  Procedures:  RIGHT COLECTOMY WITH RESECTION OF THE ILEUM AND PART OF THE JEJUNUM (07/11/2021)  Antimicrobials: Ceftriaxone Ciprofloxacin Flagyl    Subjective: Patient not answering questions this morning.  Objective: Vitals:   07/12/21 0300 07/12/21 0326 07/12/21 0400 07/12/21 0500  BP: 131/75  (!) 147/96 (!) 131/45  Pulse: 87  91 83  Resp: 12  15 13   Temp:   97.6 F (36.4 C)   TempSrc:   Axillary   SpO2: 97%  96% 97%  Weight:  62.5 kg    Height:        Intake/Output Summary (Last 24 hours) at 07/12/2021 0708 Last data filed at 07/12/2021 0612 Gross per 24 hour  Intake 1684.43 ml  Output 720 ml  Net 964.43 ml    Filed Weights   07/07/21 0143 07/12/21 0326  Weight: 59.9 kg 62.5 kg    Examination:  General exam: Appears calm and comfortable Respiratory system: Clear to auscultation. Respiratory effort normal. Cardiovascular system: S1 & S2 heard, irregular rhythm with tachycardia. Gastrointestinal system: Abdomen is nondistended, soft and generally tender. No organomegaly or masses felt. Normal bowel sounds heard. Honeycomb dressing with some dried bloody  drainage noted. Central nervous system: Alert and not oriented. Musculoskeletal: No edema. No calf tenderness Skin: No cyanosis. No rashes Psychiatry: Judgement and insight appear impaired.   Data Reviewed: I have personally reviewed following labs and imaging studies  CBC Lab Results  Component Value Date   WBC 10.1 07/12/2021   RBC 2.94 (L) 07/12/2021   HGB 8.7 (L) 07/12/2021   HCT 28.5 (L) 07/12/2021   MCV 96.9 07/12/2021   MCH 29.6 07/12/2021   PLT 152 07/12/2021   MCHC 30.5 07/12/2021   RDW 14.0 07/12/2021   LYMPHSABS 0.4 (L) 07/09/2021   MONOABS 1.9 (H) 07/09/2021   EOSABS 0.0 07/09/2021   BASOSABS 0.1 07/09/2021     Last metabolic panel Lab Results  Component Value Date   NA 135 07/11/2021   K 4.1 07/11/2021   CL 102 07/11/2021   CO2 23 07/11/2021   BUN 57 (H) 07/11/2021   CREATININE 1.64 (H) 07/11/2021   GLUCOSE 99 07/11/2021   GFRNONAA 30 (L) 07/11/2021   GFRAA >60 11/28/2019   CALCIUM 9.0 07/11/2021   PHOS 3.7 08/22/2018   PROT 5.6 (L) 07/09/2021   ALBUMIN 2.9 (L) 07/09/2021   BILITOT 0.5 07/09/2021   ALKPHOS 61 07/09/2021   AST 30 07/09/2021   ALT 15 07/09/2021   ANIONGAP 10 07/11/2021    CBG (last 3)  Recent Labs    07/11/21 1710 07/11/21 2322 07/12/21 0546  GLUCAP 97 88 107*      GFR: Estimated Creatinine Clearance: 21.4 mL/min (A) (by C-G formula based on SCr of 1.64 mg/dL (H)).  Coagulation Profile: Recent Labs  Lab 07/07/21 0732  INR 1.0     Recent Results (from the past 240 hour(s))  Urine Culture  Status: Abnormal   Collection Time: 07/07/21  2:50 AM   Specimen: Urine, Clean Catch  Result Value Ref Range Status   Specimen Description   Final    URINE, CLEAN CATCH Performed at [facility], [address] [place], [place] [address]    Special Requests   Final    NONE Performed at [facility], [address] [place], [place] [address]    Culture >=100,000 COLONIES/mL  ESCHERICHIA COLI (A)  Final   Report Status 07/09/2021 FINAL  Final   Organism ID, Bacteria ESCHERICHIA COLI (A)  Final      Susceptibility   Escherichia coli - MIC*    AMPICILLIN >=32 RESISTANT Resistant     CEFAZOLIN <=4 SENSITIVE Sensitive     CEFEPIME <=0.12 SENSITIVE Sensitive     CEFTRIAXONE <=0.25 SENSITIVE Sensitive     CIPROFLOXACIN <=0.25 SENSITIVE Sensitive     GENTAMICIN <=1 SENSITIVE Sensitive     IMIPENEM <=0.25 SENSITIVE Sensitive     NITROFURANTOIN <=16 SENSITIVE Sensitive     TRIMETH/SULFA >=320 RESISTANT Resistant     AMPICILLIN/SULBACTAM 16 INTERMEDIATE Intermediate     PIP/TAZO <=4 SENSITIVE Sensitive     * >=100,000 COLONIES/mL ESCHERICHIA COLI  Resp Panel by RT-PCR (Flu A&B, Covid) Nasopharyngeal Swab     Status: None   Collection Time: 07/07/21  7:42 AM   Specimen: Nasopharyngeal Swab; Nasopharyngeal(NP) swabs in vial transport medium  Result Value Ref Range Status   SARS Coronavirus 2 by RT PCR NEGATIVE NEGATIVE Final    Comment: (NOTE) SARS-CoV-2 target nucleic acids are NOT DETECTED.  The SARS-CoV-2 RNA is generally detectable in upper respiratory specimens during the acute phase of infection. The lowest concentration of SARS-CoV-2 viral copies this assay can detect is 138 copies/mL. A negative result does not preclude SARS-Cov-2 infection and should not be used as the sole basis for treatment or other patient management decisions. A negative result may occur with  improper specimen collection/handling, submission of specimen other than nasopharyngeal swab, presence of viral mutation(s) within the areas targeted by this assay, and inadequate number of viral copies(<138 copies/mL). A negative result must be combined with clinical observations, patient history, and epidemiological information. The expected result is Negative.  Fact Sheet for Patients:  BloggerCourse.com  Fact Sheet for Healthcare Providers:   SeriousBroker.it  This test is no t yet approved or cleared by the [place] FDA and  has been authorized for detection and/or diagnosis of SARS-CoV-2 by FDA under an Emergency Use Authorization (EUA). This EUA will remain  in effect (meaning this test can be used) for the duration of the COVID-19 declaration under Section 564(b)(1) of the Act, 21 U.S.C.section 360bbb-3(b)(1), unless the authorization is terminated  or revoked sooner.       Influenza A by PCR NEGATIVE NEGATIVE Final   Influenza B by PCR NEGATIVE NEGATIVE Final    Comment: (NOTE) The Xpert Xpress SARS-CoV-2/FLU/RSV plus assay is intended as an aid in the diagnosis of influenza from Nasopharyngeal swab specimens and should not be used as a sole basis for treatment. Nasal washings and aspirates are unacceptable for Xpert Xpress SARS-CoV-2/FLU/RSV testing.  Fact Sheet for Patients: BloggerCourse.com  Fact Sheet for Healthcare Providers: SeriousBroker.it  This test is not yet approved or cleared by the [place] FDA and has been authorized for detection and/or diagnosis of SARS-CoV-2 by FDA under an Emergency Use Authorization (EUA). This EUA will remain in effect (meaning this test can be used) for the  duration of the COVID-19 declaration under Section 564(b)(1) of the Act, 21 U.S.C. section 360bbb-3(b)(1), unless the authorization is terminated or revoked.  Performed at [facility], [address], [place], [place] [address]   MRSA Next Gen by PCR, Nasal     Status: None   Collection Time: 07/09/21 10:39 PM   Specimen: Nasal Mucosa; Nasal Swab  Result Value Ref Range Status   MRSA by PCR Next Gen NOT DETECTED NOT DETECTED Final    Comment: (NOTE) The GeneXpert MRSA Assay (FDA approved for NASAL specimens only), is one component of a comprehensive MRSA colonization surveillance program. It is not intended  to diagnose MRSA infection nor to guide or monitor treatment for MRSA infections. Test performance is not FDA approved in patients less than 24 years old. Performed at [facility], [address] [place], [place] [address]          Radiology Studies: US RENAL  Result Date: 07/10/2021 CLINICAL DATA:  Acute kidney injury EXAM: RENAL / URINARY TRACT ULTRASOUND COMPLETE COMPARISON:  CT 07/07/2021 FINDINGS: Right Kidney: Renal measurements: 8.9 x 4 x 5 cm = volume: 90.2 mL. Cortex is slightly echogenic. No mass. Mild right renal pelvis dilatation without hydronephrosis Left Kidney: Renal measurements: 8.1 x 3.8 x 4.5 cm = volume: 72.1 mL. Cortex slightly echogenic. No mass or hydronephrosis. Bladder: Decompressed by Foley catheter Other: None. IMPRESSION: Cortex appears slightly echogenic consistent with medical renal disease. No convincing hydronephrosis. Electronically Signed   By: Jasmine Pang M.D.   On: 07/10/2021 20:03        Scheduled Meds:  Chlorhexidine Gluconate Cloth  6 each Topical Daily   diclofenac Sodium  2 g Topical QID   mouth rinse  15 mL Mouth Rinse BID   metoprolol tartrate  5 mg Intravenous Q6H   pantoprazole (PROTONIX) IV  40 mg Intravenous Daily   Continuous Infusions:  sodium chloride Stopped (07/09/21 1613)   cefTRIAXone (ROCEPHIN)  IV Stopped (07/11/21 1736)   heparin 850 Units/hr (07/12/21 0612)   lactated ringers 75 mL/hr at 07/12/21 0612   metronidazole Stopped (07/12/21 0417)     LOS: 5 days     Jacquelin Hawking, MD [facility] 07/12/2021, 7:08 AM  If 7PM-7AM, please contact night-coverage [email or web address]

## 2021-07-12 NOTE — Progress Notes (Signed)
ANTICOAGULATION CONSULT NOTE - Follow Up Consult  Pharmacy Consult for Heparin Indication: atrial fibrillation  Allergies  Allergen Reactions   Cheese     GOAT cheese   Mushroom Extract Complex    Penicillins     Has patient had a PCN reaction causing immediate rash, facial/tongue/throat swelling, SOB or lightheadedness with hypotension: Yes Has patient had a PCN reaction causing severe rash involving mucus membranes or skin necrosis: No Has patient had a PCN reaction that required hospitalization No Has patient had a PCN reaction occurring within the last 10 years: Yes If all of the above answers are "NO", then may proceed with Cephalosporin use.    Verapamil    Aloe Hives and Rash   Amlodipine Hives, Rash and Hypertension   Cephalexin Rash    Tolerated Ancef 09 of 2022   Metoprolol Hives, Palpitations and Hypertension    Succinate   Miconazole Swelling and Rash   Neosporin [Neomycin-Bacitracin Zn-Polymyx] Rash   Trandolapril-Verapamil Hcl Er Rash    Patient Measurements: Height: 5\' 2"  (157.5 cm) Weight: 62.5 kg (137 lb 12.6 oz) IBW/kg (Calculated) : 50.1 Heparin Dosing Weight: TBW  Vital Signs: Temp: 97.5 F (36.4 C) (10/27 0746) Temp Source: Axillary (10/27 0746) BP: 171/127 (10/27 0700) Pulse Rate: 97 (10/27 0700)  Labs: Recent Labs    07/09/21 1524 07/09/21 1836 07/10/21 0522 07/10/21 1926 07/11/21 0515 07/12/21 0318  HGB  --    < > 10.0*  --  9.3* 8.7*  HCT  --    < > 30.8*  --  31.0* 28.5*  PLT  --   --  152  --  153 152  HEPARINUNFRC  --   --   --  0.34 0.44 0.48  CREATININE 1.50*  --  1.64*  --  1.64*  --    < > = values in this interval not displayed.     Estimated Creatinine Clearance: 21.4 mL/min (A) (by C-G formula based on SCr of 1.64 mg/dL (H)).   Medications:  Infusions:   sodium chloride Stopped (07/09/21 1613)   cefTRIAXone (ROCEPHIN)  IV Stopped (07/11/21 1736)   heparin 850 Units/hr (07/12/21 0612)   lactated ringers 75 mL/hr at  07/12/21 0721   metronidazole Stopped (07/12/21 0417)    Assessment: 22 yoF with PMH of afib admitted for rectal bleeding and abdominal pain.  She was previously on apixaban, but held in September for rectal bleeding. Pharmacy is consulted to dose Heparin IV on 10/25 postop.   S/p 10/24 diagnostic laparoscopy converted to exploratory laparotomy with right colectomy and ileal resection for mesenteric ischemia consistent with embolic pattern.  Today, 07/12/2021: Heparin level 0.48, remains therapeutic on IV heparin at 850 units/hr CBC:  Hgb down to 8.7, Plt WNL SCr elevated/stable at 1.64 (baseline SCr 0.6-0.8) No bleeding or complications reported by RN   Goal of Therapy:  Heparin level 0.3-0.7 units/ml Monitor platelets by anticoagulation protocol: Yes   Plan:  Continue heparin IV infusion at 850 units/hr Daily heparin level and CBC while on heparin Continue to monitor H&H and platelets postop F/U long term anticoagulation plan   07/14/2021 PharmD, BCPS Clinical Pharmacist [facility] main pharmacy [phone] 07/12/2021 7:57 AM

## 2021-07-12 NOTE — Progress Notes (Signed)
Progress Note  3 Days Post-Op  Subjective: Mildly agitated but no acute distress and appears comfortable in bed. Repeats "Othel" over and over. Does not follow commands.    Objective: Vital signs in last 24 hours: Temp:  [97.6 F (36.4 C)-98.9 F (37.2 C)] 97.6 F (36.4 C) (10/27 0400) Pulse Rate:  [78-112] 83 (10/27 0500) Resp:  [11-22] 13 (10/27 0500) BP: (101-147)/(45-111) 131/45 (10/27 0500) SpO2:  [89 %-100 %] 97 % (10/27 0500) Weight:  [62.5 kg] 62.5 kg (10/27 0326) Last BM Date: 07/12/21  Intake/Output from previous day: 10/26 0701 - 10/27 0700 In: 1684.4 [I.V.:1384.5; IV Piggyback:299.9] Out: 720 [Urine:720] Intake/Output this shift: No intake/output data recorded.  PE: General: WD, female who is laying in bed in NAD HEENT: head is normocephalic, atraumatic.  Mouth is pink and moist Heart: regular, rate, and rhythm.  Palpable radial pulses bilaterally Lungs: CTAB, no wheezes, rhonchi, or rales noted.  Respiratory effort nonlabored on supplemental O2 via [place] Abd: soft, ND, +BS, no painful response/grimace to palpation of abdomen. Midline incision with honeycomb bandage removed - small amount of bleeding from midline wound. Staples intact without purulent drainage MSK: all 4 extremities are symmetrical GU: foley catheter with clear yellow urine Skin: warm and dry    Lab Results:  Recent Labs    07/11/21 0515 07/12/21 0318  WBC 14.4* 10.1  HGB 9.3* 8.7*  HCT 31.0* 28.5*  PLT 153 152    BMET Recent Labs    07/10/21 0522 07/11/21 0515  NA 136 135  K 4.4 4.1  CL 104 102  CO2 25 23  GLUCOSE 108* 99  BUN 50* 57*  CREATININE 1.64* 1.64*  CALCIUM 8.9 9.0    PT/INR No results for input(s): LABPROT, INR in the last 72 hours. CMP     Component Value Date/Time   NA 135 07/11/2021 0515   NA 137 05/04/2014 1850   K 4.1 07/11/2021 0515   K 3.5 05/04/2014 1850   CL 102 07/11/2021 0515   CL 99 05/04/2014 1850   CO2 23 07/11/2021 0515   CO2 27  05/04/2014 1850   GLUCOSE 99 07/11/2021 0515   GLUCOSE 118 (H) 05/04/2014 1850   BUN 57 (H) 07/11/2021 0515   BUN 17 05/04/2014 1850   CREATININE 1.64 (H) 07/11/2021 0515   CREATININE 0.86 05/04/2014 1850   CALCIUM 9.0 07/11/2021 0515   CALCIUM 8.2 (L) 05/04/2014 1850   PROT 5.6 (L) 07/09/2021 0456   PROT 5.9 (L) 05/04/2014 1850   ALBUMIN 2.9 (L) 07/09/2021 0456   ALBUMIN 3.2 (L) 05/04/2014 1850   AST 30 07/09/2021 0456   AST 24 05/04/2014 1850   ALT 15 07/09/2021 0456   ALT 28 05/04/2014 1850   ALKPHOS 61 07/09/2021 0456   ALKPHOS 97 05/04/2014 1850   BILITOT 0.5 07/09/2021 0456   BILITOT 0.2 05/04/2014 1850   GFRNONAA 30 (L) 07/11/2021 0515   GFRNONAA >60 05/04/2014 1850   GFRAA >60 11/28/2019 0805   GFRAA >60 05/04/2014 1850   Lipase     Component Value Date/Time   LIPASE 26 07/09/2021 0456       Studies/Results: US RENAL  Result Date: 07/10/2021 CLINICAL DATA:  Acute kidney injury EXAM: RENAL / URINARY TRACT ULTRASOUND COMPLETE COMPARISON:  CT 07/07/2021 FINDINGS: Right Kidney: Renal measurements: 8.9 x 4 x 5 cm = volume: 90.2 mL. Cortex is slightly echogenic. No mass. Mild right renal pelvis dilatation without hydronephrosis Left Kidney: Renal measurements: 8.1 x 3.8 x 4.5 cm =  volume: 72.1 mL. Cortex slightly echogenic. No mass or hydronephrosis. Bladder: Decompressed by Foley catheter Other: None. IMPRESSION: Cortex appears slightly echogenic consistent with medical renal disease. No convincing hydronephrosis. Electronically Signed   By: Jasmine Pang M.D.   On: 07/10/2021 20:03    Anti-infectives: Anti-infectives (From admission, onward)    Start     Dose/Rate Route Frequency Ordered Stop   07/08/21 1800  cefTRIAXone (ROCEPHIN) 2 g in sodium chloride 0.9 % 100 mL IVPB        2 g 200 mL/hr over 30 Minutes Intravenous Every 24 hours 07/08/21 1407     07/08/21 1500  metroNIDAZOLE (FLAGYL) IVPB 500 mg        500 mg 100 mL/hr over 60 Minutes Intravenous Every 12  hours 07/08/21 1407     07/07/21 0800  ciprofloxacin (CIPRO) IVPB 400 mg  Status:  Discontinued        400 mg 200 mL/hr over 60 Minutes Intravenous 2 times daily 07/07/21 0733 07/08/21 1407   07/07/21 0730  ciprofloxacin (CIPRO) IVPB 400 mg  Status:  Discontinued        400 mg 200 mL/hr over 60 Minutes Intravenous  Once 07/07/21 0727 07/07/21 0733   07/07/21 0730  metroNIDAZOLE (FLAGYL) IVPB 500 mg        500 mg 100 mL/hr over 60 Minutes Intravenous  Once 07/07/21 0727 07/07/21 1104        Assessment/Plan Ms. Bacha is an 87 year old female who presented with atrial fibrillation on eliquis who presented with abdominal pain concerning at first for cholecystitis.  Workup was negative but her pain continued and she developed peritonitic signs on exam so she was taken for: dx lap converted to ex lap with R colectomy and ileum resection on 07/09/21 by Dr. Dossie Der, for mesenteric ischemia consistent with an embolic pattern.   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 POD#3 - 90 cm of small bowel left after the ligament of treitz, anastomosis to proximal transverse colon - Careful fluid balance - BNP 582.2. Over 6L net positive. Creatinine pending today - hgb 8.7 (9.3) on heparin gtt. Continue to monitor - Keep foley for strict I/O - UOP 720 ml - NGT out. Okay to leave out. Okay for clears - would not advance beyond this until bowel function - Awaiting return of bowel function - discussed patient case at length with son [name] and daughter in law yesterday and they requested palliative consult - patient and son had discussed goals of care prior to this acute episode and patient expressed quality of life and independence are major values to her. At this point they do not want to consider cortrak or TPN if they become indicated  FEN: CLD, IVF. ID: ceftriaxone/flagyl VTE: heparin gtt   Remainder of care per primary team   LOS: 5 days    Eric Form, [facility] 07/12/2021, 7:19 AM Please see Amion for pager number during day hours 7:00am-4:30pm

## 2021-07-12 NOTE — Progress Notes (Signed)
[name], female   DOB: Mar 15, 1932, 87 y.o.   MRN: [id number] S: No events overnight.  Unresponsive to voice or tactile stimuli. O:BP (!) 174/113   Pulse (!) 102   Temp (!) 97.5 F (36.4 C) (Axillary)   Resp 14   Ht 5\' 2"  (1.575 m)   Wt 62.5 kg   SpO2 97%   BMI 25.20 kg/m   Intake/Output Summary (Last 24 hours) at 07/12/2021 1109 Last data filed at 07/12/2021 0853 Gross per 24 hour  Intake 1800.68 ml  Output 720 ml  Net 1080.68 ml   Intake/Output: I/O last 3 completed shifts: In: 3563.9 [I.V.:3164; IV Piggyback:399.9] Out: 1230 [Urine:1230]  Intake/Output this shift:  Total I/O In: 247.4 [I.V.:247.4] Out: -  Weight change:  07/14/2021 ill appearing female, unresponsive to voice or touch FWY:OVZCHYIFOYD at 102 Resp:CTA Abd: +BS, soft, no painful response to palpation Ext:no edema  Recent Labs  Lab 07/07/21 0126 07/08/21 0536 07/08/21 2031 07/09/21 0456 07/09/21 1524 07/09/21 1836 07/10/21 0522 07/11/21 0515 07/12/21 0733  NA 130* 131*  --  133* 133* 139 136 135 139  K 4.9 3.5  --  4.1 4.0 3.2* 4.4 4.1 4.2  CL 97* 98  --  101 100  --  104 102 108  CO2 24 25  --  26 24  --  25 23 25   GLUCOSE 186* 136*  --  97 122*  --  108* 99 117*  BUN 24* 26*  --  39* 43*  --  50* 57* 61*  CREATININE 0.63 0.80  --  1.19* 1.50*  --  1.64* 1.64* 1.23*  ALBUMIN 4.0 3.3* 3.1* 2.9*  --   --   --   --   --   CALCIUM 9.4 9.3  --  9.1 9.4  --  8.9 9.0 9.1  AST 52* 30 28 30   --   --   --   --   --   ALT 25 19 16 15   --   --   --   --   --    Liver Function Tests: Recent Labs  Lab 07/08/21 0536 07/08/21 2031 07/09/21 0456  AST 30 28 30   ALT 19 16 15   ALKPHOS 74 71 61  BILITOT 0.6 0.8 0.5  PROT 5.9* 5.8* 5.6*  ALBUMIN 3.3* 3.1* 2.9*   Recent Labs  Lab 07/07/21 0126 07/08/21 0536 07/09/21 0456  LIPASE 68* 53* 26   No results for input(s): AMMONIA in the last 168 hours. CBC: Recent Labs  Lab 07/08/21 0536 07/09/21 0456 07/09/21 1836 07/10/21 0522  07/11/21 0515 07/12/21 0318  WBC 19.6* 21.6*  --  14.1* 14.4* 10.1  NEUTROABS  --  19.0*  --   --   --   --   HGB 12.3 11.4*   < > 10.0* 9.3* 8.7*  HCT 37.9 35.3*   < > 30.8* 31.0* 28.5*  MCV 90.9 92.7  --  92.5 99.0 96.9  PLT 169 145*  --  152 153 152   < > = values in this interval not displayed.   Cardiac Enzymes: No results for input(s): CKTOTAL, CKMB, CKMBINDEX, TROPONINI in the last 168 hours. CBG: Recent Labs  Lab 07/11/21 0601 07/11/21 1212 07/11/21 1710 07/11/21 2322 07/12/21 0546  GLUCAP 95 84 97 88 107*    Iron Studies: No results for input(s): IRON, TIBC, TRANSFERRIN, FERRITIN in the last 72 hours. Studies/Results: 07/14/21 RENAL  Result Date: 07/10/2021 CLINICAL DATA:  Acute kidney injury  EXAM: RENAL / URINARY TRACT ULTRASOUND COMPLETE COMPARISON:  CT 07/07/2021 FINDINGS: Right Kidney: Renal measurements: 8.9 x 4 x 5 cm = volume: 90.2 mL. Cortex is slightly echogenic. No mass. Mild right renal pelvis dilatation without hydronephrosis Left Kidney: Renal measurements: 8.1 x 3.8 x 4.5 cm = volume: 72.1 mL. Cortex slightly echogenic. No mass or hydronephrosis. Bladder: Decompressed by Foley catheter Other: None. IMPRESSION: Cortex appears slightly echogenic consistent with medical renal disease. No convincing hydronephrosis. Electronically Signed   By: Jasmine Pang M.D.   On: 07/10/2021 20:03    Chlorhexidine Gluconate Cloth  6 each Topical Daily   diclofenac Sodium  2 g Topical QID   mouth rinse  15 mL Mouth Rinse BID   metoprolol tartrate  5 mg Intravenous Q6H   pantoprazole (PROTONIX) IV  40 mg Intravenous Daily    BMET    Component Value Date/Time   NA 139 07/12/2021 0733   NA 137 05/04/2014 1850   K 4.2 07/12/2021 0733   K 3.5 05/04/2014 1850   CL 108 07/12/2021 0733   CL 99 05/04/2014 1850   CO2 25 07/12/2021 0733   CO2 27 05/04/2014 1850   GLUCOSE 117 (H) 07/12/2021 0733   GLUCOSE 118 (H) 05/04/2014 1850   BUN 61 (H) 07/12/2021 0733   BUN 17 05/04/2014  1850   CREATININE 1.23 (H) 07/12/2021 0733   CREATININE 0.86 05/04/2014 1850   CALCIUM 9.1 07/12/2021 0733   CALCIUM 8.2 (L) 05/04/2014 1850   GFRNONAA 43 (L) 07/12/2021 0733   GFRNONAA >60 05/04/2014 1850   GFRAA >60 11/28/2019 0805   GFRAA >60 05/04/2014 1850   CBC    Component Value Date/Time   WBC 10.1 07/12/2021 0318   RBC 2.94 (L) 07/12/2021 0318   HGB 8.7 (L) 07/12/2021 0318   HGB 10.7 (L) 05/04/2014 1850   HCT 28.5 (L) 07/12/2021 0318   HCT 31.4 (L) 05/04/2014 1850   PLT 152 07/12/2021 0318   PLT 206 05/04/2014 1850   MCV 96.9 07/12/2021 0318   MCV 87 05/04/2014 1850   MCH 29.6 07/12/2021 0318   MCHC 30.5 07/12/2021 0318   RDW 14.0 07/12/2021 0318   RDW 12.8 05/04/2014 1850   LYMPHSABS 0.4 (L) 07/09/2021 0456   LYMPHSABS 1.8 05/04/2014 1850   MONOABS 1.9 (H) 07/09/2021 0456   MONOABS 0.5 05/04/2014 1850   EOSABS 0.0 07/09/2021 0456   EOSABS 0.1 05/04/2014 1850   BASOSABS 0.1 07/09/2021 0456   BASOSABS 0.0 05/04/2014 1850     Assessment/Plan:  AKI- non-oliguric presumably due to combination of IV contrast, as well as hemodynamically mediated ischemic ATN in setting of hypotension, surgery, and ABLA.  BUN/Cr improving with IVF's.  Continue with conservative therapy for now and avoid further contrast exposure.  Avoid nephrotoxic agents such as NSAIDs, phosphorous containing bowel preps (FLEETS), and renal dose medications.  Nothing further to add and will sign off for now.  Please call with any questions or concerns.  Acute abdomen/embolic mesenteric ischemia - s/p exploratory lap with right colectomy and ileal resection for mesenteric ischemia.  Surgery following E Coli UTI on ceftriaxone Hyponatremia - hypovolemic and responded to IVF's. Acute metabolic encephalopathy - Palliative care consulted.  Irena Cords, MD [employer] [phone]

## 2021-07-12 NOTE — Consult Note (Signed)
Consultation Note Date: 07/12/2021   Patient Name: Debbie Hampton  DOB: 1931/09/28  MRN: [id number]  Age / Sex: 87 y.o., female  PCP: Kandyce Rud, MD Referring Physician: Narda Bonds, MD  Reason for Consultation: Establishing goals of care  HPI/Patient Profile: 87 y.o. female  admitted on 07/07/2021 with  .   Clinical Assessment and Goals of Care: 87 year old lady who came from [facility] facility, history of chronic atrial fibrillation, carotid artery disease, coronary artery disease GERD hiatal hernia hypertension dyslipidemia stroke depression diabetes.  Patient was sent because of rectal bleeding nausea vomiting and abdominal pain.  Initially was worked up for possible cholecystitis, progressed to acute abdomen and was seen emergently by surgery.  Patient underwent diagnostic laparoscopy which was converted to exploratory laparotomy.  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 Hospital course also complicated by postop ileus, oliguric acute kidney injury and ongoing acute metabolic encephalopathy. Palliative consultation for goals of care discussions has been requested. Patient seen and examined.  Noted to be mumbling some words.  Discussed with bedside nursing.  On clear liquids.  Call placed and discussed with son Mr. Sharp.  He states that the patient actually took some clear liquids and at that her mental status has been somewhat more interactive especially in the last 24 hours than before.  I introduced myself and palliative care as follows: Palliative medicine is specialized medical care for people living with serious illness. It focuses on providing relief from the symptoms and stress of a serious illness. The goal is to improve quality of life for both the patient and the family. Goals of care: Broad aims of  medical therapy in relation to the patient's values and preferences. Our aim is to provide medical care aimed at enabling patients to achieve the goals that matter most to them, given the circumstances of their particular medical situation and their constraints.   NEXT OF KIN Son [name]  SUMMARY OF RECOMMENDATIONS   Agree with DNR Continue current mode of care. Call placed and discussed with son regarding broad goals of care.  We reviewed previously prepared advance directives that the patient has been made.  Patient's son describes her as a woman of faith and that that she has always opposed artificial mechanical measures such as dialysis feeding tubes and ventilators.  He hopes that she will be able to rally and improve as far as her mental status and her oral intake in this hospitalization.  If she does, plan would be to consider either another rehab attempt or skilled care.  If the patient has ongoing decline then the plan would be for residential hospice. Patient's son states that the patient is very familiar with hospice philosophy of care.  In particular, they are not familiar with the type of care that is provided inside a residential hospice setting such as [facility].  Patient's son states that the patient already spoke highly of hospice as a philosophy of care as well as  the job that hospice providers do.  If the patient is not improving, he does not want her to suffer and at that time would like to focus on keeping her as comfortable as possible inside a residential hospice setting.  Hence, plan is now to monitor hospital course and overall disease trajectory of illness as a time-limited trial for at least the next 24-48 hours to best to determine next steps and most appropriate disposition options.  Palliative to follow. Thank you for the consult. Code Status/Advance Care Planning: DNR   Symptom Management:     Palliative Prophylaxis:  Delirium Protocol      Psycho-social/Spiritual:  Desire for further Chaplaincy support:yes Additional Recommendations: Caregiving  Support/Resources  Prognosis:  Unable to determine  Discharge Planning: To Be Determined      Primary Diagnoses: Present on Admission:  Rectal bleeding  Atrial fibrillation (HCC)  Compression fracture of T11 vertebra (HCC)  Hypertension  Hyponatremia  Hypothyroidism  Type 2 diabetes mellitus with hyperglycemia (HCC)  Dilated gallbladder  Acute lower UTI  Elevated lipase  Renal artery stenosis (HCC)   I have reviewed the medical record, interviewed the patient and family, and examined the patient. The following aspects are pertinent.  Past Medical History:  Diagnosis Date   Aortic atherosclerosis (HCC)    Arthritis    Atrial fibrillation (HCC)    a.) CHA2DS2-VASc = 7; on low dose apixaban. b.) TTE 07/30/2017 --> EF 60-65%; mild MR and TR.   Bilateral shoulder pain    Carotid artery disease (HCC)    a.)  BILATERAL carotid Doppler on 07/30/2017 --> mild to moderate plaque in BILATERAL ICAs compatible with 50-69% stenosis (R >L).   Chronic anticoagulation    Apixaban   Colon polyp    Constipation    Coronary artery calcification    a.) CT chest on 05/07/2021 --> 3 vessel CAD.   Depression    DOE (dyspnea on exertion)    Falls    GERD (gastroesophageal reflux disease)    Hiatal hernia    a.) CT chest on 05/07/2021 --> large; distended with fluid.   History of hiatal hernia    HLD (hyperlipidemia)    Hypertension    Impaired fasting glucose    Multiple thyroid nodules    Bilateral   Stroke (HCC) 07/29/2017   a.) Acute 1.5 cm infarction within the periventricular deep white matter on the RIGHT adjacent to the posterior body of the lateral ventricle.   Syncope 03/2016   with [facility]   Valvular regurgitation    a.) TTE 08/03/2017 --> mild MR and TR.   Varicose veins of both lower extremities    Social History   Socioeconomic History   Marital status:  Married    Spouse name: Not on file   Number of children: Not on file   Years of education: Not on file   Highest education level: Not on file  Occupational History   Not on file  Tobacco Use   Smoking status: Former    Types: Cigarettes    Quit date: 02/16/1984    Years since quitting: 37.4   Smokeless tobacco: Never  Substance and Sexual Activity   Alcohol use: No   Drug use: No   Sexual activity: Not on file  Other Topics Concern   Not on file  Social History Narrative   Currently resides at [facility]   Social Determinants of Health   Financial Resource Strain: Not on file  Food Insecurity: Not  on file  Transportation Needs: Not on file  Physical Activity: Not on file  Stress: Not on file  Social Connections: Not on file   Family History  Problem Relation Age of Onset   Hypertension Other    Breast cancer Sister    Kidney cancer Neg Hx    Kidney disease Neg Hx    Prostate cancer Neg Hx    Scheduled Meds:  Chlorhexidine Gluconate Cloth  6 each Topical Daily   diclofenac Sodium  2 g Topical QID   mouth rinse  15 mL Mouth Rinse BID   metoprolol tartrate  5 mg Intravenous Q6H   pantoprazole (PROTONIX) IV  40 mg Intravenous Daily   Continuous Infusions:  sodium chloride Stopped (07/09/21 1613)   cefTRIAXone (ROCEPHIN)  IV Stopped (07/11/21 1736)   heparin 850 Units/hr (07/12/21 1501)   metronidazole 100 mL/hr at 07/12/21 1501   PRN Meds:.sodium chloride, acetaminophen **OR** acetaminophen, antiseptic oral rinse, morphine injection, ondansetron **OR** ondansetron (ZOFRAN) IV, phenol Medications Prior to Admission:  Prior to Admission medications   Medication Sig Start Date End Date Taking? Authorizing Provider  atenolol (TENORMIN) 50 MG tablet Take 1 tablet (50 mg total) by mouth 2 (two) times daily. 05/09/21  Yes Enedina Finner, MD  calcium carbonate (TUMS EX) 750 MG chewable tablet Chew 2 tablets by mouth 3 (three) times daily as needed for  heartburn.   Yes [provider]  cholecalciferol (VITAMIN D3) 25 MCG (1000 UNIT) tablet Take 2,000 Units by mouth daily.   Yes [provider]  citalopram (CELEXA) 20 MG tablet Take 20 mg by mouth daily.    Yes [provider]  diclofenac Sodium (VOLTAREN) 1 % GEL Apply 2 g topically 4 (four) times daily. 04/30/21  Yes [provider]  Esomeprazole Magnesium 20 MG TBEC Take 20 mg by mouth daily at 12 noon.   Yes [provider]  Fluocinolone Acetonide 0.01 % OIL Place 2 drops into both ears once a week. Wednesdays 05/02/21  Yes [provider]  fluticasone (FLONASE) 50 MCG/ACT nasal spray Place 1 spray into both nostrils daily as needed for allergies or rhinitis.   Yes [provider]  furosemide (LASIX) 20 MG tablet Take 1 tablet (20 mg total) by mouth daily as needed. For SOB or leg edema 05/09/21  Yes Enedina Finner, MD  guaiFENesin (MUCINEX) 600 MG 12 hr tablet Take 600 mg by mouth 2 (two) times daily as needed (congestion).   Yes [provider]  HYDROcodone-acetaminophen (NORCO/VICODIN) 5-325 MG tablet Take 1-2 tablets by mouth every 4 (four) hours as needed for moderate pain (pain score 4-6). 06/06/21  Yes Evon Slack, PA-C  iron polysaccharides (NIFEREX) 150 MG capsule Take 150 mg by mouth daily.   Yes [provider]  lidocaine (LIDODERM) 5 % Place 1 patch onto the skin daily. Remove & Discard patch within 12 hours or as directed by MD 05/10/21  Yes Enedina Finner, MD  lisinopril (ZESTRIL) 10 MG tablet Take 10 mg by mouth daily. 11/21/20  Yes [provider]  liver oil-zinc oxide (DESITIN) 40 % ointment Apply 1 application topically in the morning, at noon, and at bedtime. Apply to buttocks   Yes [provider]  Multiple Vitamins-Minerals (CERTAVITE/ANTIOXIDANTS) TABS Take 1 tablet by mouth daily.   Yes [provider]  omega-3 acid ethyl esters (LOVAZA) 1 g capsule Take 1 g by mouth 2 (two)  times daily.   Yes [provider]  polyethylene glycol (MIRALAX /  GLYCOLAX) packet Take 17 g by mouth daily. 08/14/17  Yes Love, Evlyn Kanner, PA-C  rosuvastatin (CRESTOR) 20 MG tablet Take 20 mg by mouth at bedtime.   Yes [provider]  sodium chloride (OCEAN) 0.65 % SOLN nasal spray Place 1 spray into both nostrils 3 (three) times daily as needed for congestion.    Yes [provider]  tiZANidine (ZANAFLEX) 2 MG tablet Take 2 mg by mouth as needed (twice daily as needed). 04/26/21  Yes [provider]   Allergies  Allergen Reactions   Cheese     GOAT cheese   Mushroom Extract Complex    Penicillins     Has patient had a PCN reaction causing immediate rash, facial/tongue/throat swelling, SOB or lightheadedness with hypotension: Yes Has patient had a PCN reaction causing severe rash involving mucus membranes or skin necrosis: No Has patient had a PCN reaction that required hospitalization No Has patient had a PCN reaction occurring within the last 10 years: Yes If all of the above answers are "NO", then may proceed with Cephalosporin use.    Verapamil    Aloe Hives and Rash   Amlodipine Hives, Rash and Hypertension   Cephalexin Rash    Tolerated Ancef 09 of 2022   Metoprolol Hives, Palpitations and Hypertension    Succinate   Miconazole Swelling and Rash   Neosporin [Neomycin-Bacitracin Zn-Polymyx] Rash   Trandolapril-Verapamil Hcl Er Rash   Review of Systems Patient mumbles some but does not verbalize much Physical Exam Awakens when her name is called Appears chronically ill Attempts to mumble some words Appears with generalized weakness Regular breath sounds S1-S2 Does not have edema  Vital Signs: BP (!) 172/115   Pulse (!) 110   Temp 98.4 F (36.9 C) (Axillary)   Resp 15   Ht 5\' 2"  (1.575 m)   Wt 62.5 kg   SpO2 98%   BMI 25.20 kg/m  Pain Scale: CPOT   Pain Score: Asleep   SpO2: SpO2: 98 % O2 Device:SpO2: 98 % O2 Flow Rate:  .O2 Flow Rate (L/min): 2 L/min  IO: Intake/output summary:  Intake/Output Summary (Last 24 hours) at 07/12/2021 1553 Last data filed at 07/12/2021 1501 Gross per 24 hour  Intake 2160.83 ml  Output 1295 ml  Net 865.83 ml    LBM: Last BM Date: 07/12/21 Baseline Weight: Weight: 59.9 kg Most recent weight: Weight: 62.5 kg     Palliative Assessment/Data:   PPS 30%  Time In:  1400 Time Out:  1500 Time Total:  60  Greater than 50%  of this time was spent counseling and coordinating care related to the above assessment and plan.  Signed by: 07/14/21, MD   Please contact Palliative Medicine Team phone at [phone] for questions and concerns.  For individual provider: See [phone]

## 2021-07-13 ENCOUNTER — Inpatient Hospital Stay (HOSPITAL_COMMUNITY): Payer: Medicare Other

## 2021-07-13 DIAGNOSIS — K625 Hemorrhage of anus and rectum: Secondary | ICD-10-CM | POA: Diagnosis not present

## 2021-07-13 DIAGNOSIS — I4891 Unspecified atrial fibrillation: Secondary | ICD-10-CM | POA: Diagnosis not present

## 2021-07-13 DIAGNOSIS — L8990 Pressure ulcer of unspecified site, unspecified stage: Secondary | ICD-10-CM | POA: Insufficient documentation

## 2021-07-13 DIAGNOSIS — I700 Atherosclerosis of aorta: Secondary | ICD-10-CM | POA: Diagnosis not present

## 2021-07-13 DIAGNOSIS — N390 Urinary tract infection, site not specified: Secondary | ICD-10-CM | POA: Diagnosis not present

## 2021-07-13 LAB — BASIC METABOLIC PANEL
Anion gap: 6 (ref 5–15)
BUN: 48 mg/dL — ABNORMAL HIGH (ref 8–23)
CO2: 24 mmol/L (ref 22–32)
Calcium: 8.8 mg/dL — ABNORMAL LOW (ref 8.9–10.3)
Chloride: 108 mmol/L (ref 98–111)
Creatinine, Ser: 0.91 mg/dL (ref 0.44–1.00)
GFR, Estimated: >60 mL/min (ref >60)
Glucose, Bld: 99 mg/dL (ref 70–99)
Potassium: 4 mmol/L (ref 3.5–5.1)
Sodium: 138 mmol/L (ref 135–145)

## 2021-07-13 LAB — CBC
HCT: 28.8 % — ABNORMAL LOW (ref 36.0–46.0)
Hemoglobin: 8.8 g/dL — ABNORMAL LOW (ref 12.0–15.0)
MCH: 29.8 pg (ref 26.0–34.0)
MCHC: 30.6 g/dL (ref 30.0–36.0)
MCV: 97.6 fL (ref 80.0–100.0)
Platelets: 163 10*3/uL (ref 150–400)
RBC: 2.95 MIL/uL — ABNORMAL LOW (ref 3.87–5.11)
RDW: 14.2 % (ref 11.5–15.5)
WBC: 9.7 10*3/uL (ref 4.0–10.5)
nRBC: 0.2 % (ref 0.0–0.2)

## 2021-07-13 LAB — GLUCOSE, CAPILLARY
Glucose-Capillary: 109 mg/dL — ABNORMAL HIGH (ref 70–99)
Glucose-Capillary: 123 mg/dL — ABNORMAL HIGH (ref 70–99)
Glucose-Capillary: 133 mg/dL — ABNORMAL HIGH (ref 70–99)
Glucose-Capillary: 86 mg/dL (ref 70–99)

## 2021-07-13 LAB — HEPARIN LEVEL (UNFRACTIONATED): Heparin Unfractionated: 0.59 IU/mL (ref 0.30–0.70)

## 2021-07-13 LAB — BRAIN NATRIURETIC PEPTIDE: B Natriuretic Peptide: 849 pg/mL — ABNORMAL HIGH (ref 0.0–100.0)

## 2021-07-13 MED ORDER — ATENOLOL 25 MG PO TABS
50.0000 mg | ORAL_TABLET | Freq: Two times a day (BID) | ORAL | Status: DC
  Administered 2021-07-13: 50 mg via ORAL
  Filled 2021-07-13: qty 2

## 2021-07-13 MED ORDER — ENSURE ENLIVE PO LIQD
237.0000 mL | Freq: Two times a day (BID) | ORAL | Status: DC
  Administered 2021-07-13 – 2021-07-15 (×3): 237 mL via ORAL

## 2021-07-13 MED ORDER — METOPROLOL TARTRATE 5 MG/5ML IV SOLN
5.0000 mg | Freq: Four times a day (QID) | INTRAVENOUS | Status: DC
  Administered 2021-07-13 – 2021-07-14 (×2): 5 mg via INTRAVENOUS
  Filled 2021-07-13 (×2): qty 5

## 2021-07-13 MED ORDER — DIPHENOXYLATE-ATROPINE 2.5-0.025 MG/5ML PO LIQD
5.0000 mL | Freq: Four times a day (QID) | ORAL | Status: DC
  Administered 2021-07-13 – 2021-07-14 (×5): 5 mL via ORAL
  Filled 2021-07-13 (×5): qty 5

## 2021-07-13 MED ORDER — BOOST / RESOURCE BREEZE PO LIQD CUSTOM: 1.0000 | Freq: Three times a day (TID) | ORAL | Status: DC

## 2021-07-13 MED ORDER — METOPROLOL TARTRATE 5 MG/5ML IV SOLN
5.0000 mg | Freq: Four times a day (QID) | INTRAVENOUS | Status: DC | PRN
  Administered 2021-07-13: 5 mg via INTRAVENOUS
  Filled 2021-07-13: qty 5

## 2021-07-13 MED ORDER — ATENOLOL 25 MG PO TABS: 50.0000 mg | ORAL_TABLET | Freq: Two times a day (BID) | ORAL | Status: DC

## 2021-07-13 NOTE — Progress Notes (Signed)
Progress Note  4 Days Post-Op  Subjective: Mental status significantly improved this am. Oriented to self and place though confused about current medical situation. She denies nausea or pain currently   BM yesterday  Objective: Vital signs in last 24 hours: Temp:  [96.4 F (35.8 C)-98.7 F (37.1 C)] 96.4 F (35.8 C) (10/28 0400) Pulse Rate:  [44-116] 107 (10/28 0600) Resp:  [10-28] 14 (10/28 0600) BP: (93-183)/(46-136) 153/89 (10/28 0600) SpO2:  [93 %-100 %] 100 % (10/28 0600) Last BM Date: 07/12/21  Intake/Output from previous day: 10/27 0701 - 10/28 0700 In: 2048 [P.O.:100; I.V.:1649.2; IV Piggyback:298.9] Out: 1100 [Urine:1100] Intake/Output this shift: No intake/output data recorded.  PE: General: pleasant, WD, female who is laying in bed in NAD HEENT: head is normocephalic, atraumatic.  Mouth is pink and moist Heart: regular, rate, and rhythm.  Palpable radial pulses bilaterally Lungs: CTAB, no wheezes, rhonchi, or rales noted.  Respiratory effort nonlabored on supplemental O2 via [place] Abd: soft, ND, +BS, very mild TTP greatest over incision. Midline incision with staples intact without purulent drainage MSK: all 4 extremities are symmetrical GU: foley catheter with clear yellow urine Skin: warm and dry    Lab Results:  Recent Labs    07/12/21 0318 07/13/21 0251  WBC 10.1 9.7  HGB 8.7* 8.8*  HCT 28.5* 28.8*  PLT 152 163    BMET Recent Labs    07/12/21 0733 07/13/21 0251  NA 139 138  K 4.2 4.0  CL 108 108  CO2 25 24  GLUCOSE 117* 99  BUN 61* 48*  CREATININE 1.23* 0.91  CALCIUM 9.1 8.8*    PT/INR No results for input(s): LABPROT, INR in the last 72 hours. CMP     Component Value Date/Time   NA 138 07/13/2021 0251   NA 137 05/04/2014 1850   K 4.0 07/13/2021 0251   K 3.5 05/04/2014 1850   CL 108 07/13/2021 0251   CL 99 05/04/2014 1850   CO2 24 07/13/2021 0251   CO2 27 05/04/2014 1850   GLUCOSE 99 07/13/2021 0251   GLUCOSE 118 (H)  05/04/2014 1850   BUN 48 (H) 07/13/2021 0251   BUN 17 05/04/2014 1850   CREATININE 0.91 07/13/2021 0251   CREATININE 0.86 05/04/2014 1850   CALCIUM 8.8 (L) 07/13/2021 0251   CALCIUM 8.2 (L) 05/04/2014 1850   PROT 5.6 (L) 07/09/2021 0456   PROT 5.9 (L) 05/04/2014 1850   ALBUMIN 2.9 (L) 07/09/2021 0456   ALBUMIN 3.2 (L) 05/04/2014 1850   AST 30 07/09/2021 0456   AST 24 05/04/2014 1850   ALT 15 07/09/2021 0456   ALT 28 05/04/2014 1850   ALKPHOS 61 07/09/2021 0456   ALKPHOS 97 05/04/2014 1850   BILITOT 0.5 07/09/2021 0456   BILITOT 0.2 05/04/2014 1850   GFRNONAA >60 07/13/2021 0251   GFRNONAA >60 05/04/2014 1850   GFRAA >60 11/28/2019 0805   GFRAA >60 05/04/2014 1850   Lipase     Component Value Date/Time   LIPASE 26 07/09/2021 0456       Studies/Results: No results found.  Anti-infectives: Anti-infectives (From admission, onward)    Start     Dose/Rate Route Frequency Ordered Stop   07/08/21 1800  cefTRIAXone (ROCEPHIN) 2 g in sodium chloride 0.9 % 100 mL IVPB        2 g 200 mL/hr over 30 Minutes Intravenous Every 24 hours 07/08/21 1407     07/08/21 1500  metroNIDAZOLE (FLAGYL) IVPB 500 mg  500 mg 100 mL/hr over 60 Minutes Intravenous Every 12 hours 07/08/21 1407     07/07/21 0800  ciprofloxacin (CIPRO) IVPB 400 mg  Status:  Discontinued        400 mg 200 mL/hr over 60 Minutes Intravenous 2 times daily 07/07/21 0733 07/08/21 1407   07/07/21 0730  ciprofloxacin (CIPRO) IVPB 400 mg  Status:  Discontinued        400 mg 200 mL/hr over 60 Minutes Intravenous  Once 07/07/21 0727 07/07/21 0733   07/07/21 0730  metroNIDAZOLE (FLAGYL) IVPB 500 mg        500 mg 100 mL/hr over 60 Minutes Intravenous  Once 07/07/21 0727 07/07/21 1104        Assessment/Plan Ms. Hammill is an 87 year old female who presented with atrial fibrillation on eliquis who presented with abdominal pain concerning at first for cholecystitis.  Workup was negative but her pain continued and she  developed peritonitic signs on exam so she was taken for: dx lap converted to ex lap with R colectomy and ileum resection on 07/09/21 by Dr. Dossie Der, for mesenteric ischemia consistent with an embolic pattern.   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 POD#4 - 90 cm of small bowel left after the ligament of treitz, anastomosis to proximal transverse colon - AKI/fluid balance - Cr 0.91 today - hgb 8.8 (8.7) on heparin gtt. Continue to monitor - BM yesterday - mental status allowing okay to continue to advance diet to fulls/ensure. Monitor Bms - due to degree of resection will have increased GI fluid losses and consider antimotility agents as bowel function increases - palliative following. Family/patient do not want cortrak/TPN which is reasonable   FEN: CLD ADAT FLD, ensure, LR @ 31mL/hr ID: ceftriaxone/flagyl VTE: heparin gtt - okay to resume oral anticoagulation from surgical perspective   Remainder of care per primary team   LOS: 6 days    Eric Form, [facility] 07/13/2021, 7:17 AM Please see Amion for pager number during day hours 7:00am-4:30pm

## 2021-07-13 NOTE — Progress Notes (Signed)
Chaplain offered to visit patient, but was stopped by  her son [name] at the door, who explained to chaplain that it was not a good time for a visit. He said "a lot was thrown at her at once" and today "she's in a lot of discomfort."  He apologized that he didn't think it was a good idea, but chaplain reassured him that she understood.  He said he tried to get the pastor to see her yesterday and it was not as good an idea as he hoped. The pastor came and had to leave. Chaplain said she would offer silent prayer for her.  Rev. Lynnell Chad Pager [phone]

## 2021-07-13 NOTE — Progress Notes (Signed)
ANTICOAGULATION CONSULT NOTE - Follow Up Consult  Pharmacy Consult for Heparin Indication: atrial fibrillation  Allergies  Allergen Reactions   Cheese     GOAT cheese   Mushroom Extract Complex    Penicillins     Has patient had a PCN reaction causing immediate rash, facial/tongue/throat swelling, SOB or lightheadedness with hypotension: Yes Has patient had a PCN reaction causing severe rash involving mucus membranes or skin necrosis: No Has patient had a PCN reaction that required hospitalization No Has patient had a PCN reaction occurring within the last 10 years: Yes If all of the above answers are "NO", then may proceed with Cephalosporin use.    Verapamil    Aloe Hives and Rash   Amlodipine Hives, Rash and Hypertension   Cephalexin Rash    Tolerated Ancef 09 of 2022   Metoprolol Hives, Palpitations and Hypertension    Succinate   Miconazole Swelling and Rash   Neosporin [Neomycin-Bacitracin Zn-Polymyx] Rash   Trandolapril-Verapamil Hcl Er Rash    Patient Measurements: Height: 5\' 2"  (157.5 cm) Weight: 62.5 kg (137 lb 12.6 oz) IBW/kg (Calculated) : 50.1 Heparin Dosing Weight: TBW  Vital Signs: Temp: 99 F (37.2 C) (10/28 0841) Temp Source: Axillary (10/28 0841) BP: 179/111 (10/28 0741) Pulse Rate: 112 (10/28 0741)  Labs: Recent Labs    07/11/21 0515 07/12/21 0318 07/12/21 0733 07/13/21 0251  HGB 9.3* 8.7*  --  8.8*  HCT 31.0* 28.5*  --  28.8*  PLT 153 152  --  163  HEPARINUNFRC 0.44 0.48  --  0.59  CREATININE 1.64*  --  1.23* 0.91     Estimated Creatinine Clearance: 38.6 mL/min (by C-G formula based on SCr of 0.91 mg/dL).   Medications:  Infusions:   sodium chloride Stopped (07/13/21 0530)   cefTRIAXone (ROCEPHIN)  IV Stopped (07/12/21 1738)   heparin 850 Units/hr (07/13/21 0400)   lactated ringers 75 mL/hr at 07/13/21 0400   metronidazole Stopped (07/13/21 0319)    Assessment: 27 yoF with PMH of afib admitted for rectal bleeding and abdominal  pain.  She was previously on apixaban, but held in September for rectal bleeding. Pharmacy is consulted to dose Heparin IV on 10/25 postop.   S/p 10/24 diagnostic laparoscopy converted to exploratory laparotomy with right colectomy and ileal resection for mesenteric ischemia consistent with embolic pattern.  Today, 07/13/2021: Heparin level 0.59, remains therapeutic on IV heparin at 850 units/hr CBC:  Hgb low 8.8 (unchanged from yesterday), Plt WNL AKI improving (baseline SCr 0.6-0.8) No bleeding or complications reported today  Goal of Therapy:  Heparin level 0.3-0.7 units/ml Monitor platelets by anticoagulation protocol: Yes   Plan:  Continue heparin IV infusion at 850 units/hr Daily heparin level and CBC while on heparin Continue to monitor H&H and platelets postop F/U long term anticoagulation plan   07/15/2021, PharmD, BCPS [facility] main pharmacy [phone] 07/13/2021 9:03 AM

## 2021-07-13 NOTE — Progress Notes (Signed)
Occupational Therapy Treatment Patient Details Name: Debbie Hampton MRN: [id number] DOB: [birth year] Today's Date: 07/13/2021   History of present illness 87 year old female, SNF resident, extremely hard of hearing, medical history significant for chronic atrial fibrillation on Eliquis, bilateral carotid artery disease, CAD, GERD, hiatal hernia, HTN, HLD, nonhemorrhagic stroke, depression, type II DM, Kyphoplasty 06/05/21, sent from SNF due to rectal bleeding, nausea, vomiting and abdominal pain.   Progressed to acute abdomen with peritonitis.  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   OT comments  Pt performed grooming tasks at bed level this session due to A fib and RN recommending only bed level activities. Pt required Mod A to perform oral care with mod multimodal cues. Pt did not respond to any orientation questions, appeared anxious, moaning, and reporting pain in abdomen and R arm throughout. Pt repositioned and RUE elevated to reduce edema in distal extremity, pt appeared to relax with no c/o pain after repositioning. Continuing to recommend SNF rehab post d/c, will continue to follow in the acute setting.      Recommendations for follow up therapy are one component of a multi-disciplinary discharge planning process, led by the attending physician.  Recommendations may be updated based on patient status, additional functional criteria and insurance authorization.    Follow Up Recommendations  Skilled nursing-short term rehab (<3 hours/day)    Assistance Recommended at Discharge Frequent or constant Supervision/Assistance  Equipment Recommendations  Other (comment) (defer to next venue)    Recommendations for Other Services      Precautions / Restrictions Precautions Precautions: Fall Precaution Comments: ABD incision, recent T11  fx Restrictions Weight Bearing Restrictions: No       Mobility Bed Mobility                    Transfers                         Balance                                           ADL either performed or assessed with clinical judgement   ADL Overall ADL's : Needs assistance/impaired     Grooming: Wash/dry face;Bed level;Moderate assistance Grooming Details (indicate cue type and reason): Required min multimodal cues to wash face, no physical assist. Required mod multimodal cues to perform oral care and physical assist to squeeze toothpaste onto toothbrush.                                     Vision       Perception     Praxis      Cognition Arousal/Alertness: Awake/alert Behavior During Therapy: Restless;Anxious Overall Cognitive Status: Impaired/Different from baseline Area of Impairment: Orientation;Attention;Following commands;Safety/judgement;Awareness                 Orientation Level: Place;Situation Current Attention Level: Focused   Following Commands: Follows one step commands with increased time;Follows one step commands inconsistently Safety/Judgement: Decreased awareness of deficits Awareness: Intellectual   General Comments: pt anxious, asking where son was. Moaning about pain in Abdomen and L arm throughout session. Could not answer orientation questions when asked.  Exercises     Shoulder Instructions       General Comments      Pertinent Vitals/ Pain       Pain Assessment: Faces Faces Pain Scale: Hurts even more Breathing: normal Negative Vocalization: repeated troubled calling out, loud moaning/groaning, crying Facial Expression: facial grimacing Body Language: tense, distressed pacing, fidgeting Consolability: no need to console PAINAD Score: 5 Facial Expression: Relaxed, neutral Body Movements: Absence of movements Muscle Tension: Relaxed Compliance with  ventilator (intubated pts.): N/A Vocalization (extubated pts.): N/A CPOT Total: 0 Pain Location: abdomen op site and in L arm Pain Descriptors / Indicators: Tingling;Discomfort;Grimacing;Moaning Pain Intervention(s): Limited activity within patient's tolerance;Monitored during session;Repositioned  Home Living                                          Prior Functioning/Environment              Frequency  Min 2X/week        Progress Toward Goals  OT Goals(current goals can now be found in the care plan section)  Progress towards OT goals: OT to reassess next treatment  ADL Goals Pt Will Perform Grooming: with modified independence;sitting Pt Will Transfer to Toilet: with supervision;bedside commode;stand pivot transfer Pt Will Perform Toileting - Clothing Manipulation and hygiene: sitting/lateral leans;with min guard assist  Plan Discharge plan remains appropriate    Co-evaluation                 AM-PAC OT "6 Clicks" Daily Activity     Outcome Measure   Help from another person eating meals?: A Little Help from another person taking care of personal grooming?: A Lot Help from another person toileting, which includes using toliet, bedpan, or urinal?: A Lot Help from another person bathing (including washing, rinsing, drying)?: A Lot Help from another person to put on and taking off regular upper body clothing?: A Lot Help from another person to put on and taking off regular lower body clothing?: Total 6 Click Score: 12    End of Session    OT Visit Diagnosis: Unsteadiness on feet (R26.81);Muscle weakness (generalized) (M62.81)   Activity Tolerance Patient tolerated treatment well   Patient Left in bed;with call bell/phone within reach;with bed alarm set   Nurse Communication Other (comment) (nurse cleared therapy session in bed activities only)        Time: 1300-1326 OT Time Calculation (min): 26 min  Charges: OT General  Charges $OT Visit: 1 Visit OT Treatments $Self Care/Home Management : 23-37 mins  Prudence Davidson, OTS Acute Rehab Office: [phone]   Jaqwon Manfred 07/13/2021, 2:11 PM

## 2021-07-13 NOTE — Progress Notes (Signed)
PROGRESS NOTE    Debbie Hampton  YQM:[id number] DOB: Feb 21, 1932 DOA: 07/07/2021 PCP: Kandyce Rud, MD   Brief Narrative: Debbie Hampton is a 87 y.o. female, SNF resident, extremely hard of hearing, medical history significant for chronic atrial fibrillation on Eliquis, bilateral carotid artery disease, CAD, GERD, hiatal hernia, HTN, HLD, nonhemorrhagic stroke, depression, type II DM, sent from SNF due to rectal bleeding, nausea, vomiting and abdominal pain.  Admitted for possible low volume rectal bleed likely related to hemorrhoids, main issue however was concern for acute acalculous cholecystitis.  [facility] and general surgery were consulted.  Eventually HIDA scan negative.  Progressed to acute abdomen with peritonitis.  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   Assessment & Plan:   Principal Problem:   Rectal bleeding Active Problems:   Hyponatremia   Atrial fibrillation (HCC)   Hypertension   Compression fracture of T11 vertebra (HCC)   Hypothyroidism   Type 2 diabetes mellitus with hyperglycemia (HCC)   Dilated gallbladder   Acute lower UTI   Elevated lipase   Renal artery stenosis (HCC)   HLD (hyperlipidemia)   Aortic atherosclerosis (HCC)   Pressure injury of skin   Acute peritonitis Embolic mesenteric ischemia Patient presented with concern for acute cholecystitis. HIDA scan negative. Patient subsequently worsened with evident of acute abdomen with peritonitis. Patient underwent diagnostic laparoscopy converted to exploratory laparotomy with right colectomy/ileal resection for embolic mesenteric ischemia with primary anastomosis to proximal transverse colon. -General surgery recommendations: Clear liquid diet, pending today  Post-op ileus Persistent. No bowel movement yet. NG tube removed by patient. -Surgery recommendations:  clears -Continue IV fluids  Sepsis Not present on admission. Secondary to mesenteric ischemia. IV fluids and antibiotics started. No blood cultures available. Physiology resolved.  Rectal bleeding Likely related to hemorrhoidal bleeding. Resolved.   E. Coli acute cystitis Confirmed on urine culture. Patient started on Ciprofloxacin IV which was transitioned to Ceftriaxone IV. -Continue Ceftriaxone IV  Hyponatremia Mild. Likely related to dehydration. Resolved with IV fluids.  Dehydration Likely secondary to GI losses. Started on IV fluids.  Oliguric AKI Baseline creatinine of about 0.8-0.9. Creatinine of 1.08 on admission with peak of 1.64. Possibly related to ATN. Nephrology consulted with recommendations for increased IV fluids. Creatinine stabilized. UOP of 1100 mL over the last 24 hours. Resolved.  Acute metabolic encephalopathy Unknown baseline but concern this may be related to dehydration vs delirium. Mental status appears to be improved this morning. -Management of above issues and watch for improvement -Delirium precautions  Chronic atrial fibrillation Atenolol held secondary to NPO status. Started on metoprolol IV. Heparin IV restarted in place of low-dose Eliquis -Continue Heparin IV -Restart atenolol 50 mg BID, transition to metoprolol IV prn  Acute anemia Thought to be secondary to acute illness/post-op blood loss and hemodilution. Does not appear to have acute bleeding. CT angio obtained on 10/22 without source for bleed.  Leukocytosis Secondary to mesenteric ischemia. Peak WBC of 21,600 and is now resolved.   Primary hypertension History of left renal artery stenosis on atenolol and lisinopril. Started on metoprolol while NPO.  History of T11 compression fracture Stable.  Hypothyroidism No outpatient medications noted. Unsure of diagnosis. Cannot confirm at this time. TSH normal.  Diabetes mellitus, type 2 Controlled. Hemoglobin A1C of  5.6%.  Hyperlipidemia Aortic atherosclerosis Patient is on Crestor as an outpatient.  Hearing loss Noted.  Moderate sliding hiatal hernia GERD Noted.  Bilateral pleural effusions Noted on CT scan. Moderate R>L. Layering.  Goals of care Per general surgery discussion with family, Palliative Care consult placed. With regard to nutrition, desire to not place feeding tube or start TPN.   DVT prophylaxis: Heparin IV Code Status:   Code Status: DNR Family Communication: Son on telephone Disposition Plan: Discharge pending improvement of mental status, advancement of diet, improvement of AKI, specialist recommendations for discharge.   Consultants:  General surgery [facility]  Procedures:  RIGHT COLECTOMY WITH RESECTION OF THE ILEUM AND PART OF THE JEJUNUM (07/11/2021)  Antimicrobials: Ceftriaxone Ciprofloxacin Flagyl    Subjective: No concerns today. Patient states no abdominal pain  Objective: Vitals:   07/13/21 0300 07/13/21 0400 07/13/21 0500 07/13/21 0600  BP: 122/72 131/70 (!) 145/98 (!) 153/89  Pulse: (!) 102 86 96 (!) 107  Resp: 11 10 14 14   Temp:  (!) 96.4 F (35.8 C)    TempSrc:  Axillary    SpO2: 100% 100% 98% 100%  Weight:      Height:        Intake/Output Summary (Last 24 hours) at 07/13/2021 0734 Last data filed at 07/13/2021 0530 Gross per 24 hour  Intake 2048.01 ml  Output 1100 ml  Net 948.01 ml    Filed Weights   07/07/21 0143 07/12/21 0326  Weight: 59.9 kg 62.5 kg    Examination:  General exam: Appears calm and comfortable Respiratory system: Clear to auscultation. Respiratory effort normal. Cardiovascular system: S1 & S2 heard, irregular rhythm with normal rate. No murmurs, rubs, gallops or clicks. Gastrointestinal system: Abdomen is nondistended, soft and tender. No organomegaly or masses felt. Normal bowel sounds heard. Central nervous system: Alert and oriented to self and location. No focal neurological  deficits. Musculoskeletal: No edema. No calf tenderness Skin: No cyanosis. No rashes Psychiatry: Judgement and insight appear normal. Mood & affect appropriate.    Data Reviewed: I have personally reviewed following labs and imaging studies  CBC Lab Results  Component Value Date   WBC 9.7 07/13/2021   RBC 2.95 (L) 07/13/2021   HGB 8.8 (L) 07/13/2021   HCT 28.8 (L) 07/13/2021   MCV 97.6 07/13/2021   MCH 29.8 07/13/2021   PLT 163 07/13/2021   MCHC 30.6 07/13/2021   RDW 14.2 07/13/2021   LYMPHSABS 0.4 (L) 07/09/2021   MONOABS 1.9 (H) 07/09/2021   EOSABS 0.0 07/09/2021   BASOSABS 0.1 07/09/2021     Last metabolic panel Lab Results  Component Value Date   NA 138 07/13/2021   K 4.0 07/13/2021   CL 108 07/13/2021   CO2 24 07/13/2021   BUN 48 (H) 07/13/2021   CREATININE 0.91 07/13/2021   GLUCOSE 99 07/13/2021   GFRNONAA >60 07/13/2021   GFRAA >60 11/28/2019   CALCIUM 8.8 (L) 07/13/2021   PHOS 3.7 08/22/2018   PROT 5.6 (L) 07/09/2021   ALBUMIN 2.9 (L) 07/09/2021   BILITOT 0.5 07/09/2021   ALKPHOS 61 07/09/2021   AST 30 07/09/2021   ALT 15 07/09/2021   ANIONGAP 6 07/13/2021    CBG (last 3)  Recent Labs    07/12/21 1745 07/12/21 2327 07/13/21 0459  GLUCAP 129* 101* 86      GFR: Estimated Creatinine Clearance: 38.6 mL/min (by C-G formula based on SCr of 0.91 mg/dL).  Coagulation Profile: Recent Labs  Lab 07/07/21 0732  INR 1.0     Recent Results (from the past 240 hour(s))  Urine Culture     Status: Abnormal   Collection  Time: 07/07/21  2:50 AM   Specimen: Urine, Clean Catch  Result Value Ref Range Status   Specimen Description   Final    URINE, CLEAN CATCH Performed at [facility], [address], [place], [place] [address]    Special Requests   Final    NONE Performed at [facility], [address], [place], [place] [address]    Culture >=100,000 COLONIES/mL ESCHERICHIA COLI (A)  Final   Report Status  07/09/2021 FINAL  Final   Organism ID, Bacteria ESCHERICHIA COLI (A)  Final      Susceptibility   Escherichia coli - MIC*    AMPICILLIN >=32 RESISTANT Resistant     CEFAZOLIN <=4 SENSITIVE Sensitive     CEFEPIME <=0.12 SENSITIVE Sensitive     CEFTRIAXONE <=0.25 SENSITIVE Sensitive     CIPROFLOXACIN <=0.25 SENSITIVE Sensitive     GENTAMICIN <=1 SENSITIVE Sensitive     IMIPENEM <=0.25 SENSITIVE Sensitive     NITROFURANTOIN <=16 SENSITIVE Sensitive     TRIMETH/SULFA >=320 RESISTANT Resistant     AMPICILLIN/SULBACTAM 16 INTERMEDIATE Intermediate     PIP/TAZO <=4 SENSITIVE Sensitive     * >=100,000 COLONIES/mL ESCHERICHIA COLI  Resp Panel by RT-PCR (Flu A&B, Covid) Nasopharyngeal Swab     Status: None   Collection Time: 07/07/21  7:42 AM   Specimen: Nasopharyngeal Swab; Nasopharyngeal(NP) swabs in vial transport medium  Result Value Ref Range Status   SARS Coronavirus 2 by RT PCR NEGATIVE NEGATIVE Final    Comment: (NOTE) SARS-CoV-2 target nucleic acids are NOT DETECTED.  The SARS-CoV-2 RNA is generally detectable in upper respiratory specimens during the acute phase of infection. The lowest concentration of SARS-CoV-2 viral copies this assay can detect is 138 copies/mL. A negative result does not preclude SARS-Cov-2 infection and should not be used as the sole basis for treatment or other patient management decisions. A negative result may occur with  improper specimen collection/handling, submission of specimen other than nasopharyngeal swab, presence of viral mutation(s) within the areas targeted by this assay, and inadequate number of viral copies(<138 copies/mL). A negative result must be combined with clinical observations, patient history, and epidemiological information. The expected result is Negative.  Fact Sheet for Patients:  BloggerCourse.com  Fact Sheet for Healthcare Providers:  SeriousBroker.it  This test is no t  yet approved or cleared by the [place] FDA and  has been authorized for detection and/or diagnosis of SARS-CoV-2 by FDA under an Emergency Use Authorization (EUA). This EUA will remain  in effect (meaning this test can be used) for the duration of the COVID-19 declaration under Section 564(b)(1) of the Act, 21 U.S.C.section 360bbb-3(b)(1), unless the authorization is terminated  or revoked sooner.       Influenza A by PCR NEGATIVE NEGATIVE Final   Influenza B by PCR NEGATIVE NEGATIVE Final    Comment: (NOTE) The Xpert Xpress SARS-CoV-2/FLU/RSV plus assay is intended as an aid in the diagnosis of influenza from Nasopharyngeal swab specimens and should not be used as a sole basis for treatment. Nasal washings and aspirates are unacceptable for Xpert Xpress SARS-CoV-2/FLU/RSV testing.  Fact Sheet for Patients: BloggerCourse.com  Fact Sheet for Healthcare Providers: SeriousBroker.it  This test is not yet approved or cleared by the [place] FDA and has been authorized for detection and/or diagnosis of SARS-CoV-2 by FDA under an Emergency Use Authorization (EUA). This EUA will remain in effect (meaning this test can be used) for the duration of the COVID-19 declaration  under Section 564(b)(1) of the Act, 21 U.S.C. section 360bbb-3(b)(1), unless the authorization is terminated or revoked.  Performed at [facility], [address], [place], [place] [address]   MRSA Next Gen by PCR, Nasal     Status: None   Collection Time: 07/09/21 10:39 PM   Specimen: Nasal Mucosa; Nasal Swab  Result Value Ref Range Status   MRSA by PCR Next Gen NOT DETECTED NOT DETECTED Final    Comment: (NOTE) The GeneXpert MRSA Assay (FDA approved for NASAL specimens only), is one component of a comprehensive MRSA colonization surveillance program. It is not intended to diagnose MRSA infection nor to guide or monitor treatment for  MRSA infections. Test performance is not FDA approved in patients less than 24 years old. Performed at [facility], [address] [place], [place] [address]          Radiology Studies: No results found.      Scheduled Meds:  atenolol  50 mg Oral BID   Chlorhexidine Gluconate Cloth  6 each Topical Daily   diclofenac Sodium  2 g Topical QID   mouth rinse  15 mL Mouth Rinse BID   pantoprazole (PROTONIX) IV  40 mg Intravenous Daily   Continuous Infusions:  sodium chloride Stopped (07/13/21 0530)   cefTRIAXone (ROCEPHIN)  IV Stopped (07/12/21 1738)   heparin 850 Units/hr (07/13/21 0400)   lactated ringers 75 mL/hr at 07/13/21 0400   metronidazole Stopped (07/13/21 0319)     LOS: 6 days     Jacquelin Hawking, MD [facility] 07/13/2021, 7:34 AM  If 7PM-7AM, please contact night-coverage [email or web address]

## 2021-07-14 DIAGNOSIS — R531 Weakness: Secondary | ICD-10-CM | POA: Diagnosis not present

## 2021-07-14 DIAGNOSIS — Z7189 Other specified counseling: Secondary | ICD-10-CM | POA: Diagnosis not present

## 2021-07-14 DIAGNOSIS — I700 Atherosclerosis of aorta: Secondary | ICD-10-CM | POA: Diagnosis not present

## 2021-07-14 DIAGNOSIS — N390 Urinary tract infection, site not specified: Secondary | ICD-10-CM | POA: Diagnosis not present

## 2021-07-14 DIAGNOSIS — Z515 Encounter for palliative care: Secondary | ICD-10-CM | POA: Diagnosis not present

## 2021-07-14 DIAGNOSIS — K625 Hemorrhage of anus and rectum: Secondary | ICD-10-CM | POA: Diagnosis not present

## 2021-07-14 DIAGNOSIS — I4891 Unspecified atrial fibrillation: Secondary | ICD-10-CM | POA: Diagnosis not present

## 2021-07-14 LAB — CBC
HCT: 31.7 % — ABNORMAL LOW (ref 36.0–46.0)
Hemoglobin: 9.8 g/dL — ABNORMAL LOW (ref 12.0–15.0)
MCH: 29.8 pg (ref 26.0–34.0)
MCHC: 30.9 g/dL (ref 30.0–36.0)
MCV: 96.4 fL (ref 80.0–100.0)
Platelets: 243 10*3/uL (ref 150–400)
RBC: 3.29 MIL/uL — ABNORMAL LOW (ref 3.87–5.11)
RDW: 14.1 % (ref 11.5–15.5)
WBC: 17 10*3/uL — ABNORMAL HIGH (ref 4.0–10.5)
nRBC: 0.5 % — ABNORMAL HIGH (ref 0.0–0.2)

## 2021-07-14 LAB — GLUCOSE, CAPILLARY
Glucose-Capillary: 102 mg/dL — ABNORMAL HIGH (ref 70–99)
Glucose-Capillary: 81 mg/dL (ref 70–99)
Glucose-Capillary: 98 mg/dL (ref 70–99)

## 2021-07-14 MED ORDER — SIMETHICONE 80 MG PO CHEW
80.0000 mg | CHEWABLE_TABLET | Freq: Four times a day (QID) | ORAL | Status: DC
  Administered 2021-07-14 (×3): 80 mg via ORAL
  Filled 2021-07-14 (×3): qty 1

## 2021-07-14 MED ORDER — MENTHOL 3 MG MT LOZG: 1.0000 | LOZENGE | OROMUCOSAL | Status: DC | PRN

## 2021-07-14 MED ORDER — PANTOPRAZOLE SODIUM 40 MG IV SOLR
40.0000 mg | Freq: Two times a day (BID) | INTRAVENOUS | Status: DC
  Administered 2021-07-14 – 2021-07-16 (×5): 40 mg via INTRAVENOUS
  Filled 2021-07-14 (×7): qty 40

## 2021-07-14 MED ORDER — CALCIUM POLYCARBOPHIL 625 MG PO TABS
625.0000 mg | ORAL_TABLET | Freq: Two times a day (BID) | ORAL | Status: DC
  Administered 2021-07-14 – 2021-07-15 (×3): 625 mg via ORAL
  Filled 2021-07-14 (×5): qty 1

## 2021-07-14 MED ORDER — PHENOL 1.4 % MT LIQD
2.0000 | OROMUCOSAL | Status: DC | PRN
  Administered 2021-07-14: 2 via OROMUCOSAL
  Filled 2021-07-14: qty 177

## 2021-07-14 MED ORDER — ACETAMINOPHEN 500 MG PO TABS: 1000.0000 mg | ORAL_TABLET | Freq: Four times a day (QID) | ORAL | Status: DC

## 2021-07-14 MED ORDER — BENZONATATE 100 MG PO CAPS: 100.0000 mg | ORAL_CAPSULE | Freq: Three times a day (TID) | ORAL | Status: DC | PRN

## 2021-07-14 MED ORDER — ACETAMINOPHEN 500 MG PO TABS
1000.0000 mg | ORAL_TABLET | Freq: Three times a day (TID) | ORAL | Status: DC
  Administered 2021-07-14 – 2021-07-15 (×5): 1000 mg via ORAL
  Filled 2021-07-14 (×5): qty 2

## 2021-07-14 MED ORDER — MAGIC MOUTHWASH
15.0000 mL | Freq: Four times a day (QID) | ORAL | Status: DC | PRN
  Filled 2021-07-14: qty 15

## 2021-07-14 MED ORDER — LIP MEDEX EX OINT
1.0000 "application " | TOPICAL_OINTMENT | Freq: Two times a day (BID) | CUTANEOUS | Status: DC
  Administered 2021-07-14 – 2021-07-16 (×5): 1 via TOPICAL
  Filled 2021-07-14: qty 7

## 2021-07-14 MED ORDER — METOPROLOL TARTRATE 5 MG/5ML IV SOLN
5.0000 mg | INTRAVENOUS | Status: DC
  Administered 2021-07-14 – 2021-07-15 (×6): 5 mg via INTRAVENOUS
  Filled 2021-07-14 (×6): qty 5

## 2021-07-14 NOTE — Progress Notes (Signed)
PROGRESS NOTE    Debbie Hampton  ZOX:[id number] DOB: 1931/09/17 DOA: 07/07/2021 PCP: Kandyce Rud, MD   Brief Narrative: Debbie Hampton is a 87 y.o. female, SNF resident, extremely hard of hearing, medical history significant for chronic atrial fibrillation on Eliquis, bilateral carotid artery disease, CAD, GERD, hiatal hernia, HTN, HLD, nonhemorrhagic stroke, depression, type II DM, sent from SNF due to rectal bleeding, nausea, vomiting and abdominal pain.  Admitted for possible low volume rectal bleed likely related to hemorrhoids, main issue however was concern for acute acalculous cholecystitis.  [facility] and general surgery were consulted.  Eventually HIDA scan negative.  Progressed to acute abdomen with peritonitis.  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   Assessment & Plan:   Principal Problem:   Rectal bleeding Active Problems:   Hyponatremia   Atrial fibrillation (HCC)   Hypertension   Compression fracture of T11 vertebra (HCC)   Hypothyroidism   Type 2 diabetes mellitus with hyperglycemia (HCC)   Dilated gallbladder   Acute lower UTI   Elevated lipase   Renal artery stenosis (HCC)   HLD (hyperlipidemia)   Aortic atherosclerosis (HCC)   Pressure injury of skin   Acute peritonitis Embolic mesenteric ischemia Patient presented with concern for acute cholecystitis. HIDA scan negative. Patient subsequently worsened with evident of acute abdomen with peritonitis. Patient underwent diagnostic laparoscopy converted to exploratory laparotomy with right colectomy/ileal resection for embolic mesenteric ischemia with primary anastomosis to proximal transverse colon. -General surgery recommendations: Clear liquid diet with plans to advance  Post-op ileus Persistent. No bowel movement yet. NG tube removed by patient. -Surgery recommendations:  clears  Sepsis Not present on admission. Secondary to mesenteric ischemia. IV fluids and antibiotics started. No blood cultures available. Physiology resolved.  Rectal bleeding Likely related to hemorrhoidal bleeding. Resolved.   E. Coli acute cystitis Confirmed on urine culture. Patient started on Ciprofloxacin IV which was transitioned to Ceftriaxone IV. Completed 7 day course of antibiotics  Hyponatremia Mild. Likely related to dehydration. Resolved with IV fluids.  Dehydration Likely secondary to GI losses. Started on IV fluids.  Oliguric AKI Baseline creatinine of about 0.8-0.9. Creatinine of 1.08 on admission with peak of 1.64. Possibly related to ATN. Nephrology consulted with recommendations for increased IV fluids. Creatinine stabilized. UOP of 225 mL over the last 24 hours. Resolved.  Acute metabolic encephalopathy Unknown baseline but concern this may be related to dehydration vs delirium. Mental status appears to be improved this morning. -Management of above issues and watch for improvement -Delirium precautions  Chronic atrial fibrillation Atenolol held secondary to NPO status. Started on metoprolol IV. Heparin IV restarted in place of low-dose Eliquis. Heparin IV held secondary to concern for coffee ground emesis. Discussed with cardiology who recommended increase to q4 hour metoprolol IV while unable to take PO consistently -Increase to metoprolol 5 mg IV q4 hours  Possible coffee ground emesis/upper GI bleed Episode morning of 10/29 while on Heparin IV. Protonix IV started. Heparin IV held. -CBC -Continue Protonix -Hemoccult  Acute anemia Thought to be secondary to acute illness/post-op blood loss and hemodilution. Does not appear to have acute bleeding. CT angio obtained on 10/22 without source for bleed.  Leukocytosis Secondary to mesenteric ischemia. Peak WBC of 21,600 and is now resolved.   Primary hypertension History of left renal artery stenosis on  atenolol and lisinopril. Started on metoprolol while NPO.  History of T11 compression fracture Stable.  Hypothyroidism No outpatient medications noted. Unsure of diagnosis. Cannot confirm at this time. TSH normal.  Diabetes mellitus, type 2 Controlled. Hemoglobin A1C of 5.6%.  Hyperlipidemia Aortic atherosclerosis Patient is on Crestor as an outpatient.  Hearing loss Noted.  Moderate sliding hiatal hernia GERD Noted.  Bilateral pleural effusions Noted on CT scan. Moderate R > L. Layering.  Goals of care Per general surgery discussion with family, Palliative Care consult placed. With regard to nutrition, desire to not place feeding tube or start TPN.   DVT prophylaxis: SCDs Code Status:   Code Status: DNR Family Communication: Son on telephone Disposition Plan: Discharge pending improvement of mental status, advancement of diet, specialist recommendations for discharge.   Consultants:  General surgery [facility]  Procedures:  RIGHT COLECTOMY WITH RESECTION OF THE ILEUM AND PART OF THE JEJUNUM (07/11/2021)  Antimicrobials: Ceftriaxone Ciprofloxacin Flagyl    Subjective: Patient reports wanting some water and coffee. Asking for her son. No other concerns.  Objective: Vitals:   07/14/21 0833 07/14/21 0845 07/14/21 0900 07/14/21 1000  BP: (!) 176/99 (!) 171/102 (!) 148/91 126/69  Pulse: (!) 113 (!) 42 79 (!) 57  Resp: 18 (!) 26 (!) 23 (!) 24  Temp:      TempSrc:      SpO2: 93% 96% 95% 94%  Weight:      Height:        Intake/Output Summary (Last 24 hours) at 07/14/2021 1102 Last data filed at 07/14/2021 1043 Gross per 24 hour  Intake 2239.41 ml  Output 475 ml  Net 1764.41 ml    Filed Weights   07/07/21 0143 07/12/21 0326  Weight: 59.9 kg 62.5 kg    Examination:  General exam: Appears agitated and comfortable and in no acute distress. Conversant Respiratory: Clear to auscultation. Respiratory effort normal with no  intercostal retractions or use of accessory muscles Cardiovascular: S1 & S2 heard, RRR. No murmurs, rubs, gallops or clicks. No edema Gastrointestinal: Abdomen is non-distended, soft and non-tender. No masses felt. Normal bowel sounds heard. Incision with staples intact Neurologic: No focal neurological deficits Musculoskeletal: No calf tenderness Skin: No cyanosis. No new rashes Psychiatry: Alert and oriented to person and place. Memory intact. Mood & affect appropriate   Data Reviewed: I have personally reviewed following labs and imaging studies  CBC Lab Results  Component Value Date   WBC 9.7 07/13/2021   RBC 2.95 (L) 07/13/2021   HGB 8.8 (L) 07/13/2021   HCT 28.8 (L) 07/13/2021   MCV 97.6 07/13/2021   MCH 29.8 07/13/2021   PLT 163 07/13/2021   MCHC 30.6 07/13/2021   RDW 14.2 07/13/2021   LYMPHSABS 0.4 (L) 07/09/2021   MONOABS 1.9 (H) 07/09/2021   EOSABS 0.0 07/09/2021   BASOSABS 0.1 07/09/2021     Last metabolic panel Lab Results  Component Value Date   NA 138 07/13/2021   K 4.0 07/13/2021   CL 108 07/13/2021   CO2 24 07/13/2021   BUN 48 (H) 07/13/2021   CREATININE 0.91 07/13/2021   GLUCOSE 99 07/13/2021   GFRNONAA >60 07/13/2021   GFRAA >60 11/28/2019   CALCIUM 8.8 (L) 07/13/2021   PHOS 3.7 08/22/2018   PROT 5.6 (L) 07/09/2021   ALBUMIN 2.9 (L) 07/09/2021   BILITOT 0.5 07/09/2021   ALKPHOS 61 07/09/2021   AST 30 07/09/2021   ALT 15 07/09/2021   ANIONGAP 6 07/13/2021    CBG (last 3)  Recent Labs    07/13/21 1710 07/13/21 2341 07/14/21 0655  GLUCAP  133* 123* 102*      GFR: Estimated Creatinine Clearance: 38.6 mL/min (by C-G formula based on SCr of 0.91 mg/dL).  Coagulation Profile: No results for input(s): INR, PROTIME in the last 168 hours.   Recent Results (from the past 240 hour(s))  Urine Culture     Status: Abnormal   Collection Time: 07/07/21  2:50 AM   Specimen: Urine, Clean Catch  Result Value Ref Range Status   Specimen  Description   Final    URINE, CLEAN CATCH Performed at [facility], [address] [place], [place] [address]    Special Requests   Final    NONE Performed at [facility], [address] [place], [place] [address]    Culture >=100,000 COLONIES/mL ESCHERICHIA COLI (A)  Final   Report Status 07/09/2021 FINAL  Final   Organism ID, Bacteria ESCHERICHIA COLI (A)  Final      Susceptibility   Escherichia coli - MIC*    AMPICILLIN >=32 RESISTANT Resistant     CEFAZOLIN <=4 SENSITIVE Sensitive     CEFEPIME <=0.12 SENSITIVE Sensitive     CEFTRIAXONE <=0.25 SENSITIVE Sensitive     CIPROFLOXACIN <=0.25 SENSITIVE Sensitive     GENTAMICIN <=1 SENSITIVE Sensitive     IMIPENEM <=0.25 SENSITIVE Sensitive     NITROFURANTOIN <=16 SENSITIVE Sensitive     TRIMETH/SULFA >=320 RESISTANT Resistant     AMPICILLIN/SULBACTAM 16 INTERMEDIATE Intermediate     PIP/TAZO <=4 SENSITIVE Sensitive     * >=100,000 COLONIES/mL ESCHERICHIA COLI  Resp Panel by RT-PCR (Flu A&B, Covid) Nasopharyngeal Swab     Status: None   Collection Time: 07/07/21  7:42 AM   Specimen: Nasopharyngeal Swab; Nasopharyngeal(NP) swabs in vial transport medium  Result Value Ref Range Status   SARS Coronavirus 2 by RT PCR NEGATIVE NEGATIVE Final    Comment: (NOTE) SARS-CoV-2 target nucleic acids are NOT DETECTED.  The SARS-CoV-2 RNA is generally detectable in upper respiratory specimens during the acute phase of infection. The lowest concentration of SARS-CoV-2 viral copies this assay can detect is 138 copies/mL. A negative result does not preclude SARS-Cov-2 infection and should not be used as the sole basis for treatment or other patient management decisions. A negative result may occur with  improper specimen collection/handling, submission of specimen other than nasopharyngeal swab, presence of viral mutation(s) within the areas targeted by this assay, and inadequate number of  viral copies(<138 copies/mL). A negative result must be combined with clinical observations, patient history, and epidemiological information. The expected result is Negative.  Fact Sheet for Patients:  BloggerCourse.com  Fact Sheet for Healthcare Providers:  SeriousBroker.it  This test is no t yet approved or cleared by the [place] FDA and  has been authorized for detection and/or diagnosis of SARS-CoV-2 by FDA under an Emergency Use Authorization (EUA). This EUA will remain  in effect (meaning this test can be used) for the duration of the COVID-19 declaration under Section 564(b)(1) of the Act, 21 U.S.C.section 360bbb-3(b)(1), unless the authorization is terminated  or revoked sooner.       Influenza A by PCR NEGATIVE NEGATIVE Final   Influenza B by PCR NEGATIVE NEGATIVE Final    Comment: (NOTE) The Xpert Xpress SARS-CoV-2/FLU/RSV plus assay is intended as an aid in the diagnosis of influenza from Nasopharyngeal swab specimens and should not be used as a sole basis for treatment. Nasal washings and aspirates are unacceptable for Xpert Xpress SARS-CoV-2/FLU/RSV testing.  Fact Sheet for Patients: BloggerCourse.com  Fact Sheet for Healthcare Providers: SeriousBroker.it  This test is not yet approved or cleared by the [place] FDA and has been authorized for detection and/or diagnosis of SARS-CoV-2 by FDA under an Emergency Use Authorization (EUA). This EUA will remain in effect (meaning this test can be used) for the duration of the COVID-19 declaration under Section 564(b)(1) of the Act, 21 U.S.C. section 360bbb-3(b)(1), unless the authorization is terminated or revoked.  Performed at [facility], [address] [place], [place] [address]   MRSA Next Gen by PCR, Nasal     Status: None   Collection Time: 07/09/21 10:39 PM   Specimen: Nasal  Mucosa; Nasal Swab  Result Value Ref Range Status   MRSA by PCR Next Gen NOT DETECTED NOT DETECTED Final    Comment: (NOTE) The GeneXpert MRSA Assay (FDA approved for NASAL specimens only), is one component of a comprehensive MRSA colonization surveillance program. It is not intended to diagnose MRSA infection nor to guide or monitor treatment for MRSA infections. Test performance is not FDA approved in patients less than 59 years old. Performed at [facility], [address], [place], [place] [address]          Radiology Studies: DG Abd Portable 1V  Result Date: 07/13/2021 CLINICAL DATA:  Postsurgical vomiting EXAM: PORTABLE ABDOMEN - 1 VIEW COMPARISON:  07/09/2021 FINDINGS: A paucity of intra-abdominal gas precludes evaluation of the abdominal gas pattern. There is no gross free intraperitoneal gas. Laparotomy skin staples are seen within the midline. A small amount of gas is seen within a nondistended distal colon. Underpenetration precludes evaluation of the visceral shadows. Degenerative changes are seen within the lumbar spine. Prior T12 vertebroplasty noted. IMPRESSION: Indeterminate bowel-gas pattern due to a paucity of intra-abdominal gas. No gross free air. Electronically Signed   By: Helyn Numbers M.D.   On: 07/13/2021 19:41        Scheduled Meds:  acetaminophen  1,000 mg Oral TID   Chlorhexidine Gluconate Cloth  6 each Topical Daily   diclofenac Sodium  2 g Topical QID   diphenoxylate-atropine  5 mL Oral QID   feeding supplement  237 mL Oral BID BM   lip balm  1 application Topical BID   mouth rinse  15 mL Mouth Rinse BID   metoprolol tartrate  5 mg Intravenous Q4H   pantoprazole (PROTONIX) IV  40 mg Intravenous Q12H   polycarbophil  625 mg Oral BID   simethicone  80 mg Oral QID   Continuous Infusions:  sodium chloride Stopped (07/13/21 0530)   cefTRIAXone (ROCEPHIN)  IV Stopped (07/13/21 1710)   lactated ringers 10 mL/hr at 07/14/21 1056    metronidazole Stopped (07/14/21 0447)     LOS: 7 days     Jacquelin Hawking, MD [facility] 07/14/2021, 11:02 AM  If 7PM-7AM, please contact night-coverage [email or web address]

## 2021-07-14 NOTE — Progress Notes (Signed)
Physical Therapy Treatment Patient Details Name: Debbie Hampton MRN: [id number] DOB: [birth year] Today's Date: 07/14/2021   History of Present Illness 87 year old female, SNF resident, extremely hard of hearing, medical history significant for chronic atrial fibrillation on Eliquis, bilateral carotid artery disease, CAD, GERD, hiatal hernia, HTN, HLD, nonhemorrhagic stroke, depression, type II DM, Kyphoplasty 06/05/21, sent from SNF due to rectal bleeding, nausea, vomiting and abdominal pain.   Progressed to acute abdomen with peritonitis.  S/p diagnostic laparoscopy converted to exploratory laparotomy with right colectomy and ileal resection for mesenteric ischemia consistent with embolic pattern on 68/08/7516.    PT Comments    POD # 5 LAP. Pt seen in [facility] ICU.  Pre medicated for pain.  General Comments: AxO x 2andt VERY HOH and anxious.  "I need my fan" and "give me water".  At rest BP 140/81 with HR 108 which increases to 140's with activity/anxiety.   Assisted OOB to recliner was difficult.  General bed mobility comments: Total "Hellicpter" swival Assist from supine to EOB with increased fear/anxiety.  Then required Min/MinGuard Assist for sitting balance. General transfer comment: due to pt's anxiety/fear, assisted to recliner via "Bear Hug" SPS 1/4 pivot to her RIGHT. + 2 side by side assist to scoot to back od recliner. Positioned in recliner to comfort.  General Gait Details: unable to amb at this time due to weakness and fear falling as well as increased ABD pain with sitting.   Positioned in recliner chair to comfort.  Pt is from a SNF and plans to return.   Recommendations for follow up therapy are one component of a multi-disciplinary discharge planning process, led by the attending physician.  Recommendations may be updated based on patient status, additional functional criteria and insurance authorization.  Follow Up Recommendations  Skilled nursing-short term rehab (<3  hours/day)     Assistance Recommended at Discharge Frequent or constant Supervision/Assistance  Equipment Recommendations  None recommended by PT    Recommendations for Other Services       Precautions / Restrictions Precautions Precautions: Fall Precaution Comments: ABD incision, recent T11 fx, VERY HOH Restrictions Weight Bearing Restrictions: No     Mobility  Bed Mobility Overal bed mobility: Needs Assistance Bed Mobility: Supine to Sit     Supine to sit: Total assist;+2 for physical assistance;+2 for safety/equipment     General bed mobility comments: Total "Hellicpter" swival Assist from supine to EOB with increased fear/anxiety.  Then required Min/MinGuard Assist for sitting balance.    Transfers Overall transfer level: Needs assistance Equipment used: None Transfers: Stand Pivot Transfers   Stand pivot transfers: Total assist;+2 physical assistance         General transfer comment: due to pt's anxiety/fear, assisted to recliner via "Bear Hug" SPS 1/4 pivot to her RIGHT. + 2 side by side assist to scoot to back od recliner. Positioned in recliner to comfort.    Ambulation/Gait         Gait velocity: decreased   General Gait Details: unable to amb at this time due to weakness and fear falling as well as increased ABD pain with sitting.   POD # 5 LAP.   Stairs             Wheelchair Mobility    Modified Rankin (Stroke Patients Only)       Balance  Cognition Arousal/Alertness: Awake/alert Behavior During Therapy: WFL for tasks assessed/performed Overall Cognitive Status: Within Functional Limits for tasks assessed                                 General Comments: AxO x 2andt VERY HOH and anxious.  "I need my fan" and "give me water"        Exercises      General Comments        Pertinent Vitals/Pain Pain Assessment: Faces Faces Pain Scale: Hurts a  little bit Pain Location: abdomen op site Pain Descriptors / Indicators: Discomfort;Grimacing Pain Intervention(s): Monitored during session;Premedicated before session;Repositioned    Home Living                          Prior Function            PT Goals (current goals can now be found in the care plan section) Progress towards PT goals: Goals met and updated - see care plan    Frequency    Min 2X/week      PT Plan Current plan remains appropriate    Co-evaluation              AM-PAC PT "6 Clicks" Mobility   Outcome Measure  Help needed turning from your back to your side while in a flat bed without using bedrails?: Total Help needed moving from lying on your back to sitting on the side of a flat bed without using bedrails?: Total Help needed moving to and from a bed to a chair (including a wheelchair)?: Total Help needed standing up from a chair using your arms (e.g., wheelchair or bedside chair)?: Total Help needed to walk in hospital room?: Total Help needed climbing 3-5 steps with a railing? : Total 6 Click Score: 6    End of Session Equipment Utilized During Treatment: Gait belt Activity Tolerance: Other (comment);Patient limited by fatigue (weakness/anxiety) Patient left: in chair;with call bell/phone within reach;with chair alarm set Nurse Communication: Mobility status;Need for lift equipment (MAXI MOVE back to bed) PT Visit Diagnosis: Unsteadiness on feet (R26.81);Difficulty in walking, not elsewhere classified (R26.2);Muscle weakness (generalized) (M62.81);History of falling (Z91.81)     Time: 5502-7142 PT Time Calculation (min) (ACUTE ONLY): 24 min  Charges:  $Therapeutic Activity: 23-37 mins                    Rica Koyanagi  PTA Acute  Rehabilitation Services Pager      [phone] Office      [phone]

## 2021-07-14 NOTE — Progress Notes (Signed)
HOSPITAL MEDICINE OVERNIGHT EVENT NOTE    Notified by nursing that patient has exhibited 2 episodes of dark brown coffee-ground emesis this morning.  No associated abdominal pain.  Patient is hemodynamically stable.  Chart reviewed, patient is currently being managed for sequela of embolic mesenteric ischemia status post operative intervention.  Patient has been on a heparin infusion as of late due to his history of atrial fibrillation.  We will go ahead and switch Protonix regimen the patient is already on 2 every 12 hours.  We will repeat CBC every 6 hours to monitor hemoglobin and hematocrit.  Finally, will hold heparin infusion for patient's known history of atrial fibrillation temporarily for now.  Based on patient's clinical course over the next several hours and based on coordination with surgery decision can be made as to whether or not gastroenterology needs to be involved.    Marinda Elk  MD [facility]

## 2021-07-14 NOTE — Progress Notes (Signed)
Daily Progress Note   Patient Name: Debbie Hampton       Date: 07/14/2021 DOB: 09-16-1932  Age: 87 y.o. MRN#: [id number] Attending Physician: Narda Bonds, MD Primary Care Physician: Kandyce Rud, MD Admit Date: 07/07/2021  Reason for Consultation/Follow-up: Establishing goals of care  Subjective: Awake alert, currently sitting up in a chair.  Son arrived at bedside, family meeting held, see below. Overnight events noted.  Patient reportedly had 2 episodes of dark coffee-ground emesis earlier this morning.  Hence, her heparin infusion was discontinued and Protonix regimen was reinstituted. I held a family meeting again with the [name] outside the patient's room, see below.   Length of Stay: 7  Current Medications: Scheduled Meds:   acetaminophen  1,000 mg Oral TID   Chlorhexidine Gluconate Cloth  6 each Topical Daily   diclofenac Sodium  2 g Topical QID   diphenoxylate-atropine  5 mL Oral QID   feeding supplement  237 mL Oral BID BM   lip balm  1 application Topical BID   mouth rinse  15 mL Mouth Rinse BID   metoprolol tartrate  5 mg Intravenous Q4H   pantoprazole (PROTONIX) IV  40 mg Intravenous Q12H   polycarbophil  625 mg Oral BID   simethicone  80 mg Oral QID    Continuous Infusions:  sodium chloride Stopped (07/13/21 0530)   lactated ringers 10 mL/hr at 07/14/21 1210    PRN Meds: sodium chloride, antiseptic oral rinse, benzonatate, hydrALAZINE, magic mouthwash, menthol-cetylpyridinium, morphine injection, ondansetron **OR** ondansetron (ZOFRAN) IV, phenol  Physical Exam         Awake alert sitting comfortably in chair Answers questions appropriately, not agitated currently Regular work of breathing Moist oral mucosa Has mild generalized abdominal  tenderness No edema Skin is warm and dry  Vital Signs: BP 140/80   Pulse (!) 122   Temp (!) 97.4 F (36.3 C) (Oral)   Resp (!) 29   Ht 5\' 2"  (1.575 m)   Wt 62.5 kg   SpO2 93%   BMI 25.20 kg/m  SpO2: SpO2: 93 % O2 Device: O2 Device: Room Air O2 Flow Rate: O2 Flow Rate (L/min): 2 L/min  Intake/output summary:  Intake/Output Summary (Last 24 hours) at 07/14/2021 1304 Last data filed at 07/14/2021 1210 Gross per 24 hour  Intake 2388.78  ml  Output 400 ml  Net 1988.78 ml   LBM: Last BM Date: 07/14/21 Baseline Weight: Weight: 59.9 kg Most recent weight: Weight: 62.5 kg      Palliative performance scale 50%. Palliative Assessment/Data:      Patient Active Problem List   Diagnosis Date Noted   Pressure injury of skin 07/13/2021   Rectal bleeding 07/07/2021   Type 2 diabetes mellitus with hyperglycemia (HCC) 07/07/2021   Dilated gallbladder 07/07/2021   Acute lower UTI 07/07/2021   Elevated lipase 07/07/2021   Renal artery stenosis (HCC) 07/07/2021   HLD (hyperlipidemia)    Aortic atherosclerosis (HCC)    Status post kyphoplasty 06/05/2021   Hypothyroidism 05/07/2021   Type 2 diabetes mellitus without complications (HCC) 05/07/2021   Compression fracture of body of thoracic vertebra (HCC) 05/07/2021   Hypertensive urgency 05/06/2021   Compression fracture of T11 vertebra (HCC) 05/06/2021   History of stroke 12/06/2019   Atrial fibrillation, controlled (HCC) 12/06/2019   Atrial fibrillation (HCC)    Hypertension    Elevated troponin 11/26/2019   Closed Colles' fracture of left radius 10/03/2017   Labile blood pressure    Benign essential HTN    Hypertensive crisis    Hyponatremia    Prediabetes    Hypoalbuminemia due to protein-calorie malnutrition (HCC)    Acute blood loss anemia    Stroke (cerebrum) (HCC) 08/01/2017   Acute CVA (cerebrovascular accident) (HCC) 07/29/2017   Syncope, near 03/31/2016   Gastro-esophageal reflux disease without esophagitis  02/15/2014    Palliative Care Assessment & Plan   Patient Profile:    Assessment:  87 year old lady who came from [facility] facility, history of chronic atrial fibrillation, carotid artery disease, coronary artery disease GERD hiatal hernia hypertension dyslipidemia stroke depression diabetes.  Patient was sent because of rectal bleeding nausea vomiting and abdominal pain.  Initially was worked up for possible cholecystitis, progressed to acute abdomen and was seen emergently by surgery.  Patient underwent diagnostic laparoscopy which was converted to exploratory laparotomy.  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 Hospital course also complicated by postop ileus, oliguric acute kidney injury and ongoing acute metabolic encephalopathy. Palliative consultation for goals of care discussions has been requested.  Recommendations/Plan: Family meeting held.  Discussed with interdisciplinary team.  Reviewed goals of care discussions with son [name] who arrived at the bedside.  He is thankful for all the information that is being provided regarding his mother's care.  He remains hopeful for some degree of stabilization/recovery.  He would not be opposed to the patient getting a GI work-up or undergoing an esophageal gastroduodenoscopy if indicated.  He hopes that the patient's oral intake will improve.  He again reiterates that it is the patient's wishes to not have any artificial feeding of any kind.  He is thankful for the support he is getting from the patient's minister who visited yesterday and he also has acquaintances that work for hospice.  He worries if the patient has had a stroke or mini stroke.  We discussed about various neurologic etiologies such as TIA and hospital-acquired delirium among other things.  Offered active listening and supportive care.  He wishes to continue current mode of care and states that  he is encouraged by the fact that the patient's mental status is clear for and that she is tolerating clear liquids.  He states that he believes that the patient wants to get better. Continue current mode of care  and monitor overall hospital course.  Consideration is being given to the patient going to a skilled nursing facility for rehab towards the end of this hospitalization.  If the patient has escalating symptom burden, new problems or ongoing gradual progressive decline, then, at that time, patient's son would like to discuss more about hospice services.  They are familiar with the type of care that is provided inside [facility].  Code Status:    Code Status Orders  (From admission, onward)           Start     Ordered   07/07/21 0734  Do not attempt resuscitation (DNR)  Continuous       Question Answer Comment  In the event of cardiac or respiratory ARREST Do not call a "code blue"   In the event of cardiac or respiratory ARREST Do not perform Intubation, CPR, defibrillation or ACLS   In the event of cardiac or respiratory ARREST Use medication by any route, position, wound care, and other measures to relive pain and suffering. May use oxygen, suction and manual treatment of airway obstruction as needed for comfort.   Comments Discussed CODE STATUS with patient's son who was at the bedside, Yerania Chamorro and she is a DNR.      07/07/21 0733           Code Status History     Date Active Date Inactive Code Status Order ID Comments User Context   06/05/2021 2017 06/06/2021 2125 DNR [id number]  Kennedy Bucker, MD Inpatient   05/06/2021 0851 05/09/2021 1936 DNR [id number]  Lucile Shutters, MD ED   11/26/2019 2020 11/28/2019 2329 DNR [id number]  Lindie Spruce, MD ED   08/22/2018 0548 08/23/2018 1712 DNR [id number]  Barbaraann Rondo, MD Inpatient   08/01/2017 1711 08/13/2017 2110 DNR [id number]  Jacquelynn Cree, PA-C Inpatient   08/01/2017 1710 08/01/2017 1711  DNR [id number]  Jacquelynn Cree, PA-C Inpatient   07/29/2017 1820 08/01/2017 1637 DNR [id number]  Shaune Pollack, MD Inpatient   03/31/2016 1523 04/03/2016 1750 Full Code [id number]  Hower, Cletis [place], MD ED      Advance Directive Documentation    Flowsheet Row Most Recent Value  Type of Advance Directive Healthcare Power of Attorney, Living will  Pre-existing out of facility DNR order (yellow form or pink MOST form) --  "MOST" Form in Place? --       Prognosis:  Unable to determine  Discharge Planning: To Be Determined  Care plan was discussed with patient, RN and son.    Thank you for allowing the Palliative Medicine Team to assist in the care of this patient.   Time In: 12.30 Time Out:  1305 Total Time  35 Prolonged Time Billed  no       Greater than 50%  of this time was spent counseling and coordinating care related to the above assessment and plan.  Rosalin Hawking, MD  Please contact Palliative Medicine Team phone at [phone] for questions and concerns from 7 AM-7 PM.  After 7 PM, contact primary service.

## 2021-07-14 NOTE — Progress Notes (Addendum)
Debbie Hampton [id number] 1932-09-10  CARE TEAM:  PCP: Kandyce Rud, MD  Outpatient Care Team: Patient Care Team: Kandyce Rud, MD as PCP - General (Family Medicine)  Inpatient Treatment Team: Treatment Team: Attending Provider: Narda Bonds, MD; Rounding Team: Delmer Islam, MD; Consulting Physician: Willis Modena, MD; Consulting Physician: Montez Morita Md, MD; Social Worker: Desmond Lope; Utilization Review: Clydia Llano, RN; Physical Therapy Assistant: Arther Dames, PTA; Registered Nurse: Michaele Offer, RN; Social Worker: Darleene Cleaver, LCSW   Problem List:   Principal Problem:   Rectal bleeding Active Problems:   Hyponatremia   Atrial fibrillation (HCC)   Hypertension   Compression fracture of T11 vertebra (HCC)   Hypothyroidism   Type 2 diabetes mellitus with hyperglycemia (HCC)   Dilated gallbladder   Acute lower UTI   Elevated lipase   Renal artery stenosis (HCC)   HLD (hyperlipidemia)   Aortic atherosclerosis (HCC)   Pressure injury of skin   5 Days Post-Op  07/09/2021  Preoperative Diagnosis: Acute abdomen   Postoperative Diagnosis: Mesenteric Ischemia     Surgical Procedure:  Right colectomy with resection of the ileum and part of the jejunum Diagnostic laparoscopy converted to exploratory laparotomy   Operative Team Members:     * Stechschulte, Hyman Hopes, MD - Primary    FINAL MICROSCOPIC DIAGNOSIS:   A. COLON, RIGHT, AND SMALL BOWEL, RESECTION:  - Segment of small intestine (160 cm) and right colon (21 cm) showing  ischemic changes with necrosis and associated acute inflammation  - Separate segment of small intestine (4.5 cm) showing mild ischemic  changes with erosions  - Random sections submitted from mesentery show thromboemboli within  small to medium sized arteries  - The ileal margin shows mild ischemic changes but appears viable  - The colonic margin is uninvolved  - Appendix with fibrous obliteration  - Benign  reactive lymph node    Assessment  Ileus slowly resolving  [facility] Stay = 7 days)  Assessment/Plan Debbie Hampton is an 87 year old female who presented with atrial fibrillation on eliquis who presented with abdominal pain concerning at first for cholecystitis.  Workup was negative but her pain continued and she developed peritonitic signs on exam so she was taken for: dx lap converted to ex lap with R colectomy and ileum resection on 07/09/21 by Dr. Dossie Der, for mesenteric ischemia consistent with an embolic pattern.   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 POD#5 - 90 cm of small bowel left after the ligament of treitz, anastomosis to proximal transverse colon -Pathology benign.  Embolic clots noted in mesentery of ischemic small bowel - AKI/fluid balance - Cr 0.91 today - hgb 8.8 (8.7) on heparin gtt. Continue to monitor -Advance from clears to dysphagia 1 diet.  Consider soft diet tomorrow if stabilized -No need to continue antibiotics from surgery standpoint since 5 days postop.  Defer to primary service since they are using ceftriaxone for acute cystitis E. Coli -Add fiber regimen to help thicken up stools.  Hold off on antidiarrheals unless high output. - palliative following. Family/patient do not want cortrak/TPN which is reasonable  -Concern for coffee-ground emesis.  Agree with proton pump inhibitor.  Follow.  If persistent or dropping hemoglobin, may benefit from EGD and gastrology consultation.  Try and hold off for now Neuro-some confusion.  Not toxic or delirious.  No hypoxia reassurance given.  Defer to primary service and critical care team FEN: CLD ADAT FLD, ensure,  LR @ 38mL/hr ID: ceftriaxone/flagyl VTE: heparin gtt - okay for anticoagulation from surgical perspective   Remainder of care per primary team      20 minutes spent in review, evaluation, examination, counseling, and coordination of care.   I have reviewed this  patient's available data, including medical history, events of note, physical examination and test results as part of my evaluation.  A significant portion of that time was spent in counseling.  Care during the described time interval was provided by me.  07/14/2021    Subjective: (Chief complaint)  " Where is [name]?  I want [name]!"  Notes some soreness in her abdomen.  Objective:  Vital signs:  Vitals:   07/14/21 0800 07/14/21 0824 07/14/21 0845 07/14/21 0900  BP: (!) 188/121 (!) 185/162 (!) 171/102 (!) 148/91  Pulse: (!) 108 66 (!) 42 79  Resp: 20 (!) 21 (!) 26 (!) 23  Temp: (!) 97.4 F (36.3 C)     TempSrc: Oral     SpO2: 95% 97% 96% 95%  Weight:      Height:        Last BM Date: 07/13/21  Intake/Output   Yesterday:  10/28 0701 - 10/29 0700 In: 1031.6 [I.V.:933; IV Piggyback:98.5] Out: 225 [Urine:225] This shift:  Total I/O In: 1557.4 [P.O.:100; I.V.:1357.4; IV Piggyback:100] Out: 250 [Urine:150; Stool:100]  Bowel function:  Flatus: YES  BM:  No  Drain: (No drain)   Physical Exam:  General: Patient sitting comfortably using suctioning.  Not toxic nor sickly.  Upon introducing myself and asking questions, mood changed to some mild distress.  Mostly reassured.  no acute distress Eyes: PERRL, normal EOM.  Sclera clear.  No icterus Neuro: CN II-XII intact w/o focal sensory/motor deficits. Lymph: No head/neck/groin lymphadenopathy Psych:  No delerium/psychosis/paranoia.  Oriented x 1 HENT: Normocephalic, Mucus membranes moist.  No thrush.  Very hard of hearing Neck: Supple, No tracheal deviation.  No obvious thyromegaly Chest: No pain to chest wall compression.  Good respiratory excursion.  No audible wheezing CV:  Pulses intact.  Regular rhythm.  No major extremity edema MS: Normal AROM mjr joints.  No obvious deformity  Abdomen: Soft.  Mildy distended.  Tenderness at central abdomen. Incision clean dry and intact.  No evidence of peritonitis.  No  incarcerated hernias.  Ext:   No deformity.  No mjr edema.  No cyanosis Skin: No petechiae / purpurea.  No major sores.  Warm and dry    Results:    SURGICAL PATHOLOGY  CASE: [id number]  PATIENT: Debbie Hampton  Surgical Pathology Report      Clinical History: Acute abdomen; mesenteric eschemia (crm)      FINAL MICROSCOPIC DIAGNOSIS:   A. COLON, RIGHT, AND SMALL BOWEL, RESECTION:  - Segment of small intestine (160 cm) and right colon (21 cm) showing  ischemic changes with necrosis and associated acute inflammation  - Separate segment of small intestine (4.5 cm) showing mild ischemic  changes with erosions  - Random sections submitted from mesentery show thromboemboli within  small to medium sized arteries  - The ileal margin shows mild ischemic changes but appears viable  - The colonic margin is uninvolved  - Appendix with fibrous obliteration  - Benign reactive lymph node       Avonna Iribe DESCRIPTION:   Received fresh is a segment of intestine consisting of 160 cm of small  intestine and 21 cm of colon.  The serosa throughout is dusky, red-brown  and partially covered by an exudate.  The mucosa in the proximal 4 cm of  small intestine grossly appears viable and is glistening, tan-green.  The remainder of the small intestine mucosa is dusky, red-brown and  there is flattening of the intestinal folds.  The distal 9 cm of colon  mucosa grossly appears viable and is glistening, tan.  The mucosa in the  proximal colon is dusky, red-brown with flattening of the intestinal  folds.  There are no perforations identified.  The appendix is present  and measures 7.5 cm in length and 0.6 cm in diameter.  There are no  thrombi identified in the attached mesentery.  There is a separate  irregular portion of intestine measuring 4.5 x 2.5 x 1.2 cm with  multiple staple lines present.  Sections are submitted in 7 cassettes.  1 = proximal margin  2 = distal margin  3 = distal  small intestine  4 = proximal colon  5 = appendix  6 = mesentery  7 = sections from separate portion of intestine (GRP 07/11/2021)     Final Diagnosis performed by Holley Bouche, MD.   Electronically  signed 07/12/2021  Technical and / or Professional components performed at [facility], [address], [place], [place] [address].   Immunohistochemistry Technical component (if applicable) was performed  at [facility]. [address],  [place], [place] [address].   IMMUNOHISTOCHEMISTRY DISCLAIMER (if applicable):  Some of these immunohistochemical stains may have been developed and the  performance characteristics determine by [facility]. Some  may not have been cleared or approved by the U.S. Food and Drug  Administration. The FDA has determined that such clearance or approval  is not necessary. This test is used for clinical purposes. It should not  be regarded as investigational or for research. This laboratory is  certified under the Clinical Laboratory Improvement Amendments of 1988  (CLIA-88) as qualified to perform high complexity clinical laboratory  testing.  The controls stained appropriately.   Cultures: Recent Results (from the past 720 hour(s))  Urine Culture     Status: Abnormal   Collection Time: 07/07/21  2:50 AM   Specimen: Urine, Clean Catch  Result Value Ref Range Status   Specimen Description   Final    URINE, CLEAN CATCH Performed at [facility], [address] [place], [place] [address]    Special Requests   Final    NONE Performed at [facility], [address] [place], [place] [address]    Culture >=100,000 COLONIES/mL ESCHERICHIA COLI (A)  Final   Report Status 07/09/2021 FINAL  Final   Organism ID, Bacteria ESCHERICHIA COLI (A)  Final      Susceptibility   Escherichia coli - MIC*    AMPICILLIN >=32 RESISTANT Resistant     CEFAZOLIN <=4  SENSITIVE Sensitive     CEFEPIME <=0.12 SENSITIVE Sensitive     CEFTRIAXONE <=0.25 SENSITIVE Sensitive     CIPROFLOXACIN <=0.25 SENSITIVE Sensitive     GENTAMICIN <=1 SENSITIVE Sensitive     IMIPENEM <=0.25 SENSITIVE Sensitive     NITROFURANTOIN <=16 SENSITIVE Sensitive     TRIMETH/SULFA >=320 RESISTANT Resistant     AMPICILLIN/SULBACTAM 16 INTERMEDIATE Intermediate     PIP/TAZO <=4 SENSITIVE Sensitive     * >=100,000 COLONIES/mL ESCHERICHIA COLI  Resp Panel by RT-PCR (Flu A&B, Covid) Nasopharyngeal Swab     Status: None   Collection Time: 07/07/21  7:42 AM   Specimen: Nasopharyngeal Swab;  Nasopharyngeal(NP) swabs in vial transport medium  Result Value Ref Range Status   SARS Coronavirus 2 by RT PCR NEGATIVE NEGATIVE Final    Comment: (NOTE) SARS-CoV-2 target nucleic acids are NOT DETECTED.  The SARS-CoV-2 RNA is generally detectable in upper respiratory specimens during the acute phase of infection. The lowest concentration of SARS-CoV-2 viral copies this assay can detect is 138 copies/mL. A negative result does not preclude SARS-Cov-2 infection and should not be used as the sole basis for treatment or other patient management decisions. A negative result may occur with  improper specimen collection/handling, submission of specimen other than nasopharyngeal swab, presence of viral mutation(s) within the areas targeted by this assay, and inadequate number of viral copies(<138 copies/mL). A negative result must be combined with clinical observations, patient history, and epidemiological information. The expected result is Negative.  Fact Sheet for Patients:  BloggerCourse.com  Fact Sheet for Healthcare Providers:  SeriousBroker.it  This test is no t yet approved or cleared by the [place] FDA and  has been authorized for detection and/or diagnosis of SARS-CoV-2 by FDA under an Emergency Use Authorization (EUA). This EUA will  remain  in effect (meaning this test can be used) for the duration of the COVID-19 declaration under Section 564(b)(1) of the Act, 21 U.S.C.section 360bbb-3(b)(1), unless the authorization is terminated  or revoked sooner.       Influenza A by PCR NEGATIVE NEGATIVE Final   Influenza B by PCR NEGATIVE NEGATIVE Final    Comment: (NOTE) The Xpert Xpress SARS-CoV-2/FLU/RSV plus assay is intended as an aid in the diagnosis of influenza from Nasopharyngeal swab specimens and should not be used as a sole basis for treatment. Nasal washings and aspirates are unacceptable for Xpert Xpress SARS-CoV-2/FLU/RSV testing.  Fact Sheet for Patients: BloggerCourse.com  Fact Sheet for Healthcare Providers: SeriousBroker.it  This test is not yet approved or cleared by the [place] FDA and has been authorized for detection and/or diagnosis of SARS-CoV-2 by FDA under an Emergency Use Authorization (EUA). This EUA will remain in effect (meaning this test can be used) for the duration of the COVID-19 declaration under Section 564(b)(1) of the Act, 21 U.S.C. section 360bbb-3(b)(1), unless the authorization is terminated or revoked.  Performed at [facility], [address] [place], [place] [address]   MRSA Next Gen by PCR, Nasal     Status: None   Collection Time: 07/09/21 10:39 PM   Specimen: Nasal Mucosa; Nasal Swab  Result Value Ref Range Status   MRSA by PCR Next Gen NOT DETECTED NOT DETECTED Final    Comment: (NOTE) The GeneXpert MRSA Assay (FDA approved for NASAL specimens only), is one component of a comprehensive MRSA colonization surveillance program. It is not intended to diagnose MRSA infection nor to guide or monitor treatment for MRSA infections. Test performance is not FDA approved in patients less than 59 years old. Performed at [facility], [address], [place], [place] [address]      Labs: Results for orders placed or performed during the hospital encounter of 07/07/21 (from the past 48 hour(s))  Glucose, capillary     Status: None   Collection Time: 07/12/21 11:57 AM  Result Value Ref Range   Glucose-Capillary 98 70 - 99 mg/dL    Comment: Glucose reference range applies only to samples taken after fasting for at least 8 hours.  Glucose, capillary     Status: Abnormal   Collection Time: 07/12/21  5:45 PM  Result  Value Ref Range   Glucose-Capillary 129 (H) 70 - 99 mg/dL    Comment: Glucose reference range applies only to samples taken after fasting for at least 8 hours.  Glucose, capillary     Status: Abnormal   Collection Time: 07/12/21 11:27 PM  Result Value Ref Range   Glucose-Capillary 101 (H) 70 - 99 mg/dL    Comment: Glucose reference range applies only to samples taken after fasting for at least 8 hours.  Brain natriuretic peptide     Status: Abnormal   Collection Time: 07/13/21  2:51 AM  Result Value Ref Range   B Natriuretic Peptide 849.0 (H) 0.0 - 100.0 pg/mL    Comment: Performed at [facility], [address] [place], [place] [address]  CBC     Status: Abnormal   Collection Time: 07/13/21  2:51 AM  Result Value Ref Range   WBC 9.7 4.0 - 10.5 K/uL   RBC 2.95 (L) 3.87 - 5.11 MIL/uL   Hemoglobin 8.8 (L) 12.0 - 15.0 g/dL   HCT 65.7 (L) 84.6 - 96.2 %   MCV 97.6 80.0 - 100.0 fL   MCH 29.8 26.0 - 34.0 pg   MCHC 30.6 30.0 - 36.0 g/dL   RDW 95.2 84.1 - 32.4 %   Platelets 163 150 - 400 K/uL   nRBC 0.2 0.0 - 0.2 %    Comment: Performed at [facility], [address] [place], [place] [address]  Heparin level (unfractionated)     Status: None   Collection Time: 07/13/21  2:51 AM  Result Value Ref Range   Heparin Unfractionated 0.59 0.30 - 0.70 IU/mL    Comment: (NOTE) The clinical reportable range upper limit is being lowered to >1.10 to align with the FDA approved guidance for the current  laboratory assay.  If heparin results are below expected values, and patient dosage has  been confirmed, suggest follow up testing of antithrombin III levels. Performed at [facility], [address], [place], [place] [address]   Basic metabolic panel     Status: Abnormal   Collection Time: 07/13/21  2:51 AM  Result Value Ref Range   Sodium 138 135 - 145 mmol/L   Potassium 4.0 3.5 - 5.1 mmol/L   Chloride 108 98 - 111 mmol/L   CO2 24 22 - 32 mmol/L   Glucose, Bld 99 70 - 99 mg/dL    Comment: Glucose reference range applies only to samples taken after fasting for at least 8 hours.   BUN 48 (H) 8 - 23 mg/dL   Creatinine, Ser 6.44 0.44 - 1.00 mg/dL   Calcium 8.8 (L) 8.9 - 10.3 mg/dL   GFR, Estimated >03 >47 mL/min    Comment: (NOTE) Calculated using the CKD-EPI Creatinine Equation (2021)    Anion gap 6 5 - 15    Comment: Performed at [facility], [address], [place], [place] [address]  Glucose, capillary     Status: None   Collection Time: 07/13/21  4:59 AM  Result Value Ref Range   Glucose-Capillary 86 70 - 99 mg/dL    Comment: Glucose reference range applies only to samples taken after fasting for at least 8 hours.  Glucose, capillary     Status: Abnormal   Collection Time: 07/13/21 12:29 PM  Result Value Ref Range   Glucose-Capillary 109 (H) 70 - 99 mg/dL    Comment: Glucose reference range applies only to samples taken after fasting for at  least 8 hours.   Comment 1 Notify RN    Comment 2 Document in Chart   Glucose, capillary     Status: Abnormal   Collection Time: 07/13/21  5:10 PM  Result Value Ref Range   Glucose-Capillary 133 (H) 70 - 99 mg/dL    Comment: Glucose reference range applies only to samples taken after fasting for at least 8 hours.  Glucose, capillary     Status: Abnormal   Collection Time: 07/13/21 11:41 PM  Result Value Ref Range   Glucose-Capillary 123 (H) 70 - 99 mg/dL    Comment: Glucose reference range  applies only to samples taken after fasting for at least 8 hours.  Glucose, capillary     Status: Abnormal   Collection Time: 07/14/21  6:55 AM  Result Value Ref Range   Glucose-Capillary 102 (H) 70 - 99 mg/dL    Comment: Glucose reference range applies only to samples taken after fasting for at least 8 hours.    Imaging / Studies: DG Abd Portable 1V  Result Date: 07/13/2021 CLINICAL DATA:  Postsurgical vomiting EXAM: PORTABLE ABDOMEN - 1 VIEW COMPARISON:  07/09/2021 FINDINGS: A paucity of intra-abdominal gas precludes evaluation of the abdominal gas pattern. There is no Raylon Lamson free intraperitoneal gas. Laparotomy skin staples are seen within the midline. A small amount of gas is seen within a nondistended distal colon. Underpenetration precludes evaluation of the visceral shadows. Degenerative changes are seen within the lumbar spine. Prior T12 vertebroplasty noted. IMPRESSION: Indeterminate bowel-gas pattern due to a paucity of intra-abdominal gas. No Summer Parthasarathy free air. Electronically Signed   By: Helyn Numbers M.D.   On: 07/13/2021 19:41    Medications / Allergies: per chart  Antibiotics: Anti-infectives (From admission, onward)    Start     Dose/Rate Route Frequency Ordered Stop   07/08/21 1800  cefTRIAXone (ROCEPHIN) 2 g in sodium chloride 0.9 % 100 mL IVPB        2 g 200 mL/hr over 30 Minutes Intravenous Every 24 hours 07/08/21 1407     07/08/21 1500  metroNIDAZOLE (FLAGYL) IVPB 500 mg        500 mg 100 mL/hr over 60 Minutes Intravenous Every 12 hours 07/08/21 1407     07/07/21 0800  ciprofloxacin (CIPRO) IVPB 400 mg  Status:  Discontinued        400 mg 200 mL/hr over 60 Minutes Intravenous 2 times daily 07/07/21 0733 07/08/21 1407   07/07/21 0730  ciprofloxacin (CIPRO) IVPB 400 mg  Status:  Discontinued        400 mg 200 mL/hr over 60 Minutes Intravenous  Once 07/07/21 0727 07/07/21 0733   07/07/21 0730  metroNIDAZOLE (FLAGYL) IVPB 500 mg        500 mg 100 mL/hr over 60 Minutes  Intravenous  Once 07/07/21 0762 07/07/21 1104         Note: Portions of this report may have been transcribed using voice recognition software. Every effort was made to ensure accuracy; however, inadvertent computerized transcription errors may be present.   Any transcriptional errors that result from this process are unintentional.    Ardeth Sportsman, MD, FACS, MASCRS Esophageal, Gastrointestinal & Colorectal Surgery Robotic and Minimally Invasive Surgery  [facility], [facility]  [address], [address] [place], [place] [address] [phone] Fax [phone] Main  CONTACT INFORMATION:  Weekday (9AM-5PM): Call [facility] main office at [phone]  Weeknight (5PM-9AM) or Weekend/Holiday: Check [email or web address] (password "  [email or web address]") for General Surgery [facility] coverage  (Please, do not use SecureChat as it is not reliable communication to operating surgeons for immediate patient care)      07/14/2021  9:29 AM

## 2021-07-15 ENCOUNTER — Inpatient Hospital Stay (HOSPITAL_COMMUNITY): Payer: Medicare Other

## 2021-07-15 ENCOUNTER — Encounter (HOSPITAL_COMMUNITY): Payer: Self-pay | Admitting: Internal Medicine

## 2021-07-15 DIAGNOSIS — E039 Hypothyroidism, unspecified: Secondary | ICD-10-CM

## 2021-07-15 DIAGNOSIS — K55059 Acute (reversible) ischemia of intestine, part and extent unspecified: Secondary | ICD-10-CM

## 2021-07-15 DIAGNOSIS — I4821 Permanent atrial fibrillation: Secondary | ICD-10-CM | POA: Diagnosis not present

## 2021-07-15 DIAGNOSIS — E871 Hypo-osmolality and hyponatremia: Secondary | ICD-10-CM

## 2021-07-15 DIAGNOSIS — R531 Weakness: Secondary | ICD-10-CM

## 2021-07-15 DIAGNOSIS — R748 Abnormal levels of other serum enzymes: Secondary | ICD-10-CM

## 2021-07-15 DIAGNOSIS — N179 Acute kidney failure, unspecified: Secondary | ICD-10-CM | POA: Diagnosis not present

## 2021-07-15 LAB — BASIC METABOLIC PANEL
Anion gap: 9 (ref 5–15)
BUN: 23 mg/dL (ref 8–23)
CO2: 23 mmol/L (ref 22–32)
Calcium: 9.2 mg/dL (ref 8.9–10.3)
Chloride: 105 mmol/L (ref 98–111)
Creatinine, Ser: 0.56 mg/dL (ref 0.44–1.00)
GFR, Estimated: >60 mL/min (ref >60)
Glucose, Bld: 126 mg/dL — ABNORMAL HIGH (ref 70–99)
Potassium: 3.7 mmol/L (ref 3.5–5.1)
Sodium: 137 mmol/L (ref 135–145)

## 2021-07-15 LAB — CBC
HCT: 32.2 % — ABNORMAL LOW (ref 36.0–46.0)
HCT: 36.1 % (ref 36.0–46.0)
Hemoglobin: 10.4 g/dL — ABNORMAL LOW (ref 12.0–15.0)
Hemoglobin: 11.3 g/dL — ABNORMAL LOW (ref 12.0–15.0)
MCH: 29.6 pg (ref 26.0–34.0)
MCH: 29.8 pg (ref 26.0–34.0)
MCHC: 31.3 g/dL (ref 30.0–36.0)
MCHC: 32.3 g/dL (ref 30.0–36.0)
MCV: 92.3 fL (ref 80.0–100.0)
MCV: 94.5 fL (ref 80.0–100.0)
Platelets: 247 10*3/uL (ref 150–400)
Platelets: 312 10*3/uL (ref 150–400)
RBC: 3.49 MIL/uL — ABNORMAL LOW (ref 3.87–5.11)
RBC: 3.82 MIL/uL — ABNORMAL LOW (ref 3.87–5.11)
RDW: 14.3 % (ref 11.5–15.5)
RDW: 14.4 % (ref 11.5–15.5)
WBC: 14.2 10*3/uL — ABNORMAL HIGH (ref 4.0–10.5)
WBC: 20 10*3/uL — ABNORMAL HIGH (ref 4.0–10.5)
nRBC: 0.3 % — ABNORMAL HIGH (ref 0.0–0.2)
nRBC: 0.5 % — ABNORMAL HIGH (ref 0.0–0.2)

## 2021-07-15 LAB — GLUCOSE, CAPILLARY
Glucose-Capillary: 117 mg/dL — ABNORMAL HIGH (ref 70–99)
Glucose-Capillary: 106 mg/dL — ABNORMAL HIGH (ref 70–99)
Glucose-Capillary: 115 mg/dL — ABNORMAL HIGH (ref 70–99)
Glucose-Capillary: 122 mg/dL — ABNORMAL HIGH (ref 70–99)
Glucose-Capillary: 130 mg/dL — ABNORMAL HIGH (ref 70–99)

## 2021-07-15 LAB — TROPONIN I (HIGH SENSITIVITY): Troponin I (High Sensitivity): 26 ng/L — ABNORMAL HIGH (ref <18)

## 2021-07-15 LAB — MAGNESIUM: Magnesium: 1.7 mg/dL (ref 1.7–2.4)

## 2021-07-15 LAB — HEPARIN LEVEL (UNFRACTIONATED): Heparin Unfractionated: <0.1 IU/mL — ABNORMAL LOW (ref 0.30–0.70)

## 2021-07-15 MED ORDER — LOPERAMIDE HCL 2 MG PO CAPS: 2.0000 mg | ORAL_CAPSULE | Freq: Two times a day (BID) | ORAL | Status: DC | PRN

## 2021-07-15 MED ORDER — WITCH HAZEL-GLYCERIN EX PADS
1.0000 "application " | MEDICATED_PAD | CUTANEOUS | Status: DC | PRN
  Filled 2021-07-15: qty 100

## 2021-07-15 MED ORDER — DIPHENOXYLATE-ATROPINE 2.5-0.025 MG PO TABS: 1.0000 | ORAL_TABLET | Freq: Four times a day (QID) | ORAL | Status: DC | PRN

## 2021-07-15 MED ORDER — IOHEXOL 350 MG/ML SOLN
100.0000 mL | Freq: Once | INTRAVENOUS | Status: AC | PRN
  Administered 2021-07-15: 100 mL via INTRAVENOUS

## 2021-07-15 MED ORDER — FUROSEMIDE 10 MG/ML IJ SOLN
20.0000 mg | Freq: Once | INTRAMUSCULAR | Status: AC
  Administered 2021-07-15: 20 mg via INTRAVENOUS
  Filled 2021-07-15: qty 2

## 2021-07-15 MED ORDER — HEPARIN (PORCINE) 25000 UT/250ML-% IV SOLN
1000.0000 [IU]/h | INTRAVENOUS | Status: DC
  Administered 2021-07-15: 850 [IU]/h via INTRAVENOUS
  Filled 2021-07-15: qty 250

## 2021-07-15 MED ORDER — SODIUM CHLORIDE 0.45 % IV SOLN: INTRAVENOUS | Status: DC

## 2021-07-15 MED ORDER — ATENOLOL 25 MG PO TABS
50.0000 mg | ORAL_TABLET | Freq: Two times a day (BID) | ORAL | Status: DC
  Administered 2021-07-15: 50 mg via ORAL
  Filled 2021-07-15: qty 2

## 2021-07-15 MED ORDER — NYSTATIN 100000 UNIT/ML MT SUSP
5.0000 mL | Freq: Four times a day (QID) | OROMUCOSAL | Status: DC
  Administered 2021-07-15 – 2021-07-16 (×4): 500000 [IU] via ORAL
  Filled 2021-07-15 (×4): qty 5

## 2021-07-15 MED ORDER — FERROUS SULFATE 325 (65 FE) MG PO TABS
325.0000 mg | ORAL_TABLET | Freq: Two times a day (BID) | ORAL | Status: DC
  Administered 2021-07-15 (×2): 325 mg via ORAL
  Filled 2021-07-15 (×3): qty 1

## 2021-07-15 MED ORDER — SIMETHICONE 80 MG PO CHEW: 80.0000 mg | CHEWABLE_TABLET | Freq: Four times a day (QID) | ORAL | Status: DC | PRN

## 2021-07-15 MED ORDER — METOPROLOL TARTRATE 5 MG/5ML IV SOLN
5.0000 mg | Freq: Four times a day (QID) | INTRAVENOUS | Status: DC | PRN
  Administered 2021-07-15: 5 mg via INTRAVENOUS
  Filled 2021-07-15: qty 5

## 2021-07-15 MED ORDER — DIPHENOXYLATE-ATROPINE 2.5-0.025 MG PO TABS
2.0000 | ORAL_TABLET | Freq: Every day | ORAL | Status: DC
  Administered 2021-07-15: 2 via ORAL
  Filled 2021-07-15: qty 2

## 2021-07-15 NOTE — Progress Notes (Addendum)
Debbie Hampton [id number] June 26, 1932  CARE TEAM:  PCP: Debbie Rud, MD  Outpatient Care Team: Patient Care Team: Debbie Rud, MD as PCP - General (Family Medicine)  Inpatient Treatment Team: Treatment Team: Attending Provider: Narda Bonds, MD; Rounding Team: Debbie Islam, MD; Consulting Physician: Debbie Modena, MD; Consulting Physician: Debbie Morita, Md, MD; Social Worker: Debbie Hampton; Charge Nurse: Debbie Carnes, RN; Charge Nurse: Debbie Curl, RN; Technician: Debbie Hampton, NT; Utilization Review: Debbie Llano, RN; Technician: Debbie Hampton, NT; Registered Nurse: Debbie Brim, RN; Pharmacist: Debbie Hampton, [facility]   Problem List:   Principal Problem:   Rectal bleeding Active Problems:   Hyponatremia   Atrial fibrillation (HCC)   Hypertension   Compression fracture of T11 vertebra (HCC)   Hypothyroidism   Type 2 diabetes mellitus with hyperglycemia (HCC)   Dilated gallbladder   Acute lower UTI   Elevated lipase   Renal artery stenosis (HCC)   HLD (hyperlipidemia)   Aortic atherosclerosis (HCC)   Pressure injury of skin   Palliative care by specialist   Goals of care, counseling/discussion   General weakness   6 Days Post-Op  07/09/2021  Preoperative Diagnosis: Acute abdomen   Postoperative Diagnosis: Mesenteric Ischemia     Surgical Procedure:  Right colectomy with resection of the ileum and part of the jejunum Diagnostic laparoscopy converted to exploratory laparotomy   Operative Team Members:     * Stechschulte, Hyman Hopes, MD - Primary    FINAL MICROSCOPIC DIAGNOSIS:   A. COLON, RIGHT, AND SMALL BOWEL, RESECTION:  - Segment of small intestine (160 cm) and right colon (21 cm) showing  ischemic changes with necrosis and associated acute inflammation  - Separate segment of small intestine (4.5 cm) showing mild ischemic  changes with erosions  - Random sections submitted from mesentery show thromboemboli within  small  to medium sized arteries  - The ileal margin shows mild ischemic changes but appears viable  - The colonic margin is uninvolved  - Appendix with fibrous obliteration  - Benign reactive lymph node    Assessment  Ileus resolved  Loose BMs  [facility] Stay = 8 days)  Assessment/Plan [name] is an 87 year old female who presented with atrial fibrillation on eliquis who presented with abdominal pain concerning at first for cholecystitis.  Workup was negative but her pain continued and she developed peritonitic signs on exam so she was taken for: dx lap converted to ex lap with R colectomy and ileum resection on 07/09/21 by Dr. Dossie Hampton, for mesenteric ischemia consistent with an embolic pattern.   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 6 Days Post-Op - 90 cm of small bowel left after the ligament of treitz, anastomosis to proximal transverse colon -Pathology benign.  Embolic clots noted in mesentery of ischemic small bowel - AKI/fluid balance - Cr 0.91 today - hgb 8.8 (8.7) on heparin gtt. Continue to monitor -Advance to dysphagia 3 diet.  -No need to continue antibiotics from surgery standpoint since 5 days postop.  Defer to primary service since they are using ceftriaxone for acute cystitis E. Coli  -Fiber regimen to help thicken up stools.  Slowly add antidiarrheals since loose BMs.  AVOID RECTAL TUBE with colon anastomosis  - palliative following. Family/patient do not want cortrak/TPN which is reasonable  -Concern for coffee-ground emesis.  Agree with proton pump inhibitor.  Follow.  If persistent or dropping hemoglobin, may benefit from EGD and gastrology consultation.  Try and hold off for now Neuro-some confusion.  Not toxic or delirious.  No hypoxia reassurance given.  Defer to primary service and critical care team FEN: CLD ADAT FLD, ensure, LR @ 69mL/hr ID: ceftriaxone/flagyl VTE: heparin gtt - okay for anticoagulation from  surgical perspective   Remainder of care per primary team      20 minutes spent in review, evaluation, examination, counseling, and coordination of care.   I have reviewed this patient's available data, including medical history, events of note, physical examination and test results as part of my evaluation.  A significant portion of that time was spent in counseling.  Care during the described time interval was provided by me.  07/15/2021    Subjective: (Chief complaint)   No nausea or vomiting.  Not much of an appetite but tolerating dysphagia 1 diet.  Complains of abdominal soreness but not worse  Objective:  Vital signs:  Vitals:   07/15/21 0600 07/15/21 0617 07/15/21 0626 07/15/21 0657  BP: (!) 178/105 (!) 220/93 (!) 197/103 (!) 163/97  Pulse: 92 (!) 105 84 (!) 111  Resp: 20 20 20  (!) 21  Temp:      TempSrc:      SpO2: 95% 94% 93% 94%  Weight:      Height:        Last BM Date: 07/14/21  Intake/Output   Yesterday:  10/29 0701 - 10/30 0700 In: 2256.7 [P.O.:340; I.V.:1816.7; IV Piggyback:100] Out: 850 [Urine:525; Stool:325] This shift:  No intake/output data recorded.  Bowel function:  Flatus: YES  BM:  YES  Drain: (No drain)   Physical Exam:  General: Patient confused but somewhat directable.  Mild/dementia/delirium stable Mostly reassured.  no acute distress Eyes: PERRL, normal EOM.  Sclera clear.  No icterus Neuro: CN II-XII intact w/o focal sensory/motor deficits. Lymph: No head/neck/groin lymphadenopathy Psych:  No delerium/psychosis/paranoia.  Oriented x 1 HENT: Normocephalic, Mucus membranes moist.  No thrush.  Very hard of hearing Neck: Supple, No tracheal deviation.  No obvious thyromegaly Chest: No pain to chest wall compression.  Good respiratory excursion.  No audible wheezing CV:  Pulses intact.  Regular rhythm.  No major extremity edema MS: Normal AROM mjr joints.  No obvious deformity  Abdomen: Soft.  Mildy distended.  Tenderness  at central abdomen. Incision clean dry and intact.  No evidence of peritonitis.  No incarcerated hernias.  Ext:   No deformity.  No mjr edema.  No cyanosis Skin: No petechiae / purpurea.  No major sores.  Warm and dry    Results:    SURGICAL PATHOLOGY  CASE: [id number]  PATIENT: [name]  Surgical Pathology Report      Clinical History: Acute abdomen; mesenteric eschemia (crm)      FINAL MICROSCOPIC DIAGNOSIS:   A. COLON, RIGHT, AND SMALL BOWEL, RESECTION:  - Segment of small intestine (160 cm) and right colon (21 cm) showing  ischemic changes with necrosis and associated acute inflammation  - Separate segment of small intestine (4.5 cm) showing mild ischemic  changes with erosions  - Random sections submitted from mesentery show thromboemboli within  small to medium sized arteries  - The ileal margin shows mild ischemic changes but appears viable  - The colonic margin is uninvolved  - Appendix with fibrous obliteration  - Benign reactive lymph node       Bunnie Rehberg DESCRIPTION:   Received fresh is a segment of intestine consisting of 160 cm of small  intestine and 21 cm of colon.  The serosa throughout is dusky, red-brown  and partially covered by an exudate.  The mucosa in the proximal 4 cm of  small intestine grossly appears viable and is glistening, tan-green.  The remainder of the small intestine mucosa is dusky, red-brown and  there is flattening of the intestinal folds.  The distal 9 cm of colon  mucosa grossly appears viable and is glistening, tan.  The mucosa in the  proximal colon is dusky, red-brown with flattening of the intestinal  folds.  There are no perforations identified.  The appendix is present  and measures 7.5 cm in length and 0.6 cm in diameter.  There are no  thrombi identified in the attached mesentery.  There is a separate  irregular portion of intestine measuring 4.5 x 2.5 x 1.2 cm with  multiple staple lines present.  Sections are  submitted in 7 cassettes.  1 = proximal margin  2 = distal margin  3 = distal small intestine  4 = proximal colon  5 = appendix  6 = mesentery  7 = sections from separate portion of intestine (GRP 07/11/2021)     Final Diagnosis performed by Holley Bouche, MD.   Electronically  signed 07/12/2021  Technical and / or Professional components performed at [facility], [address] [place], [place] [address].   Immunohistochemistry Technical component (if applicable) was performed  at [facility]. [address], [address],  [place], [place] [address].   IMMUNOHISTOCHEMISTRY DISCLAIMER (if applicable):  Some of these immunohistochemical stains may have been developed and the  performance characteristics determine by [facility]. Some  may not have been cleared or approved by the U.S. Food and Drug  Administration. The FDA has determined that such clearance or approval  is not necessary. This test is used for clinical purposes. It should not  be regarded as investigational or for research. This laboratory is  certified under the Clinical Laboratory Improvement Amendments of 1988  (CLIA-88) as qualified to perform high complexity clinical laboratory  testing.  The controls stained appropriately.   Cultures: Recent Results (from the past 720 hour(s))  Urine Culture     Status: Abnormal   Collection Time: 07/07/21  2:50 AM   Specimen: Urine, Clean Catch  Result Value Ref Range Status   Specimen Description   Final    URINE, CLEAN CATCH Performed at [facility], [address] [place] [address], [place], [place] [address]    Special Requests   Final    NONE Performed at [facility], [address] [place], [place] [address]    Culture >=100,000 COLONIES/mL ESCHERICHIA COLI (A)  Final   Report Status 07/09/2021 FINAL  Final   Organism ID, Bacteria ESCHERICHIA COLI (A)  Final      Susceptibility    Escherichia coli - MIC*    AMPICILLIN >=32 RESISTANT Resistant     CEFAZOLIN <=4 SENSITIVE Sensitive     CEFEPIME <=0.12 SENSITIVE Sensitive     CEFTRIAXONE <=0.25 SENSITIVE Sensitive     CIPROFLOXACIN <=0.25 SENSITIVE Sensitive     GENTAMICIN <=1 SENSITIVE Sensitive     IMIPENEM <=0.25 SENSITIVE Sensitive     NITROFURANTOIN <=16 SENSITIVE Sensitive     TRIMETH/SULFA >=320 RESISTANT Resistant     AMPICILLIN/SULBACTAM 16 INTERMEDIATE Intermediate     PIP/TAZO <=4 SENSITIVE Sensitive     * >=100,000 COLONIES/mL ESCHERICHIA COLI  Resp Panel by RT-PCR (Flu A&B, Covid) Nasopharyngeal Swab     Status:  None   Collection Time: 07/07/21  7:42 AM   Specimen: Nasopharyngeal Swab; Nasopharyngeal(NP) swabs in vial transport medium  Result Value Ref Range Status   SARS Coronavirus 2 by RT PCR NEGATIVE NEGATIVE Final    Comment: (NOTE) SARS-CoV-2 target nucleic acids are NOT DETECTED.  The SARS-CoV-2 RNA is generally detectable in upper respiratory specimens during the acute phase of infection. The lowest concentration of SARS-CoV-2 viral copies this assay can detect is 138 copies/mL. A negative result does not preclude SARS-Cov-2 infection and should not be used as the sole basis for treatment or other patient management decisions. A negative result may occur with  improper specimen collection/handling, submission of specimen other than nasopharyngeal swab, presence of viral mutation(s) within the areas targeted by this assay, and inadequate number of viral copies(<138 copies/mL). A negative result must be combined with clinical observations, patient history, and epidemiological information. The expected result is Negative.  Fact Sheet for Patients:  BloggerCourse.com  Fact Sheet for Healthcare Providers:  SeriousBroker.it  This test is no t yet approved or cleared by the [place] FDA and  has been authorized for detection and/or  diagnosis of SARS-CoV-2 by FDA under an Emergency Use Authorization (EUA). This EUA will remain  in effect (meaning this test can be used) for the duration of the COVID-19 declaration under Section 564(b)(1) of the Act, 21 U.S.C.section 360bbb-3(b)(1), unless the authorization is terminated  or revoked sooner.       Influenza A by PCR NEGATIVE NEGATIVE Final   Influenza B by PCR NEGATIVE NEGATIVE Final    Comment: (NOTE) The Xpert Xpress SARS-CoV-2/FLU/RSV plus assay is intended as an aid in the diagnosis of influenza from Nasopharyngeal swab specimens and should not be used as a sole basis for treatment. Nasal washings and aspirates are unacceptable for Xpert Xpress SARS-CoV-2/FLU/RSV testing.  Fact Sheet for Patients: BloggerCourse.com  Fact Sheet for Healthcare Providers: SeriousBroker.it  This test is not yet approved or cleared by the [place] FDA and has been authorized for detection and/or diagnosis of SARS-CoV-2 by FDA under an Emergency Use Authorization (EUA). This EUA will remain in effect (meaning this test can be used) for the duration of the COVID-19 declaration under Section 564(b)(1) of the Act, 21 U.S.C. section 360bbb-3(b)(1), unless the authorization is terminated or revoked.  Performed at [facility], [address], [place], [place] [address]   MRSA Next Gen by PCR, Nasal     Status: None   Collection Time: 07/09/21 10:39 PM   Specimen: Nasal Mucosa; Nasal Swab  Result Value Ref Range Status   MRSA by PCR Next Gen NOT DETECTED NOT DETECTED Final    Comment: (NOTE) The GeneXpert MRSA Assay (FDA approved for NASAL specimens only), is one component of a comprehensive MRSA colonization surveillance program. It is not intended to diagnose MRSA infection nor to guide or monitor treatment for MRSA infections. Test performance is not FDA approved in patients less than 43  years old. Performed at [facility], [address], [place], [place] [address]     Labs: Results for orders placed or performed during the hospital encounter of 07/07/21 (from the past 48 hour(s))  Glucose, capillary     Status: Abnormal   Collection Time: 07/13/21 12:29 PM  Result Value Ref Range   Glucose-Capillary 109 (H) 70 - 99 mg/dL    Comment: Glucose reference range applies only to samples taken after fasting for at least 8 hours.   Comment 1  Notify RN    Comment 2 Document in Chart   Glucose, capillary     Status: Abnormal   Collection Time: 07/13/21  5:10 PM  Result Value Ref Range   Glucose-Capillary 133 (H) 70 - 99 mg/dL    Comment: Glucose reference range applies only to samples taken after fasting for at least 8 hours.  Glucose, capillary     Status: Abnormal   Collection Time: 07/13/21 11:41 PM  Result Value Ref Range   Glucose-Capillary 123 (H) 70 - 99 mg/dL    Comment: Glucose reference range applies only to samples taken after fasting for at least 8 hours.  Glucose, capillary     Status: Abnormal   Collection Time: 07/14/21  6:55 AM  Result Value Ref Range   Glucose-Capillary 102 (H) 70 - 99 mg/dL    Comment: Glucose reference range applies only to samples taken after fasting for at least 8 hours.  Glucose, capillary     Status: None   Collection Time: 07/14/21 11:48 AM  Result Value Ref Range   Glucose-Capillary 81 70 - 99 mg/dL    Comment: Glucose reference range applies only to samples taken after fasting for at least 8 hours.   Comment 1 Notify RN    Comment 2 Document in Chart   Glucose, capillary     Status: None   Collection Time: 07/14/21  6:14 PM  Result Value Ref Range   Glucose-Capillary 98 70 - 99 mg/dL    Comment: Glucose reference range applies only to samples taken after fasting for at least 8 hours.  CBC     Status: Abnormal   Collection Time: 07/14/21  6:42 PM  Result Value Ref Range   WBC 17.0 (H) 4.0 - 10.5 K/uL    RBC 3.29 (L) 3.87 - 5.11 MIL/uL   Hemoglobin 9.8 (L) 12.0 - 15.0 g/dL   HCT 16.1 (L) 09.6 - 04.5 %   MCV 96.4 80.0 - 100.0 fL   MCH 29.8 26.0 - 34.0 pg   MCHC 30.9 30.0 - 36.0 g/dL   RDW 40.9 81.1 - 91.4 %   Platelets 243 150 - 400 K/uL   nRBC 0.5 (H) 0.0 - 0.2 %    Comment: Performed at [facility], [address] [place], [place] [address]  Glucose, capillary     Status: Abnormal   Collection Time: 07/14/21 11:29 PM  Result Value Ref Range   Glucose-Capillary 117 (H) 70 - 99 mg/dL    Comment: Glucose reference range applies only to samples taken after fasting for at least 8 hours.  CBC     Status: Abnormal   Collection Time: 07/14/21 11:33 PM  Result Value Ref Range   WBC 14.2 (H) 4.0 - 10.5 K/uL   RBC 3.82 (L) 3.87 - 5.11 MIL/uL   Hemoglobin 11.3 (L) 12.0 - 15.0 g/dL   HCT 62.1 30.8 - 65.7 %   MCV 94.5 80.0 - 100.0 fL   MCH 29.6 26.0 - 34.0 pg   MCHC 31.3 30.0 - 36.0 g/dL   RDW 84.6 96.2 - 95.2 %   Platelets 247 150 - 400 K/uL   nRBC 0.5 (H) 0.0 - 0.2 %    Comment: Performed at [facility], [address] [place], [place] [address]  Glucose, capillary     Status: Abnormal   Collection Time: 07/15/21  6:08 AM  Result Value Ref Range   Glucose-Capillary 106 (H) 70 - 99 mg/dL  Comment: Glucose reference range applies only to samples taken after fasting for at least 8 hours.    Imaging / Studies: DG Abd Portable 1V  Result Date: 07/13/2021 CLINICAL DATA:  Postsurgical vomiting EXAM: PORTABLE ABDOMEN - 1 VIEW COMPARISON:  07/09/2021 FINDINGS: A paucity of intra-abdominal gas precludes evaluation of the abdominal gas pattern. There is no Gennette Shadix free intraperitoneal gas. Laparotomy skin staples are seen within the midline. A small amount of gas is seen within a nondistended distal colon. Underpenetration precludes evaluation of the visceral shadows. Degenerative changes are seen within the lumbar spine. Prior T12 vertebroplasty noted.  IMPRESSION: Indeterminate bowel-gas pattern due to a paucity of intra-abdominal gas. No Arriana Lohmann free air. Electronically Signed   By: Helyn Numbers M.D.   On: 07/13/2021 19:41    Medications / Allergies: per chart  Antibiotics: Anti-infectives (From admission, onward)    Start     Dose/Rate Route Frequency Ordered Stop   07/08/21 1800  cefTRIAXone (ROCEPHIN) 2 g in sodium chloride 0.9 % 100 mL IVPB  Status:  Discontinued        2 g 200 mL/hr over 30 Minutes Intravenous Every 24 hours 07/08/21 1407 07/14/21 1105   07/08/21 1500  metroNIDAZOLE (FLAGYL) IVPB 500 mg  Status:  Discontinued        500 mg 100 mL/hr over 60 Minutes Intravenous Every 12 hours 07/08/21 1407 07/14/21 1159   07/07/21 0800  ciprofloxacin (CIPRO) IVPB 400 mg  Status:  Discontinued        400 mg 200 mL/hr over 60 Minutes Intravenous 2 times daily 07/07/21 0733 07/08/21 1407   07/07/21 0730  ciprofloxacin (CIPRO) IVPB 400 mg  Status:  Discontinued        400 mg 200 mL/hr over 60 Minutes Intravenous  Once 07/07/21 0727 07/07/21 0733   07/07/21 0730  metroNIDAZOLE (FLAGYL) IVPB 500 mg        500 mg 100 mL/hr over 60 Minutes Intravenous  Once 07/07/21 7482 07/07/21 1104         Note: Portions of this report may have been transcribed using voice recognition software. Every effort was made to ensure accuracy; however, inadvertent computerized transcription errors may be present.   Any transcriptional errors that result from this process are unintentional.    Ardeth Sportsman, MD, FACS, MASCRS Esophageal, Gastrointestinal & Colorectal Surgery Robotic and Minimally Invasive Surgery  [facility], [facility]  [facility]  [address] [place], [place] [address] [phone] Fax [phone] Main  CONTACT INFORMATION:  Weekday (9AM-5PM): Call [facility] main office at [phone]  Weeknight (5PM-9AM) or Weekend/Holiday: Check [email or web address] (password " [email or web address]") for  General Surgery [facility] coverage  (Please, do not use SecureChat as it is not reliable communication to operating surgeons for immediate patient care)      07/15/2021  7:17 AM

## 2021-07-15 NOTE — Progress Notes (Signed)
ANTICOAGULATION CONSULT NOTE - Follow Up Consult  Pharmacy Consult for Heparin Indication: atrial fibrillation  Allergies  Allergen Reactions   Cheese     GOAT cheese   Mushroom Extract Complex    Penicillins     Has patient had a PCN reaction causing immediate rash, facial/tongue/throat swelling, SOB or lightheadedness with hypotension: Yes Has patient had a PCN reaction causing severe rash involving mucus membranes or skin necrosis: No Has patient had a PCN reaction that required hospitalization No Has patient had a PCN reaction occurring within the last 10 years: Yes If all of the above answers are "NO", then may proceed with Cephalosporin use.    Verapamil    Aloe Hives and Rash   Amlodipine Hives, Rash and Hypertension   Cephalexin Rash    Tolerated Ancef 09 of 2022   Metoprolol Hives, Palpitations and Hypertension    Succinate   Miconazole Swelling and Rash   Neosporin [Neomycin-Bacitracin Zn-Polymyx] Rash   Trandolapril-Verapamil Hcl Er Rash    Patient Measurements: Height: 5\' 2"  (157.5 cm) Weight: 62.5 kg (137 lb 12.6 oz) IBW/kg (Calculated) : 50.1 Heparin Dosing Weight: TBW  Vital Signs: Temp: 97.6 F (36.4 C) (10/30 0800) Temp Source: Oral (10/30 0800) BP: 153/69 (10/30 0800) Pulse Rate: 112 (10/30 0800)  Labs: Recent Labs    07/13/21 0251 07/14/21 1842 07/14/21 2333 07/15/21 0905  HGB 8.8* 9.8* 11.3* 10.4*  HCT 28.8* 31.7* 36.1 32.2*  PLT 163 243 247 312  HEPARINUNFRC 0.59  --   --   --   CREATININE 0.91  --   --  0.56     Estimated Creatinine Clearance: 43.9 mL/min (by C-G formula based on SCr of 0.56 mg/dL).   Medications:  Infusions:   sodium chloride 10 mL/hr at 07/15/21 0000   lactated ringers 10 mL/hr at 07/15/21 0000    Assessment: 42 yoF with PMH of afib admitted for rectal bleeding and abdominal pain.  She was previously on apixaban, but held in September for rectal bleeding. 10/24: diagnostic laparoscopy converted to exploratory  laparotomy with right colectomy and ileal resection for mesenteric ischemia consistent with embolic pattern.Pharmacy is consulted to dose Heparin IV on 10/25 postop.    Significant events: 10/29 AM: heparin held for 2 episodes of dark brown coffee-ground emesis 10/30: Per MD, No recurrent episode. Hemoglobin stable. Unsure if this was true hematemesis.  Goal of Therapy:  Heparin level 0.3-0.7 units/ml Monitor platelets by anticoagulation protocol: Yes   Plan:  Will not bolus heparin Resume previous rate of heparin IV infusion at 850 units/hr Check heparin level in 8hrs Daily heparin level and CBC while on heparin Continue to monitor H&H and platelets postop F/U long term anticoagulation plan   11/30, PharmD, BCPS [facility] main pharmacy [phone] 07/15/2021 9:45 AM

## 2021-07-15 NOTE — Significant Event (Signed)
  Reason for Call :  During shift change at approximately 1915 alerted by patient's son that her breathing has changed and seems labored.  Initial Focused Assessment:  Patient is appearing to breathe heavier, her lungs sound congested with crackles and rhonchi. Her extremities x 4 seem very edematous and cool to touch. She is slow to respond, but does follow commands. Her speech is garbled and appears that her right side of face and body move better than the left.  Interventions:  Called Dr. Yolanda Bonine and he is coming to the bedside to evaluate. Upon evaluation he ordered a CODE STROKE, HEAD CT. Took patient to CT and obtained Tele Neuro assessment in CT. New orders obtained for CTA also. Attempted to place new IV for CTA, unable to place. STAT IV Team consult placed.   Plan of Care:  Follow through on all orders and MD to evaluate data from ordered tests. MD will discuss results with family. Son is waiting in patient's room.   Lamona Curl, RN

## 2021-07-15 NOTE — Progress Notes (Signed)
ANTICOAGULATION CONSULT NOTE - Follow Up Consult  Pharmacy Consult for Heparin Indication: atrial fibrillation  Allergies  Allergen Reactions   Cheese     GOAT cheese   Mushroom Extract Complex    Penicillins     Has patient had a PCN reaction causing immediate rash, facial/tongue/throat swelling, SOB or lightheadedness with hypotension: Yes Has patient had a PCN reaction causing severe rash involving mucus membranes or skin necrosis: No Has patient had a PCN reaction that required hospitalization No Has patient had a PCN reaction occurring within the last 10 years: Yes If all of the above answers are "NO", then may proceed with Cephalosporin use.    Verapamil    Aloe Hives and Rash   Amlodipine Hives, Rash and Hypertension   Cephalexin Rash    Tolerated Ancef 09 of 2022   Metoprolol Hives, Palpitations and Hypertension    Succinate   Miconazole Swelling and Rash   Neosporin [Neomycin-Bacitracin Zn-Polymyx] Rash   Trandolapril-Verapamil Hcl Er Rash    Patient Measurements: Height: 5\' 2"  (157.5 cm) Weight: 62.5 kg (137 lb 12.6 oz) IBW/kg (Calculated) : 50.1 Heparin Dosing Weight: TBW  Vital Signs: Temp: 97.7 F (36.5 C) (10/30 1200) Temp Source: Oral (10/30 1200) BP: 112/66 (10/30 1800) Pulse Rate: 118 (10/30 1700)  Labs: Recent Labs    07/13/21 0251 07/14/21 1842 07/14/21 2333 07/15/21 0905  HGB 8.8* 9.8* 11.3* 10.4*  HCT 28.8* 31.7* 36.1 32.2*  PLT 163 243 247 312  HEPARINUNFRC 0.59  --   --   --   CREATININE 0.91  --   --  0.56     Estimated Creatinine Clearance: 43.9 mL/min (by C-G formula based on SCr of 0.56 mg/dL).   Medications:  Infusions:   sodium chloride 75 mL/hr at 07/15/21 1836   sodium chloride Stopped (07/15/21 1425)   heparin 850 Units/hr (07/15/21 1836)   lactated ringers Stopped (07/15/21 1102)    Assessment: 83 yoF with PMH of afib admitted for rectal bleeding and abdominal pain.  She was previously on apixaban, but held in  September for rectal bleeding. 10/24: diagnostic laparoscopy converted to exploratory laparotomy with right colectomy and ileal resection for mesenteric ischemia consistent with embolic pattern.Pharmacy is consulted to dose Heparin IV on 10/25 postop.    Significant events: 10/29 AM: heparin held for 2 episodes of dark brown coffee-ground emesis 10/30: Per MD, No recurrent episode. Hemoglobin stable. Unsure if this was true hematemesis. 10/30 PM: HL undetectable (<0.10) on 850 units/hr.  Heparin drip was not stopped per RN, and no episodes of coffee-ground emesis.  Goal of Therapy:  Heparin level 0.3-0.7 units/ml Monitor platelets by anticoagulation protocol: Yes   Plan:  Increase heparin drip to 1000 units/hr Check heparin level in 8hrs Daily heparin level and CBC while on heparin Continue to monitor H&H and platelets postop F/U long term anticoagulation plan   11/30, PharmD 07/15/2021 7:00 PM

## 2021-07-15 NOTE — Progress Notes (Signed)
PROGRESS NOTE    Debbie Hampton  PJA:[id number] DOB: August 30, 1932 DOA: 07/07/2021 PCP: Kandyce Rud, MD   Brief Narrative: Debbie Hampton is a 87 y.o. female, SNF resident, extremely hard of hearing, medical history significant for chronic atrial fibrillation on Eliquis, bilateral carotid artery disease, CAD, GERD, hiatal hernia, HTN, HLD, nonhemorrhagic stroke, depression, type II DM, sent from SNF due to rectal bleeding, nausea, vomiting and abdominal pain.  Admitted for possible low volume rectal bleed likely related to hemorrhoids, main issue however was concern for acute acalculous cholecystitis.  [facility] and general surgery were consulted.  Eventually HIDA scan negative.  Progressed to acute abdomen with peritonitis.  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   Assessment & Plan:   Principal Problem:   Acute intestinal ischemia s/p ileocolonic resection 07/09/2021 Active Problems:   Hyponatremia   Atrial fibrillation (HCC)   Hypertension   Compression fracture of T11 vertebra (HCC)   Hypothyroidism   Rectal bleeding   Type 2 diabetes mellitus with hyperglycemia (HCC)   Dilated gallbladder   Acute lower UTI   Elevated lipase   Renal artery stenosis (HCC)   HLD (hyperlipidemia)   Aortic atherosclerosis (HCC)   Pressure injury of skin   Palliative care by specialist   Goals of care, counseling/discussion   General weakness   Acute peritonitis Embolic mesenteric ischemia Patient presented with concern for acute cholecystitis. HIDA scan negative. Patient subsequently worsened with evident of acute abdomen with peritonitis. Patient underwent diagnostic laparoscopy converted to exploratory laparotomy with right colectomy/ileal resection for embolic mesenteric ischemia with primary anastomosis to proximal transverse colon. -General surgery  recommendations: dysphagia 3 diet  Post-op ileus Persistent. No bowel movement yet. NG tube removed by patient. -Surgery recommendations: dysphagia 3 diet  Sepsis Not present on admission. Secondary to mesenteric ischemia. IV fluids and antibiotics started. No blood cultures available. Physiology resolved.  Rectal bleeding Likely related to hemorrhoidal bleeding. Resolved.   E. Coli acute cystitis Confirmed on urine culture. Patient started on Ciprofloxacin IV which was transitioned to Ceftriaxone IV. Completed 7 day course of antibiotics  Hyponatremia Mild. Likely related to dehydration. Resolved with IV fluids.  Dehydration Likely secondary to GI losses. Started on IV fluids.  Oliguric AKI Baseline creatinine of about 0.8-0.9. Creatinine of 1.08 on admission with peak of 1.64. Possibly related to ATN. Nephrology consulted with recommendations for increased IV fluids. Creatinine stabilized. UOP of 525 mL over the last 24 hours. Resolved.  Acute metabolic encephalopathy Unknown baseline but concern this may be related to dehydration vs delirium. Mental status appears to be improved this morning. -Management of above issues and watch for improvement -Delirium precautions  Chronic atrial fibrillation Atenolol held secondary to NPO status. Started on metoprolol IV. Heparin IV restarted in place of low-dose Eliquis. Heparin IV held secondary to concern for coffee ground emesis. Discussed with cardiology who recommended increase to q4 hour metoprolol IV while unable to take PO consistently -Change to metoprolol IV prn -Start home atenolol PO  Possible coffee ground emesis/upper GI bleed Episode morning of 10/29 while on Heparin IV. Protonix IV started. Heparin IV held. No recurrent episode. Hemoglobin stable. Unsure if this was true hematemesis. -CBC -Continue Protonix -Hemoccult -Restart heparin drip  Acute anemia Thought to be secondary to acute illness/post-op blood loss and  hemodilution. Does not appear to have acute bleeding. CT angio obtained on 10/22 without source for bleed.  Leukocytosis  Secondary to mesenteric ischemia. Peak WBC of 21,600 and is now resolved.   Primary hypertension History of left renal artery stenosis on atenolol and lisinopril. Started on metoprolol while NPO.  History of T11 compression fracture Stable.  Hypothyroidism No outpatient medications noted. Unsure of diagnosis. Cannot confirm at this time. TSH normal.  Diabetes mellitus, type 2 Controlled. Hemoglobin A1C of 5.6%.  Hyperlipidemia Aortic atherosclerosis Patient is on Crestor as an outpatient.  Hearing loss Noted.  Moderate sliding hiatal hernia GERD Noted.  Bilateral pleural effusions Noted on CT scan. Moderate R > L. Layering.  Goals of care Per general surgery discussion with family, Palliative Care consult placed. With regard to nutrition, desire to not place feeding tube or start TPN.   DVT prophylaxis: SCDs, Heparin drip Code Status:   Code Status: DNR Family Communication: None at bedside Disposition Plan: Discharge pending improvement of mental status, advancement of diet, specialist recommendations for discharge, transition to oral anticoagulation.   Consultants:  General surgery [facility]  Procedures:  RIGHT COLECTOMY WITH RESECTION OF THE ILEUM AND PART OF THE JEJUNUM (07/11/2021)  Antimicrobials: Ceftriaxone Ciprofloxacin Flagyl    Subjective: No concerns overnight. Asking about yogurt today. No abdominal pain, nausea or vomiting. Patient had some PO intake yesterday  Objective: Vitals:   07/15/21 0626 07/15/21 0657 07/15/21 0700 07/15/21 0800  BP: (!) 197/103 (!) 163/97    Pulse: 84 (!) 111 95   Resp: 20 (!) 21 20   Temp:    97.6 F (36.4 C)  TempSrc:    Oral  SpO2: 93% 94% 94%   Weight:      Height:        Intake/Output Summary (Last 24 hours) at 07/15/2021 0928 Last data filed at 07/15/2021  0830 Gross per 24 hour  Intake 819.3 ml  Output 600 ml  Net 219.3 ml    Filed Weights   07/07/21 0143 07/12/21 0326  Weight: 59.9 kg 62.5 kg    Examination:  General exam: Appears mostly calm and comfortable Respiratory system: Clear to auscultation. Respiratory effort normal. Cardiovascular system: S1 & S2 heard, RRR. No murmurs, rubs, gallops or clicks. Gastrointestinal system: Abdomen is nondistended, soft and nontender. No organomegaly or masses felt. Normal bowel sounds heard. Central nervous system: Alert and oriented. No focal neurological deficits. Musculoskeletal: No edema. No calf tenderness Skin: No cyanosis. No rashes Psychiatry: Judgement and insight appear impaired.   Data Reviewed: I have personally reviewed following labs and imaging studies  CBC Lab Results  Component Value Date   WBC 20.0 (H) 07/15/2021   RBC 3.49 (L) 07/15/2021   HGB 10.4 (L) 07/15/2021   HCT 32.2 (L) 07/15/2021   MCV 92.3 07/15/2021   MCH 29.8 07/15/2021   PLT 312 07/15/2021   MCHC 32.3 07/15/2021   RDW 14.3 07/15/2021   LYMPHSABS 0.4 (L) 07/09/2021   MONOABS 1.9 (H) 07/09/2021   EOSABS 0.0 07/09/2021   BASOSABS 0.1 07/09/2021     Last metabolic panel Lab Results  Component Value Date   NA 138 07/13/2021   K 4.0 07/13/2021   CL 108 07/13/2021   CO2 24 07/13/2021   BUN 48 (H) 07/13/2021   CREATININE 0.91 07/13/2021   GLUCOSE 99 07/13/2021   GFRNONAA >60 07/13/2021   GFRAA >60 11/28/2019   CALCIUM 8.8 (L) 07/13/2021   PHOS 3.7 08/22/2018   PROT 5.6 (L) 07/09/2021   ALBUMIN 2.9 (L) 07/09/2021   BILITOT 0.5 07/09/2021   ALKPHOS 61 07/09/2021   AST  30 07/09/2021   ALT 15 07/09/2021   ANIONGAP 6 07/13/2021    CBG (last 3)  Recent Labs    07/14/21 1814 07/14/21 2329 07/15/21 0608  GLUCAP 98 117* 106*      GFR: Estimated Creatinine Clearance: 38.6 mL/min (by C-G formula based on SCr of 0.91 mg/dL).  Coagulation Profile: No results for input(s): INR, PROTIME  in the last 168 hours.   Recent Results (from the past 240 hour(s))  Urine Culture     Status: Abnormal   Collection Time: 07/07/21  2:50 AM   Specimen: Urine, Clean Catch  Result Value Ref Range Status   Specimen Description   Final    URINE, CLEAN CATCH Performed at [facility], [address], [place], [place] [address]    Special Requests   Final    NONE Performed at [facility], [address], [place], [place] [address]    Culture >=100,000 COLONIES/mL ESCHERICHIA COLI (A)  Final   Report Status 07/09/2021 FINAL  Final   Organism ID, Bacteria ESCHERICHIA COLI (A)  Final      Susceptibility   Escherichia coli - MIC*    AMPICILLIN >=32 RESISTANT Resistant     CEFAZOLIN <=4 SENSITIVE Sensitive     CEFEPIME <=0.12 SENSITIVE Sensitive     CEFTRIAXONE <=0.25 SENSITIVE Sensitive     CIPROFLOXACIN <=0.25 SENSITIVE Sensitive     GENTAMICIN <=1 SENSITIVE Sensitive     IMIPENEM <=0.25 SENSITIVE Sensitive     NITROFURANTOIN <=16 SENSITIVE Sensitive     TRIMETH/SULFA >=320 RESISTANT Resistant     AMPICILLIN/SULBACTAM 16 INTERMEDIATE Intermediate     PIP/TAZO <=4 SENSITIVE Sensitive     * >=100,000 COLONIES/mL ESCHERICHIA COLI  Resp Panel by RT-PCR (Flu A&B, Covid) Nasopharyngeal Swab     Status: None   Collection Time: 07/07/21  7:42 AM   Specimen: Nasopharyngeal Swab; Nasopharyngeal(NP) swabs in vial transport medium  Result Value Ref Range Status   SARS Coronavirus 2 by RT PCR NEGATIVE NEGATIVE Final    Comment: (NOTE) SARS-CoV-2 target nucleic acids are NOT DETECTED.  The SARS-CoV-2 RNA is generally detectable in upper respiratory specimens during the acute phase of infection. The lowest concentration of SARS-CoV-2 viral copies this assay can detect is 138 copies/mL. A negative result does not preclude SARS-Cov-2 infection and should not be used as the sole basis for treatment or other patient management decisions. A negative result  may occur with  improper specimen collection/handling, submission of specimen other than nasopharyngeal swab, presence of viral mutation(s) within the areas targeted by this assay, and inadequate number of viral copies(<138 copies/mL). A negative result must be combined with clinical observations, patient history, and epidemiological information. The expected result is Negative.  Fact Sheet for Patients:  BloggerCourse.com  Fact Sheet for Healthcare Providers:  SeriousBroker.it  This test is no t yet approved or cleared by the [place] FDA and  has been authorized for detection and/or diagnosis of SARS-CoV-2 by FDA under an Emergency Use Authorization (EUA). This EUA will remain  in effect (meaning this test can be used) for the duration of the COVID-19 declaration under Section 564(b)(1) of the Act, 21 U.S.C.section 360bbb-3(b)(1), unless the authorization is terminated  or revoked sooner.       Influenza A by PCR NEGATIVE NEGATIVE Final   Influenza B by PCR NEGATIVE NEGATIVE Final    Comment: (NOTE) The Xpert Xpress SARS-CoV-2/FLU/RSV plus assay is intended as an aid in the diagnosis of influenza  from Nasopharyngeal swab specimens and should not be used as a sole basis for treatment. Nasal washings and aspirates are unacceptable for Xpert Xpress SARS-CoV-2/FLU/RSV testing.  Fact Sheet for Patients: BloggerCourse.com  Fact Sheet for Healthcare Providers: SeriousBroker.it  This test is not yet approved or cleared by the [place] FDA and has been authorized for detection and/or diagnosis of SARS-CoV-2 by FDA under an Emergency Use Authorization (EUA). This EUA will remain in effect (meaning this test can be used) for the duration of the COVID-19 declaration under Section 564(b)(1) of the Act, 21 U.S.C. section 360bbb-3(b)(1), unless the authorization is terminated  or revoked.  Performed at [facility], [address], [place], [place] [address]   MRSA Next Gen by PCR, Nasal     Status: None   Collection Time: 07/09/21 10:39 PM   Specimen: Nasal Mucosa; Nasal Swab  Result Value Ref Range Status   MRSA by PCR Next Gen NOT DETECTED NOT DETECTED Final    Comment: (NOTE) The GeneXpert MRSA Assay (FDA approved for NASAL specimens only), is one component of a comprehensive MRSA colonization surveillance program. It is not intended to diagnose MRSA infection nor to guide or monitor treatment for MRSA infections. Test performance is not FDA approved in patients less than 57 years old. Performed at [facility], [address], [place], [place] [address]          Radiology Studies: DG Abd Portable 1V  Result Date: 07/13/2021 CLINICAL DATA:  Postsurgical vomiting EXAM: PORTABLE ABDOMEN - 1 VIEW COMPARISON:  07/09/2021 FINDINGS: A paucity of intra-abdominal gas precludes evaluation of the abdominal gas pattern. There is no gross free intraperitoneal gas. Laparotomy skin staples are seen within the midline. A small amount of gas is seen within a nondistended distal colon. Underpenetration precludes evaluation of the visceral shadows. Degenerative changes are seen within the lumbar spine. Prior T12 vertebroplasty noted. IMPRESSION: Indeterminate bowel-gas pattern due to a paucity of intra-abdominal gas. No gross free air. Electronically Signed   By: Helyn Numbers M.D.   On: 07/13/2021 19:41        Scheduled Meds:  acetaminophen  1,000 mg Oral TID   Chlorhexidine Gluconate Cloth  6 each Topical Daily   diclofenac Sodium  2 g Topical QID   diphenoxylate-atropine  2 tablet Oral Q breakfast   feeding supplement  237 mL Oral BID BM   ferrous sulfate  325 mg Oral BID WC   lip balm  1 application Topical BID   mouth rinse  15 mL Mouth Rinse BID   metoprolol tartrate  5 mg Intravenous Q4H   pantoprazole  (PROTONIX) IV  40 mg Intravenous Q12H   polycarbophil  625 mg Oral BID   Continuous Infusions:  sodium chloride 10 mL/hr at 07/15/21 0000   lactated ringers 10 mL/hr at 07/15/21 0000     LOS: 8 days     Jacquelin Hawking, MD [facility] 07/15/2021, 9:28 AM  If 7PM-7AM, please contact night-coverage [email or web address]

## 2021-07-15 NOTE — Consult Note (Addendum)
[facility] Tele[facility]rology Consult Services   Patient Name:   Debbie Hampton, Debbie Hampton Date of Birth:   01/31/1932 Identification Number:   MRN - [id number] Date of Service:   07/15/2021 20:16:32  Diagnosis:       I63.9 - Cerebrovascular accident (CVA), unspecified mechanism ([place])  Impression:      87 y/o woman with acute onset of left-sided weakness/numbness and gaze deviation concerning for right MCA territory stroke. Hx of afib, normally on anticoagulation but held during this hospitalization due to abdominal surgery and just restarted on heparin this morning so she is at risk of having developed clots prior to restarting heparin. Not a thrombolytic candidate due to multiple contraindications including recent surgery and heparin. CTA/P ordered to evaluate for LVO, although I think unlikely she would be a thrombolytic candidate due to multiple medical issues and goals of care.  Metrics: Last Known Well: 07/15/2021 19:00:00 [facility] Notification Time: 07/15/2021 20:16:32 Stamp Time: 07/15/2021 20:16:32 Initial Response Time: 07/15/2021 20:19:49 Symptoms: AMS, left-sided weakness. NIHSS Start Assessment Time: 07/15/2021 [id number] Patient is not a candidate for Thrombolytic. Thrombolytic Medical Decision: 07/15/2021 20:20:22 Patient was not deemed candidate for Thrombolytic because of following reasons: heparin drip, mesenteric ishemia .  CT head was reviewed and results were: I personally Reviewed the CT Head and it Showed No Acute Hemorrhage or Acute Core Infarct  Consulting physician Notified of Diagnostic Impression and Management Plan: 07/15/2021 20:51:20 Able to Reach 07/15/2021 20:51:20  Advanced Imaging: CTA Head and Neck Ordered:  CTP Ordered:   Our recommendations are outlined below.  Recommendations:        Stroke/Telemetry Floor       Neuro Checks       Bedside Swallow Eval       DVT Prophylaxis       IV Fluids, Normal Saline       Head of Bed  30 Degrees       Euglycemia and Avoid Hyperthermia (PRN Acetaminophen)       Hold Anticoagulation for Now       Brain MRI w/o contrast  Routine Consultation with [facility] for Follow up Care  Sign Out:       Discussed with Primary Attending    ------------------------------------------------------------------------------  History of Present Illness: Patient is a 87 year old Female.  Inpatient stroke alert was called for symptoms of AMS, left-sided weakness.  Patient is admitted since 10/22 for GI issues, found to have mesenteric ischemia. Last known at neurologic baseline 19:00 today, per RN she was alert and interactive with no focal weakness prior to that, and then she became acutely altered and having LUE weakness and facial droop, in setting of increased work of breathing. She is normally anticoagulated for afib, this was held earlier in the hospitalization, was started on a heparin drip this morning.    Past Medical History:      Hypertension      Diabetes Mellitus      Hyperlipidemia      Atrial Fibrillation      Coronary Artery Disease      Stroke  Anticoagulant use:  Yes heparin drip  Antiplatelet use: No  Allergies:  Reviewed     Examination: BP(173/89), Pulse(118), 1A: Level of Consciousness - Arouses to minor stimulation + 1 1B: Ask Month and Age - 1 Question Right + 1 1C: Blink Eyes & Squeeze Hands - Performs Both Tasks + 0 2: Test Horizontal Extraocular Movements - Forced Gaze Palsy: Cannot Be Overcome + 2 3: Test  Visual Fields - Patient is Bilaterally Blind + 3 4: Test Facial Palsy (Use Grimace if Obtunded) - Partial paralysis (lower face) + 2 5A: Test Left Arm Motor Drift - No Movement + 4 5B: Test Right Arm Motor Drift - No Effort Against Gravity + 3 6A: Test Left Leg Motor Drift - No Effort Against Gravity + 3 6B: Test Right Leg Motor Drift - No Effort Against Gravity + 3 7: Test Limb Ataxia (FNF/Heel-Shin) - No Ataxia + 0 8: Test Sensation  - Complete Loss: Cannot Sense Being Touched At All + 2 9: Test Language/Aphasia - Normal; No aphasia + 0 10: Test Dysarthria - Mild-Moderate Dysarthria: Slurring but can be understood + 1 11: Test Extinction/Inattention - No abnormality + 0  NIHSS Score: 25   Pre-Morbid Modified Rankin Scale: 3 Points = Moderate disability; requiring some help, but able to walk without assistance   Patient/Family was informed the Neurology Consult would occur via TeleHealth consult by way of interactive audio and video telecommunications and consented to receiving care in this manner.   Patient is being evaluated for possible acute neurologic impairment and high probability of imminent or life-threatening deterioration. I spent total of 35 minutes providing care to this patient, including time for face to face visit via telemedicine, review of medical records, imaging studies and discussion of findings with providers, the patient and/or family.   Dr Sherilyn Cooter   [facility] [phone]  Case [id number]  Addendum: Advanced Imaging:  CTA Head and Neck Completed.  CTP Completed.  LVO:Yes  Discussed with NIR:No  Discussed with NIR Text:  Right M2 occlusion with 31cc core infarct including the motor strip, and 14cc adjacent penumbra. Given significant core infarct and patient's underlying level of disability with mRS 3 at best, intervention is not indicated

## 2021-07-15 NOTE — Progress Notes (Signed)
Notified by RN that pt with dyspnea and increased work of breathing with O2 sat dropping to 88% on RA. Was placed on O2 by n/c and now saturating in mid to upper 90s.  Has decreased mental alertness and garbled speech which began 30 minutes ago. Is not moving left arm as she previously was and has left drooping of face. Early today was alert and oriented and able to answer questions appropriately per RN and family who is at bedside.  Heparin was resumed this am for a-fib.   VSS CV-Irregularly irregular. No murmur.  Lungs-Equal breath sounds but diminished in bases, diffuse rales. Crackles in bases.  Abdomen-Soft, nontender, nondistended.  Extremities-SCDs in place on legs. Upper extremities with 2+ edema. Decreased movement of left upper extremity.  Moves right arm spontaneously.  Neuro-Speech garbled and hard to understand. Grip strength 2/5 on left, 4/5 on right. Left facial droop present.    Code stroke initiated.  CT head ordered STAT.  Check CXR, BNP, troponin, VBG.  Given lasix 20 mg IV now.

## 2021-07-16 ENCOUNTER — Other Ambulatory Visit (HOSPITAL_COMMUNITY): Payer: Medicare Other

## 2021-07-16 DIAGNOSIS — K55059 Acute (reversible) ischemia of intestine, part and extent unspecified: Secondary | ICD-10-CM | POA: Diagnosis not present

## 2021-07-16 DIAGNOSIS — Z7189 Other specified counseling: Secondary | ICD-10-CM | POA: Diagnosis not present

## 2021-07-16 DIAGNOSIS — R531 Weakness: Secondary | ICD-10-CM | POA: Diagnosis not present

## 2021-07-16 LAB — BLOOD GAS, VENOUS
Acid-Base Excess: 0.5 mmol/L (ref 0.0–2.0)
Bicarbonate: 26.8 mmol/L (ref 20.0–28.0)
O2 Saturation: 25.8 %
Patient temperature: 98.6
pCO2, Ven: 53 mmHg (ref 44.0–60.0)
pH, Ven: 7.324 (ref 7.250–7.430)
pO2, Ven: <31 mmHg — CL (ref 32.0–45.0)

## 2021-07-16 LAB — GLUCOSE, CAPILLARY: Glucose-Capillary: 116 mg/dL — ABNORMAL HIGH (ref 70–99)

## 2021-07-16 LAB — BASIC METABOLIC PANEL
Anion gap: 8 (ref 5–15)
BUN: 21 mg/dL (ref 8–23)
CO2: 25 mmol/L (ref 22–32)
Calcium: 9 mg/dL (ref 8.9–10.3)
Chloride: 103 mmol/L (ref 98–111)
Creatinine, Ser: 0.47 mg/dL (ref 0.44–1.00)
GFR, Estimated: >60 mL/min (ref >60)
Glucose, Bld: 122 mg/dL — ABNORMAL HIGH (ref 70–99)
Potassium: 3.5 mmol/L (ref 3.5–5.1)
Sodium: 136 mmol/L (ref 135–145)

## 2021-07-16 LAB — BRAIN NATRIURETIC PEPTIDE: B Natriuretic Peptide: 489.5 pg/mL — ABNORMAL HIGH (ref 0.0–100.0)

## 2021-07-16 LAB — HEPARIN LEVEL (UNFRACTIONATED): Heparin Unfractionated: <0.1 IU/mL — ABNORMAL LOW (ref 0.30–0.70)

## 2021-07-16 LAB — TROPONIN I (HIGH SENSITIVITY): Troponin I (High Sensitivity): 24 ng/L — ABNORMAL HIGH (ref <18)

## 2021-07-16 MED ORDER — HYDROMORPHONE HCL 1 MG/ML IJ SOLN: 0.5000 mg | INTRAMUSCULAR | Status: DC | PRN

## 2021-07-16 MED ORDER — FUROSEMIDE 10 MG/ML IJ SOLN
20.0000 mg | Freq: Once | INTRAMUSCULAR | Status: AC
  Administered 2021-07-16: 20 mg via INTRAVENOUS
  Filled 2021-07-16: qty 2

## 2021-07-16 MED ORDER — METOPROLOL TARTRATE 5 MG/5ML IV SOLN
INTRAVENOUS | Status: AC
  Filled 2021-07-16: qty 5

## 2021-07-16 MED ORDER — METOPROLOL TARTRATE 5 MG/5ML IV SOLN
5.0000 mg | INTRAVENOUS | Status: DC
  Administered 2021-07-16 (×3): 5 mg via INTRAVENOUS
  Filled 2021-07-16 (×2): qty 5

## 2021-07-16 MED ORDER — ALBUTEROL SULFATE (2.5 MG/3ML) 0.083% IN NEBU
2.5000 mg | INHALATION_SOLUTION | RESPIRATORY_TRACT | Status: DC | PRN
  Administered 2021-07-16: 2.5 mg via RESPIRATORY_TRACT
  Filled 2021-07-16: qty 3

## 2021-07-16 NOTE — Progress Notes (Signed)
Daily Progress Note   Patient Name: Unique Kaseman       Date: 07/16/2021 DOB: 1932-08-14  Age: 87 y.o. MRN#: [id number] Attending Physician: Mariel Aloe, MD Primary Care Physician: Derinda Late, MD Admit Date: 07/07/2021  Reason for Consultation/Follow-up: Establishing goals of care  Subjective: Awake resting in bed, speech is slurred, overnight events noted, received call from [facility], patient suffered an acute CVA last night.   Chart reviewed, CT scans noted. Patient seen and examined, call placed and discussed with HCPOA son Lake Bells. He states that the patient told him yesterday that she was ready to go on and die.     Length of Stay: 9  Current Medications: Scheduled Meds:   acetaminophen  1,000 mg Oral TID   Chlorhexidine Gluconate Cloth  6 each Topical Daily   diclofenac Sodium  2 g Topical QID   diphenoxylate-atropine  2 tablet Oral Q breakfast   lip balm  1 application Topical BID   mouth rinse  15 mL Mouth Rinse BID   metoprolol tartrate  5 mg Intravenous Q4H   nystatin  5 mL Oral QID   pantoprazole (PROTONIX) IV  40 mg Intravenous Q12H    Continuous Infusions:  sodium chloride Stopped (07/15/21 1425)   lactated ringers Stopped (07/15/21 1102)    PRN Meds: sodium chloride, albuterol, antiseptic oral rinse, diphenoxylate-atropine, hydrALAZINE, HYDROmorphone (DILAUDID) injection, magic mouthwash, ondansetron **OR** ondansetron (ZOFRAN) IV, phenol, witch hazel-glycerin  Physical Exam         Weak appearing elderly lady resting in bed Has edema Slurred speech Mild distress  Has mild generalized abdominal tenderness    Vital Signs: BP (!) 188/132   Pulse (!) 31   Temp (!) 97.3 F (36.3 C) (Axillary)   Resp 17   Ht 5\' 2"  (1.575 m)   Wt 62.5 kg   SpO2  97%   BMI 25.20 kg/m  SpO2: SpO2: 97 % O2 Device: O2 Device: Nasal Cannula O2 Flow Rate: O2 Flow Rate (L/min): 2 L/min  Intake/output summary:  Intake/Output Summary (Last 24 hours) at 07/16/2021 1012 Last data filed at 07/16/2021 0800 Gross per 24 hour  Intake 795.63 ml  Output 1925 ml  Net -1129.37 ml    LBM: Last BM Date: 07/15/21 Baseline Weight: Weight: 59.9 kg Most recent weight: Weight: 62.5 kg  Palliative performance scale 20%. Palliative Assessment/Data:      Patient Active Problem List   Diagnosis Date Noted   Acute intestinal ischemia s/p ileocolonic resection 07/09/2021 07/15/2021   Palliative care by specialist    Goals of care, counseling/discussion    General weakness    Pressure injury of skin 07/13/2021   Rectal bleeding 07/07/2021   Type 2 diabetes mellitus with hyperglycemia (HCC) 07/07/2021   Dilated gallbladder 07/07/2021   Acute lower UTI 07/07/2021   Elevated lipase 07/07/2021   Renal artery stenosis (HCC) 07/07/2021   HLD (hyperlipidemia)    Aortic atherosclerosis (HCC)    Status post kyphoplasty 06/05/2021   Hypothyroidism 05/07/2021   Type 2 diabetes mellitus without complications (HCC) 05/07/2021   Compression fracture of body of thoracic vertebra (HCC) 05/07/2021   Hypertensive urgency 05/06/2021   Compression fracture of T11 vertebra (HCC) 05/06/2021   History of stroke 12/06/2019   Atrial fibrillation, controlled (HCC) 12/06/2019   Atrial fibrillation (HCC)    Hypertension    Elevated troponin 11/26/2019   Closed Colles' fracture of left radius 10/03/2017   Labile blood pressure    Benign essential HTN    Hypertensive crisis    Hyponatremia    Prediabetes    Hypoalbuminemia due to protein-calorie malnutrition (HCC)    Acute blood loss anemia    Stroke (cerebrum) (HCC) 08/01/2017   Acute CVA (cerebrovascular accident) (HCC) 07/29/2017   Syncope, near 03/31/2016   Gastro-esophageal reflux disease without esophagitis  02/15/2014    Palliative Care Assessment & Plan   Patient Profile:    Assessment:  87 year old lady who came from [facility] facility, history of chronic atrial fibrillation, carotid artery disease, coronary artery disease GERD hiatal hernia hypertension dyslipidemia stroke depression diabetes.  Patient was sent because of rectal bleeding nausea vomiting and abdominal pain.  Initially was worked up for possible cholecystitis, progressed to acute abdomen and was seen emergently by surgery.  Patient underwent diagnostic laparoscopy which was converted to exploratory laparotomy.  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 Hospital course also complicated by postop ileus, oliguric acute kidney injury and ongoing acute metabolic encephalopathy. Acute stroke.  Palliative consultation for goals of care discussions has been requested.  Recommendations/Plan: Call placed and discussed with HCPOA son [name], he was at the patient's bedside when she had an acute stroke yesterday evening/night. We discussed CT results. We talked about goals of care. [name] states that the patient was very clear about not being dependent on tubes and machines at end of life. She has been voicing that she is ready to die and expressed this to both her sons on 07-15-21.  We talked about comfort care and residential hospice, choices discussed.  PLAN: Comfort measures PRN IV Dilaudid for pain/dyspnea Prognosis hours to days Recommend residential hospice.     Code Status:    Code Status Orders  (From admission, onward)           Start     Ordered   07/07/21 0734  Do not attempt resuscitation (DNR)  Continuous       Question Answer Comment  In the event of cardiac or respiratory ARREST Do not call a "code blue"   In the event of cardiac or respiratory ARREST Do not perform Intubation, CPR, defibrillation or ACLS   In the event of  cardiac or respiratory ARREST Use medication by any route, position, wound care, and other measures to relive pain and  suffering. May use oxygen, suction and manual treatment of airway obstruction as needed for comfort.   Comments Discussed CODE STATUS with patient's son who was at the bedside, Favor Massoth and she is a DNR.      07/07/21 0733           Code Status History     Date Active Date Inactive Code Status Order ID Comments User Context   06/05/2021 2017 06/06/2021 2125 DNR [id number]  Hessie Knows, MD Inpatient   05/06/2021 0851 05/09/2021 1936 DNR [id number]  Collier Bullock, MD ED   11/26/2019 2020 11/28/2019 2329 DNR [id number]  Anda Latina, MD ED   08/22/2018 0548 08/23/2018 1712 DNR [id number]  Arta Silence, MD Inpatient   08/01/2017 1711 08/13/2017 2110 DNR [id number]  Bary Leriche, PA-C Inpatient   08/01/2017 1710 08/01/2017 1711 DNR [id number]  Bary Leriche, PA-C Inpatient   07/29/2017 1820 08/01/2017 1637 DNR [id number]  Demetrios Loll, MD Inpatient   03/31/2016 1523 04/03/2016 1750 Full Code [id number]  Hower, Aaron Mose, MD ED      Advance Directive Documentation    Flowsheet Row Most Recent Value  Type of Advance Directive Healthcare Power of Attorney, Living will  Pre-existing out of facility DNR order (yellow form or pink MOST form) --  "MOST" Form in Place? --       Prognosis:  Hours to days  Discharge Planning: Residential hospice.   Care plan was discussed with patient, Dr Florence Canner and son.    Thank you for allowing the Palliative Medicine Team to assist in the care of this patient.   Time In: 9.30 Time Out: 10.05 Total Time  35 Prolonged Time Billed  no       Greater than 50%  of this time was spent counseling and coordinating care related to the above assessment and plan.  Loistine Chance, MD  Please contact Palliative Medicine Team phone at [phone] for questions and concerns from 7 AM-7 PM.  After 7 PM, contact primary service.

## 2021-07-16 NOTE — Discharge Summary (Signed)
Physician Discharge Summary  Debbie Hampton TKZ:[id number] DOB: [birth year] DOA: 07/07/2021  PCP: Kandyce Rud, MD  Admit date: 07/07/2021 Discharge date: 07/16/2021  Admitted From: Home Disposition: Hospice   Discharge Condition: Hospice CODE STATUS: DNR  Brief/Interim Summary:  Admission HPI written by Sanda Klein, MD   HPI: Debbie Hampton is a 87 y.o. female with medical history significant of aortic atherosclerosis, chronic atrial fibrillation on apixaban, bilateral carotid artery disease, coronary artery disease, mild MR and TR, history of syncope, history of other nonhemorrhagic CVA, history of renal artery stenosis, history of falls, GERD, hiatal hernia, colon polyps, constipation, type II DM, thyroid nodules, depression, osteoarthritis, hyperlipidemia who was sent from her skilled nursing facility due to rectal bleeding, nausea and vomiting since yesterday.  She is able to tell me that her symptoms started last night but was unable to elaborate.  She still has some abdominal pain, but denied headache, chest or back pain at this moment.   Hospital course:  Acute peritonitis Embolic mesenteric ischemia Patient presented with concern for acute cholecystitis. HIDA scan negative. Patient subsequently worsened with evident of acute abdomen with peritonitis. Patient underwent diagnostic laparoscopy converted to exploratory laparotomy with right colectomy/ileal resection for embolic mesenteric ischemia with primary anastomosis to proximal transverse colon.   Post-op ileus Persistent. No bowel movement yet. NG tube removed by patient. Transitioned to dysphagia 3 diet prior to stroke.  Acute right MCA stroke Patient suffered acute aphasia symptoms. CT perfusion scan significant for R. MCA territory stroke. Family decided to transition to comfort measures.   Sepsis Not present on admission. Secondary to mesenteric ischemia. IV fluids and antibiotics started. No blood cultures  available. Physiology resolved.   Rectal bleeding Likely related to hemorrhoidal bleeding. Resolved.    E. Coli acute cystitis Confirmed on urine culture. Patient started on Ciprofloxacin IV which was transitioned to Ceftriaxone IV. Completed 7 day course of antibiotics   Hyponatremia Mild. Likely related to dehydration. Resolved with IV fluids.   Dehydration Likely secondary to GI losses. Started on IV fluids.   Oliguric AKI Baseline creatinine of about 0.8-0.9. Creatinine of 1.08 on admission with peak of 1.64. Possibly related to ATN. Nephrology consulted with recommendations for increased IV fluids. Creatinine stabilized. UOP of 1615 mL over the last 24 hours. Resolved.   Acute metabolic encephalopathy Unknown baseline but concern this may be related to dehydration vs delirium. Mental status appears to be improved prior to stroke.   Chronic atrial fibrillation Atenolol held secondary to NPO status. Started on metoprolol IV. Heparin IV restarted in place of low-dose Eliquis. Heparin IV held secondary to concern for coffee ground emesis. Discussed with cardiology who recommended increase to q4 hour metoprolol IV while unable to take PO consistently.   Possible coffee ground emesis/upper GI bleed Episode morning of 10/29 while on Heparin IV. Protonix IV started. Heparin IV held. No recurrent episode. Hemoglobin stable. Unsure if this was true hematemesis.    Acute anemia Thought to be secondary to acute illness/post-op blood loss and hemodilution. Does not appear to have acute bleeding. CT angio obtained on 10/22 without source for bleed.   Leukocytosis Secondary to mesenteric ischemia. Peak WBC of 21,600 and is now resolved.    Primary hypertension History of left renal artery stenosis on atenolol and lisinopril. Started on metoprolol while NPO.   History of T11 compression fracture Stable.   Hypothyroidism No outpatient medications noted. Unsure of diagnosis. Cannot confirm  at this time. TSH normal.   Diabetes mellitus, type  2 Controlled. Hemoglobin A1C of 5.6%.   Hyperlipidemia Aortic atherosclerosis Patient is on Crestor as an outpatient.   Hearing loss Noted.   Moderate sliding hiatal hernia GERD Noted.   Bilateral pleural effusions Noted on CT scan. Moderate R > L. Layering.   Goals of care Per general surgery discussion with family, Palliative Care consult placed. With regard to nutrition, desire to not place feeding tube or start TPN.   Discharge Diagnoses:  Principal Problem:   Acute intestinal ischemia s/p ileocolonic resection 07/09/2021 Active Problems:   Hyponatremia   Atrial fibrillation (HCC)   Hypertension   Compression fracture of T11 vertebra (HCC)   Hypothyroidism   Rectal bleeding   Type 2 diabetes mellitus with hyperglycemia (HCC)   Dilated gallbladder   Acute lower UTI   Elevated lipase   Renal artery stenosis (HCC)   HLD (hyperlipidemia)   Aortic atherosclerosis (HCC)   Pressure injury of skin   Palliative care by specialist   Goals of care, counseling/discussion   General weakness    Discharge Instructions  Discharge Instructions     No wound care   Complete by: As directed       Allergies as of 07/16/2021       Reactions   Cheese    GOAT cheese   Mushroom Extract Complex    Penicillins    Has patient had a PCN reaction causing immediate rash, facial/tongue/throat swelling, SOB or lightheadedness with hypotension: Yes Has patient had a PCN reaction causing severe rash involving mucus membranes or skin necrosis: No Has patient had a PCN reaction that required hospitalization No Has patient had a PCN reaction occurring within the last 10 years: Yes If all of the above answers are "NO", then may proceed with Cephalosporin use.   Verapamil    Aloe Hives, Rash   Amlodipine Hives, Rash, Hypertension   Cephalexin Rash   Tolerated Ancef 09 of 2022   Metoprolol Hives, Palpitations, Hypertension    Succinate   Miconazole Swelling, Rash   Neosporin [neomycin-bacitracin Zn-polymyx] Rash   Trandolapril-verapamil Hcl Er Rash        Medication List     STOP taking these medications    atenolol 50 MG tablet Commonly known as: TENORMIN   calcium carbonate 750 MG chewable tablet Commonly known as: TUMS EX   CertaVite/Antioxidants Tabs   cholecalciferol 25 MCG (1000 UNIT) tablet Commonly known as: VITAMIN D3   citalopram 20 MG tablet Commonly known as: CELEXA   diclofenac Sodium 1 % Gel Commonly known as: VOLTAREN   Esomeprazole Magnesium 20 MG Tbec   Fluocinolone Acetonide 0.01 % Oil   fluticasone 50 MCG/ACT nasal spray Commonly known as: FLONASE   furosemide 20 MG tablet Commonly known as: LASIX   guaiFENesin 600 MG 12 hr tablet Commonly known as: MUCINEX   HYDROcodone-acetaminophen 5-325 MG tablet Commonly known as: NORCO/VICODIN   iron polysaccharides 150 MG capsule Commonly known as: NIFEREX   lidocaine 5 % Commonly known as: LIDODERM   lisinopril 10 MG tablet Commonly known as: ZESTRIL   liver oil-zinc oxide 40 % ointment Commonly known as: DESITIN   omega-3 acid ethyl esters 1 g capsule Commonly known as: LOVAZA   polyethylene glycol 17 g packet Commonly known as: MIRALAX / GLYCOLAX   rosuvastatin 20 MG tablet Commonly known as: CRESTOR   sodium chloride 0.65 % Soln nasal spray Commonly known as: OCEAN   tiZANidine 2 MG tablet Commonly known as: ZANAFLEX  Allergies  Allergen Reactions   Cheese     GOAT cheese   Mushroom Extract Complex    Penicillins     Has patient had a PCN reaction causing immediate rash, facial/tongue/throat swelling, SOB or lightheadedness with hypotension: Yes Has patient had a PCN reaction causing severe rash involving mucus membranes or skin necrosis: No Has patient had a PCN reaction that required hospitalization No Has patient had a PCN reaction occurring within the last 10 years: Yes If all of  the above answers are "NO", then may proceed with Cephalosporin use.    Verapamil    Aloe Hives and Rash   Amlodipine Hives, Rash and Hypertension   Cephalexin Rash    Tolerated Ancef 09 of 2022   Metoprolol Hives, Palpitations and Hypertension    Succinate   Miconazole Swelling and Rash   Neosporin [Neomycin-Bacitracin Zn-Polymyx] Rash   Trandolapril-Verapamil Hcl Er Rash    Consultations: General surgery [facility] Nephrology Palliative care medicine   Procedures/Studies: US RENAL  Result Date: 07/10/2021 CLINICAL DATA:  Acute kidney injury EXAM: RENAL / URINARY TRACT ULTRASOUND COMPLETE COMPARISON:  CT 07/07/2021 FINDINGS: Right Kidney: Renal measurements: 8.9 x 4 x 5 cm = volume: 90.2 mL. Cortex is slightly echogenic. No mass. Mild right renal pelvis dilatation without hydronephrosis Left Kidney: Renal measurements: 8.1 x 3.8 x 4.5 cm = volume: 72.1 mL. Cortex slightly echogenic. No mass or hydronephrosis. Bladder: Decompressed by Foley catheter Other: None. IMPRESSION: Cortex appears slightly echogenic consistent with medical renal disease. No convincing hydronephrosis. Electronically Signed   By: Donavan Foil M.D.   On: 07/10/2021 20:03   NM Hepato W/EF  Result Date: 07/09/2021 CLINICAL DATA:  Possible cholecystitis. EXAM: NUCLEAR MEDICINE HEPATOBILIARY IMAGING WITH GALLBLADDER EF TECHNIQUE: Sequential images of the abdomen were obtained out to 60 minutes following intravenous administration of radiopharmaceutical. After oral ingestion of Ensure, gallbladder ejection fraction was determined. At 60 min, normal ejection fraction is greater than 33%. RADIOPHARMACEUTICALS:  5.4 mCi Tc-80m  Choletec IV COMPARISON:  Right upper quadrant ultrasound and CT abdomen pelvis dated July 07, 2021. FINDINGS: Prompt uptake and biliary excretion of activity by the liver is seen. Gallbladder activity is visualized, consistent with patency of cystic duct. Biliary activity passes into  small bowel, consistent with patent common bile duct. Calculated gallbladder ejection fraction is 52%. (Normal gallbladder ejection fraction with Ensure is greater than 33%.) IMPRESSION: 1. Normal HIDA scan and gallbladder ejection fraction. Electronically Signed   By: Titus Dubin M.D.   On: 07/09/2021 11:32   CT CEREBRAL PERFUSION W CONTRAST  Result Date: 07/15/2021 CLINICAL DATA:  Initial evaluation for acute stroke. EXAM: CT ANGIOGRAPHY HEAD AND NECK CT PERFUSION BRAIN TECHNIQUE: Multidetector CT imaging of the head and neck was performed using the standard protocol during bolus administration of intravenous contrast. Multiplanar CT image reconstructions and MIPs were obtained to evaluate the vascular anatomy. Carotid stenosis measurements (when applicable) are obtained utilizing NASCET criteria, using the distal internal carotid diameter as the denominator. Multiphase CT imaging of the brain was performed following IV bolus contrast injection. Subsequent parametric perfusion maps were calculated using RAPID software. CONTRAST:  175mL OMNIPAQUE IOHEXOL 350 MG/ML SOLN COMPARISON:  Head CT from earlier the same day. FINDINGS: CTA NECK FINDINGS Aortic arch: Visualized aortic arch normal caliber with normal branch pattern. Moderate atheromatous change about the arch itself and origin of the great vessels without hemodynamically significant stenosis. Right carotid system: Right common carotid artery tortuous but widely patent to the bifurcation without  stenosis. Mixed concentric plaque about the origin of the cervical right ICA without hemodynamically significant stenosis. Right ICA tortuous but widely patent distally to the skull base without stenosis, dissection or occlusion. Left carotid system: Left CCA tortuous widely patent to the bifurcation without stenosis. Calcified plaque about the left carotid bulb/proximal left ICA without hemodynamically significant stenosis. Left ICA tortuous but widely patent  distally without stenosis, dissection or occlusion. Vertebral arteries: Both vertebral arteries arise from subclavian arteries. No proximal subclavian artery stenosis. Left vertebral artery strongly dominant, with a diffusely hypoplastic right vertebral artery. Vertebral arteries tortuous but are widely patent without stenosis, dissection or occlusion. Skeleton: No discrete or worrisome osseous lesions. Diffuse osteopenia noted. Mild compression deformity involving the T5 vertebral body, somewhat age indeterminate, but favored to be chronic (series 7, image 93). Other neck: No other visible acute abnormality within the neck. Upper chest: Large bilateral pleural effusions partially visualize, partially loculated on the right. 6 mm right upper lobe nodule (series 3, image 15), indeterminate. Review of the MIP images confirms the above findings CTA HEAD FINDINGS Anterior circulation: Petrous segments patent bilaterally. Atheromatous change within the carotid siphons with no more than mild stenosis, slightly worse on the right. A1 segments patent bilaterally. Normal anterior communicating artery complex. Anterior cerebral arteries patent to their distal aspects without stenosis. M1 segments patent bilaterally. Normal MCA bifurcations. Distal left MCA branches perfused. On the right, there appears to be a proximal right M2 branch occlusion (series 5, image 91). Remainder of the right MCA branches perfused. Posterior circulation: Hypoplastic right vertebral artery largely terminates in PICA. Atheromatous change within the dominant left V4 segment with associated mild to moderate stenosis (series 5, image 148). Both PICA patent. Basilar patent to its distal aspect without stenosis. Superior cerebral arteries patent bilaterally. Right PCA supplied via the basilar. Fetal type origin of the left PCA. PCAs irregular but remain patent to their distal aspects without stenosis. Venous sinuses: Patent allowing for timing the  contrast bolus. Anatomic variants: Hypoplastic right vertebral artery terminates in PICA. Fetal type origin of the left PCA. Review of the MIP images confirms the above findings CT Brain Perfusion Findings: ASPECTS: 8 CBF (<30%) Volume: 15mL Perfusion (Tmax>6.0s) volume: 103mL Mismatch Volume: 4mL Infarction Location:Acute right MCA distribution infarct involving the right insula and overlying mid and posterior right frontal region. 14 cc surrounding ischemic penumbra. Finding corresponds with abnormality on prior CT. IMPRESSION: 1. Acute proximal right M2 branch occlusion as above. 2. Positive CT perfusion with acute right MCA distribution infarct, estimated volume 31 mL. 14 cc surrounding ischemic penumbra. 3. Moderate atherosclerotic change throughout the major arterial vasculature of the head and neck, with no other hemodynamically significant or correctable stenosis. 4. Large bilateral pleural effusions, partially loculated on the right. 5. 6 mm right upper lobe nodule, indeterminate. Non-contrast chest CT at 6-12 months is recommended. If the nodule is stable at time of repeat CT, then future CT at 18-24 months (from today's scan) is considered optional for low-risk patients, but is recommended for high-risk patients. This recommendation follows the consensus statement: Guidelines for Management of Incidental Pulmonary Nodules Detected on CT Images: From the Fleischner Society 2017; Radiology 2017; 284:228-243. 6. Mild compression deformity involving the T5 vertebral body, somewhat age indeterminate, but favored to be chronic. Correlation with physical exam recommended. Critical Value/emergent results were called by telephone at the time of interpretation on 07/15/2021 at 10:24 pm to provider Dr. Tonie Griffith, who verbally acknowledged these results. Electronically Signed   By: Marland Kitchen  Jeannine Boga M.D.   On: 07/15/2021 22:32   DG CHEST PORT 1 VIEW  Result Date: 07/15/2021 CLINICAL DATA:  Code stroke.   Shortness of breath. EXAM: PORTABLE CHEST 1 VIEW COMPARISON:  Abdominal CT scan 07/07/2021 FINDINGS: The heart is normal in size. Moderate tortuosity and calcification of the thoracic aorta. Persistent bilateral pleural effusions, right larger than left with overlying atelectasis. Stable large hiatal hernia. IMPRESSION: Persistent bilateral pleural effusions and overlying atelectasis, right greater than left. Electronically Signed   By: Marijo Sanes M.D.   On: 07/15/2021 20:25   DG Abd Portable 1V  Result Date: 07/13/2021 CLINICAL DATA:  Postsurgical vomiting EXAM: PORTABLE ABDOMEN - 1 VIEW COMPARISON:  07/09/2021 FINDINGS: A paucity of intra-abdominal gas precludes evaluation of the abdominal gas pattern. There is no gross free intraperitoneal gas. Laparotomy skin staples are seen within the midline. A small amount of gas is seen within a nondistended distal colon. Underpenetration precludes evaluation of the visceral shadows. Degenerative changes are seen within the lumbar spine. Prior T12 vertebroplasty noted. IMPRESSION: Indeterminate bowel-gas pattern due to a paucity of intra-abdominal gas. No gross free air. Electronically Signed   By: Fidela Salisbury M.D.   On: 07/13/2021 19:41   DG Abd Portable 1V  Result Date: 07/09/2021 CLINICAL DATA:  Encounter for NG tube placement EXAM: PORTABLE ABDOMEN - 1 VIEW COMPARISON:  None. FINDINGS: NG tube extends into the proximal stomach. This portion of stomach is above the diaphragm consistent with hiatal hernia as seen on CT 07/07/2021. Mildly dilated loops central small bowel measure up to 3.5 cm. Gas the rectum. IMPRESSION: 1. NG tube within proximal stomach within a hiatal hernia. 2. Mildly dilated loops of central small bowel. Gas within the colon or rectum. Electronically Signed   By: Suzy Bouchard M.D.   On: 07/09/2021 15:47   DG Abd Portable 1V  Result Date: 07/09/2021 CLINICAL DATA:  Abdominal pain and tenderness EXAM: PORTABLE ABDOMEN - 1 VIEW  COMPARISON:  None. FINDINGS: Mildly dilated gas-filled loops of small bowel. Air seen in the rectum. No supine evidence of free air. Partially visualized lungs demonstrate and moderate hiatal hernia bibasilar atelectasis. No acute osseous abnormality. IMPRESSION: Mildly dilated gas-filled loops of small bowel with air seen in the rectum, findings can be seen in the setting of early or partial small bowel obstruction. Electronically Signed   By: Yetta Glassman M.D.   On: 07/09/2021 15:14   CT HEAD CODE STROKE WO CONTRAST  Result Date: 07/15/2021 CLINICAL DATA:  Code stroke. Initial evaluation for neuro deficit, stroke suspected. EXAM: CT HEAD WITHOUT CONTRAST TECHNIQUE: Contiguous axial images were obtained from the base of the skull through the vertex without intravenous contrast. COMPARISON:  Prior CT from 05/07/2021. FINDINGS: Brain: Generalized age-related cerebral atrophy. Patchy and confluent T2/FLAIR hypodensity about the periventricular and deep white matter both cerebral hemispheres most consistent with chronic small vessel ischemic disease, moderately advanced in nature. Few scatter remote lacunar infarcts present about the bilateral basal ganglia and corona radiata. Subtle loss of gray-white matter differentiation seen involving the right insula and overlying right frontal lobe, concerning for acute right MCA distribution infarct. No intracranial hemorrhage. No mass or midline shift. No hydrocephalus or extra-axial fluid collection. Vascular: No visible hyperdense vessel. Prominent calcified atherosclerosis present at the skull base. Skull: Scalp soft tissues and calvarium within normal limits. Sinuses/Orbits: Globes and orbital soft tissues demonstrate no acute finding. Chronic left maxillary sinusitis. Paranasal sinuses are otherwise clear. No mastoid effusion. Other: None. ASPECTS [facility] Stroke  Program Early CT Score) - Ganglionic level infarction (caudate, lentiform nuclei, internal capsule,  insula, M1-M3 cortex): 6 - Supraganglionic infarction (M4-M6 cortex): 2 Total score (0-10 with 10 being normal): 8 IMPRESSION: 1. Subtle loss of gray-white matter differentiation involving the right frontal lobe and insula, concerning for evolving acute right MCA distribution infarct. No hemorrhage. 2. ASPECTS is 8. 3. Underlying age-related cerebral atrophy with moderately advanced chronic microvascular ischemic disease. Critical Value/emergent results were called by telephone at the time of interpretation on 07/15/2021 at 8:47 pm to provider [facility] , who verbally acknowledged these results. Electronically Signed   By: Jeannine Boga M.D.   On: 07/15/2021 20:52   CT ANGIO HEAD CODE STROKE  Result Date: 07/15/2021 CLINICAL DATA:  Initial evaluation for acute stroke. EXAM: CT ANGIOGRAPHY HEAD AND NECK CT PERFUSION BRAIN TECHNIQUE: Multidetector CT imaging of the head and neck was performed using the standard protocol during bolus administration of intravenous contrast. Multiplanar CT image reconstructions and MIPs were obtained to evaluate the vascular anatomy. Carotid stenosis measurements (when applicable) are obtained utilizing NASCET criteria, using the distal internal carotid diameter as the denominator. Multiphase CT imaging of the brain was performed following IV bolus contrast injection. Subsequent parametric perfusion maps were calculated using RAPID software. CONTRAST:  123mL OMNIPAQUE IOHEXOL 350 MG/ML SOLN COMPARISON:  Head CT from earlier the same day. FINDINGS: CTA NECK FINDINGS Aortic arch: Visualized aortic arch normal caliber with normal branch pattern. Moderate atheromatous change about the arch itself and origin of the great vessels without hemodynamically significant stenosis. Right carotid system: Right common carotid artery tortuous but widely patent to the bifurcation without stenosis. Mixed concentric plaque about the origin of the cervical right ICA without  hemodynamically significant stenosis. Right ICA tortuous but widely patent distally to the skull base without stenosis, dissection or occlusion. Left carotid system: Left CCA tortuous widely patent to the bifurcation without stenosis. Calcified plaque about the left carotid bulb/proximal left ICA without hemodynamically significant stenosis. Left ICA tortuous but widely patent distally without stenosis, dissection or occlusion. Vertebral arteries: Both vertebral arteries arise from subclavian arteries. No proximal subclavian artery stenosis. Left vertebral artery strongly dominant, with a diffusely hypoplastic right vertebral artery. Vertebral arteries tortuous but are widely patent without stenosis, dissection or occlusion. Skeleton: No discrete or worrisome osseous lesions. Diffuse osteopenia noted. Mild compression deformity involving the T5 vertebral body, somewhat age indeterminate, but favored to be chronic (series 7, image 93). Other neck: No other visible acute abnormality within the neck. Upper chest: Large bilateral pleural effusions partially visualize, partially loculated on the right. 6 mm right upper lobe nodule (series 3, image 15), indeterminate. Review of the MIP images confirms the above findings CTA HEAD FINDINGS Anterior circulation: Petrous segments patent bilaterally. Atheromatous change within the carotid siphons with no more than mild stenosis, slightly worse on the right. A1 segments patent bilaterally. Normal anterior communicating artery complex. Anterior cerebral arteries patent to their distal aspects without stenosis. M1 segments patent bilaterally. Normal MCA bifurcations. Distal left MCA branches perfused. On the right, there appears to be a proximal right M2 branch occlusion (series 5, image 91). Remainder of the right MCA branches perfused. Posterior circulation: Hypoplastic right vertebral artery largely terminates in PICA. Atheromatous change within the dominant left V4 segment  with associated mild to moderate stenosis (series 5, image 148). Both PICA patent. Basilar patent to its distal aspect without stenosis. Superior cerebral arteries patent bilaterally. Right PCA supplied via the basilar. Fetal type origin of the  left PCA. PCAs irregular but remain patent to their distal aspects without stenosis. Venous sinuses: Patent allowing for timing the contrast bolus. Anatomic variants: Hypoplastic right vertebral artery terminates in PICA. Fetal type origin of the left PCA. Review of the MIP images confirms the above findings CT Brain Perfusion Findings: ASPECTS: 8 CBF (<30%) Volume: 34mL Perfusion (Tmax>6.0s) volume: 86mL Mismatch Volume: 59mL Infarction Location:Acute right MCA distribution infarct involving the right insula and overlying mid and posterior right frontal region. 14 cc surrounding ischemic penumbra. Finding corresponds with abnormality on prior CT. IMPRESSION: 1. Acute proximal right M2 branch occlusion as above. 2. Positive CT perfusion with acute right MCA distribution infarct, estimated volume 31 mL. 14 cc surrounding ischemic penumbra. 3. Moderate atherosclerotic change throughout the major arterial vasculature of the head and neck, with no other hemodynamically significant or correctable stenosis. 4. Large bilateral pleural effusions, partially loculated on the right. 5. 6 mm right upper lobe nodule, indeterminate. Non-contrast chest CT at 6-12 months is recommended. If the nodule is stable at time of repeat CT, then future CT at 18-24 months (from today's scan) is considered optional for low-risk patients, but is recommended for high-risk patients. This recommendation follows the consensus statement: Guidelines for Management of Incidental Pulmonary Nodules Detected on CT Images: From the Fleischner Society 2017; Radiology 2017; 284:228-243. 6. Mild compression deformity involving the T5 vertebral body, somewhat age indeterminate, but favored to be chronic. Correlation  with physical exam recommended. Critical Value/emergent results were called by telephone at the time of interpretation on 07/15/2021 at 10:24 pm to provider Dr. Tonie Griffith, who verbally acknowledged these results. Electronically Signed   By: Jeannine Boga M.D.   On: 07/15/2021 22:32   CT ANGIO NECK CODE STROKE  Result Date: 07/15/2021 CLINICAL DATA:  Initial evaluation for acute stroke. EXAM: CT ANGIOGRAPHY HEAD AND NECK CT PERFUSION BRAIN TECHNIQUE: Multidetector CT imaging of the head and neck was performed using the standard protocol during bolus administration of intravenous contrast. Multiplanar CT image reconstructions and MIPs were obtained to evaluate the vascular anatomy. Carotid stenosis measurements (when applicable) are obtained utilizing NASCET criteria, using the distal internal carotid diameter as the denominator. Multiphase CT imaging of the brain was performed following IV bolus contrast injection. Subsequent parametric perfusion maps were calculated using RAPID software. CONTRAST:  19mL OMNIPAQUE IOHEXOL 350 MG/ML SOLN COMPARISON:  Head CT from earlier the same day. FINDINGS: CTA NECK FINDINGS Aortic arch: Visualized aortic arch normal caliber with normal branch pattern. Moderate atheromatous change about the arch itself and origin of the great vessels without hemodynamically significant stenosis. Right carotid system: Right common carotid artery tortuous but widely patent to the bifurcation without stenosis. Mixed concentric plaque about the origin of the cervical right ICA without hemodynamically significant stenosis. Right ICA tortuous but widely patent distally to the skull base without stenosis, dissection or occlusion. Left carotid system: Left CCA tortuous widely patent to the bifurcation without stenosis. Calcified plaque about the left carotid bulb/proximal left ICA without hemodynamically significant stenosis. Left ICA tortuous but widely patent distally without stenosis,  dissection or occlusion. Vertebral arteries: Both vertebral arteries arise from subclavian arteries. No proximal subclavian artery stenosis. Left vertebral artery strongly dominant, with a diffusely hypoplastic right vertebral artery. Vertebral arteries tortuous but are widely patent without stenosis, dissection or occlusion. Skeleton: No discrete or worrisome osseous lesions. Diffuse osteopenia noted. Mild compression deformity involving the T5 vertebral body, somewhat age indeterminate, but favored to be chronic (series 7, image 93). Other neck: No other  visible acute abnormality within the neck. Upper chest: Large bilateral pleural effusions partially visualize, partially loculated on the right. 6 mm right upper lobe nodule (series 3, image 15), indeterminate. Review of the MIP images confirms the above findings CTA HEAD FINDINGS Anterior circulation: Petrous segments patent bilaterally. Atheromatous change within the carotid siphons with no more than mild stenosis, slightly worse on the right. A1 segments patent bilaterally. Normal anterior communicating artery complex. Anterior cerebral arteries patent to their distal aspects without stenosis. M1 segments patent bilaterally. Normal MCA bifurcations. Distal left MCA branches perfused. On the right, there appears to be a proximal right M2 branch occlusion (series 5, image 91). Remainder of the right MCA branches perfused. Posterior circulation: Hypoplastic right vertebral artery largely terminates in PICA. Atheromatous change within the dominant left V4 segment with associated mild to moderate stenosis (series 5, image 148). Both PICA patent. Basilar patent to its distal aspect without stenosis. Superior cerebral arteries patent bilaterally. Right PCA supplied via the basilar. Fetal type origin of the left PCA. PCAs irregular but remain patent to their distal aspects without stenosis. Venous sinuses: Patent allowing for timing the contrast bolus. Anatomic  variants: Hypoplastic right vertebral artery terminates in PICA. Fetal type origin of the left PCA. Review of the MIP images confirms the above findings CT Brain Perfusion Findings: ASPECTS: 8 CBF (<30%) Volume: 65mL Perfusion (Tmax>6.0s) volume: 37mL Mismatch Volume: 67mL Infarction Location:Acute right MCA distribution infarct involving the right insula and overlying mid and posterior right frontal region. 14 cc surrounding ischemic penumbra. Finding corresponds with abnormality on prior CT. IMPRESSION: 1. Acute proximal right M2 branch occlusion as above. 2. Positive CT perfusion with acute right MCA distribution infarct, estimated volume 31 mL. 14 cc surrounding ischemic penumbra. 3. Moderate atherosclerotic change throughout the major arterial vasculature of the head and neck, with no other hemodynamically significant or correctable stenosis. 4. Large bilateral pleural effusions, partially loculated on the right. 5. 6 mm right upper lobe nodule, indeterminate. Non-contrast chest CT at 6-12 months is recommended. If the nodule is stable at time of repeat CT, then future CT at 18-24 months (from today's scan) is considered optional for low-risk patients, but is recommended for high-risk patients. This recommendation follows the consensus statement: Guidelines for Management of Incidental Pulmonary Nodules Detected on CT Images: From the Fleischner Society 2017; Radiology 2017; 284:228-243. 6. Mild compression deformity involving the T5 vertebral body, somewhat age indeterminate, but favored to be chronic. Correlation with physical exam recommended. Critical Value/emergent results were called by telephone at the time of interpretation on 07/15/2021 at 10:24 pm to provider Dr. Tonie Griffith, who verbally acknowledged these results. Electronically Signed   By: Jeannine Boga M.D.   On: 07/15/2021 22:32   CT ANGIO GI BLEED  Result Date: 07/07/2021 CLINICAL DATA:  87 year old female with bright red blood per  rectum. EXAM: CTA ABDOMEN AND PELVIS WITHOUT AND WITH CONTRAST TECHNIQUE: Multidetector CT imaging of the abdomen and pelvis was performed using the standard protocol during bolus administration of intravenous contrast. Multiplanar reconstructed images and MIPs were obtained and reviewed to evaluate the vascular anatomy. CONTRAST:  13mL OMNIPAQUE IOHEXOL 350 MG/ML SOLN COMPARISON:  CT scan of the chest 05/07/2021; MRI of the thoracic spine 05/29/2021; CTA abdomen 10/18/2010 FINDINGS: VASCULAR Aorta: Calcified atherosclerotic plaque throughout the abdominal aorta without evidence of aneurysm or dissection. Celiac: Patent without evidence of aneurysm, dissection, vasculitis or significant stenosis. SMA: Patent without evidence of aneurysm, dissection, vasculitis or significant stenosis. Renals: Solitary right renal artery. Mild  atherosclerotic plaque without significant appearing stenosis. On the left, there is a small accessory artery to the lower pole of the left kidney. The dominant renal artery demonstrates mixed fibrofatty and calcified atherosclerotic plaque resulting in mild to moderate stenosis. IMA: Patent without evidence of aneurysm, dissection, vasculitis or significant stenosis. Inflow: Patent without evidence of aneurysm, dissection, vasculitis or significant stenosis. Proximal Outflow: Bilateral common femoral and visualized portions of the superficial and profunda femoral arteries are patent without evidence of aneurysm, dissection, vasculitis or significant stenosis. Veins: No focal venous abnormality. Review of the MIP images confirms the above findings. NON-VASCULAR Lower chest: Moderate right and small left layering pleural effusions with associated bilateral lower lobe atelectasis. There is a large sliding hiatal hernia. Cardiomegaly with biatrial enlargement. Calcifications visualized along the coronary arteries. No pericardial effusion. Hepatobiliary: Normal hepatic contour and morphology. No  discrete hepatic lesion. No intra or extrahepatic biliary ductal dilatation. The gallbladder is abnormal in appearance being slightly distended and with diffuse mild gallbladder wall thickening, particularly around the neck and cystic duct. No radiopaque stones identified by CT. Pancreas: Unremarkable. No pancreatic ductal dilatation or surrounding inflammatory changes. Spleen: Normal in size without focal abnormality. Adrenals/Urinary Tract: No evidence of hydronephrosis. Punctate stones present within the bilateral upper and lower pole collecting systems. No evidence of stone along the ureters or within the bladder. No abnormal enhancing renal mass. Stomach/Bowel: No evidence of active arterial phase bleeding within the gastrointestinal tract at this time. No focal bowel wall thickening or evidence of obstruction. There are a few diverticula along the sigmoid colon without evidence of active inflammation. Normal appendix. Lymphatic: No suspicious lymphadenopathy. Reproductive: Uterus and bilateral adnexa are unremarkable for age. Other: No abdominal wall hernia or abnormality. No abdominopelvic ascites. Musculoskeletal: T11 compression fracture status post cement augmentation. Multilevel degenerative changes. No acute fracture or malalignment. IMPRESSION: VASCULAR 1. No evidence of active GI bleeding at this time. 2.  Aortic Atherosclerosis (ICD10-170.0). 3. Mild to moderate stenosis of the origin of the main left renal artery. NON-VASCULAR 1. Abnormal appearance of the gallbladder which is distended and diffusely edematous, particularly around the neck and cystic duct. In the appropriate clinical setting, these findings could be consistent with acute cholecystitis. Consider further evaluation with right upper quadrant ultrasound. 2. Moderately large sliding hiatal hernia. 3. Cardiomegaly with biatrial enlargement. 4. Moderate right and small left layering pleural effusions. 5. Mild sigmoid colonic diverticulosis  without evidence of active inflammation. 6. T11 compression fracture status post cement augmentation. Electronically Signed   By: Jacqulynn Cadet M.D.   On: 07/07/2021 06:50   US Abdomen Limited RUQ (LIVER/GB)  Result Date: 07/07/2021 CLINICAL DATA:  An 87 year old female presents with gallbladder distension and RIGHT upper quadrant pain. EXAM: ULTRASOUND ABDOMEN LIMITED RIGHT UPPER QUADRANT COMPARISON:  CT angiography of July 07, 2021. FINDINGS: Gallbladder: The gallbladder is distended. Wall is thickened to 5 mm. No reported tenderness over the gallbladder during the examination by the sonographer. Small amount of pericholecystic fluid. No gallstones are visualized. Adjacent bowel appears thickened by ultrasound collapsed on previous CT. Common bile duct: Diameter: 3 mm Liver: Limited assessment due to patient motion and breathing in general with no focal abnormality exhibited in the liver, mild to moderate parenchymal heterogeneity. Portal vein is patent on color Doppler imaging with normal direction of blood flow towards the liver. Other: None. IMPRESSION: Findings that raise the question of acalculus cholecystitis. No reported tenderness is noted over the gallbladder. Would suggest HIDA scan for added specificity. Constellation of  findings could also be seen in the setting of volume overload or heart failure or ongoing hepatitis. Question of bowel thickening adjacent to the gallbladder in this patient with reported GI bleeding. Correlate with signs of colitis. Electronically Signed   By: Zetta Bills M.D.   On: 07/07/2021 08:42      Subjective: Aphasia  Discharge Exam: Vitals:   07/16/21 1000 07/16/21 1100  BP: 136/65   Pulse: (!) 45 (!) 52  Resp: 18 20  Temp:    SpO2: 99% 99%   Vitals:   07/16/21 0800 07/16/21 0900 07/16/21 1000 07/16/21 1100  BP: 128/69 (!) 188/132 136/65   Pulse:   (!) 45 (!) 52  Resp: 14 17 18 20   Temp:      TempSrc:      SpO2: 97%  99% 99%  Weight:       Height:        General: Pt is alert, awake, not in acute distress    The results of significant diagnostics from this hospitalization (including imaging, microbiology, ancillary and laboratory) are listed below for reference.     Microbiology: Recent Results (from the past 240 hour(s))  Urine Culture     Status: Abnormal   Collection Time: 07/07/21  2:50 AM   Specimen: Urine, Clean Catch  Result Value Ref Range Status   Specimen Description   Final    URINE, CLEAN CATCH Performed at [facility], [place] [address], [place], [place] [address]    Special Requests   Final    NONE Performed at [facility], [place] [address], [place], [place] [address]    Culture >=100,000 COLONIES/mL ESCHERICHIA COLI (A)  Final   Report Status 07/09/2021 FINAL  Final   Organism ID, Bacteria ESCHERICHIA COLI (A)  Final      Susceptibility   Escherichia coli - MIC*    AMPICILLIN >=32 RESISTANT Resistant     CEFAZOLIN <=4 SENSITIVE Sensitive     CEFEPIME <=0.12 SENSITIVE Sensitive     CEFTRIAXONE <=0.25 SENSITIVE Sensitive     CIPROFLOXACIN <=0.25 SENSITIVE Sensitive     GENTAMICIN <=1 SENSITIVE Sensitive     IMIPENEM <=0.25 SENSITIVE Sensitive     NITROFURANTOIN <=16 SENSITIVE Sensitive     TRIMETH/SULFA >=320 RESISTANT Resistant     AMPICILLIN/SULBACTAM 16 INTERMEDIATE Intermediate     PIP/TAZO <=4 SENSITIVE Sensitive     * >=100,000 COLONIES/mL ESCHERICHIA COLI  Resp Panel by RT-PCR (Flu A&B, Covid) Nasopharyngeal Swab     Status: None   Collection Time: 07/07/21  7:42 AM   Specimen: Nasopharyngeal Swab; Nasopharyngeal(NP) swabs in vial transport medium  Result Value Ref Range Status   SARS Coronavirus 2 by RT PCR NEGATIVE NEGATIVE Final    Comment: (NOTE) SARS-CoV-2 target nucleic acids are NOT DETECTED.  The SARS-CoV-2 RNA is generally detectable in upper respiratory specimens during the acute phase of infection. The lowest concentration of  SARS-CoV-2 viral copies this assay can detect is 138 copies/mL. A negative result does not preclude SARS-Cov-2 infection and should not be used as the sole basis for treatment or other patient management decisions. A negative result may occur with  improper specimen collection/handling, submission of specimen other than nasopharyngeal swab, presence of viral mutation(s) within the areas targeted by this assay, and inadequate number of viral copies(<138 copies/mL). A negative result must be combined with clinical observations, patient history, and epidemiological information. The expected result is Negative.  Fact Sheet for Patients:  EntrepreneurPulse.com.au  Fact Sheet for Healthcare Providers:  IncredibleEmployment.be  This test is no t yet approved or cleared by the [place] FDA and  has been authorized for detection and/or diagnosis of SARS-CoV-2 by FDA under an Emergency Use Authorization (EUA). This EUA will remain  in effect (meaning this test can be used) for the duration of the COVID-19 declaration under Section 564(b)(1) of the Act, 21 U.S.C.section 360bbb-3(b)(1), unless the authorization is terminated  or revoked sooner.       Influenza A by PCR NEGATIVE NEGATIVE Final   Influenza B by PCR NEGATIVE NEGATIVE Final    Comment: (NOTE) The Xpert Xpress SARS-CoV-2/FLU/RSV plus assay is intended as an aid in the diagnosis of influenza from Nasopharyngeal swab specimens and should not be used as a sole basis for treatment. Nasal washings and aspirates are unacceptable for Xpert Xpress SARS-CoV-2/FLU/RSV testing.  Fact Sheet for Patients: EntrepreneurPulse.com.au  Fact Sheet for Healthcare Providers: IncredibleEmployment.be  This test is not yet approved or cleared by the [place] FDA and has been authorized for detection and/or diagnosis of SARS-CoV-2 by FDA under an Emergency Use  Authorization (EUA). This EUA will remain in effect (meaning this test can be used) for the duration of the COVID-19 declaration under Section 564(b)(1) of the Act, 21 U.S.C. section 360bbb-3(b)(1), unless the authorization is terminated or revoked.  Performed at [facility], [place] [address] [place], [place] [address]   MRSA Next Gen by PCR, Nasal     Status: None   Collection Time: 07/09/21 10:39 PM   Specimen: Nasal Mucosa; Nasal Swab  Result Value Ref Range Status   MRSA by PCR Next Gen NOT DETECTED NOT DETECTED Final    Comment: (NOTE) The GeneXpert MRSA Assay (FDA approved for NASAL specimens only), is one component of a comprehensive MRSA colonization surveillance program. It is not intended to diagnose MRSA infection nor to guide or monitor treatment for MRSA infections. Test performance is not FDA approved in patients less than 82 years old. Performed at [facility], [place] [address], [place], [place] [address]      Labs: BNP (last 3 results) Recent Labs    07/12/21 0318 07/13/21 0251 07/15/21 2115  BNP 582.2* 849.0* [id number]*   Basic Metabolic Panel: Recent Labs  Lab 07/11/21 0515 07/12/21 0733 07/13/21 0251 07/15/21 0905 07/16/21 0322  NA 135 139 138 137 136  K 4.1 4.2 4.0 3.7 3.5  CL 102 108 108 105 103  CO2 23 25 24 23 25   GLUCOSE 99 117* 99 126* 122*  BUN 57* 61* 48* 23 21  CREATININE 1.64* 1.23* 0.91 0.56 0.47  CALCIUM 9.0 9.1 8.8* 9.2 9.0  MG  --   --   --  1.7  --    Liver Function Tests: No results for input(s): AST, ALT, ALKPHOS, BILITOT, PROT, ALBUMIN in the last 168 hours. No results for input(s): LIPASE, AMYLASE in the last 168 hours. No results for input(s): AMMONIA in the last 168 hours. CBC: Recent Labs  Lab 07/12/21 0318 07/13/21 0251 07/14/21 1842 07/14/21 2333 07/15/21 0905  WBC 10.1 9.7 17.0* 14.2* 20.0*  HGB 8.7* 8.8* 9.8* 11.3* 10.4*  HCT 28.5* 28.8* 31.7* 36.1 32.2*  MCV 96.9 97.6 96.4  94.5 92.3  PLT 152 163 243 247 312   Cardiac Enzymes: No results for input(s): CKTOTAL, CKMB, CKMBINDEX, TROPONINI in the last 168 hours. BNP: Invalid input(s): POCBNP CBG: Recent Labs  Lab 07/15/21 [id number] 07/15/21 1113 07/15/21 1815 07/15/21 1952  07/16/21 0025  GLUCAP 106* 115* 122* 130* 116*   D-Dimer No results for input(s): DDIMER in the last 72 hours. Hgb A1c No results for input(s): HGBA1C in the last 72 hours. Lipid Profile No results for input(s): CHOL, HDL, LDLCALC, TRIG, CHOLHDL, LDLDIRECT in the last 72 hours. Thyroid function studies No results for input(s): TSH, T4TOTAL, T3FREE, THYROIDAB in the last 72 hours.  Invalid input(s): FREET3 Anemia work up No results for input(s): VITAMINB12, FOLATE, FERRITIN, TIBC, IRON, RETICCTPCT in the last 72 hours. Urinalysis    Component Value Date/Time   COLORURINE AMBER (A) 07/07/2021 0250   APPEARANCEUR CLOUDY (A) 07/07/2021 0250   APPEARANCEUR Clear 11/20/2017 0854   LABSPEC 1.018 07/07/2021 0250   PHURINE 5.0 07/07/2021 0250   GLUCOSEU NEGATIVE 07/07/2021 0250   HGBUR NEGATIVE 07/07/2021 0250   BILIRUBINUR NEGATIVE 07/07/2021 0250   BILIRUBINUR Negative 11/20/2017 [place] 07/07/2021 0250   PROTEINUR 100 (A) 07/07/2021 0250   NITRITE NEGATIVE 07/07/2021 0250   LEUKOCYTESUR LARGE (A) 07/07/2021 0250   Sepsis Labs Invalid input(s): PROCALCITONIN,  WBC,  LACTICIDVEN Microbiology Recent Results (from the past 240 hour(s))  Urine Culture     Status: Abnormal   Collection Time: 07/07/21  2:50 AM   Specimen: Urine, Clean Catch  Result Value Ref Range Status   Specimen Description   Final    URINE, CLEAN CATCH Performed at [facility], [place] [address], [place], [place] [address]    Special Requests   Final    NONE Performed at [facility], [place] [address] [place], [place] [address]    Culture >=100,000 COLONIES/mL ESCHERICHIA COLI (A)  Final   Report Status  07/09/2021 FINAL  Final   Organism ID, Bacteria ESCHERICHIA COLI (A)  Final      Susceptibility   Escherichia coli - MIC*    AMPICILLIN >=32 RESISTANT Resistant     CEFAZOLIN <=4 SENSITIVE Sensitive     CEFEPIME <=0.12 SENSITIVE Sensitive     CEFTRIAXONE <=0.25 SENSITIVE Sensitive     CIPROFLOXACIN <=0.25 SENSITIVE Sensitive     GENTAMICIN <=1 SENSITIVE Sensitive     IMIPENEM <=0.25 SENSITIVE Sensitive     NITROFURANTOIN <=16 SENSITIVE Sensitive     TRIMETH/SULFA >=320 RESISTANT Resistant     AMPICILLIN/SULBACTAM 16 INTERMEDIATE Intermediate     PIP/TAZO <=4 SENSITIVE Sensitive     * >=100,000 COLONIES/mL ESCHERICHIA COLI  Resp Panel by RT-PCR (Flu A&B, Covid) Nasopharyngeal Swab     Status: None   Collection Time: 07/07/21  7:42 AM   Specimen: Nasopharyngeal Swab; Nasopharyngeal(NP) swabs in vial transport medium  Result Value Ref Range Status   SARS Coronavirus 2 by RT PCR NEGATIVE NEGATIVE Final    Comment: (NOTE) SARS-CoV-2 target nucleic acids are NOT DETECTED.  The SARS-CoV-2 RNA is generally detectable in upper respiratory specimens during the acute phase of infection. The lowest concentration of SARS-CoV-2 viral copies this assay can detect is 138 copies/mL. A negative result does not preclude SARS-Cov-2 infection and should not be used as the sole basis for treatment or other patient management decisions. A negative result may occur with  improper specimen collection/handling, submission of specimen other than nasopharyngeal swab, presence of viral mutation(s) within the areas targeted by this assay, and inadequate number of viral copies(<138 copies/mL). A negative result must be combined with clinical observations, patient history, and epidemiological information. The expected result is Negative.  Fact Sheet for Patients:  EntrepreneurPulse.com.au  Fact Sheet for Healthcare  Providers:  IncredibleEmployment.be  This test is no t  yet approved or cleared by the [place] and  has been authorized for detection and/or diagnosis of SARS-CoV-2 by FDA under an Emergency Use Authorization (EUA). This EUA will remain  in effect (meaning this test can be used) for the duration of the COVID-19 declaration under Section 564(b)(1) of the Act, 21 U.S.C.section 360bbb-3(b)(1), unless the authorization is terminated  or revoked sooner.       Influenza A by PCR NEGATIVE NEGATIVE Final   Influenza B by PCR NEGATIVE NEGATIVE Final    Comment: (NOTE) The Xpert Xpress SARS-CoV-2/FLU/RSV plus assay is intended as an aid in the diagnosis of influenza from Nasopharyngeal swab specimens and should not be used as a sole basis for treatment. Nasal washings and aspirates are unacceptable for Xpert Xpress SARS-CoV-2/FLU/RSV testing.  Fact Sheet for Patients: EntrepreneurPulse.com.au  Fact Sheet for Healthcare Providers: IncredibleEmployment.be  This test is not yet approved or cleared by the [place] FDA and has been authorized for detection and/or diagnosis of SARS-CoV-2 by FDA under an Emergency Use Authorization (EUA). This EUA will remain in effect (meaning this test can be used) for the duration of the COVID-19 declaration under Section 564(b)(1) of the Act, 21 U.S.C. section 360bbb-3(b)(1), unless the authorization is terminated or revoked.  Performed at [facility], [place] [address], [place], [place] [address]   MRSA Next Gen by PCR, Nasal     Status: None   Collection Time: 07/09/21 10:39 PM   Specimen: Nasal Mucosa; Nasal Swab  Result Value Ref Range Status   MRSA by PCR Next Gen NOT DETECTED NOT DETECTED Final    Comment: (NOTE) The GeneXpert MRSA Assay (FDA approved for NASAL specimens only), is one component of a comprehensive MRSA colonization surveillance program. It is not intended to diagnose MRSA infection nor to guide or monitor treatment for  MRSA infections. Test performance is not FDA approved in patients less than 21 years old. Performed at [facility], [place] [address], [place], [place] [address]      Time coordinating discharge: 35 minutes  SIGNED:   Cordelia Poche, MD [facility] 07/16/2021, 2:54 PM

## 2021-07-16 NOTE — Progress Notes (Signed)
Progress Note  7 Days Post-Op  Subjective: Events of yesterday noted. At time of my exam she is comfortable, mumbling, and does not follow commands  Objective: Vital signs in last 24 hours: Temp:  [96.8 F (36 C)-97.7 F (36.5 C)] 97.3 F (36.3 C) (10/31 0700) Pulse Rate:  [31-121] 31 (10/31 0630) Resp:  [13-35] 16 (10/31 0700) BP: (112-190)/(56-137) 190/116 (10/31 0700) SpO2:  [92 %-100 %] 94 % (10/31 0630) Last BM Date: 07/15/21  Intake/Output from previous day: 10/30 0701 - 10/31 0700 In: 915.6 [P.O.:180; I.V.:735.6] Out: 1890 [Urine:1615; Stool:275] Intake/Output this shift: No intake/output data recorded.  PE: General: WD, female who is laying in bed in NAD HEENT: head is normocephalic, atraumatic.  Mouth is pink and moist Heart: regular, rate, and rhythm.  Palpable radial pulses bilaterally Lungs: Respiratory effort nonlabored on supplemental O2 via [place] Abd: soft, ND, +BS, no apparent discomfort with palpation of abdomen. Midline incision with staples intact without purulent drainage MSK: all 4 extremities are symmetrical GU: foley catheter with clear yellow urine Skin: warm and dry    Lab Results:  Recent Labs    07/14/21 2333 07/15/21 0905  WBC 14.2* 20.0*  HGB 11.3* 10.4*  HCT 36.1 32.2*  PLT 247 312    BMET Recent Labs    07/15/21 0905 07/16/21 0322  NA 137 136  K 3.7 3.5  CL 105 103  CO2 23 25  GLUCOSE 126* 122*  BUN 23 21  CREATININE 0.56 0.47  CALCIUM 9.2 9.0    PT/INR No results for input(s): LABPROT, INR in the last 72 hours. CMP     Component Value Date/Time   NA 136 07/16/2021 0322   NA 137 05/04/2014 1850   K 3.5 07/16/2021 0322   K 3.5 05/04/2014 1850   CL 103 07/16/2021 0322   CL 99 05/04/2014 1850   CO2 25 07/16/2021 0322   CO2 27 05/04/2014 1850   GLUCOSE 122 (H) 07/16/2021 0322   GLUCOSE 118 (H) 05/04/2014 1850   BUN 21 07/16/2021 0322   BUN 17 05/04/2014 1850   CREATININE 0.47 07/16/2021 0322   CREATININE 0.86  05/04/2014 1850   CALCIUM 9.0 07/16/2021 0322   CALCIUM 8.2 (L) 05/04/2014 1850   PROT 5.6 (L) 07/09/2021 0456   PROT 5.9 (L) 05/04/2014 1850   ALBUMIN 2.9 (L) 07/09/2021 0456   ALBUMIN 3.2 (L) 05/04/2014 1850   AST 30 07/09/2021 0456   AST 24 05/04/2014 1850   ALT 15 07/09/2021 0456   ALT 28 05/04/2014 1850   ALKPHOS 61 07/09/2021 0456   ALKPHOS 97 05/04/2014 1850   BILITOT 0.5 07/09/2021 0456   BILITOT 0.2 05/04/2014 1850   GFRNONAA >60 07/16/2021 0322   GFRNONAA >60 05/04/2014 1850   GFRAA >60 11/28/2019 0805   GFRAA >60 05/04/2014 1850   Lipase     Component Value Date/Time   LIPASE 26 07/09/2021 0456       Studies/Results: CT CEREBRAL PERFUSION W CONTRAST  Result Date: 07/15/2021 CLINICAL DATA:  Initial evaluation for acute stroke. EXAM: CT ANGIOGRAPHY HEAD AND NECK CT PERFUSION BRAIN TECHNIQUE: Multidetector CT imaging of the head and neck was performed using the standard protocol during bolus administration of intravenous contrast. Multiplanar CT image reconstructions and MIPs were obtained to evaluate the vascular anatomy. Carotid stenosis measurements (when applicable) are obtained utilizing NASCET criteria, using the distal internal carotid diameter as the denominator. Multiphase CT imaging of the brain was performed following IV bolus contrast injection. Subsequent parametric  perfusion maps were calculated using RAPID software. CONTRAST:  140mL OMNIPAQUE IOHEXOL 350 MG/ML SOLN COMPARISON:  Head CT from earlier the same day. FINDINGS: CTA NECK FINDINGS Aortic arch: Visualized aortic arch normal caliber with normal branch pattern. Moderate atheromatous change about the arch itself and origin of the great vessels without hemodynamically significant stenosis. Right carotid system: Right common carotid artery tortuous but widely patent to the bifurcation without stenosis. Mixed concentric plaque about the origin of the cervical right ICA without hemodynamically significant  stenosis. Right ICA tortuous but widely patent distally to the skull base without stenosis, dissection or occlusion. Left carotid system: Left CCA tortuous widely patent to the bifurcation without stenosis. Calcified plaque about the left carotid bulb/proximal left ICA without hemodynamically significant stenosis. Left ICA tortuous but widely patent distally without stenosis, dissection or occlusion. Vertebral arteries: Both vertebral arteries arise from subclavian arteries. No proximal subclavian artery stenosis. Left vertebral artery strongly dominant, with a diffusely hypoplastic right vertebral artery. Vertebral arteries tortuous but are widely patent without stenosis, dissection or occlusion. Skeleton: No discrete or worrisome osseous lesions. Diffuse osteopenia noted. Mild compression deformity involving the T5 vertebral body, somewhat age indeterminate, but favored to be chronic (series 7, image 93). Other neck: No other visible acute abnormality within the neck. Upper chest: Large bilateral pleural effusions partially visualize, partially loculated on the right. 6 mm right upper lobe nodule (series 3, image 15), indeterminate. Review of the MIP images confirms the above findings CTA HEAD FINDINGS Anterior circulation: Petrous segments patent bilaterally. Atheromatous change within the carotid siphons with no more than mild stenosis, slightly worse on the right. A1 segments patent bilaterally. Normal anterior communicating artery complex. Anterior cerebral arteries patent to their distal aspects without stenosis. M1 segments patent bilaterally. Normal MCA bifurcations. Distal left MCA branches perfused. On the right, there appears to be a proximal right M2 branch occlusion (series 5, image 91). Remainder of the right MCA branches perfused. Posterior circulation: Hypoplastic right vertebral artery largely terminates in PICA. Atheromatous change within the dominant left V4 segment with associated mild to  moderate stenosis (series 5, image 148). Both PICA patent. Basilar patent to its distal aspect without stenosis. Superior cerebral arteries patent bilaterally. Right PCA supplied via the basilar. Fetal type origin of the left PCA. PCAs irregular but remain patent to their distal aspects without stenosis. Venous sinuses: Patent allowing for timing the contrast bolus. Anatomic variants: Hypoplastic right vertebral artery terminates in PICA. Fetal type origin of the left PCA. Review of the MIP images confirms the above findings CT Brain Perfusion Findings: ASPECTS: 8 CBF (<30%) Volume: 50mL Perfusion (Tmax>6.0s) volume: 57mL Mismatch Volume: 12mL Infarction Location:Acute right MCA distribution infarct involving the right insula and overlying mid and posterior right frontal region. 14 cc surrounding ischemic penumbra. Finding corresponds with abnormality on prior CT. IMPRESSION: 1. Acute proximal right M2 branch occlusion as above. 2. Positive CT perfusion with acute right MCA distribution infarct, estimated volume 31 mL. 14 cc surrounding ischemic penumbra. 3. Moderate atherosclerotic change throughout the major arterial vasculature of the head and neck, with no other hemodynamically significant or correctable stenosis. 4. Large bilateral pleural effusions, partially loculated on the right. 5. 6 mm right upper lobe nodule, indeterminate. Non-contrast chest CT at 6-12 months is recommended. If the nodule is stable at time of repeat CT, then future CT at 18-24 months (from today's scan) is considered optional for low-risk patients, but is recommended for high-risk patients. This recommendation follows the consensus statement: Guidelines for  Management of Incidental Pulmonary Nodules Detected on CT Images: From the Fleischner Society 2017; Radiology 2017; 284:228-243. 6. Mild compression deformity involving the T5 vertebral body, somewhat age indeterminate, but favored to be chronic. Correlation with physical exam  recommended. Critical Value/emergent results were called by telephone at the time of interpretation on 07/15/2021 at 10:24 pm to provider Dr. Tonie Griffith, who verbally acknowledged these results. Electronically Signed   By: Jeannine Boga M.D.   On: 07/15/2021 22:32   DG CHEST PORT 1 VIEW  Result Date: 07/15/2021 CLINICAL DATA:  Code stroke.  Shortness of breath. EXAM: PORTABLE CHEST 1 VIEW COMPARISON:  Abdominal CT scan 07/07/2021 FINDINGS: The heart is normal in size. Moderate tortuosity and calcification of the thoracic aorta. Persistent bilateral pleural effusions, right larger than left with overlying atelectasis. Stable large hiatal hernia. IMPRESSION: Persistent bilateral pleural effusions and overlying atelectasis, right greater than left. Electronically Signed   By: Marijo Sanes M.D.   On: 07/15/2021 20:25   CT HEAD CODE STROKE WO CONTRAST  Result Date: 07/15/2021 CLINICAL DATA:  Code stroke. Initial evaluation for neuro deficit, stroke suspected. EXAM: CT HEAD WITHOUT CONTRAST TECHNIQUE: Contiguous axial images were obtained from the base of the skull through the vertex without intravenous contrast. COMPARISON:  Prior CT from 05/07/2021. FINDINGS: Brain: Generalized age-related cerebral atrophy. Patchy and confluent T2/FLAIR hypodensity about the periventricular and deep white matter both cerebral hemispheres most consistent with chronic small vessel ischemic disease, moderately advanced in nature. Few scatter remote lacunar infarcts present about the bilateral basal ganglia and corona radiata. Subtle loss of gray-white matter differentiation seen involving the right insula and overlying right frontal lobe, concerning for acute right MCA distribution infarct. No intracranial hemorrhage. No mass or midline shift. No hydrocephalus or extra-axial fluid collection. Vascular: No visible hyperdense vessel. Prominent calcified atherosclerosis present at the skull base. Skull: Scalp soft tissues and  calvarium within normal limits. Sinuses/Orbits: Globes and orbital soft tissues demonstrate no acute finding. Chronic left maxillary sinusitis. Paranasal sinuses are otherwise clear. No mastoid effusion. Other: None. ASPECTS [facility] Stroke Program Early CT Score) - Ganglionic level infarction (caudate, lentiform nuclei, internal capsule, insula, M1-M3 cortex): 6 - Supraganglionic infarction (M4-M6 cortex): 2 Total score (0-10 with 10 being normal): 8 IMPRESSION: 1. Subtle loss of gray-white matter differentiation involving the right frontal lobe and insula, concerning for evolving acute right MCA distribution infarct. No hemorrhage. 2. ASPECTS is 8. 3. Underlying age-related cerebral atrophy with moderately advanced chronic microvascular ischemic disease. Critical Value/emergent results were called by telephone at the time of interpretation on 07/15/2021 at 8:47 pm to provider [facility] , who verbally acknowledged these results. Electronically Signed   By: Jeannine Boga M.D.   On: 07/15/2021 20:52   CT ANGIO HEAD CODE STROKE  Result Date: 07/15/2021 CLINICAL DATA:  Initial evaluation for acute stroke. EXAM: CT ANGIOGRAPHY HEAD AND NECK CT PERFUSION BRAIN TECHNIQUE: Multidetector CT imaging of the head and neck was performed using the standard protocol during bolus administration of intravenous contrast. Multiplanar CT image reconstructions and MIPs were obtained to evaluate the vascular anatomy. Carotid stenosis measurements (when applicable) are obtained utilizing NASCET criteria, using the distal internal carotid diameter as the denominator. Multiphase CT imaging of the brain was performed following IV bolus contrast injection. Subsequent parametric perfusion maps were calculated using RAPID software. CONTRAST:  152mL OMNIPAQUE IOHEXOL 350 MG/ML SOLN COMPARISON:  Head CT from earlier the same day. FINDINGS: CTA NECK FINDINGS Aortic arch: Visualized aortic arch normal caliber with normal  branch  pattern. Moderate atheromatous change about the arch itself and origin of the great vessels without hemodynamically significant stenosis. Right carotid system: Right common carotid artery tortuous but widely patent to the bifurcation without stenosis. Mixed concentric plaque about the origin of the cervical right ICA without hemodynamically significant stenosis. Right ICA tortuous but widely patent distally to the skull base without stenosis, dissection or occlusion. Left carotid system: Left CCA tortuous widely patent to the bifurcation without stenosis. Calcified plaque about the left carotid bulb/proximal left ICA without hemodynamically significant stenosis. Left ICA tortuous but widely patent distally without stenosis, dissection or occlusion. Vertebral arteries: Both vertebral arteries arise from subclavian arteries. No proximal subclavian artery stenosis. Left vertebral artery strongly dominant, with a diffusely hypoplastic right vertebral artery. Vertebral arteries tortuous but are widely patent without stenosis, dissection or occlusion. Skeleton: No discrete or worrisome osseous lesions. Diffuse osteopenia noted. Mild compression deformity involving the T5 vertebral body, somewhat age indeterminate, but favored to be chronic (series 7, image 93). Other neck: No other visible acute abnormality within the neck. Upper chest: Large bilateral pleural effusions partially visualize, partially loculated on the right. 6 mm right upper lobe nodule (series 3, image 15), indeterminate. Review of the MIP images confirms the above findings CTA HEAD FINDINGS Anterior circulation: Petrous segments patent bilaterally. Atheromatous change within the carotid siphons with no more than mild stenosis, slightly worse on the right. A1 segments patent bilaterally. Normal anterior communicating artery complex. Anterior cerebral arteries patent to their distal aspects without stenosis. M1 segments patent bilaterally. Normal MCA  bifurcations. Distal left MCA branches perfused. On the right, there appears to be a proximal right M2 branch occlusion (series 5, image 91). Remainder of the right MCA branches perfused. Posterior circulation: Hypoplastic right vertebral artery largely terminates in PICA. Atheromatous change within the dominant left V4 segment with associated mild to moderate stenosis (series 5, image 148). Both PICA patent. Basilar patent to its distal aspect without stenosis. Superior cerebral arteries patent bilaterally. Right PCA supplied via the basilar. Fetal type origin of the left PCA. PCAs irregular but remain patent to their distal aspects without stenosis. Venous sinuses: Patent allowing for timing the contrast bolus. Anatomic variants: Hypoplastic right vertebral artery terminates in PICA. Fetal type origin of the left PCA. Review of the MIP images confirms the above findings CT Brain Perfusion Findings: ASPECTS: 8 CBF (<30%) Volume: 41mL Perfusion (Tmax>6.0s) volume: 6mL Mismatch Volume: 41mL Infarction Location:Acute right MCA distribution infarct involving the right insula and overlying mid and posterior right frontal region. 14 cc surrounding ischemic penumbra. Finding corresponds with abnormality on prior CT. IMPRESSION: 1. Acute proximal right M2 branch occlusion as above. 2. Positive CT perfusion with acute right MCA distribution infarct, estimated volume 31 mL. 14 cc surrounding ischemic penumbra. 3. Moderate atherosclerotic change throughout the major arterial vasculature of the head and neck, with no other hemodynamically significant or correctable stenosis. 4. Large bilateral pleural effusions, partially loculated on the right. 5. 6 mm right upper lobe nodule, indeterminate. Non-contrast chest CT at 6-12 months is recommended. If the nodule is stable at time of repeat CT, then future CT at 18-24 months (from today's scan) is considered optional for low-risk patients, but is recommended for high-risk  patients. This recommendation follows the consensus statement: Guidelines for Management of Incidental Pulmonary Nodules Detected on CT Images: From the Fleischner Society 2017; Radiology 2017; 284:228-243. 6. Mild compression deformity involving the T5 vertebral body, somewhat age indeterminate, but favored to be chronic. Correlation with  physical exam recommended. Critical Value/emergent results were called by telephone at the time of interpretation on 07/15/2021 at 10:24 pm to provider Dr. Tonie Griffith, who verbally acknowledged these results. Electronically Signed   By: Jeannine Boga M.D.   On: 07/15/2021 22:32   CT ANGIO NECK CODE STROKE  Result Date: 07/15/2021 CLINICAL DATA:  Initial evaluation for acute stroke. EXAM: CT ANGIOGRAPHY HEAD AND NECK CT PERFUSION BRAIN TECHNIQUE: Multidetector CT imaging of the head and neck was performed using the standard protocol during bolus administration of intravenous contrast. Multiplanar CT image reconstructions and MIPs were obtained to evaluate the vascular anatomy. Carotid stenosis measurements (when applicable) are obtained utilizing NASCET criteria, using the distal internal carotid diameter as the denominator. Multiphase CT imaging of the brain was performed following IV bolus contrast injection. Subsequent parametric perfusion maps were calculated using RAPID software. CONTRAST:  119mL OMNIPAQUE IOHEXOL 350 MG/ML SOLN COMPARISON:  Head CT from earlier the same day. FINDINGS: CTA NECK FINDINGS Aortic arch: Visualized aortic arch normal caliber with normal branch pattern. Moderate atheromatous change about the arch itself and origin of the great vessels without hemodynamically significant stenosis. Right carotid system: Right common carotid artery tortuous but widely patent to the bifurcation without stenosis. Mixed concentric plaque about the origin of the cervical right ICA without hemodynamically significant stenosis. Right ICA tortuous but widely patent  distally to the skull base without stenosis, dissection or occlusion. Left carotid system: Left CCA tortuous widely patent to the bifurcation without stenosis. Calcified plaque about the left carotid bulb/proximal left ICA without hemodynamically significant stenosis. Left ICA tortuous but widely patent distally without stenosis, dissection or occlusion. Vertebral arteries: Both vertebral arteries arise from subclavian arteries. No proximal subclavian artery stenosis. Left vertebral artery strongly dominant, with a diffusely hypoplastic right vertebral artery. Vertebral arteries tortuous but are widely patent without stenosis, dissection or occlusion. Skeleton: No discrete or worrisome osseous lesions. Diffuse osteopenia noted. Mild compression deformity involving the T5 vertebral body, somewhat age indeterminate, but favored to be chronic (series 7, image 93). Other neck: No other visible acute abnormality within the neck. Upper chest: Large bilateral pleural effusions partially visualize, partially loculated on the right. 6 mm right upper lobe nodule (series 3, image 15), indeterminate. Review of the MIP images confirms the above findings CTA HEAD FINDINGS Anterior circulation: Petrous segments patent bilaterally. Atheromatous change within the carotid siphons with no more than mild stenosis, slightly worse on the right. A1 segments patent bilaterally. Normal anterior communicating artery complex. Anterior cerebral arteries patent to their distal aspects without stenosis. M1 segments patent bilaterally. Normal MCA bifurcations. Distal left MCA branches perfused. On the right, there appears to be a proximal right M2 branch occlusion (series 5, image 91). Remainder of the right MCA branches perfused. Posterior circulation: Hypoplastic right vertebral artery largely terminates in PICA. Atheromatous change within the dominant left V4 segment with associated mild to moderate stenosis (series 5, image 148). Both PICA  patent. Basilar patent to its distal aspect without stenosis. Superior cerebral arteries patent bilaterally. Right PCA supplied via the basilar. Fetal type origin of the left PCA. PCAs irregular but remain patent to their distal aspects without stenosis. Venous sinuses: Patent allowing for timing the contrast bolus. Anatomic variants: Hypoplastic right vertebral artery terminates in PICA. Fetal type origin of the left PCA. Review of the MIP images confirms the above findings CT Brain Perfusion Findings: ASPECTS: 8 CBF (<30%) Volume: 73mL Perfusion (Tmax>6.0s) volume: 35mL Mismatch Volume: 51mL Infarction Location:Acute right MCA distribution infarct involving  the right insula and overlying mid and posterior right frontal region. 14 cc surrounding ischemic penumbra. Finding corresponds with abnormality on prior CT. IMPRESSION: 1. Acute proximal right M2 branch occlusion as above. 2. Positive CT perfusion with acute right MCA distribution infarct, estimated volume 31 mL. 14 cc surrounding ischemic penumbra. 3. Moderate atherosclerotic change throughout the major arterial vasculature of the head and neck, with no other hemodynamically significant or correctable stenosis. 4. Large bilateral pleural effusions, partially loculated on the right. 5. 6 mm right upper lobe nodule, indeterminate. Non-contrast chest CT at 6-12 months is recommended. If the nodule is stable at time of repeat CT, then future CT at 18-24 months (from today's scan) is considered optional for low-risk patients, but is recommended for high-risk patients. This recommendation follows the consensus statement: Guidelines for Management of Incidental Pulmonary Nodules Detected on CT Images: From the Fleischner Society 2017; Radiology 2017; 284:228-243. 6. Mild compression deformity involving the T5 vertebral body, somewhat age indeterminate, but favored to be chronic. Correlation with physical exam recommended. Critical Value/emergent results were called  by telephone at the time of interpretation on 07/15/2021 at 10:24 pm to provider Dr. Rachael Darby, who verbally acknowledged these results. Electronically Signed   By: Rise Mu M.D.   On: 07/15/2021 22:32    Anti-infectives: Anti-infectives (From admission, onward)    Start     Dose/Rate Route Frequency Ordered Stop   07/08/21 1800  cefTRIAXone (ROCEPHIN) 2 g in sodium chloride 0.9 % 100 mL IVPB  Status:  Discontinued        2 g 200 mL/hr over 30 Minutes Intravenous Every 24 hours 07/08/21 1407 07/14/21 1105   07/08/21 1500  metroNIDAZOLE (FLAGYL) IVPB 500 mg  Status:  Discontinued        500 mg 100 mL/hr over 60 Minutes Intravenous Every 12 hours 07/08/21 1407 07/14/21 1159   07/07/21 0800  ciprofloxacin (CIPRO) IVPB 400 mg  Status:  Discontinued        400 mg 200 mL/hr over 60 Minutes Intravenous 2 times daily 07/07/21 0733 07/08/21 1407   07/07/21 0730  ciprofloxacin (CIPRO) IVPB 400 mg  Status:  Discontinued        400 mg 200 mL/hr over 60 Minutes Intravenous  Once 07/07/21 0727 07/07/21 0733   07/07/21 0730  metroNIDAZOLE (FLAGYL) IVPB 500 mg        500 mg 100 mL/hr over 60 Minutes Intravenous  Once 07/07/21 0727 07/07/21 1104        Assessment/Plan Ms. Butner is an 87 year old female who presented with atrial fibrillation on eliquis who presented with abdominal pain concerning at first for cholecystitis.  Workup was negative but her pain continued and she developed peritonitic signs on exam so she was taken for: dx lap converted to ex lap with R colectomy and ileum resection on 07/09/21 by Dr. Dossie Der, for mesenteric ischemia consistent with an embolic pattern.   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 POD#7 - 90 cm of small bowel left after the ligament of treitz, anastomosis to proximal transverse colon - Pathology benign.  Embolic clots noted in mesentery of ischemic small bowel - AKI/fluid balance - Cr 0.47 today - hgb  10.4 (11.3) on heparin gtt. Continue to monitor - tolerated dysphagia 3 diet but now NPO given concern for acute CVA - fiber, iron, lomotil for loose stools - coffee ground emesis over the weekend - hep gtt initially held. On PPI - palliative following. Family/patient do  not want cortrak/TPN which is reasonable. Briefly discussed with daughter in law at bedside this am and given acute CVA son is considering comfort care  FEN: NPO ID: [email or web address] VTE: heparin gtt - okay for oral anticoagulation from surgical perspective   Remainder of care per primary team   LOS: 9 days    Winferd Humphrey, [facility] 07/16/2021, 8:30 AM Please see Amion for pager number during day hours 7:00am-4:30pm

## 2021-07-16 NOTE — TOC Transition Note (Signed)
Transition of Care [facility]) - CM/SW Discharge Note   Patient Details  Name: Debbie Hampton MRN: [id number] Date of Birth: [birth year]  Transition of Care [facility]) CM/SW Contact:  Darleene Cleaver, LCSW Phone Number: 07/16/2021, 2:27 PM   Clinical Narrative:     CSW received confirmation that patient has been approved for [facility].  CSW updated attending physician and bedside nurse.  Patient to be d/c'ed today to [facility].  Patient and family agreeable to plans will transport via ems RN to call report [phone].  Bedside nurse please call [name] when EMS arrives, his number is [phone].  CSW signing off.       Final next level of care: Hospice Medical Facility Barriers to Discharge: Barriers Resolved   Patient Goals and CMS Choice Patient states their goals for this hospitalization and ongoing recovery are:: To go to Hospice Home for end of life care. CMS Medicare.gov Compare Post Acute Care list provided to:: Patient Represenative (must comment) Choice offered to / list presented to : Adult Children  Discharge Placement                       Discharge Plan and Services In-house Referral: Clinical Social Work                                   Social Determinants of Health (SDOH) Interventions     Readmission Risk Interventions Readmission Risk Prevention Plan 07/11/2021  Transportation Screening Complete  HRI or Home Care Consult Complete  Palliative Care Screening Complete  Medication Review (RN Care Manager) Complete  Some recent data might be hidden

## 2021-07-16 NOTE — TOC Progression Note (Addendum)
Transition of Care [facility]) - Progression Note    Patient Details  Name: Norelle Runnion MRN: [id number] Date of Birth: 10-Oct-1931  Transition of Care [facility]) CM/SW Contact  Darleene Cleaver, [place] Phone Number: 07/16/2021, 10:17 AM  Clinical Narrative:     CSW was informed that patient's family have decided on comfort care, and would prefer [facility], and if they don't have any beds, then [facility] be fine.  CSW contacted [facility] via secure chat to have them review patient.  CSW awaiting on decision and bed availability.  1:30pm  CSW spoke to patient's son Nivea Wojdyla, [phone], and informed him that [facility] did not have any beds available but [facility] does, and can potentially accept her today.  CSW asked if son was in agreement to going, and he said yes, that would be fine.  CSW provided address and contact phone number for son.  CSW contacted Arna Medici at [facility] the [place], and she will review patient, and make sure she meets criteria.  She will call back after reviewing information.      Expected Discharge Plan: Skilled Nursing Facility Barriers to Discharge: Continued Medical Work up  Expected Discharge Plan and Services Expected Discharge Plan: Skilled Nursing Facility In-house Referral: Clinical Social Work     Living arrangements for the past 2 months: Skilled Nursing Facility                                       Social Determinants of Health (SDOH) Interventions    Readmission Risk Interventions Readmission Risk Prevention Plan 07/11/2021  Transportation Screening Complete  HRI or Home Care Consult Complete  Palliative Care Screening Complete  Medication Review (RN Care Manager) Complete  Some recent data might be hidden

## 2021-07-16 NOTE — Progress Notes (Signed)
Hearing aid placed in L ear by daughter-in-law. Family had previously been advised that hearing aid would be safest in a container during EMS transport, but opted to put hearing aid back in.

## 2021-07-16 NOTE — Progress Notes (Signed)
[id number]   Civil engineer, contracting [facility]) Hospital Liaison Note  Received request from Transitions of Care Manager for family interest in [facility]. Chart reviewed and spoke with TOC to confirm referral. Unfortunately [facility] is not able to offer a bed today. Transitions of care manager aware hospital liaison will follow up tomorrow or sooner if be becomes available. Please do not hesitate to call with questions or concerns. Thank you  Ardis Rowan [facility] Liaison  [phone]

## 2021-07-16 NOTE — Plan of Care (Signed)
Discussed with family in front of patient plan of care, mouth care and transfer times with some teach back displayed.  Hearing aid left in by family with belongings bag in there hands to transfer to hospice via [place] via stretcher.  Problem: Coping: Goal: Level of anxiety will decrease Outcome: Completed/Met   Problem: Elimination: Goal: Will not experience complications related to bowel motility Outcome: Completed/Met Goal: Will not experience complications related to urinary retention Outcome: Completed/Met   Problem: Pain Managment: Goal: General experience of comfort will improve Outcome: Completed/Met   Problem: Skin Integrity: Goal: Risk for impaired skin integrity will decrease Outcome: Completed/Met

## 2021-08-16 DEATH — deceased
